# Patient Record
Sex: Male | Born: 1995 | Race: Black or African American | Hispanic: No | Marital: Single | Smoking: Current some day smoker
Health system: Southern US, Community
[De-identification: ages and names within clinical notes are randomized; demographics above are authoritative.]

## PROBLEM LIST (undated history)

## (undated) DIAGNOSIS — I639 Cerebral infarction, unspecified: Secondary | ICD-10-CM

---

## 2021-02-02 ENCOUNTER — Emergency Department (HOSPITAL_COMMUNITY)
Admission: EM | Admit: 2021-02-02 | Discharge: 2021-02-02 | Disposition: A | Discharge status: Home / Self Care | Payer: Self-pay | Attending: Emergency Medicine | Admitting: Emergency Medicine

## 2021-02-02 DIAGNOSIS — R0981 Nasal congestion: Secondary | ICD-10-CM | POA: Insufficient documentation

## 2021-02-02 DIAGNOSIS — Z20822 Contact with and (suspected) exposure to covid-19: Secondary | ICD-10-CM | POA: Insufficient documentation

## 2021-02-02 DIAGNOSIS — R059 Cough, unspecified: Secondary | ICD-10-CM | POA: Insufficient documentation

## 2021-02-02 LAB — SARS CORONAVIRUS 2 (TAT 6-24 HRS): SARS Coronavirus 2: NEGATIVE

## 2021-02-02 MED ORDER — BENZONATATE 100 MG PO CAPS: 100.0000 mg | ORAL_CAPSULE | Freq: Three times a day (TID) | ORAL | 0 refills | Status: DC

## 2021-02-02 MED ORDER — ONDANSETRON 4 MG PO TBDP: 4.0000 mg | ORAL_TABLET | Freq: Three times a day (TID) | ORAL | 0 refills | Status: DC | PRN

## 2021-02-02 NOTE — Discharge Instructions (Signed)
Please drink plenty of water.  Please follow-up on the COVID test result on the MyChart app.  You may use benzonatate for cough.  I recommend warm tea and honey.  Plenty of fluids.  I prescribed you Zofran as needed for nausea.  You may return to the ER for any new or concerning symptoms.

## 2021-02-02 NOTE — ED Provider Notes (Signed)
[facility]-EMERGENCY DEPT Provider Note   CSN: [id number] Arrival date & time: 02/02/21  0536     History Chief Complaint  Patient presents with  . Cough    Roy [name] is a 25 y.o. male.  HPI Patient is 25 year old male with no past medical history non-smoker presenting today with cough and congestion for 2 days   He denies any fevers or chills.  No hemoptysis.  He states he has some aches in his chest when he coughs.  No other associate chest pain.  No shortness of breath.  No aggravating mitigating factors.  It is not keeping up at night.  He denies any lightheadedness dizziness.  No vomiting.  Occasional nausea he states he has had decreased appetite.  This may be secondary to changes in taste.     No past medical history on file.  There are no problems to display for this patient.   The histories are not reviewed yet. Please review them in the "History" navigator section and refresh this SmartLink.     No family history on file.     Home Medications Prior to Admission medications   Medication Sig Start Date End Date Taking? Authorizing Provider  benzonatate (TESSALON) 100 MG capsule Take 1 capsule (100 mg total) by mouth every 8 (eight) hours. 02/02/21  Yes Beanca Kiester S, PA  ondansetron (ZOFRAN ODT) 4 MG disintegrating tablet Take 1 tablet (4 mg total) by mouth every 8 (eight) hours as needed for nausea or vomiting. 02/02/21  Yes Gailen Shelter, PA    Allergies    Patient has no known allergies.  Review of Systems   Review of Systems  Constitutional: Negative for chills and fever.  HENT: Negative for congestion.   Eyes: Negative for pain.  Respiratory: Positive for cough. Negative for shortness of breath.   Cardiovascular: Negative for chest pain and leg swelling.  Gastrointestinal: Positive for nausea. Negative for abdominal pain and vomiting.  Genitourinary: Negative for dysuria.  Musculoskeletal: Negative for myalgias.   Skin: Negative for rash.  Neurological: Negative for dizziness and headaches.    Physical Exam Updated Vital Signs BP (!) 143/96   Pulse 85   Temp 99.5 F (37.5 C) (Oral)   Resp 18   Ht 5\' 2"  (1.575 m)   Wt 95.3 kg   SpO2 98%   BMI 38.41 kg/m   Physical Exam Vitals and nursing note reviewed.  Constitutional:      General: He is not in acute distress.    Comments: Pleasant well-appearing 25 year old.  In no acute distress.  Sitting comfortably in bed.  Able answer questions appropriately follow commands. No increased work of breathing. Speaking in full sentences.  HENT:     Head: Normocephalic and atraumatic.     Nose: Nose normal.  Eyes:     General: No scleral icterus. Cardiovascular:     Rate and Rhythm: Normal rate and regular rhythm.     Pulses: Normal pulses.     Heart sounds: Normal heart sounds.  Pulmonary:     Effort: Pulmonary effort is normal. No respiratory distress.     Breath sounds: Normal breath sounds. No wheezing.  Abdominal:     Palpations: Abdomen is soft.     Tenderness: There is no abdominal tenderness.  Musculoskeletal:     Cervical back: Normal range of motion.     Right lower leg: No edema.     Left lower leg: No edema.  Skin:  General: Skin is warm and dry.     Capillary Refill: Capillary refill takes less than 2 seconds.  Neurological:     Mental Status: He is alert. Mental status is at baseline.  Psychiatric:        Mood and Affect: Mood normal.        Behavior: Behavior normal.     ED Results / Procedures / Treatments   Labs (all labs ordered are listed, but only abnormal results are displayed) Labs Reviewed  SARS CORONAVIRUS 2 (TAT 6-24 HRS)    EKG None  Radiology No results found.  Procedures Procedures   Medications Ordered in ED Medications - No data to display  ED Course  I have reviewed the triage vital signs and the nursing notes.  Pertinent labs & imaging results that were available during my care of the  patient were reviewed by me and considered in my medical decision making (see chart for details).    MDM Rules/Calculators/A&P                          Patient is a well-appearing 25 year old male presented today with cough.  Is been present for 2 days.  He is requesting COVID test.  We will provide him with COVID test.  Recommended Tylenol ibuprofen and prescribe benzonatate.  Return precautions given.  Patient is oxygenating well, ambulatory without difficulty.  Roy [name] was evaluated in Emergency Department on 02/02/2021 for the symptoms described in the history of present illness. He was evaluated in the context of the global COVID-19 pandemic, which necessitated consideration that the patient might be at risk for infection with the SARS-CoV-2 virus that causes COVID-19. Institutional protocols and algorithms that pertain to the evaluation of patients at risk for COVID-19 are in a state of rapid change based on information released by regulatory bodies including the CDC and federal and state organizations. These policies and algorithms were followed during the patient's care in the ED.  Final Clinical Impression(s) / ED Diagnoses Final diagnoses:  Cough    Rx / DC Orders ED Discharge Orders         Ordered    benzonatate (TESSALON) 100 MG capsule  Every 8 hours        02/02/21 0829    ondansetron (ZOFRAN ODT) 4 MG disintegrating tablet  Every 8 hours PRN        02/02/21 0829           Gailen Shelter, PA 02/02/21 1610    Sabino Donovan, MD 02/03/21 [phone]

## 2021-02-02 NOTE — ED Notes (Signed)
Educated patient on fever monitoring and medication options.

## 2021-02-02 NOTE — ED Triage Notes (Signed)
Pt c/o cough and congestion x 1 day. Denies fevers. States he wants to have a covid test.

## 2021-07-26 ENCOUNTER — Encounter (HOSPITAL_COMMUNITY): Payer: Self-pay | Admitting: Emergency Medicine

## 2021-07-26 ENCOUNTER — Emergency Department (HOSPITAL_COMMUNITY): Payer: Medicaid Other

## 2021-07-26 ENCOUNTER — Other Ambulatory Visit: Payer: Self-pay

## 2021-07-26 ENCOUNTER — Inpatient Hospital Stay (HOSPITAL_COMMUNITY)
Admission: EM | Admit: 2021-07-26 | Discharge: 2021-08-05 | DRG: 023 | Disposition: A | Discharge status: Rehabilitation Facility | Payer: Medicaid Other | Attending: Neurology | Admitting: Neurology

## 2021-07-26 DIAGNOSIS — D72829 Elevated white blood cell count, unspecified: Secondary | ICD-10-CM

## 2021-07-26 DIAGNOSIS — R29810 Facial weakness: Secondary | ICD-10-CM | POA: Diagnosis present

## 2021-07-26 DIAGNOSIS — I7774 Dissection of vertebral artery: Secondary | ICD-10-CM | POA: Diagnosis present

## 2021-07-26 DIAGNOSIS — G928 Other toxic encephalopathy: Secondary | ICD-10-CM | POA: Diagnosis present

## 2021-07-26 DIAGNOSIS — E785 Hyperlipidemia, unspecified: Secondary | ICD-10-CM | POA: Diagnosis present

## 2021-07-26 DIAGNOSIS — F17210 Nicotine dependence, cigarettes, uncomplicated: Secondary | ICD-10-CM | POA: Diagnosis present

## 2021-07-26 DIAGNOSIS — Z781 Physical restraint status: Secondary | ICD-10-CM | POA: Diagnosis not present

## 2021-07-26 DIAGNOSIS — R569 Unspecified convulsions: Secondary | ICD-10-CM | POA: Diagnosis not present

## 2021-07-26 DIAGNOSIS — D72828 Other elevated white blood cell count: Secondary | ICD-10-CM | POA: Diagnosis present

## 2021-07-26 DIAGNOSIS — K5901 Slow transit constipation: Secondary | ICD-10-CM | POA: Diagnosis not present

## 2021-07-26 DIAGNOSIS — Z0189 Encounter for other specified special examinations: Secondary | ICD-10-CM

## 2021-07-26 DIAGNOSIS — G479 Sleep disorder, unspecified: Secondary | ICD-10-CM | POA: Diagnosis not present

## 2021-07-26 DIAGNOSIS — R29728 NIHSS score 28: Secondary | ICD-10-CM | POA: Diagnosis present

## 2021-07-26 DIAGNOSIS — R7401 Elevation of levels of liver transaminase levels: Secondary | ICD-10-CM | POA: Diagnosis not present

## 2021-07-26 DIAGNOSIS — G8191 Hemiplegia, unspecified affecting right dominant side: Secondary | ICD-10-CM | POA: Diagnosis present

## 2021-07-26 DIAGNOSIS — E876 Hypokalemia: Secondary | ICD-10-CM | POA: Diagnosis present

## 2021-07-26 DIAGNOSIS — I6389 Other cerebral infarction: Secondary | ICD-10-CM | POA: Diagnosis not present

## 2021-07-26 DIAGNOSIS — J9601 Acute respiratory failure with hypoxia: Secondary | ICD-10-CM | POA: Diagnosis present

## 2021-07-26 DIAGNOSIS — F1210 Cannabis abuse, uncomplicated: Secondary | ICD-10-CM | POA: Diagnosis present

## 2021-07-26 DIAGNOSIS — H5500 Unspecified nystagmus: Secondary | ICD-10-CM | POA: Diagnosis present

## 2021-07-26 DIAGNOSIS — F32A Depression, unspecified: Secondary | ICD-10-CM | POA: Diagnosis present

## 2021-07-26 DIAGNOSIS — E7800 Pure hypercholesterolemia, unspecified: Secondary | ICD-10-CM | POA: Diagnosis not present

## 2021-07-26 DIAGNOSIS — R159 Full incontinence of feces: Secondary | ICD-10-CM | POA: Diagnosis not present

## 2021-07-26 DIAGNOSIS — I10 Essential (primary) hypertension: Secondary | ICD-10-CM | POA: Diagnosis present

## 2021-07-26 DIAGNOSIS — R2689 Other abnormalities of gait and mobility: Secondary | ICD-10-CM | POA: Diagnosis not present

## 2021-07-26 DIAGNOSIS — I651 Occlusion and stenosis of basilar artery: Secondary | ICD-10-CM | POA: Diagnosis not present

## 2021-07-26 DIAGNOSIS — I6502 Occlusion and stenosis of left vertebral artery: Secondary | ICD-10-CM | POA: Diagnosis present

## 2021-07-26 DIAGNOSIS — I6340 Cerebral infarction due to embolism of unspecified cerebral artery: Secondary | ICD-10-CM | POA: Insufficient documentation

## 2021-07-26 DIAGNOSIS — F1010 Alcohol abuse, uncomplicated: Secondary | ICD-10-CM | POA: Diagnosis present

## 2021-07-26 DIAGNOSIS — Z4659 Encounter for fitting and adjustment of other gastrointestinal appliance and device: Secondary | ICD-10-CM

## 2021-07-26 DIAGNOSIS — I63433 Cerebral infarction due to embolism of bilateral posterior cerebral arteries: Secondary | ICD-10-CM | POA: Diagnosis not present

## 2021-07-26 DIAGNOSIS — G441 Vascular headache, not elsewhere classified: Secondary | ICD-10-CM | POA: Diagnosis not present

## 2021-07-26 DIAGNOSIS — Z20822 Contact with and (suspected) exposure to covid-19: Secondary | ICD-10-CM | POA: Diagnosis present

## 2021-07-26 DIAGNOSIS — R7989 Other specified abnormal findings of blood chemistry: Secondary | ICD-10-CM | POA: Diagnosis not present

## 2021-07-26 DIAGNOSIS — R4189 Other symptoms and signs involving cognitive functions and awareness: Secondary | ICD-10-CM | POA: Diagnosis not present

## 2021-07-26 DIAGNOSIS — E871 Hypo-osmolality and hyponatremia: Secondary | ICD-10-CM | POA: Diagnosis not present

## 2021-07-26 DIAGNOSIS — I6302 Cerebral infarction due to thrombosis of basilar artery: Secondary | ICD-10-CM | POA: Diagnosis not present

## 2021-07-26 DIAGNOSIS — Z6841 Body Mass Index (BMI) 40.0 and over, adult: Secondary | ICD-10-CM

## 2021-07-26 DIAGNOSIS — G40901 Epilepsy, unspecified, not intractable, with status epilepticus: Secondary | ICD-10-CM | POA: Diagnosis not present

## 2021-07-26 DIAGNOSIS — I6350 Cerebral infarction due to unspecified occlusion or stenosis of unspecified cerebral artery: Secondary | ICD-10-CM | POA: Diagnosis not present

## 2021-07-26 DIAGNOSIS — I639 Cerebral infarction, unspecified: Secondary | ICD-10-CM

## 2021-07-26 DIAGNOSIS — G9341 Metabolic encephalopathy: Secondary | ICD-10-CM | POA: Diagnosis not present

## 2021-07-26 DIAGNOSIS — D72823 Leukemoid reaction: Secondary | ICD-10-CM | POA: Diagnosis not present

## 2021-07-26 DIAGNOSIS — R4182 Altered mental status, unspecified: Secondary | ICD-10-CM | POA: Diagnosis present

## 2021-07-26 DIAGNOSIS — I6330 Cerebral infarction due to thrombosis of unspecified cerebral artery: Secondary | ICD-10-CM

## 2021-07-26 DIAGNOSIS — I6312 Cerebral infarction due to embolism of basilar artery: Secondary | ICD-10-CM | POA: Diagnosis present

## 2021-07-26 LAB — I-STAT ARTERIAL BLOOD GAS, ED
Acid-base deficit: 4 mmol/L — ABNORMAL HIGH (ref 0.0–2.0)
Bicarbonate: 22.6 mmol/L (ref 20.0–28.0)
Calcium, Ion: 1.19 mmol/L (ref 1.15–1.40)
HCT: 39 % (ref 39.0–52.0)
Hemoglobin: 13.3 g/dL (ref 13.0–17.0)
O2 Saturation: 100 %
Patient temperature: 97.6
Potassium: 2.7 mmol/L — CL (ref 3.5–5.1)
Sodium: 141 mmol/L (ref 135–145)
TCO2: 24 mmol/L (ref 22–32)
pCO2 arterial: 43 mmHg (ref 32.0–48.0)
pH, Arterial: 7.326 — ABNORMAL LOW (ref 7.350–7.450)
pO2, Arterial: 395 mmHg — ABNORMAL HIGH (ref 83.0–108.0)

## 2021-07-26 LAB — CBG MONITORING, ED: Glucose-Capillary: 128 mg/dL — ABNORMAL HIGH (ref 70–99)

## 2021-07-26 IMAGING — CT CT HEAD W/O CM
4 series · 16 of 47 positions shown, 18 images · non-contrast
Comparison: None.

CLINICAL DATA: Seizure.

EXAM:
CT HEAD WITHOUT CONTRAST
TECHNIQUE: Contiguous axial images were obtained from the base of the skull
through the vertex without intravenous contrast.

[Series 3: head wo · axial · 0.40mm/px · z∈[-205,-95]mm · 7 of 30 slices shown, 9 images]
[im 4/30  brain]
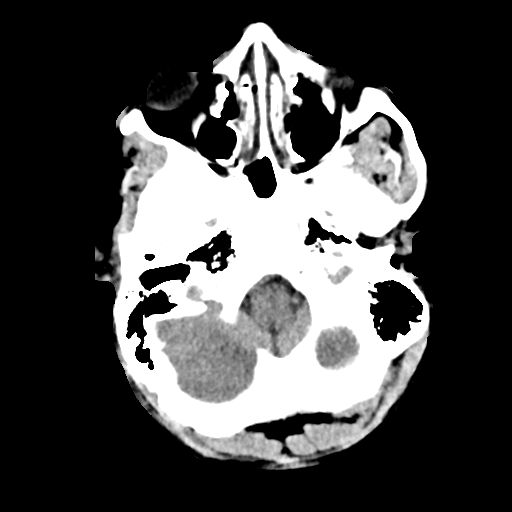
[im 4/30  bone]
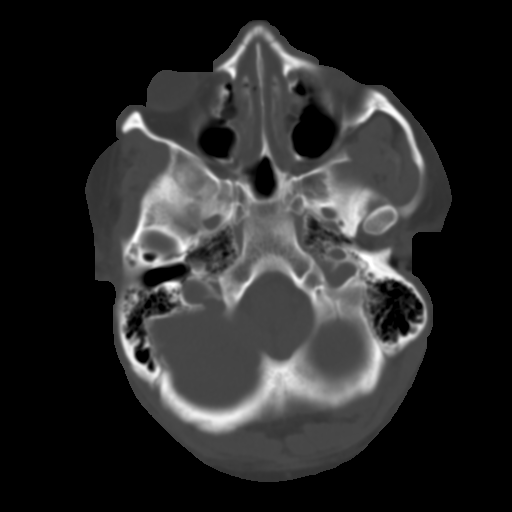
[im 8/30  brain]
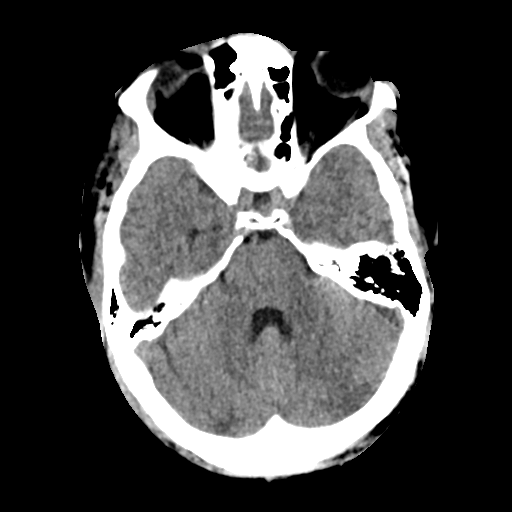
[im 11/30  brain]
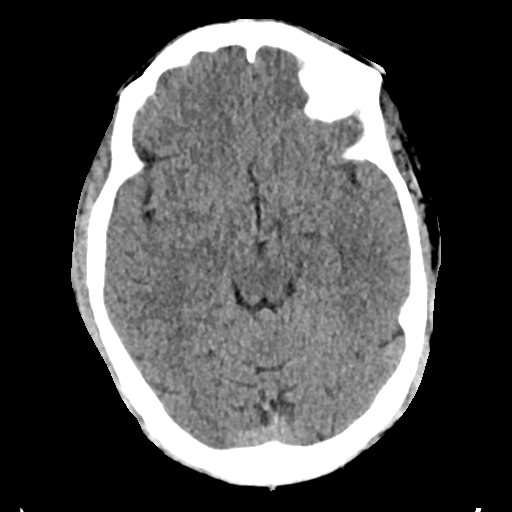
[im 15/30  brain]
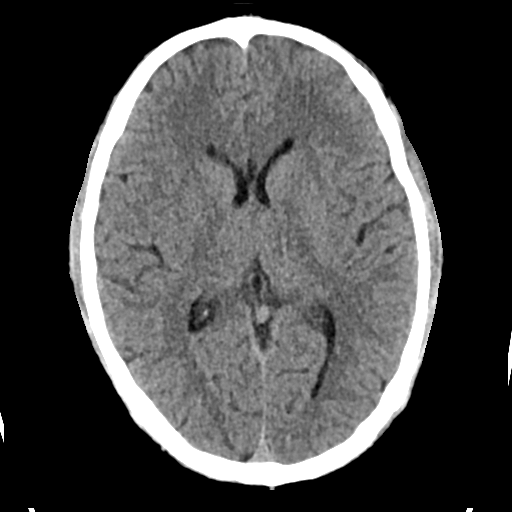
[im 19/30  brain]
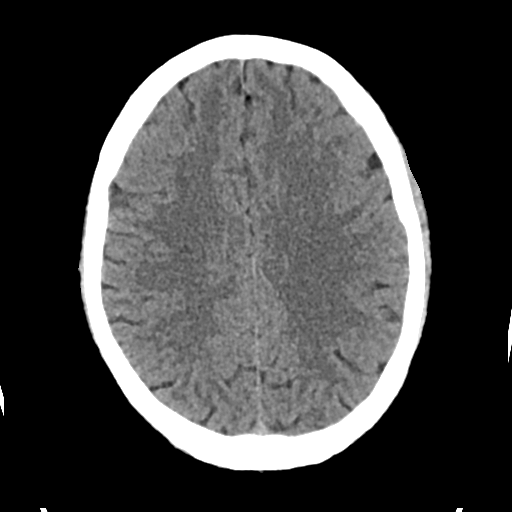
[im 19/30  bone]
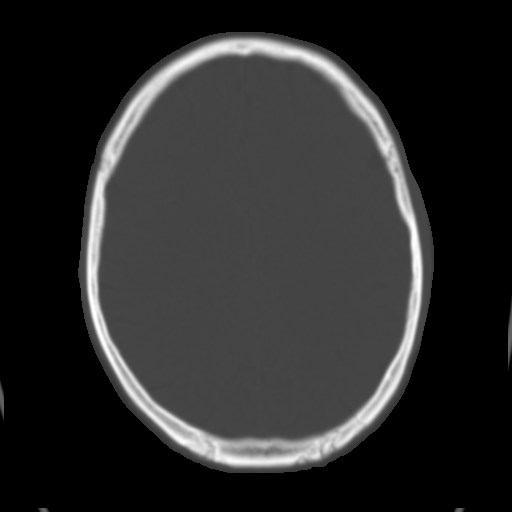
[im 22/30  brain]
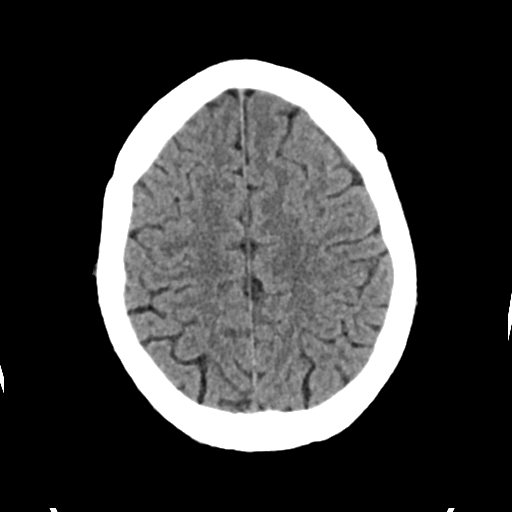
[im 26/30  brain]
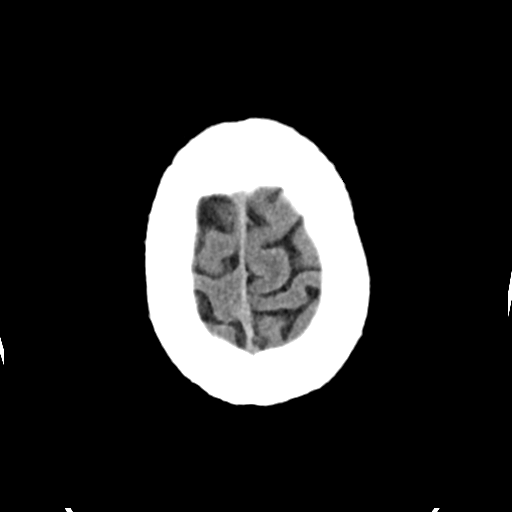

[Series 4: head bone · axial · 0.40mm/px · z∈[-206,-178]mm · 3 of 74 slices shown]
[im 8/74  bone]
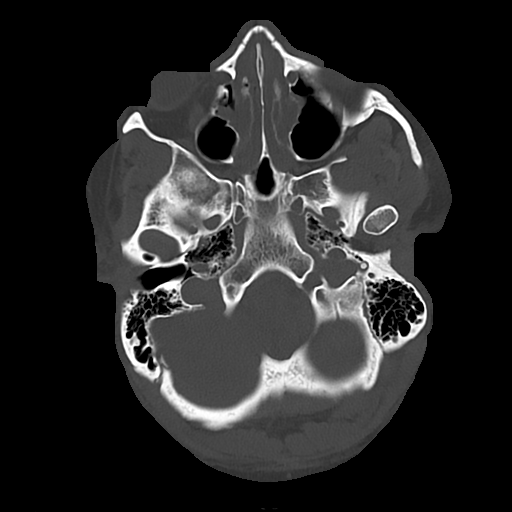
[im 15/74  bone]
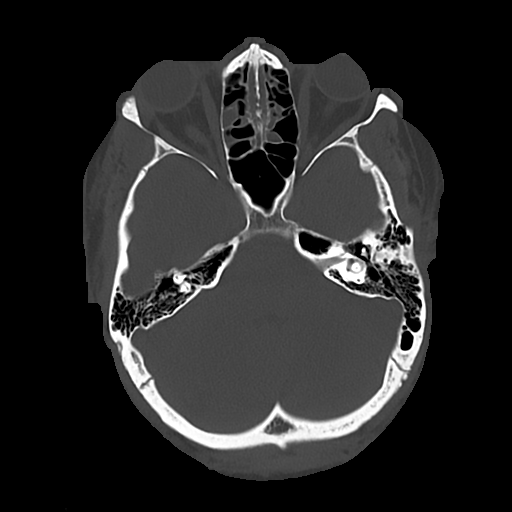
[im 22/74  bone]
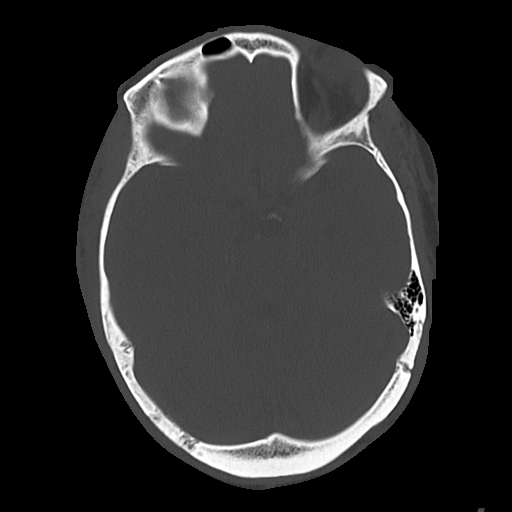

[Series 5: cor soft · coronal · 0.29mm/px · 3 of 68 slices shown]
[im 23/68  brain]
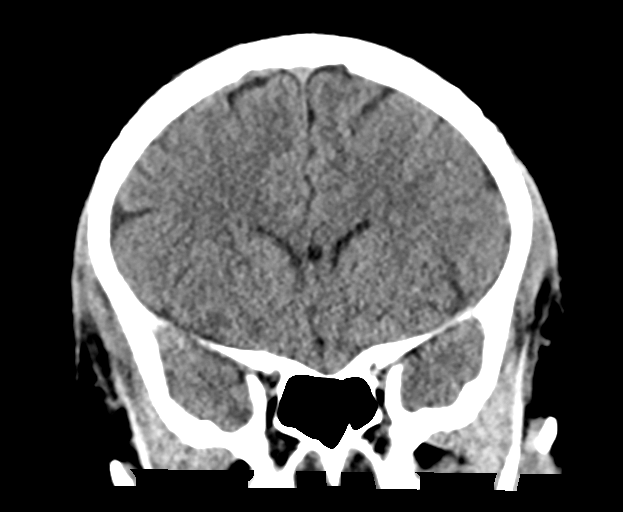
[im 30/68  brain]
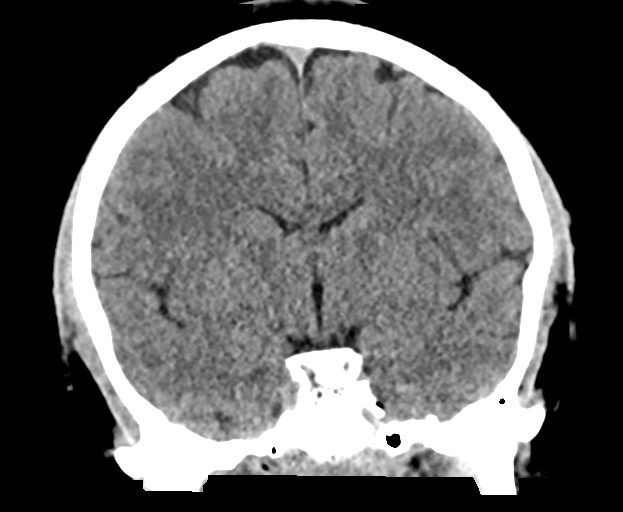
[im 38/68  brain]
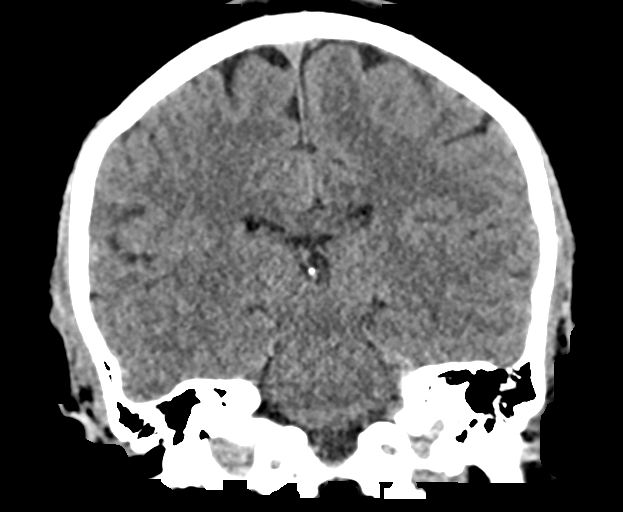

[Series 6: sag soft · sagittal · 0.29mm/px · 3 of 60 slices shown]
[im 20/60  brain]
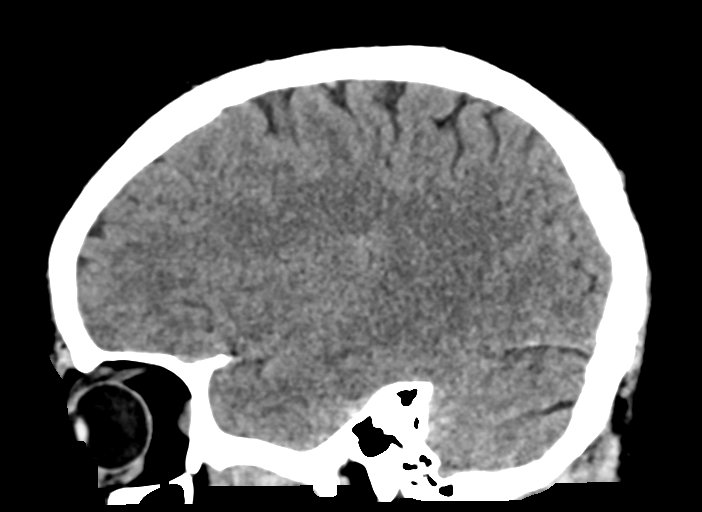
[im 30/60  brain]
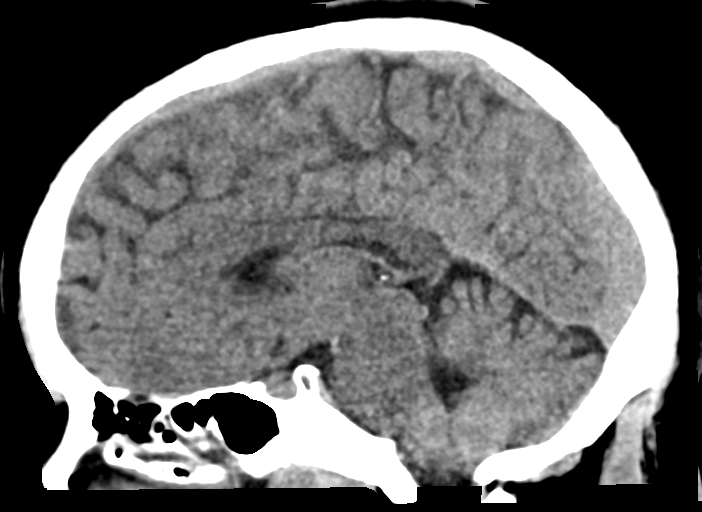
[im 40/60  brain]
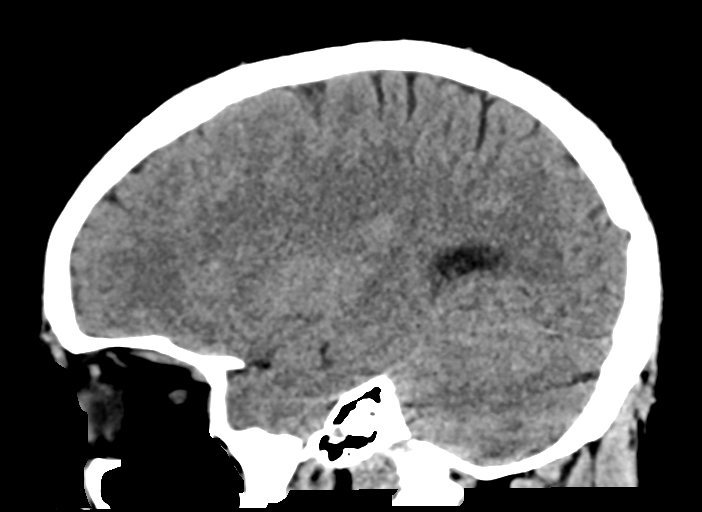

[16 of 47 positions shown; findings below may reference images not displayed]

FINDINGS: Brain: No evidence of acute infarction, hemorrhage, hydrocephalus,
extra-axial collection or mass lesion/mass effect.

Vascular: No hyperdense vessel or unexpected calcification.

Skull: Normal. Negative for fracture or focal lesion.

Sinuses/Orbits: There is mucoperiosteal thickening of paranasal
sinuses and complete opacification of the nasal passages. No
air-fluid. The mastoid air cells are clear.

Other: None
IMPRESSION: 1. Normal noncontrast CT of the brain.
2. Paranasal sinus disease.

## 2021-07-26 IMAGING — DX DG CHEST 1V PORT
1 series · 1 of 1 positions shown · non-contrast
Comparison: None.

CLINICAL DATA: Status post intubation.

EXAM:
PORTABLE CHEST 1 VIEW

[chest]
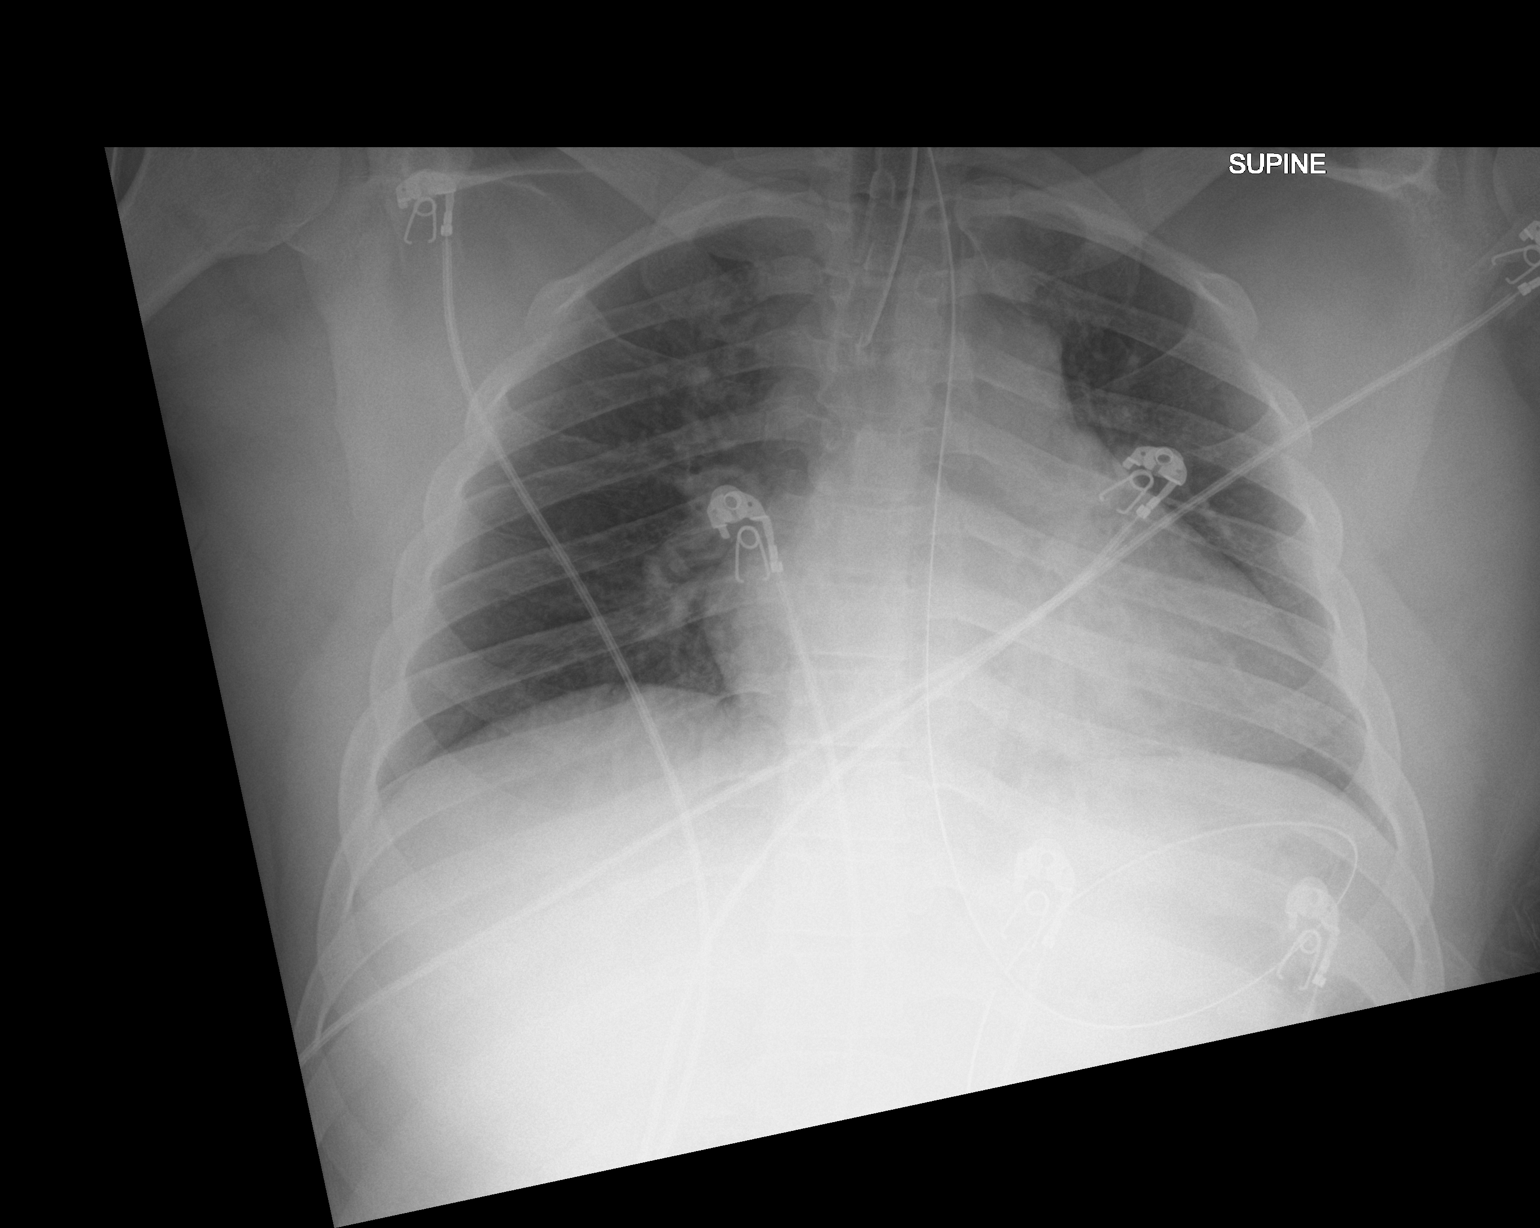

[1 of 1 positions shown; findings below may reference images not displayed]

FINDINGS: Endotracheal tube with tip approximately 2 cm above the carina.
Enteric tube extends below the diaphragm with tip beyond the
inferior margin of the image.

Mild cardiomegaly with mild central vascular congestion. No focal
consolidation, pleural effusion or pneumothorax. No acute osseous
pathology.
IMPRESSION: 1. Endotracheal tube above the carina.
2. Mild cardiomegaly with mild central vascular congestion. No focal
consolidation.

## 2021-07-26 MED ORDER — HEPARIN SODIUM (PORCINE) 5000 UNIT/ML IJ SOLN
5000.0000 [IU] | Freq: Three times a day (TID) | INTRAMUSCULAR | Status: DC
  Administered 2021-07-27 – 2021-07-31 (×13): 5000 [IU] via SUBCUTANEOUS
  Filled 2021-07-26 (×12): qty 1

## 2021-07-26 MED ORDER — LEVETIRACETAM IN NACL 1000 MG/100ML IV SOLN
1000.0000 mg | INTRAVENOUS | Status: AC
  Administered 2021-07-27: 1000 mg via INTRAVENOUS
  Filled 2021-07-26: qty 100

## 2021-07-26 MED ORDER — PANTOPRAZOLE SODIUM 40 MG IV SOLR
40.0000 mg | Freq: Every day | INTRAVENOUS | Status: DC
  Administered 2021-07-27 – 2021-07-28 (×2): 40 mg via INTRAVENOUS
  Filled 2021-07-26 (×2): qty 40

## 2021-07-26 MED ORDER — POLYETHYLENE GLYCOL 3350 17 G PO PACK: 17.0000 g | PACK | Freq: Every day | ORAL | Status: DC | PRN

## 2021-07-26 MED ORDER — LEVETIRACETAM IN NACL 1000 MG/100ML IV SOLN
1000.0000 mg | INTRAVENOUS | Status: AC
  Administered 2021-07-26: 2000 mg via INTRAVENOUS
  Filled 2021-07-26 (×2): qty 100

## 2021-07-26 MED ORDER — DOCUSATE SODIUM 50 MG/5ML PO LIQD: 100.0000 mg | Freq: Two times a day (BID) | ORAL | Status: DC | PRN

## 2021-07-26 MED ORDER — DOCUSATE SODIUM 50 MG/5ML PO LIQD
100.0000 mg | Freq: Two times a day (BID) | ORAL | Status: DC
  Administered 2021-07-27 – 2021-07-29 (×5): 100 mg
  Filled 2021-07-26 (×5): qty 10

## 2021-07-26 MED ORDER — ROCURONIUM BROMIDE 50 MG/5ML IV SOLN
INTRAVENOUS | Status: AC | PRN
  Administered 2021-07-26: 80 mg via INTRAVENOUS

## 2021-07-26 MED ORDER — POLYETHYLENE GLYCOL 3350 17 G PO PACK
17.0000 g | PACK | Freq: Every day | ORAL | Status: DC
  Administered 2021-07-27 – 2021-07-29 (×2): 17 g
  Filled 2021-07-26 (×2): qty 1

## 2021-07-26 MED ORDER — LORAZEPAM 2 MG/ML IJ SOLN
INTRAMUSCULAR | Status: AC
  Administered 2021-07-26: 2 mg
  Filled 2021-07-26: qty 2

## 2021-07-26 MED ORDER — ETOMIDATE 2 MG/ML IV SOLN
INTRAVENOUS | Status: AC | PRN
  Administered 2021-07-26: 20 mg via INTRAVENOUS

## 2021-07-26 MED ORDER — LORAZEPAM 2 MG/ML IJ SOLN
INTRAMUSCULAR | Status: AC
  Administered 2021-07-26: 2 mg
  Filled 2021-07-26: qty 1

## 2021-07-26 MED ORDER — FENTANYL 2500MCG IN NS 250ML (10MCG/ML) PREMIX INFUSION
50.0000 ug/h | INTRAVENOUS | Status: DC
  Administered 2021-07-26: 100 ug/h via INTRAVENOUS
  Administered 2021-07-28: 50 ug/h via INTRAVENOUS
  Filled 2021-07-26 (×2): qty 250

## 2021-07-26 MED ORDER — PROPOFOL 1000 MG/100ML IV EMUL
0.0000 ug/kg/min | INTRAVENOUS | Status: DC
  Administered 2021-07-26: 10 ug/kg/min via INTRAVENOUS
  Administered 2021-07-27: 50 ug/kg/min via INTRAVENOUS
  Administered 2021-07-27: 30 ug/kg/min via INTRAVENOUS
  Administered 2021-07-27: 50 ug/kg/min via INTRAVENOUS
  Administered 2021-07-28: 20 ug/kg/min via INTRAVENOUS
  Filled 2021-07-26 (×7): qty 100

## 2021-07-26 MED ORDER — LORAZEPAM 2 MG/ML IJ SOLN: 2.0000 mg | Freq: Once | INTRAMUSCULAR | Status: AC

## 2021-07-26 MED ORDER — FENTANYL BOLUS VIA INFUSION
50.0000 ug | INTRAVENOUS | Status: DC | PRN
  Filled 2021-07-26: qty 100

## 2021-07-26 MED ORDER — SODIUM CHLORIDE 0.9 % IV SOLN
INTRAVENOUS | Status: AC | PRN
  Administered 2021-07-26: 1000 mL via INTRAVENOUS

## 2021-07-26 MED ORDER — SODIUM CHLORIDE 0.9 % IV BOLUS
1000.0000 mL | Freq: Once | INTRAVENOUS | Status: AC
  Administered 2021-07-26: 1000 mL via INTRAVENOUS

## 2021-07-26 MED ORDER — SODIUM CHLORIDE 0.9 % IV SOLN: 2000.0000 mg | Freq: Once | INTRAVENOUS | Status: DC

## 2021-07-26 MED ORDER — SODIUM CHLORIDE 0.9 % IV SOLN: INTRAVENOUS | Status: DC

## 2021-07-26 MED ORDER — LORAZEPAM 2 MG/ML IJ SOLN
2.0000 mg | Freq: Once | INTRAMUSCULAR | Status: AC
  Administered 2021-07-26: 2 mg via INTRAVENOUS

## 2021-07-26 NOTE — ED Provider Notes (Signed)
[facility] EMERGENCY DEPARTMENT Provider Note   CSN: [id number] Arrival date & time: 07/26/21  2215     History Chief Complaint  Patient presents with   Altered Mental Status    Roy [name] is a 25 y.o. male.  Patient brought in by EMS.  They got a call for someone with seizure-like activity.  Was at home found in the shed in the back with smoking marijuana and drinking alcohol with his girlfriend.  She was fine.  Patient was altered EMS had seizure-like movements.  No seizure history vomited x3 according to EMS sweating profusely unable to follow commands.  They did establish an IV.  He apparently stopped on the way in so they never administered any Versed.  But then restarted upon arrival here.  Patient with very strong tonic-clonic jerking motions.  Was foaming at the mouth.  Patient was apparently fine earlier in the day according to his mother.      History reviewed. No pertinent past medical history.  There are no problems to display for this patient.   History reviewed. No pertinent surgical history.     No family history on file.     Home Medications Prior to Admission medications   Medication Sig Start Date End Date Taking? Authorizing Provider  benzonatate (TESSALON) 100 MG capsule Take 1 capsule (100 mg total) by mouth every 8 (eight) hours. 02/02/21   Gailen Shelter, PA  ondansetron (ZOFRAN ODT) 4 MG disintegrating tablet Take 1 tablet (4 mg total) by mouth every 8 (eight) hours as needed for nausea or vomiting. 02/02/21   Gailen Shelter, PA    Allergies    Patient has no known allergies.  Review of Systems   Review of Systems  Unable to perform ROS: Patient unresponsive   Physical Exam Updated Vital Signs BP 91/64 (BP Location: Right Arm)   Pulse (!) 114   Temp (!) 97 F (36.1 C) (Temporal)   Resp 20   Wt 95.3 kg   SpO2 99%   BMI 38.41 kg/m   Physical Exam Vitals and nursing note reviewed.  Constitutional:       General: He is in acute distress.     Appearance: He is well-developed. He is diaphoretic.  HENT:     Head: Normocephalic and atraumatic.  Eyes:     Conjunctiva/sclera: Conjunctivae normal.     Comments: Pupil pinpoint nonreactive  Cardiovascular:     Rate and Rhythm: Regular rhythm. Tachycardia present.     Heart sounds: No murmur heard. Pulmonary:     Effort: Pulmonary effort is normal. No respiratory distress.     Breath sounds: Normal breath sounds. No wheezing, rhonchi or rales.  Abdominal:     Palpations: Abdomen is soft.     Comments: Abdomen is soft.  Musculoskeletal:        General: No swelling.     Cervical back: Normal range of motion and neck supple.  Skin:    General: Skin is warm.  Neurological:     Comments: Patient with active tonic-clonic movement and foaming at the mouth.    ED Results / Procedures / Treatments   Labs (all labs ordered are listed, but only abnormal results are displayed) Labs Reviewed  TRIGLYCERIDES  COMPREHENSIVE METABOLIC PANEL  CBC WITH DIFFERENTIAL/PLATELET  LACTIC ACID, PLASMA  ETHANOL  RAPID URINE DRUG SCREEN, HOSP PERFORMED  URINALYSIS, ROUTINE W REFLEX MICROSCOPIC  MAGNESIUM    EKG None  Radiology No results found.  Procedures  Procedure Name: Intubation Date/Time: 07/26/2021 11:00 PM Performed by: Vanetta Mulders, MD Pre-anesthesia Checklist: Patient identified, Patient being monitored, Emergency Drugs available, Suction available and Timeout performed Oxygen Delivery Method: Ambu bag Preoxygenation: Pre-oxygenation with 100% oxygen Induction Type: Rapid sequence Ventilation: Mask ventilation without difficulty Laryngoscope Size: Glidescope and 4 Tube size: 7.5 mm Airway Equipment and Method: Rigid stylet Placement Confirmation: ETT inserted through vocal cords under direct vision, CO2 detector and Breath sounds checked- equal and bilateral Secured at: 23 cm Tube secured with: ETT holder Comments: Intubation  without any difficulty      Medications Ordered in ED Medications  levETIRAcetam (KEPPRA) IVPB 1000 mg/100 mL premix (1,000 mg Intravenous Not Given 07/26/21 2248)  docusate (COLACE) 50 MG/5ML liquid 100 mg (has no administration in time range)  polyethylene glycol (MIRALAX / GLYCOLAX) packet 17 g (has no administration in time range)  fentaNYL in NS (4mcg/ml) infusion-PREMIX (has no administration in time range)  fentaNYL (SUBLIMAZE) bolus via infusion 50-100 mcg (has no administration in time range)  propofol (DIPRIVAN) 1000 MG/100ML infusion (has no administration in time range)  0.9 %  sodium chloride infusion (has no administration in time range)  sodium chloride 0.9 % bolus 1,000 mL (has no administration in time range)  LORazepam (ATIVAN) 2 MG/ML injection (2 mg  Given 07/26/21 2225)  LORazepam (ATIVAN) injection 2 mg (2 mg Intravenous Given 07/26/21 2226)  0.9 %  sodium chloride infusion (1,000 mLs Intravenous New Bag/Given 07/26/21 2245)  etomidate (AMIDATE) injection (20 mg Intravenous Given 07/26/21 2245)  rocuronium (ZEMURON) injection (80 mg Intravenous Given 07/26/21 2246)    ED Course  I have reviewed the triage vital signs and the nursing notes.  Pertinent labs & imaging results that were available during my care of the patient were reviewed by me and considered in my medical decision making (see chart for details).    MDM Rules/Calculators/A&P                         CRITICAL CARE Performed by: Vanetta Mulders Total critical care time: 60 minutes Critical care time was exclusive of separately billable procedures and treating other patients. Critical care was necessary to treat or prevent imminent or life-threatening deterioration. Critical care was time spent personally by me on the following activities: development of treatment plan with patient and/or surrogate as well as nursing, discussions with consultants, evaluation of patient's response to  treatment, examination of patient, obtaining history from patient or surrogate, ordering and performing treatments and interventions, ordering and review of laboratory studies, ordering and review of radiographic studies, pulse oximetry and re-evaluation of patient's condition.   Initially patient's movements were quite violent.  He got 4 mg of Ativan did not make much difference.  Blood sugars were fine according to EMS.  Patient then went on and received 2 g of Keppra.  Which slowed the movements down but did not stop him.  So he was intubated with rocuronium and etomidate.  And then started on fentanyl propofol drip.  Patient will go over and get head CT.  Discussed with critical care will see him for admission.  Also discussed with the on-call neuro hospitalist.  And they will see the patient as well.  Chronic and an EEG.  Blood gas here are reassuring.  Blood sugar is in the rest of labs are still pending.   Final Clinical Impression(s) / ED Diagnoses Final diagnoses:  Status epilepticus (HCC)    Rx /  DC Orders ED Discharge Orders     None        Vanetta Mulders, MD 07/26/21 2333

## 2021-07-26 NOTE — ED Notes (Signed)
Pt attempting to get out of bed, not following commands, non-violent restraints applied.

## 2021-07-26 NOTE — H&P (Addendum)
NAME:  Roy [name], MRN:  [id number], DOB:  07/20/1996, LOS: 0 ADMISSION DATE:  07/26/2021 CONSULTATION DATE:  07/26/2021 REFERRING MD:  Deretha Emory - EDP CHIEF COMPLAINT:  AMS, ?Seizure   History of Present Illness:  25 year old man who presented to [facility] ED via [facility] 11/7 for AMS. No significant PMHx. Per chart review, patient was found altered in his shed at home and movements concerning for seizure activity were noted. Patient vomited x 3 en route with EMS. Active EtOH and THC use per family. No known history of seizure activity.  On ED arrival, patient was reportedly sweating profusely and unable to follow commands. Vitals were notable for tachycardia (117) and hypotension (91/64); SpO2 99% on RA. CBG 128. Given persistent AMS and inability to protect airway, patient was intubated in ED. CT Head was completed with NAICA. Neuro was consulted for STAT EEG and evaluation. Keppra load completed and Ativan 4mg  total administered.  PCCM consulted for admission.  Pertinent Medical History:  EtOH, marijuana use  Significant Hospital Events: Including procedures, antibiotic start and stop dates in addition to other pertinent events   11/7 - Presented to [facility] ED via [facility] for AMS. Found altered in shed at home. C/f seizure activity, vomited x 3 en route. Persistently altered/combative in ED requiring restraints and intubation for airway protection.  Interim History / Subjective:  PCCM consulted for admission  Objective:  Blood pressure 91/64, pulse (!) 114, temperature (!) 97 F (36.1 C), temperature source Temporal, resp. rate 20, weight 95.3 kg, SpO2 99 %.    Vent Mode: PRVC FiO2 (%):  [100 %] 100 % Set Rate:  [18 bmp] 18 bmp Vt Set:  [500 mL] 500 mL PEEP:  [5 cmH20] 5 cmH20 Plateau Pressure:  [20 cmH20] 20 cmH20  No intake or output data in the 24 hours ending 07/26/21 2312 Filed Weights   07/26/21 2245  Weight: 95.3 kg   Physical Examination: General: Acutely ill-appearing young  adult man in NAD. HEENT: [place]/AT, anicteric sclera, mildly injected. Pupils equal and pinpoint 58mm, sluggishly reactive (sedated, s/p intubation meds), moist mucous membranes. ETT in place. Neuro: Sedated. Does not respond to verbal, tactile or noxious stimuli. Not following commands. Spontaneous movement of extremities noted prior to sedation, none noted on my exam. CV: Mildly tachycardic to 110s, regular rhythm, no m/g/r. PULM: Breathing even and unlabored on vent (PEEP 5, FiO2 100%). Lung fields CTAB anteriorly. GI: Obese, soft, nontender, nondistended. Normoactive bowel sounds. Extremities: No LE edema noted. Skin: Warm/dry, no rashes.  Resolved Hospital Problem List:    Assessment & Plan:  Acute toxic encephalopathy, presumed 2/2 substance use Found altered at home without clear etiology; per family EtOH and THC use. - Admit to ICU for close monitoring - Correct underlying metabolic derangements as able - Thiamine, MV - F/u pending labs including standard CBC, CMP, Mg - F/u UDS, Ethanol level - F/u LA  Concern for seizure activity Concern for seizure activity noted en route with EMS; per ED RN no rhythmic motion noted but described as "spastic" movements and "foaming at the mouth". No history of seizures. CT Head 11/8 NAICA. Neuro exam difficult to obtain 11/7PM in the setting of sedating/paralytic medications for intubation. - Neuro consulted, appreciate assistance - AEDs per Neuro, s/p Keppra load - Ativan PRN - STAT EEG - Additional EEG/LTM/imaging per Neuro, if indicated  Acute hypoxemic respiratory failure secondary to encephalopathy - Continue full vent support (4-8cc/kg IBW) - Wean FiO2 for O2 sat > 90% - Daily WUA/SBT -  VAP bundle - Pulmonary hygiene - PAD protocol for sedation: Propofol and Fentanyl for goal RASS 0 to -1  Hypokalemia K 2.7 on iSTAT. 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 - Encourage cessation when appropriate from clinical standpoint  Best Practice: (right click and "Reselect all SmartList Selections" daily)   Diet/type: NPO w/ meds via tube DVT prophylaxis: prophylactic heparin  GI prophylaxis: PPI Lines: N/A Foley:  N/A Code Status:  full code Last date of multidisciplinary goals of care discussion [Pending]  Labs:  CBC: No results for input(s): WBC, NEUTROABS, HGB, HCT, MCV, PLT in the last 168 hours.  Basic Metabolic Panel: No results for input(s): NA, K, CL, CO2, GLUCOSE, BUN, CREATININE, CALCIUM, MG, PHOS in the last 168 hours. GFR: CrCl cannot be calculated (No successful lab value found.). No results for input(s): PROCALCITON, WBC, LATICACIDVEN in the last 168 hours.  Liver Function Tests: No results for input(s): AST, ALT, ALKPHOS, BILITOT, PROT, ALBUMIN in the last 168 hours. No results for input(s): LIPASE, AMYLASE in the last 168 hours. No results for input(s): AMMONIA in the last 168 hours.  ABG: No results found for: PHART, PCO2ART, PO2ART, HCO3, TCO2, ACIDBASEDEF, O2SAT   Coagulation Profile: No results for input(s): INR, PROTIME in the last 168 hours.  Cardiac Enzymes: No results for input(s): CKTOTAL, CKMB, CKMBINDEX, TROPONINI in the last 168 hours.  HbA1C: No results found for: HGBA1C  CBG: Recent Labs  Lab 07/26/21 2257  GLUCAP 128*   Review of Systems:   Patient is encephalopathic and/or intubated. Therefore history has been obtained from chart review.   Past Medical History:  He,  has no past medical history on file.   Surgical History:  History reviewed. No pertinent surgical history.   Social History:    +EtOH, THC use  Family History:  His family history is not on file.   Allergies: No Known Allergies   Home Medications: Prior to Admission medications   Medication Sig Start Date End Date Taking? Authorizing Provider  benzonatate (TESSALON) 100 MG capsule Take 1  capsule (100 mg total) by mouth every 8 (eight) hours. 02/02/21   Gailen Shelter, PA  ondansetron (ZOFRAN ODT) 4 MG disintegrating tablet Take 1 tablet (4 mg total) by mouth every 8 (eight) hours as needed for nausea or vomiting. 02/02/21   Gailen Shelter, PA     Critical care time: 42 minutes   Tim Lair, [place] [facility] 07/26/21 11:12 PM  Please see [email or web address] for pager details.  From 7A-7P if no response, please call [phone] After hours, please call ELink [phone]

## 2021-07-26 NOTE — ED Triage Notes (Signed)
Pt BIB [facility] from home, pt found altered in his shed, pt altered, per EMS had seizure like movements. No known seizure hx, vomited x 3 with EMS, pt sweating profusely, unable to follow commands, family reports pt had ETOH and weed tonight.

## 2021-07-27 ENCOUNTER — Inpatient Hospital Stay (HOSPITAL_COMMUNITY): Payer: Medicaid Other

## 2021-07-27 ENCOUNTER — Inpatient Hospital Stay (HOSPITAL_COMMUNITY): Payer: Medicaid Other | Admitting: Certified Registered"

## 2021-07-27 ENCOUNTER — Encounter (HOSPITAL_COMMUNITY)
Admission: EM | Disposition: A | Discharge status: Rehabilitation Facility | Payer: Self-pay | Source: Home / Self Care | Attending: Neurology

## 2021-07-27 DIAGNOSIS — I6330 Cerebral infarction due to thrombosis of unspecified cerebral artery: Secondary | ICD-10-CM | POA: Insufficient documentation

## 2021-07-27 DIAGNOSIS — G40901 Epilepsy, unspecified, not intractable, with status epilepticus: Secondary | ICD-10-CM

## 2021-07-27 DIAGNOSIS — I651 Occlusion and stenosis of basilar artery: Secondary | ICD-10-CM

## 2021-07-27 DIAGNOSIS — J9601 Acute respiratory failure with hypoxia: Secondary | ICD-10-CM

## 2021-07-27 DIAGNOSIS — I63433 Cerebral infarction due to embolism of bilateral posterior cerebral arteries: Secondary | ICD-10-CM

## 2021-07-27 DIAGNOSIS — I7774 Dissection of vertebral artery: Secondary | ICD-10-CM

## 2021-07-27 DIAGNOSIS — I6389 Other cerebral infarction: Secondary | ICD-10-CM

## 2021-07-27 DIAGNOSIS — R569 Unspecified convulsions: Secondary | ICD-10-CM

## 2021-07-27 DIAGNOSIS — I6340 Cerebral infarction due to embolism of unspecified cerebral artery: Secondary | ICD-10-CM | POA: Insufficient documentation

## 2021-07-27 HISTORY — PX: IR INTRA CRAN STENT: IMG2345

## 2021-07-27 HISTORY — PX: IR PERCUTANEOUS ART THROMBECTOMY/INFUSION INTRACRANIAL INC DIAG ANGIO: IMG6087

## 2021-07-27 HISTORY — PX: IR CT HEAD LTD: IMG2386

## 2021-07-27 HISTORY — PX: RADIOLOGY WITH ANESTHESIA: SHX6223

## 2021-07-27 HISTORY — PX: IR ANGIO EXTRACRAN SEL COM CAROTID INNOMINATE UNI R MOD SED: IMG5356

## 2021-07-27 LAB — COMPREHENSIVE METABOLIC PANEL
ALT: 38 U/L (ref 0–44)
AST: 38 U/L (ref 15–41)
Albumin: 3.6 g/dL (ref 3.5–5.0)
Alkaline Phosphatase: 59 U/L (ref 38–126)
Anion gap: 14 (ref 5–15)
BUN: 11 mg/dL (ref 6–20)
CO2: 18 mmol/L — ABNORMAL LOW (ref 22–32)
Calcium: 8.5 mg/dL — ABNORMAL LOW (ref 8.9–10.3)
Chloride: 107 mmol/L (ref 98–111)
Creatinine, Ser: 1.13 mg/dL (ref 0.61–1.24)
GFR, Estimated: >60 mL/min (ref >60)
Glucose, Bld: 112 mg/dL — ABNORMAL HIGH (ref 70–99)
Potassium: 3 mmol/L — ABNORMAL LOW (ref 3.5–5.1)
Sodium: 139 mmol/L (ref 135–145)
Total Bilirubin: 0.6 mg/dL (ref 0.3–1.2)
Total Protein: 6.7 g/dL (ref 6.5–8.1)

## 2021-07-27 LAB — CBC
HCT: 34.6 % — ABNORMAL LOW (ref 39.0–52.0)
Hemoglobin: 11.9 g/dL — ABNORMAL LOW (ref 13.0–17.0)
MCH: 30.7 pg (ref 26.0–34.0)
MCHC: 34.4 g/dL (ref 30.0–36.0)
MCV: 89.4 fL (ref 80.0–100.0)
Platelets: 319 10*3/uL (ref 150–400)
RBC: 3.87 MIL/uL — ABNORMAL LOW (ref 4.22–5.81)
RDW: 13.2 % (ref 11.5–15.5)
WBC: 13.9 10*3/uL — ABNORMAL HIGH (ref 4.0–10.5)
nRBC: 0 % (ref 0.0–0.2)

## 2021-07-27 LAB — URINALYSIS, ROUTINE W REFLEX MICROSCOPIC
Bilirubin Urine: NEGATIVE
Glucose, UA: NEGATIVE mg/dL
Hgb urine dipstick: NEGATIVE
Ketones, ur: NEGATIVE mg/dL
Leukocytes,Ua: NEGATIVE
Nitrite: NEGATIVE
Protein, ur: NEGATIVE mg/dL
Specific Gravity, Urine: 1.045 — ABNORMAL HIGH (ref 1.005–1.030)
pH: 5 (ref 5.0–8.0)

## 2021-07-27 LAB — I-STAT CHEM 8, ED
BUN: 11 mg/dL (ref 6–20)
Calcium, Ion: 0.88 mmol/L — CL (ref 1.15–1.40)
Chloride: 110 mmol/L (ref 98–111)
Creatinine, Ser: 1 mg/dL (ref 0.61–1.24)
Glucose, Bld: 115 mg/dL — ABNORMAL HIGH (ref 70–99)
HCT: 40 % (ref 39.0–52.0)
Hemoglobin: 13.6 g/dL (ref 13.0–17.0)
Potassium: 2.9 mmol/L — ABNORMAL LOW (ref 3.5–5.1)
Sodium: 140 mmol/L (ref 135–145)
TCO2: 18 mmol/L — ABNORMAL LOW (ref 22–32)

## 2021-07-27 LAB — POCT I-STAT 7, (LYTES, BLD GAS, ICA,H+H)
Acid-base deficit: 2 mmol/L (ref 0.0–2.0)
Bicarbonate: 22.9 mmol/L (ref 20.0–28.0)
Calcium, Ion: 1.14 mmol/L — ABNORMAL LOW (ref 1.15–1.40)
HCT: 35 % — ABNORMAL LOW (ref 39.0–52.0)
Hemoglobin: 11.9 g/dL — ABNORMAL LOW (ref 13.0–17.0)
O2 Saturation: 97 %
Patient temperature: 99.1
Potassium: 3.7 mmol/L (ref 3.5–5.1)
Sodium: 140 mmol/L (ref 135–145)
TCO2: 24 mmol/L (ref 22–32)
pCO2 arterial: 39.7 mmHg (ref 32.0–48.0)
pH, Arterial: 7.37 (ref 7.350–7.450)
pO2, Arterial: 96 mmHg (ref 83.0–108.0)

## 2021-07-27 LAB — BASIC METABOLIC PANEL
Anion gap: 9 (ref 5–15)
BUN: 9 mg/dL (ref 6–20)
CO2: 20 mmol/L — ABNORMAL LOW (ref 22–32)
Calcium: 8.1 mg/dL — ABNORMAL LOW (ref 8.9–10.3)
Chloride: 109 mmol/L (ref 98–111)
Creatinine, Ser: 1 mg/dL (ref 0.61–1.24)
GFR, Estimated: >60 mL/min (ref >60)
Glucose, Bld: 103 mg/dL — ABNORMAL HIGH (ref 70–99)
Potassium: 3.8 mmol/L (ref 3.5–5.1)
Sodium: 138 mmol/L (ref 135–145)

## 2021-07-27 LAB — CBC WITH DIFFERENTIAL/PLATELET
Abs Immature Granulocytes: 0.09 10*3/uL — ABNORMAL HIGH (ref 0.00–0.07)
Basophils Absolute: 0.1 10*3/uL (ref 0.0–0.1)
Basophils Relative: 0 %
Eosinophils Absolute: 0 10*3/uL (ref 0.0–0.5)
Eosinophils Relative: 0 %
HCT: 39.9 % (ref 39.0–52.0)
Hemoglobin: 13.2 g/dL (ref 13.0–17.0)
Immature Granulocytes: 1 %
Lymphocytes Relative: 12 %
Lymphs Abs: 2.3 10*3/uL (ref 0.7–4.0)
MCH: 30.4 pg (ref 26.0–34.0)
MCHC: 33.1 g/dL (ref 30.0–36.0)
MCV: 91.9 fL (ref 80.0–100.0)
Monocytes Absolute: 1.4 10*3/uL — ABNORMAL HIGH (ref 0.1–1.0)
Monocytes Relative: 8 %
Neutro Abs: 15 10*3/uL — ABNORMAL HIGH (ref 1.7–7.7)
Neutrophils Relative %: 79 %
Platelets: 179 10*3/uL (ref 150–400)
RBC: 4.34 MIL/uL (ref 4.22–5.81)
RDW: 13 % (ref 11.5–15.5)
WBC: 19 10*3/uL — ABNORMAL HIGH (ref 4.0–10.5)
nRBC: 0 % (ref 0.0–0.2)

## 2021-07-27 LAB — RAPID URINE DRUG SCREEN, HOSP PERFORMED
Amphetamines: NOT DETECTED
Barbiturates: NOT DETECTED
Benzodiazepines: NOT DETECTED
Cocaine: NOT DETECTED
Opiates: NOT DETECTED
Tetrahydrocannabinol: POSITIVE — AB

## 2021-07-27 LAB — PHOSPHORUS: Phosphorus: 4.1 mg/dL (ref 2.5–4.6)

## 2021-07-27 LAB — ECHOCARDIOGRAM COMPLETE
AR max vel: 2.04 cm2
AV Area VTI: 2.09 cm2
AV Area mean vel: 2.18 cm2
AV Mean grad: 4 mmHg
AV Peak grad: 8.1 mmHg
Ao pk vel: 1.42 m/s
Height: 62 in
S' Lateral: 2.9 cm
Weight: 3360 oz

## 2021-07-27 LAB — ETHANOL: Alcohol, Ethyl (B): <10 mg/dL (ref <10)

## 2021-07-27 LAB — HEMOGLOBIN A1C
Hgb A1c MFr Bld: 5.7 % — ABNORMAL HIGH (ref 4.8–5.6)
Mean Plasma Glucose: 116.89 mg/dL

## 2021-07-27 LAB — AMMONIA: Ammonia: 58 umol/L — ABNORMAL HIGH (ref 9–35)

## 2021-07-27 LAB — LACTIC ACID, PLASMA
Lactic Acid, Venous: 4.4 mmol/L (ref 0.5–1.9)
Lactic Acid, Venous: 2.8 mmol/L (ref 0.5–1.9)

## 2021-07-27 LAB — GLUCOSE, CAPILLARY
Glucose-Capillary: 87 mg/dL (ref 70–99)
Glucose-Capillary: 97 mg/dL (ref 70–99)
Glucose-Capillary: 88 mg/dL (ref 70–99)
Glucose-Capillary: 80 mg/dL (ref 70–99)
Glucose-Capillary: 101 mg/dL — ABNORMAL HIGH (ref 70–99)

## 2021-07-27 LAB — MRSA NEXT GEN BY PCR, NASAL: MRSA by PCR Next Gen: NOT DETECTED

## 2021-07-27 LAB — FOLATE: Folate: 9.8 ng/mL (ref >5.9)

## 2021-07-27 LAB — TSH: TSH: 0.756 u[IU]/mL (ref 0.350–4.500)

## 2021-07-27 LAB — RESP PANEL BY RT-PCR (FLU A&B, COVID) ARPGX2
Influenza A by PCR: NEGATIVE
Influenza B by PCR: NEGATIVE
SARS Coronavirus 2 by RT PCR: NEGATIVE

## 2021-07-27 LAB — TRIGLYCERIDES: Triglycerides: 151 mg/dL — ABNORMAL HIGH (ref <150)

## 2021-07-27 LAB — RPR: RPR Ser Ql: NONREACTIVE

## 2021-07-27 LAB — MAGNESIUM
Magnesium: 1.7 mg/dL (ref 1.7–2.4)
Magnesium: 1.9 mg/dL (ref 1.7–2.4)

## 2021-07-27 LAB — HIV ANTIBODY (ROUTINE TESTING W REFLEX): HIV Screen 4th Generation wRfx: NONREACTIVE

## 2021-07-27 LAB — VITAMIN B12: Vitamin B-12: 288 pg/mL (ref 180–914)

## 2021-07-27 IMAGING — DX DG CHEST 1V PORT
1 series · 1 of 1 positions shown · non-contrast
Comparison: Chest radiograph [DATE]

CLINICAL DATA: Encounter for intubation

EXAM:
PORTABLE CHEST 1 VIEW

[chest]
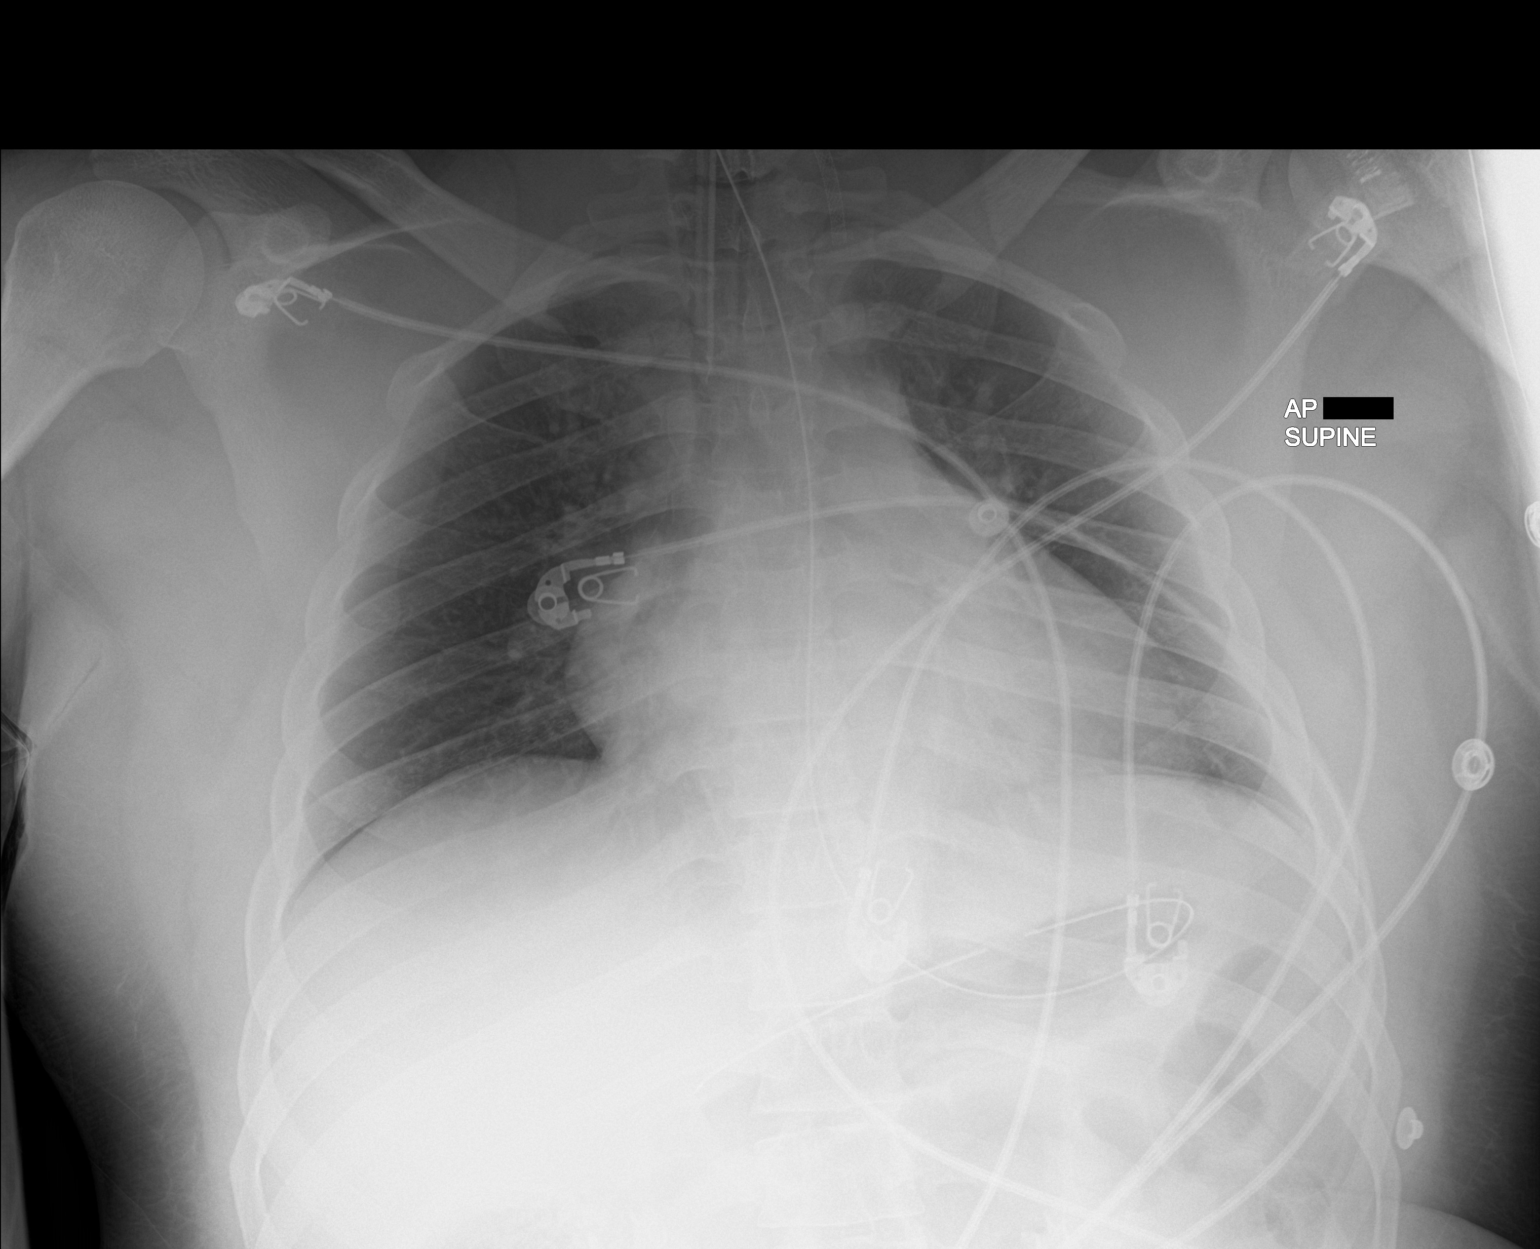

[1 of 1 positions shown; findings below may reference images not displayed]

FINDINGS: Endotracheal tube overlies the midthoracic trachea. Enteric tube tip
and side port overlie the stomach. There is a new left neck stent.
Unchanged cardiomediastinal silhouette. No focal airspace
consolidation. No large pleural effusion or visible pneumothorax.
There is no acute osseous abnormality.
IMPRESSION: Endotracheal tube overlies the midthoracic trachea.

No new airspace disease.

## 2021-07-27 IMAGING — CT CT ANGIO HEAD
2 of 7 series · 8 of 33 positions shown · IV contrast (omnipaque)
Comparison: Prior noncontrast head CT from [DATE]./

CLINICAL DATA: Initial evaluation for acute altered mental status,
evaluate for basilar thrombus.

EXAM:
CT ANGIOGRAPHY HEAD AND NECK WITH CONTRAST
CT VENOGRAM HEAD WITH CONTRAST
TECHNIQUE: Multidetector CT imaging of the head and neck was performed using
the standard protocol during bolus administration of intravenous
contrast. Multiplanar CT image reconstructions and MIPs were
obtained to evaluate the vascular anatomy. Carotid stenosis
measurements (when applicable) are obtained utilizing NASCET
criteria, using the distal internal carotid diameter as the
denominator.
CONTRAST:  60mL OMNIPAQUE IOHEXOL 350 MG/ML SOLN

[Series 3: cta neck · axial · 0.48mm/px · z∈[+288,+402]mm · 2 of 172 slices shown]
[im 58/172  soft-tissue]
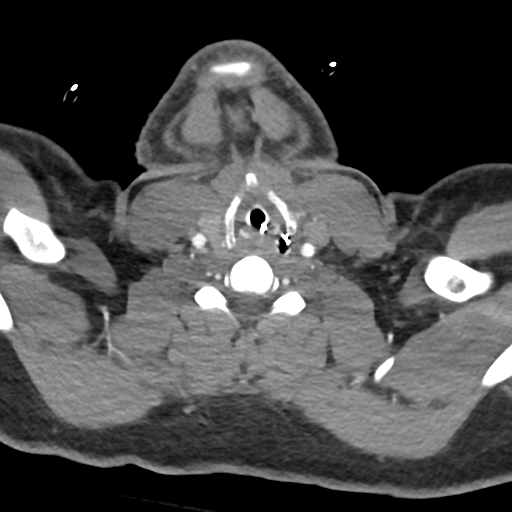
[im 115/172  soft-tissue]
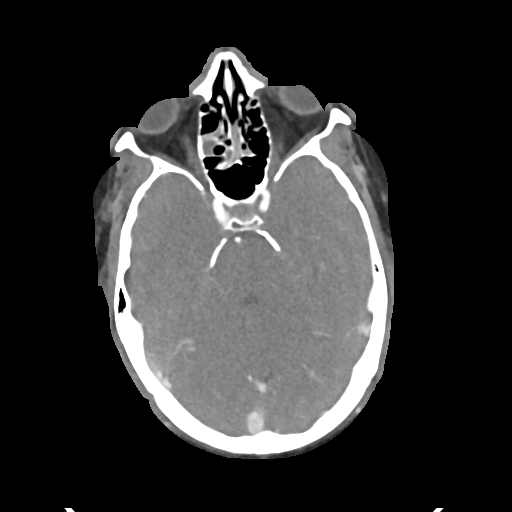

[Series 5: cta neck axial · axial · 0.39mm/px · z∈[+209,+453]mm · 6 of 343 slices shown]
[im 49/343  soft-tissue]
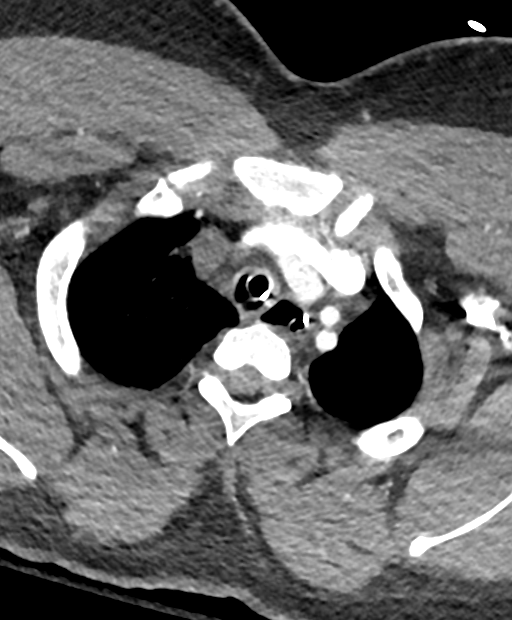
[im 98/343  bone]
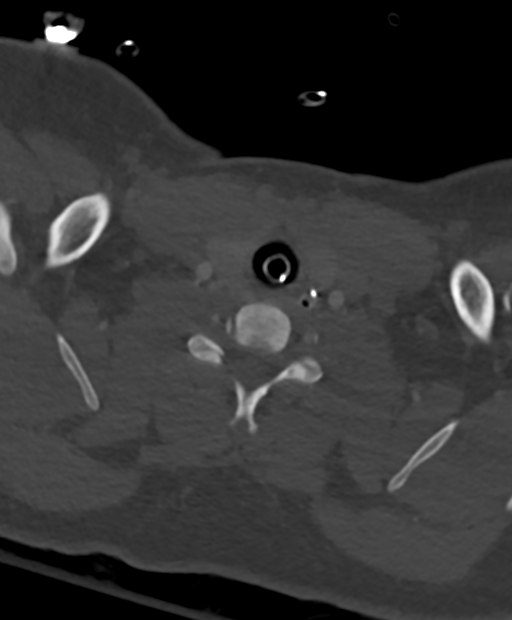
[im 147/343  soft-tissue]
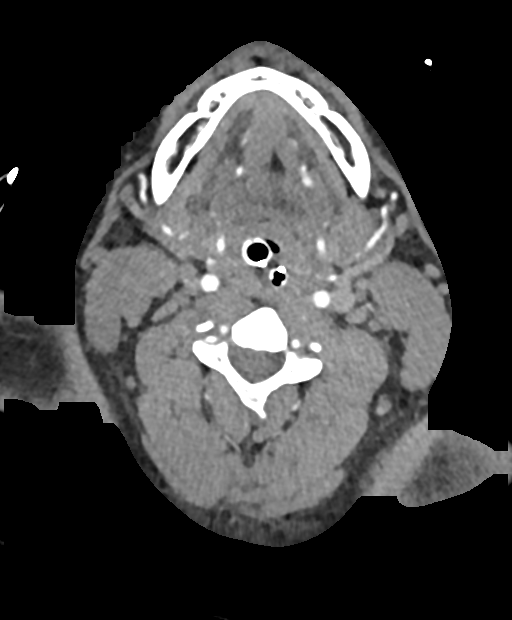
[im 196/343  bone]
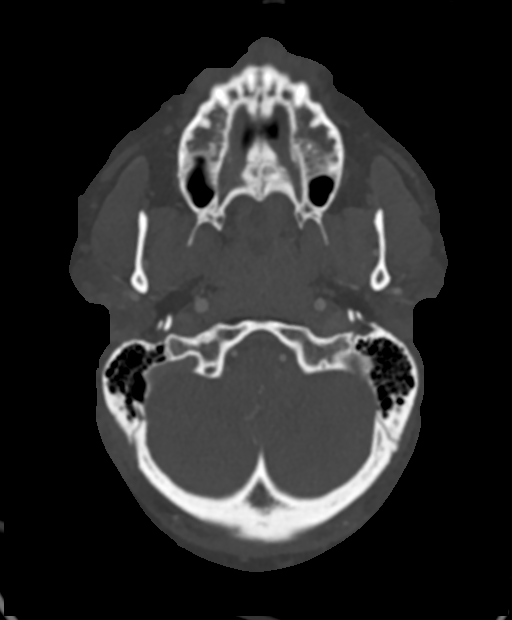
[im 245/343  soft-tissue]
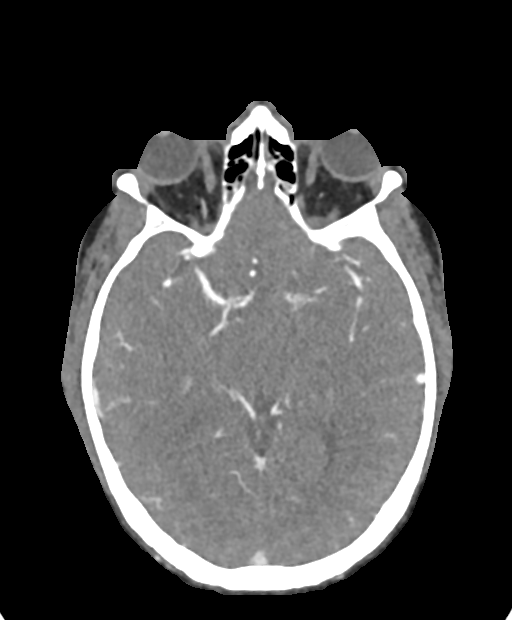
[im 294/343  bone]
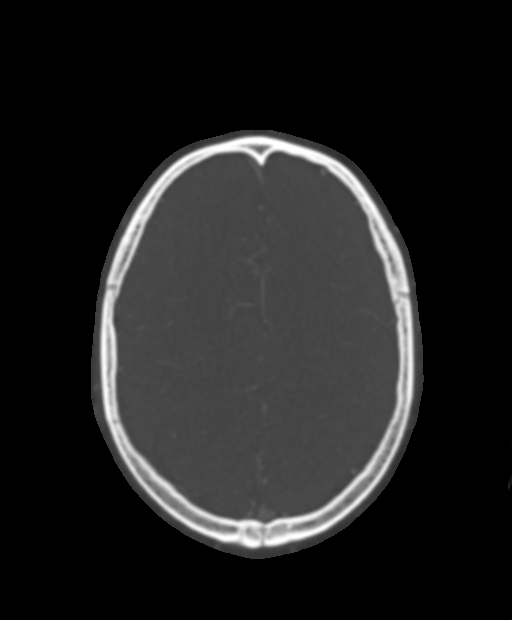

[8 of 33 positions shown; findings below may reference images not displayed]

FINDINGS: CTA NECK FINDINGS

Aortic arch: Visualized aortic arch normal in caliber with normal
branch pattern. No stenosis or other abnormality about the origin of
the great vessels.

Right carotid system: Right common and internal carotid arteries
widely patent without stenosis, dissection or occlusion.

Left carotid system: Left common and internal carotid arteries
widely patent without stenosis, dissection or occlusion.

Vertebral arteries: Both vertebral arteries arise from the
subclavian arteries. No proximal subclavian artery stenosis. Right
vertebral artery widely patent within the neck without stenosis or
other abnormality. On the left, there is intimal irregularity with
intraluminal filling defect involving the proximal left V1 segment,
concerning for acute dissection (series 7, image 130). Area of
involvement begins at or just beyond the origin, and extends
approximately 2.3 cm in length. Associated moderate to severe
stenosis proximally (series 5, image 271). Distally, the left
vertebral artery is otherwise widely patent to the skull base.

Skeleton: No visible acute osseous finding. No discrete or worrisome
osseous lesions.

Other neck: No other acute soft tissue abnormality within the neck.
Endotracheal and enteric tubes in place.

Upper chest: Mild atelectatic changes noted dependently within the
visualized lungs. Visualized upper chest demonstrates no other acute
finding.

Review of the MIP images confirms the above findings

CTA HEAD FINDINGS

Anterior circulation: Both internal carotid arteries widely patent
to the termini without stenosis. A1 segments widely patent. Normal
anterior communicating artery complex. Both anterior cerebral
arteries widely patent to their distal aspects without stenosis. No
M1 stenosis or occlusion. Normal MCA bifurcations. Distal MCA
branches well perfused and symmetric.

Posterior circulation: Both V4 segments patent to the
vertebrobasilar junction without stenosis or other abnormality. Both
PICA origins patent and normal. Basilar widely patent proximally.
There is some a subtle irregular filling defect involving the distal
basilar artery/basilar tip, concerning for intraluminal thrombus
(series 7, image 128). Secondary mild to moderate narrowing at the
basilar tip. Neither superior cerebellar artery well visualized at
the origin., and could be occluded. Irregular partially occlusive
filling defect seen involving the left P1 segment with resultant
moderate to severe stenosis (series 7, image 129). Left PCA
otherwise widely patent distally. On the right, there is acute
occlusion at the distal right P2 segment (series 7, image 141).
Perfusion within right PCA branches distally could be related to
subocclusive thrombus and/or collateralization.

Venous sinuses: Dedicated venogram images were performed. Normal
enhancement seen throughout the superior sagittal sinus to the level
of the torcula. Transverse and sigmoid sinuses are patent
bilaterally. Proximal internal jugular veins patent. Straight sinus,
vein of MSRILYN, internal cerebral veins, and basal veins of MSRILYN
are patent. No visible abnormality about the cavernous sinus.
Superior orbital veins grossly symmetric and within normal limits.
No evidence for dural sinus thrombosis. No appreciable cortical
venous thrombosis.

Anatomic variants: None significant.  No aneurysm.

Review of the MIP images confirms the above findings
IMPRESSION: 1. Intimal irregularity with filling defect involving the proximal
left vertebral artery, V1 segment, concerning for acute arterial
dissection.
2. Subtle irregular nonocclusive filling defect involving the distal
basilar artery/basilar tip, consistent with associated distal
embolic disease. Adjacent superior cerebellar arteries are not
visualized at their origins, and could be occluded.
3. Associated partially occlusive thrombus with severe stenosis
involving the left P1 segment.
4. Additional distal right P2 embolic occlusion.
5. Wide patency of both carotid artery systems and anterior
circulation.
6. Negative CT venogram.  No evidence for dural sinus thrombosis.

Critical Value/emergent results were called by telephone at the time
of interpretation on [DATE] at [DATE] to provider MSRILYN
MSRILYN , who verbally acknowledged these results.

## 2021-07-27 IMAGING — DX DG ABD PORTABLE 1V
1 series · 1 of 1 positions shown · non-contrast
Comparison: None.

CLINICAL DATA: NG tube placement

EXAM:
PORTABLE ABDOMEN - 1 VIEW

[abdomen kub]
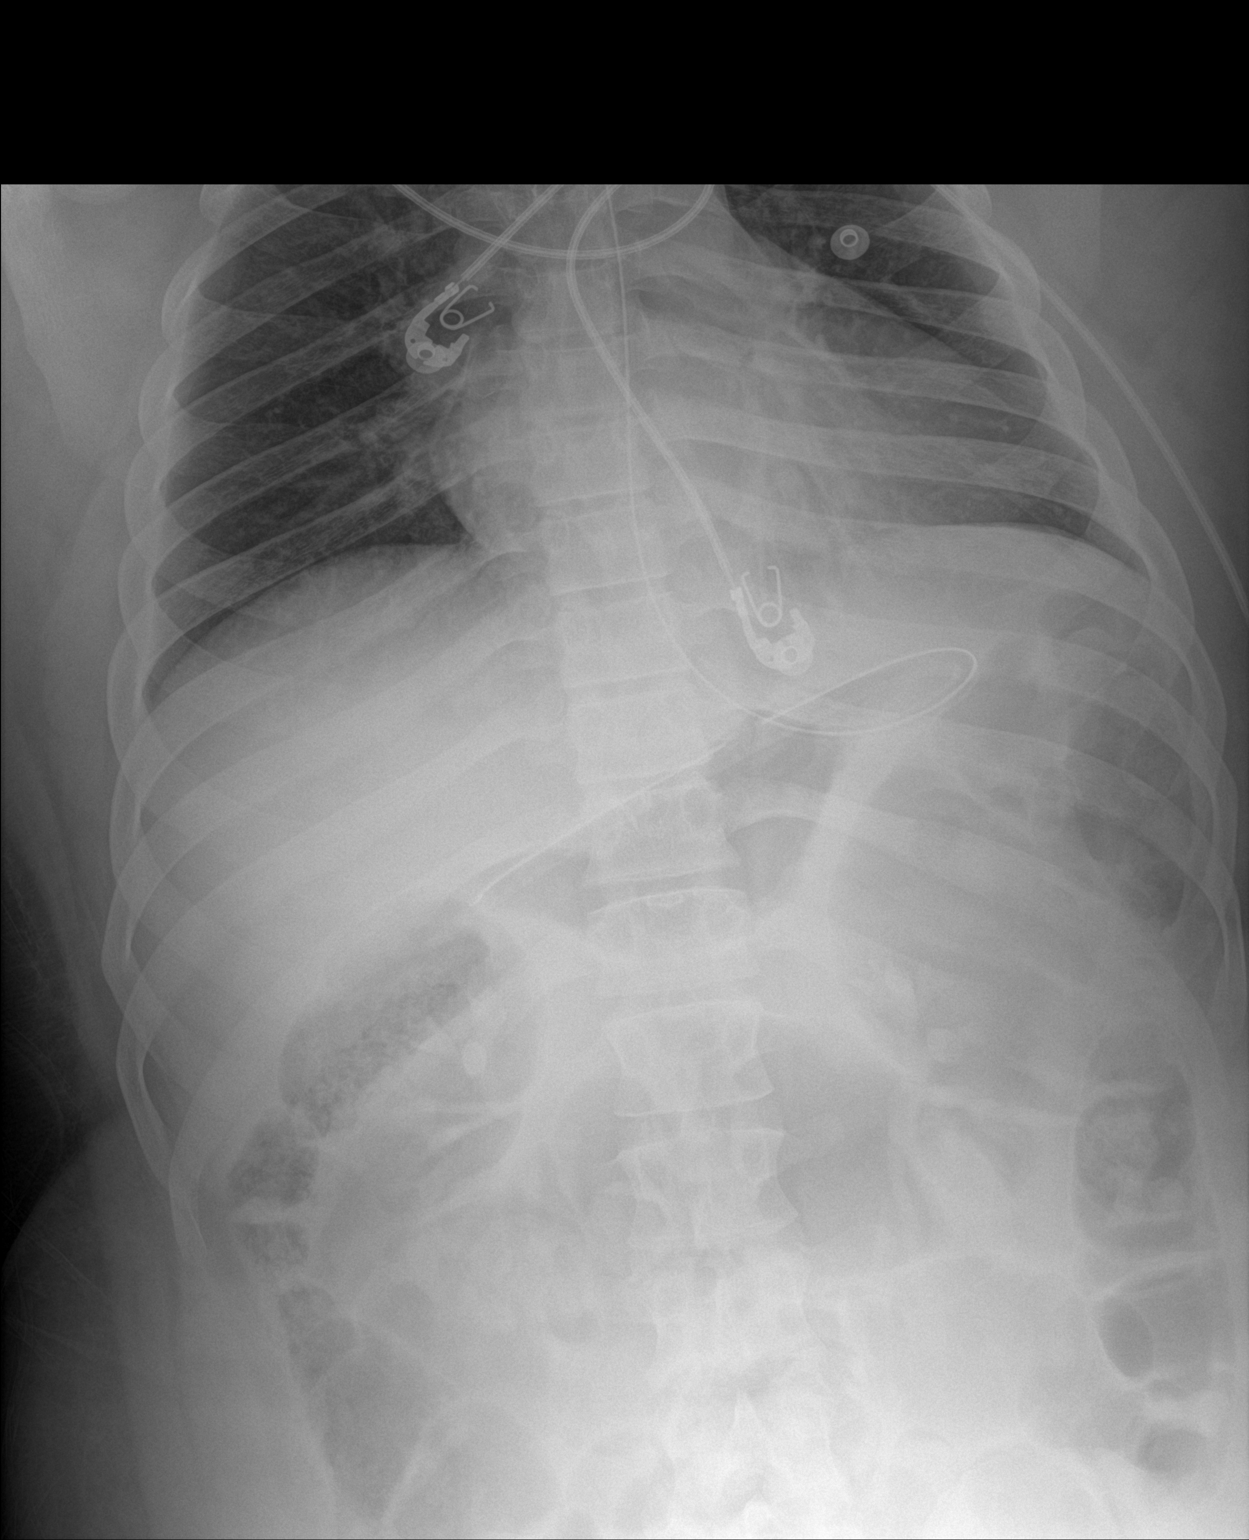

[1 of 1 positions shown; findings below may reference images not displayed]

FINDINGS: Endotracheal tube overlies the midthoracic trachea. Nasogastric tube
side port overlies the body of the stomach, tip overlying the distal
stomach. There is gas distention of bowel in the abdomen.
IMPRESSION: Nasogastric tube tip and side port overlie the stomach.

Gaseous distension of bowel in the abdomen.

## 2021-07-27 IMAGING — DX DG PELVIS 1-2V
1 series · 1 of 1 positions shown · non-contrast
Comparison: CT angiography head [DATE]

CLINICAL DATA: Stroke

EXAM:
PELVIS - 1-2 VIEW

[pelvis]
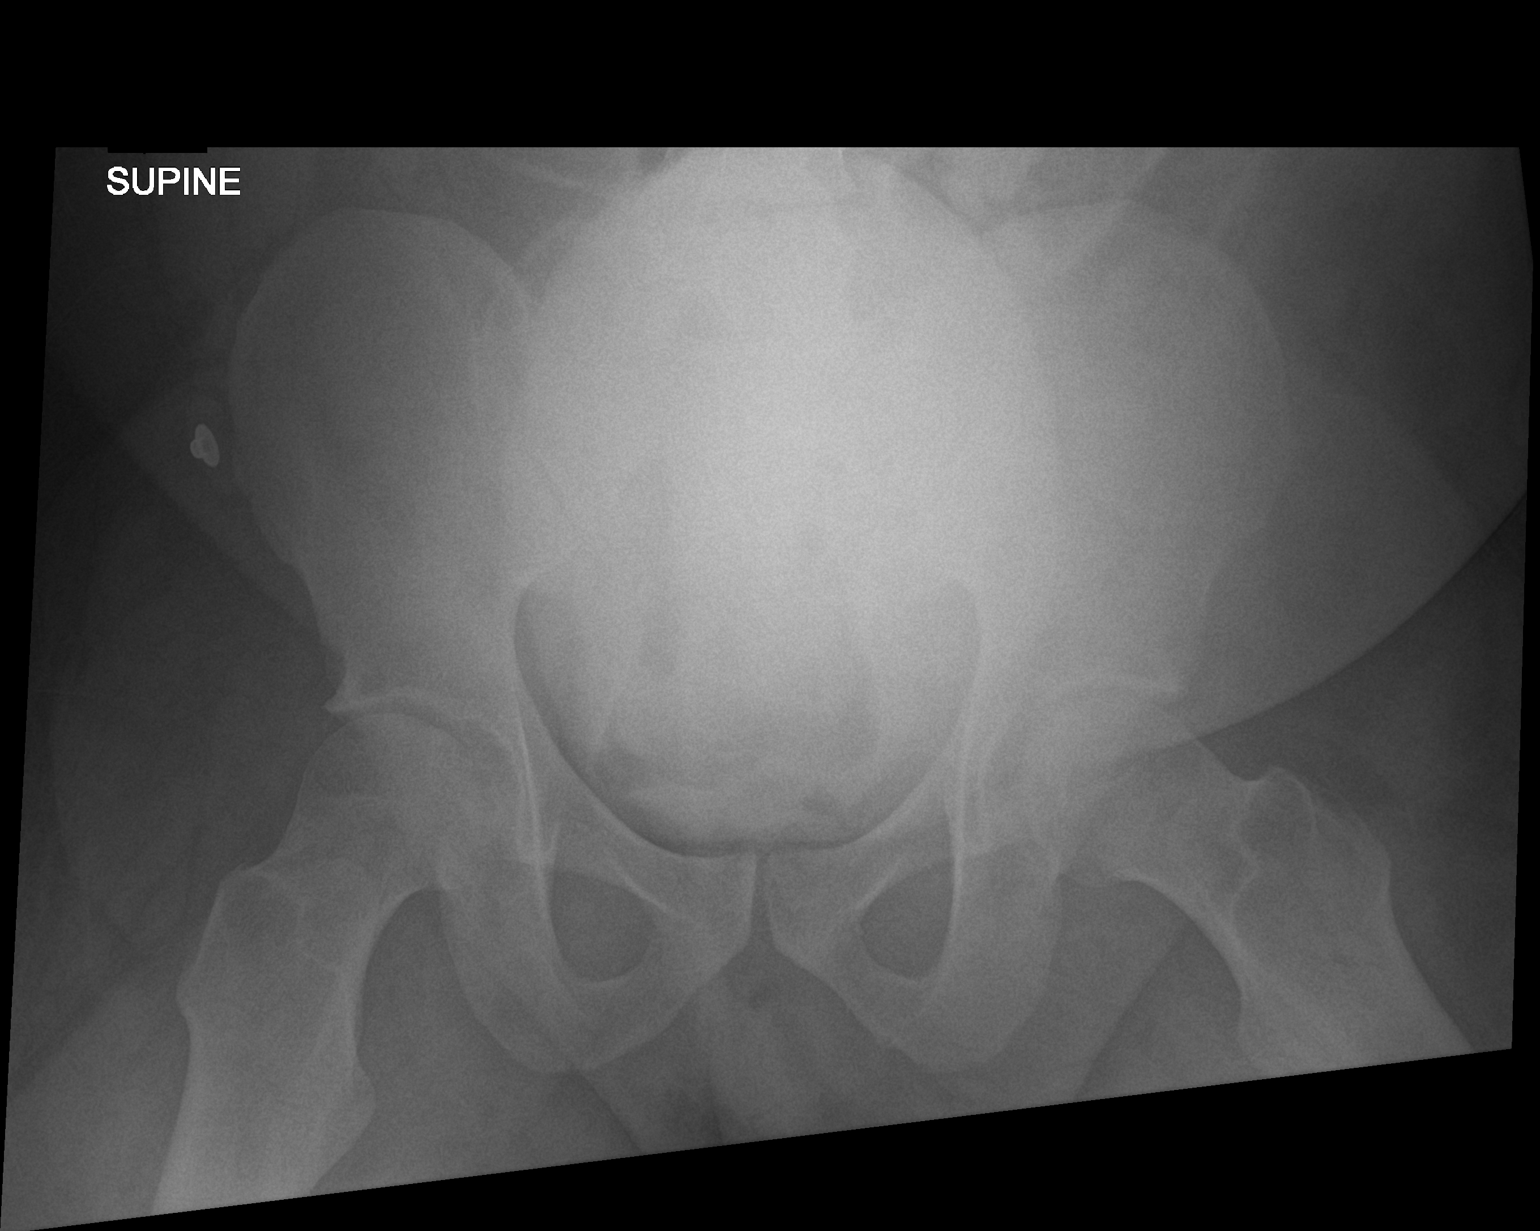

[1 of 1 positions shown; findings below may reference images not displayed]

FINDINGS: Approximally 17 cm oval opacity overlying the pelvis likely
represents a contrast filled urinary bladder due to recent
intravenous contrast administration.

Markedly limited evaluation due to overlapping osseous structures
and overlying soft tissues. There is no evidence of pelvic fracture
or diastasis. No pelvic bone lesions are seen.
IMPRESSION: Urinary bladder lumen filled with excreted intravenous contrast.

## 2021-07-27 IMAGING — CT CT VENOGRAM HEAD
1 of 6 series · 12 of 33 positions shown · IV contrast (OMNI 350)
Comparison: Prior noncontrast head CT from [DATE]./

CLINICAL DATA: Initial evaluation for acute altered mental status,
evaluate for basilar thrombus.

EXAM:
CT ANGIOGRAPHY HEAD AND NECK WITH CONTRAST
CT VENOGRAM HEAD WITH CONTRAST
TECHNIQUE: Multidetector CT imaging of the head and neck was performed using
the standard protocol during bolus administration of intravenous
contrast. Multiplanar CT image reconstructions and MIPs were
obtained to evaluate the vascular anatomy. Carotid stenosis
measurements (when applicable) are obtained utilizing NASCET
criteria, using the distal internal carotid diameter as the
denominator.
CONTRAST:  60mL OMNIPAQUE IOHEXOL 350 MG/ML SOLN

[Series 4: head delay · axial · delayed · 0.44mm/px · z∈[+395,+525]mm · 12 of 32 slices shown]
[im 3/32  soft-tissue]
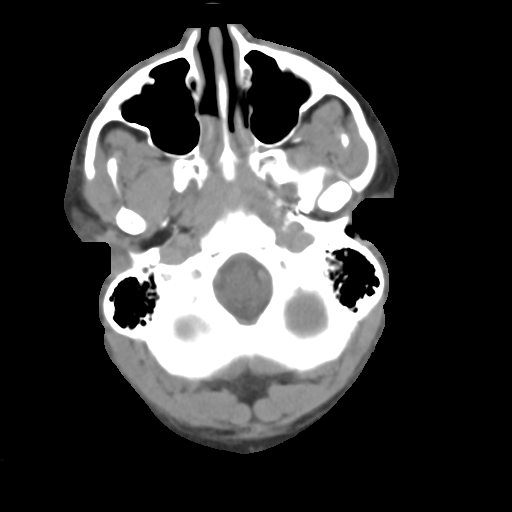
[im 5/32  bone]
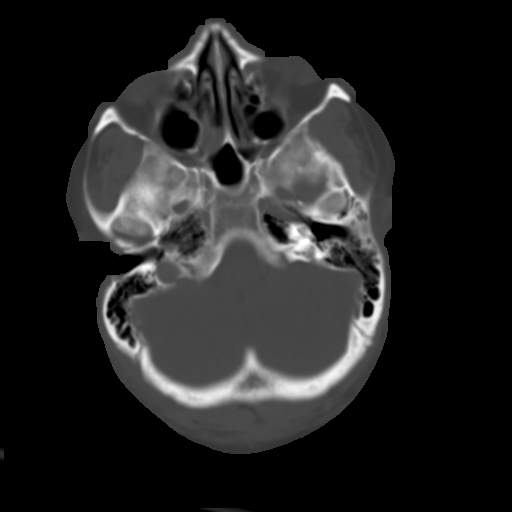
[im 8/32  soft-tissue]
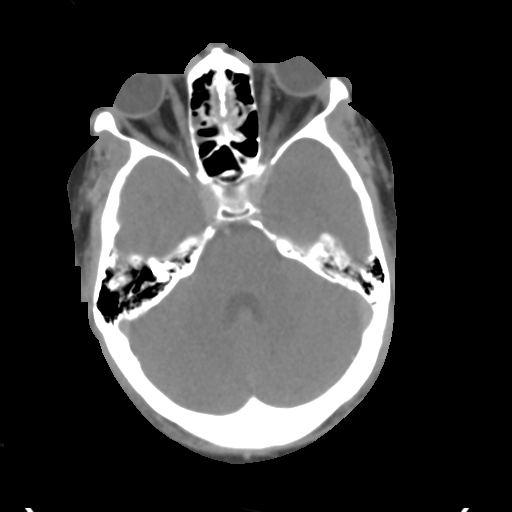
[im 10/32  bone]
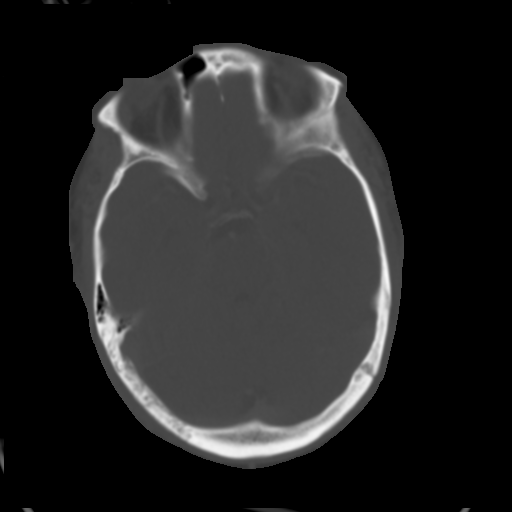
[im 12/32  soft-tissue]
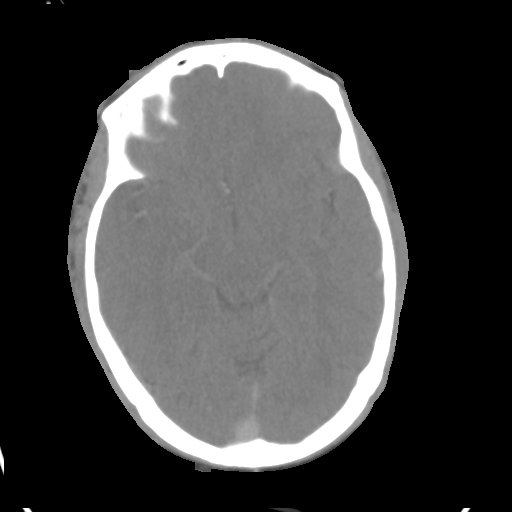
[im 15/32  bone]
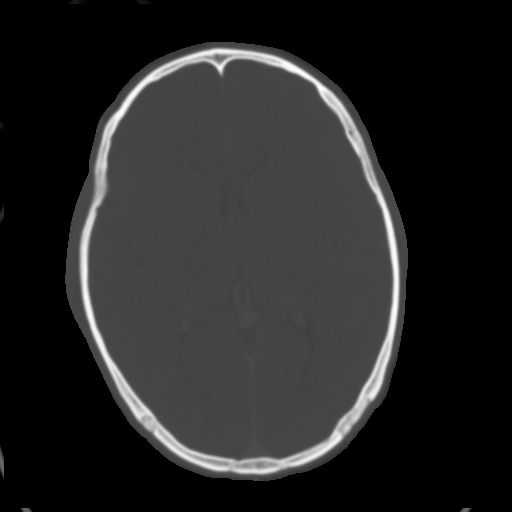
[im 17/32  soft-tissue]
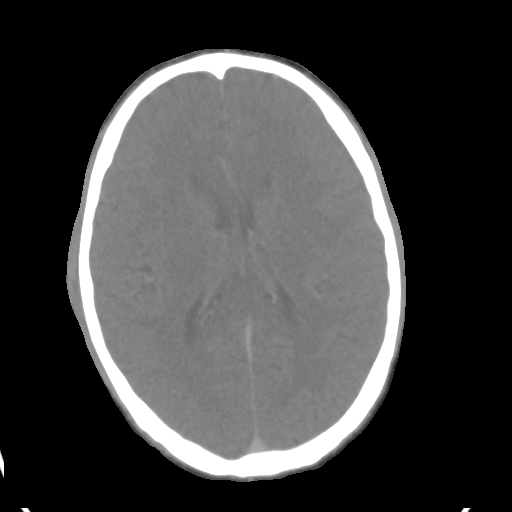
[im 20/32  bone]
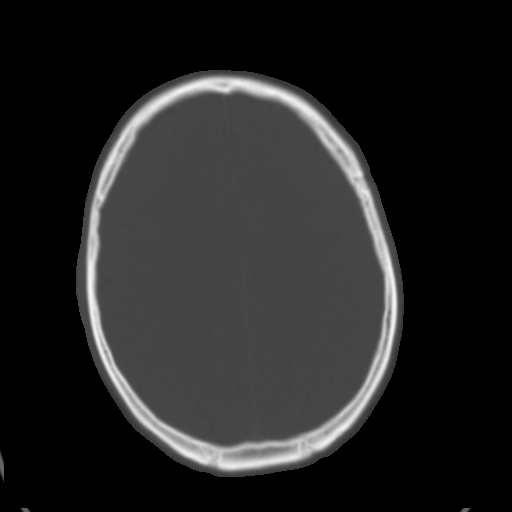
[im 22/32  soft-tissue]
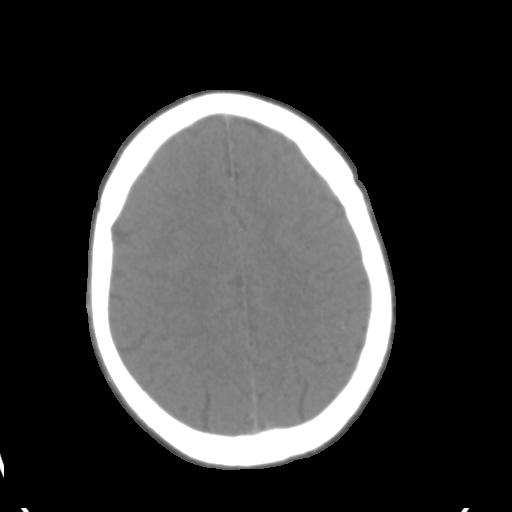
[im 24/32  bone]
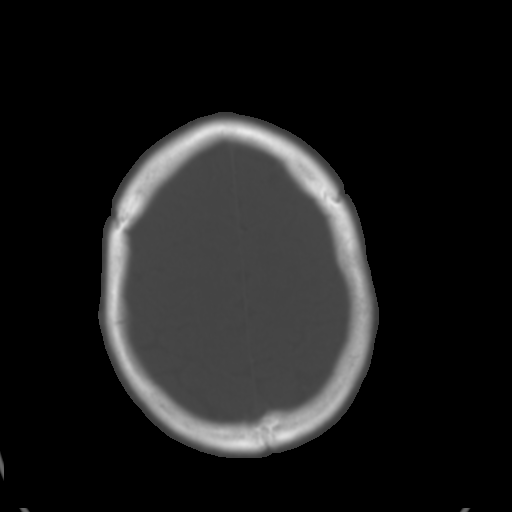
[im 27/32  soft-tissue]
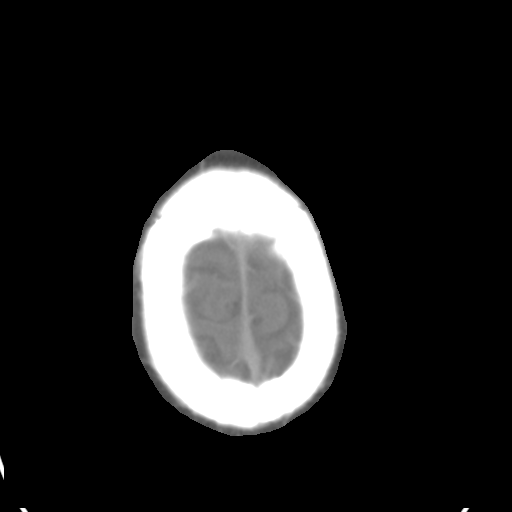
[im 29/32  bone]
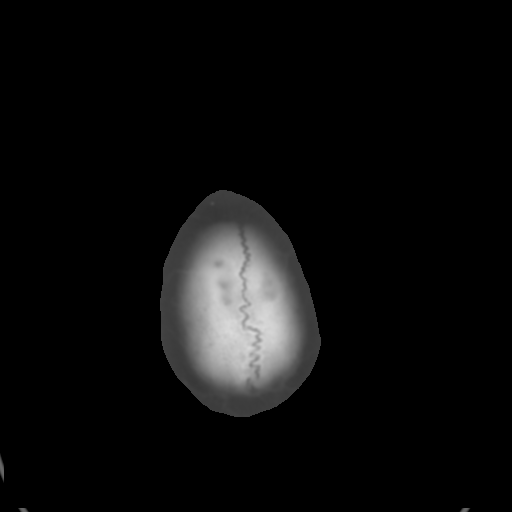

[12 of 33 positions shown; findings below may reference images not displayed]

FINDINGS: CTA NECK FINDINGS

Aortic arch: Visualized aortic arch normal in caliber with normal
branch pattern. No stenosis or other abnormality about the origin of
the great vessels.

Right carotid system: Right common and internal carotid arteries
widely patent without stenosis, dissection or occlusion.

Left carotid system: Left common and internal carotid arteries
widely patent without stenosis, dissection or occlusion.

Vertebral arteries: Both vertebral arteries arise from the
subclavian arteries. No proximal subclavian artery stenosis. Right
vertebral artery widely patent within the neck without stenosis or
other abnormality. On the left, there is intimal irregularity with
intraluminal filling defect involving the proximal left V1 segment,
concerning for acute dissection (series 7, image 130). Area of
involvement begins at or just beyond the origin, and extends
approximately 2.3 cm in length. Associated moderate to severe
stenosis proximally (series 5, image 271). Distally, the left
vertebral artery is otherwise widely patent to the skull base.

Skeleton: No visible acute osseous finding. No discrete or worrisome
osseous lesions.

Other neck: No other acute soft tissue abnormality within the neck.
Endotracheal and enteric tubes in place.

Upper chest: Mild atelectatic changes noted dependently within the
visualized lungs. Visualized upper chest demonstrates no other acute
finding.

Review of the MIP images confirms the above findings

CTA HEAD FINDINGS

Anterior circulation: Both internal carotid arteries widely patent
to the termini without stenosis. A1 segments widely patent. Normal
anterior communicating artery complex. Both anterior cerebral
arteries widely patent to their distal aspects without stenosis. No
M1 stenosis or occlusion. Normal MCA bifurcations. Distal MCA
branches well perfused and symmetric.

Posterior circulation: Both V4 segments patent to the
vertebrobasilar junction without stenosis or other abnormality. Both
PICA origins patent and normal. Basilar widely patent proximally.
There is some a subtle irregular filling defect involving the distal
basilar artery/basilar tip, concerning for intraluminal thrombus
(series 7, image 128). Secondary mild to moderate narrowing at the
basilar tip. Neither superior cerebellar artery well visualized at
the origin., and could be occluded. Irregular partially occlusive
filling defect seen involving the left P1 segment with resultant
moderate to severe stenosis (series 7, image 129). Left PCA
otherwise widely patent distally. On the right, there is acute
occlusion at the distal right P2 segment (series 7, image 141).
Perfusion within right PCA branches distally could be related to
subocclusive thrombus and/or collateralization.

Venous sinuses: Dedicated venogram images were performed. Normal
enhancement seen throughout the superior sagittal sinus to the level
of the torcula. Transverse and sigmoid sinuses are patent
bilaterally. Proximal internal jugular veins patent. Straight sinus,
vein of MSRILYN, internal cerebral veins, and basal veins of MSRILYN
are patent. No visible abnormality about the cavernous sinus.
Superior orbital veins grossly symmetric and within normal limits.
No evidence for dural sinus thrombosis. No appreciable cortical
venous thrombosis.

Anatomic variants: None significant.  No aneurysm.

Review of the MIP images confirms the above findings
IMPRESSION: 1. Intimal irregularity with filling defect involving the proximal
left vertebral artery, V1 segment, concerning for acute arterial
dissection.
2. Subtle irregular nonocclusive filling defect involving the distal
basilar artery/basilar tip, consistent with associated distal
embolic disease. Adjacent superior cerebellar arteries are not
visualized at their origins, and could be occluded.
3. Associated partially occlusive thrombus with severe stenosis
involving the left P1 segment.
4. Additional distal right P2 embolic occlusion.
5. Wide patency of both carotid artery systems and anterior
circulation.
6. Negative CT venogram.  No evidence for dural sinus thrombosis.

Critical Value/emergent results were called by telephone at the time
of interpretation on [DATE] at [DATE] to provider MSRILYN
MSRILYN , who verbally acknowledged these results.

## 2021-07-27 IMAGING — MR MR HEAD W/O CM
8 of 14 series · 19 of 48 positions shown · non-contrast
Comparison: Head CT [DATE].

CLINICAL DATA: Neuro deficit, acute, stroke suspected. Status post
mechanical thrombectomy.

EXAM:
MRI HEAD WITHOUT CONTRAST
TECHNIQUE: Multiplanar, multiecho pulse sequences of the brain and surrounding
structures were obtained without intravenous contrast.

[Series 2: DWI · axial · 3.0mm · 0.94mm/px · z∈[-130,+19]mm · 3 of 98 slices shown (1 of 2)]
[im 1/98]
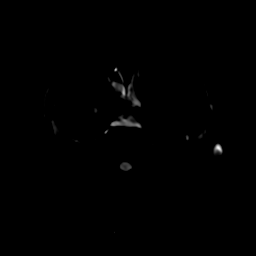
[im 49/98]
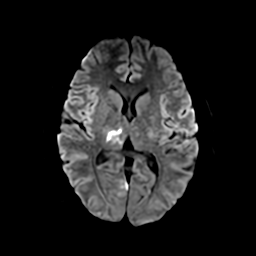
[im 98/98]
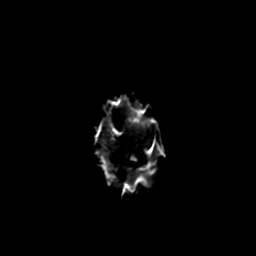

[Series 3: DWI · coronal · 4.0mm · 0.94mm/px · 3 of 79 slices shown (2 of 2)]
[im 1/79]
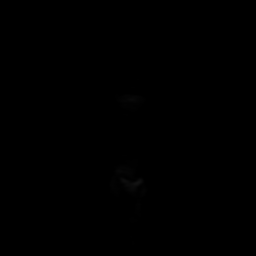
[im 40/79]
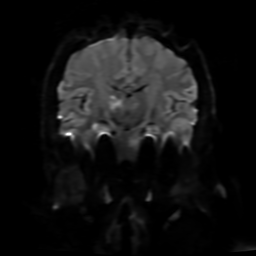
[im 79/79]
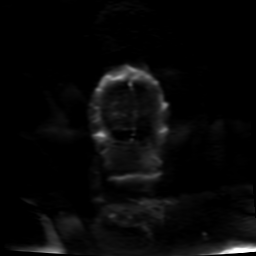

[Series 4: FLAIR · sagittal · 5.0mm · 0.23mm/px · 1 of 23 slices shown (1 of 3)]
[im 1/23]
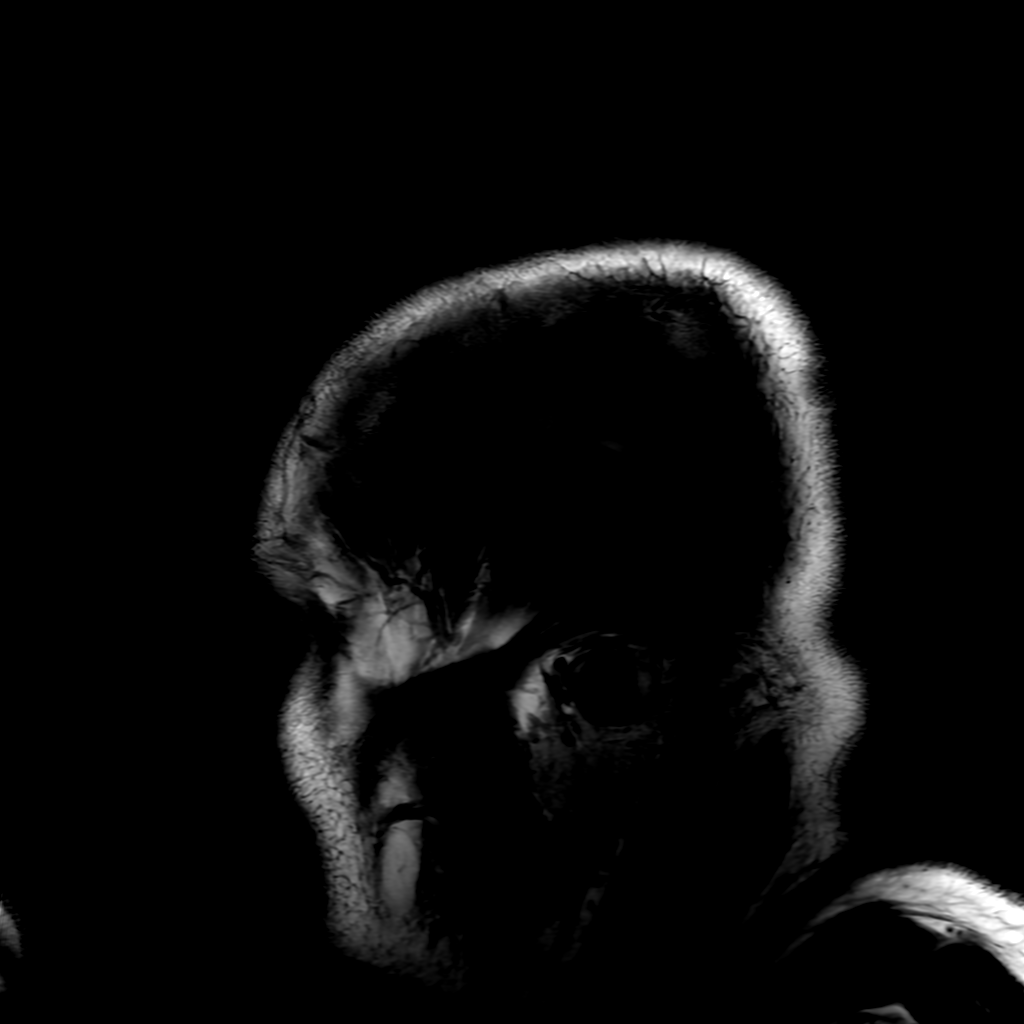

[Series 5: T2 · axial · 5.0mm · 0.23mm/px · 1 of 26 slices shown]
[im 1/26]
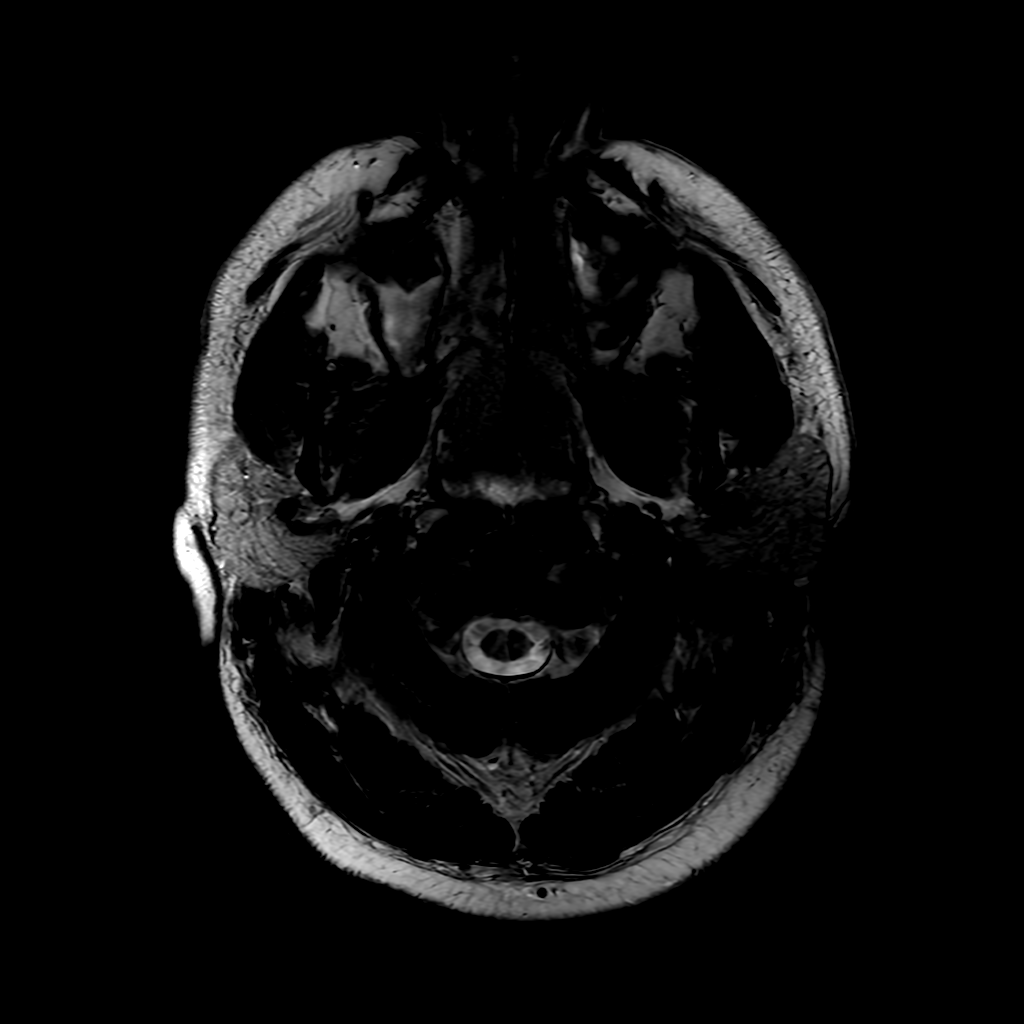

[Series 6: FLAIR · axial · 4.0mm · 0.47mm/px · 1 of 35 slices shown (2 of 3)]
[im 1/35]
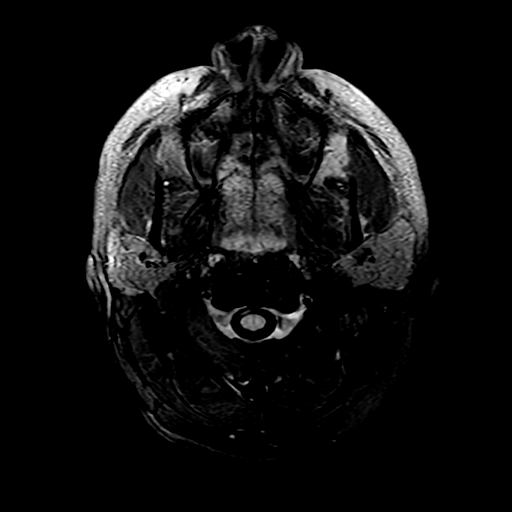

[Series 10: FLAIR · sagittal · 1.6mm · 0.49mm/px · 7 of 192 slices shown (3 of 3)]
[im 1/192]
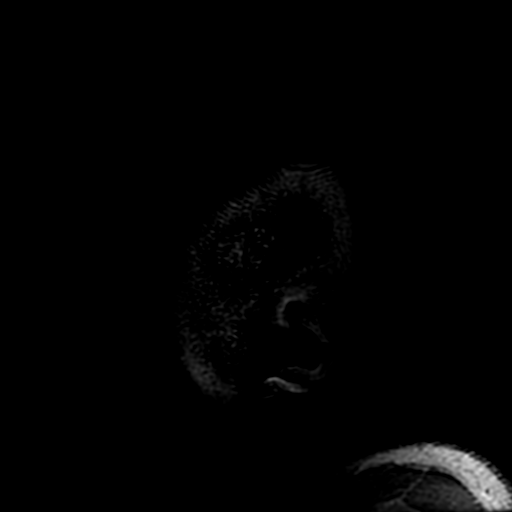
[im 32/192]
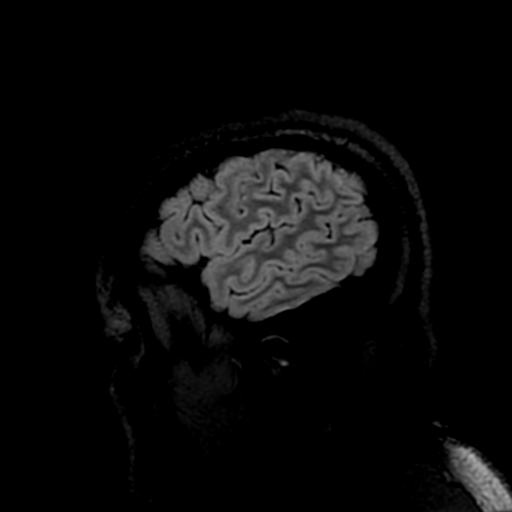
[im 64/192]
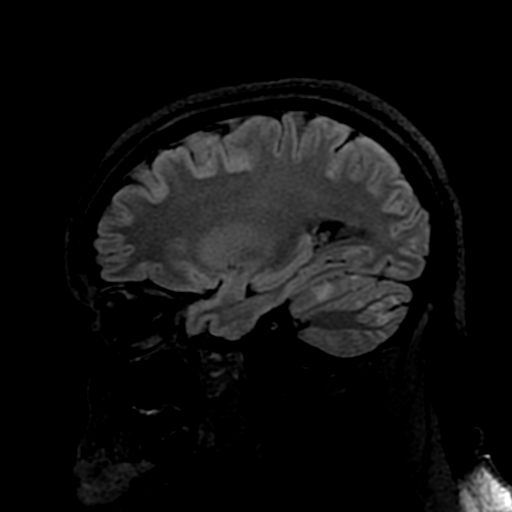
[im 96/192]
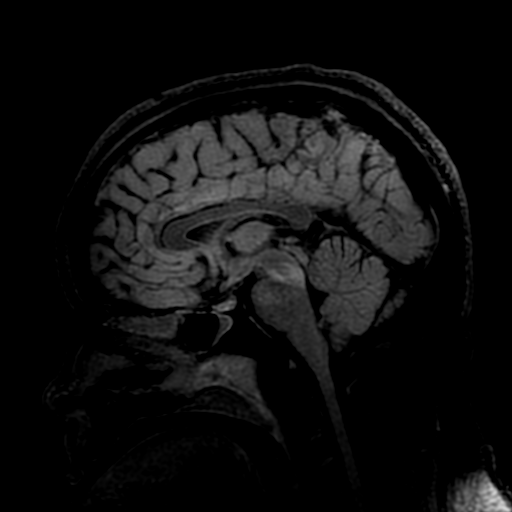
[im 128/192]
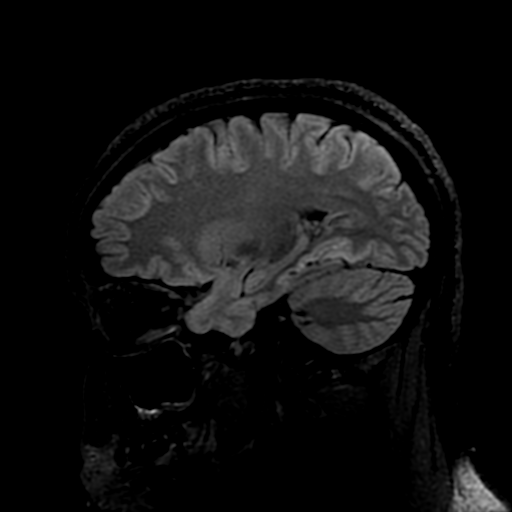
[im 160/192]
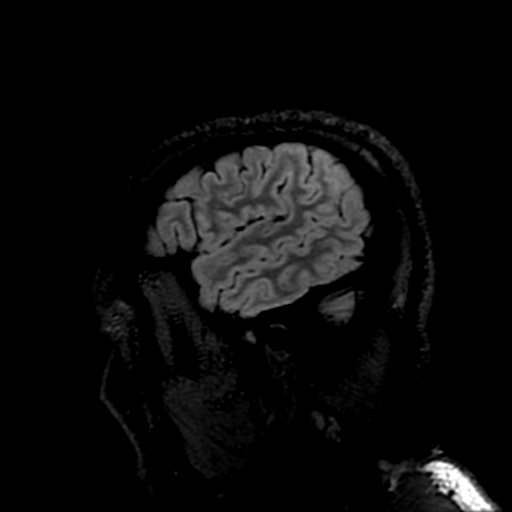
[im 192/192]
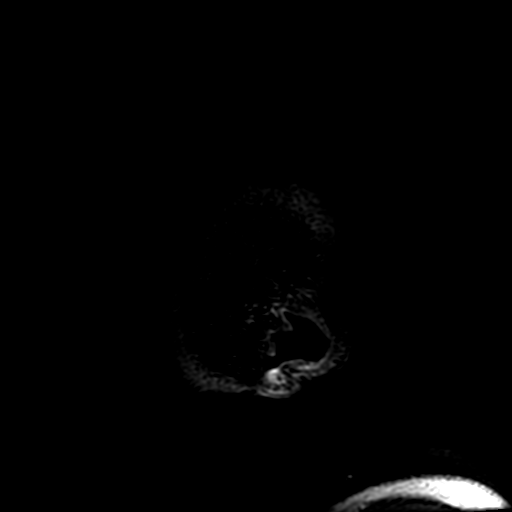

[Series 250: ADC · axial · 3.0mm · 0.94mm/px · z∈[-130,+19]mm · 2 of 51 slices shown (1 of 2)]
[im 1/51]
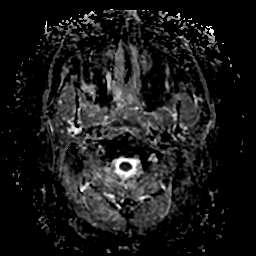
[im 51/51]
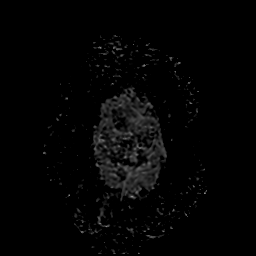

[Series 350: ADC · coronal · 4.0mm · 0.94mm/px · 1 of 40 slices shown (2 of 2)]
[im 1/40]
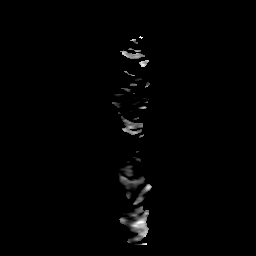

[19 of 48 positions shown; findings below may reference images not displayed]

FINDINGS: Brain: Scattered areas of restricted diffusion are seen involving
the right thalamus, medial aspect of the right occipital and
temporal lobes, including right hippocampus, centrally in the
midbrain, on the left side of the pons and in the bilateral
cerebellar hemispheres consistent with acute infarcts. Petechial
hemorrhage seen in the left inferior cerebellar hemisphere and right
occipital lobe.

No hydrocephalus, extra-axial collection or mass lesion.

Vascular: Normal flow voids.

Skull and upper cervical spine: Normal marrow signal.

Sinuses/Orbits: Mucosal thickening throughout the paranasal sinuses
with fluid level within the sphenoid sinuses. The orbits are
maintained.

Other: Presence of endotracheal tube.
IMPRESSION: Acute infarcts involving the right thalamus, medial aspect of the
right occipital and temporal lobes, including right hippocampus,
centrally in the midbrain, on the left side of the pons and in the
bilateral cerebellar hemispheres. Petechial hemorrhage seen in the
left inferior cerebellar hemisphere right occipital lobe. No
significant mass effect.

## 2021-07-27 IMAGING — XA IR PERCUTANEOUS ART THORMBECTOMY/INFUSION INTRACRANIAL INCLUDE D
1 series · 8 of 24 positions shown · IV contrast (IODINE)
Comparison: CT angiogram of the head and neck [DATE].

INDICATION: Onset of seizures, decreased level of consciousness. CT angiogram of
the head and neck demonstrates a proximal left vertebral artery
dissection with associated significant stenosis, and bilateral PCA
occlusions proximal left P1, and distal right P1, and distal basilar
artery.

EMERGENT LARGE VESSEL OCCLUSION THROMBOLYSIS (POSTERIOR CIRCULATION)
TECHNIQUE: Following a full explanation of the procedure along with the
potential associated complications, an informed witnessed consent
was obtained. The risks of intracranial hemorrhage of 10%, worsening
neurological deficit, ventilator dependency, death and inability to
revascularize were all reviewed in detail with the patient's sister.

[Series 300: ir percutaneous art thrombectomy/infusio · 8 of 213 slices shown]
[im 10/213]
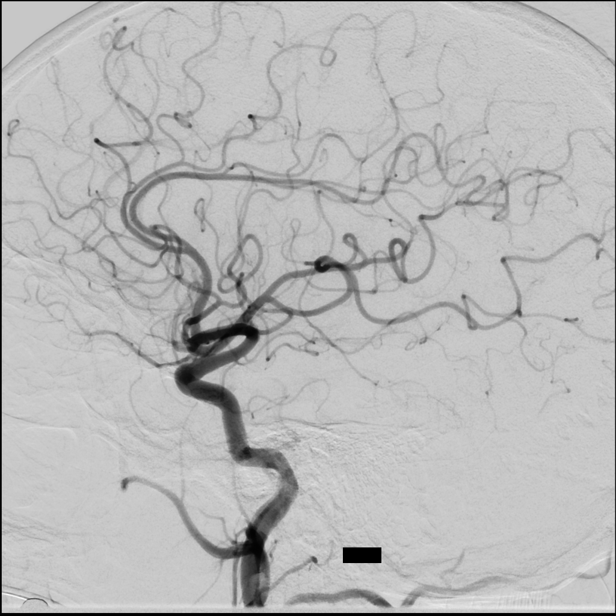
[im 37/213]
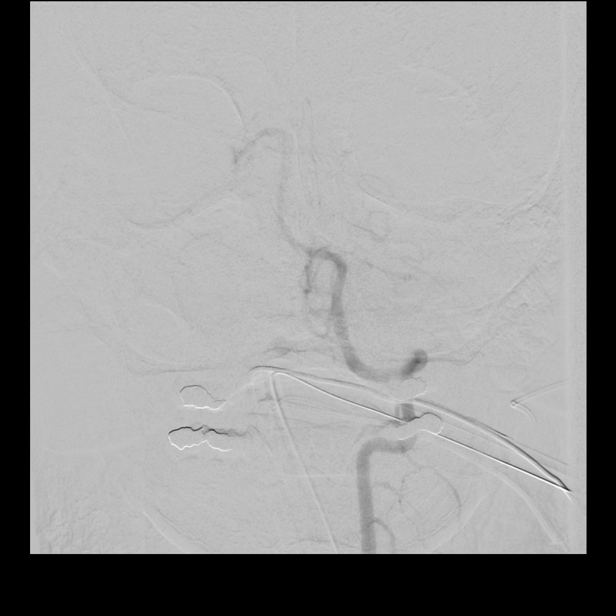
[im 65/213]
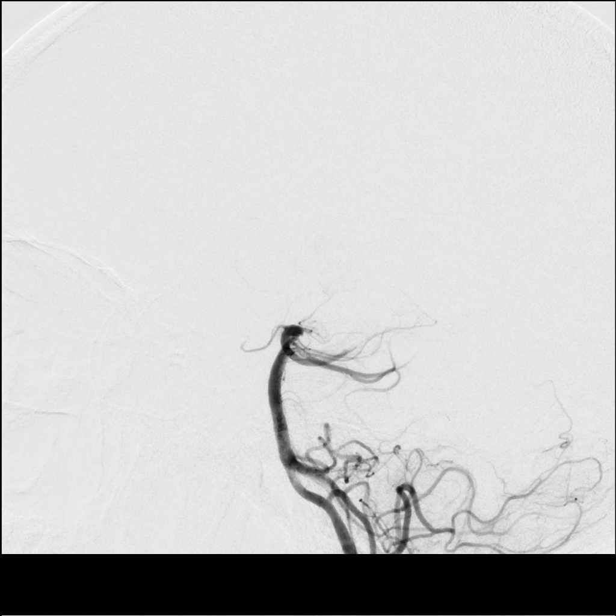
[im 93/213]
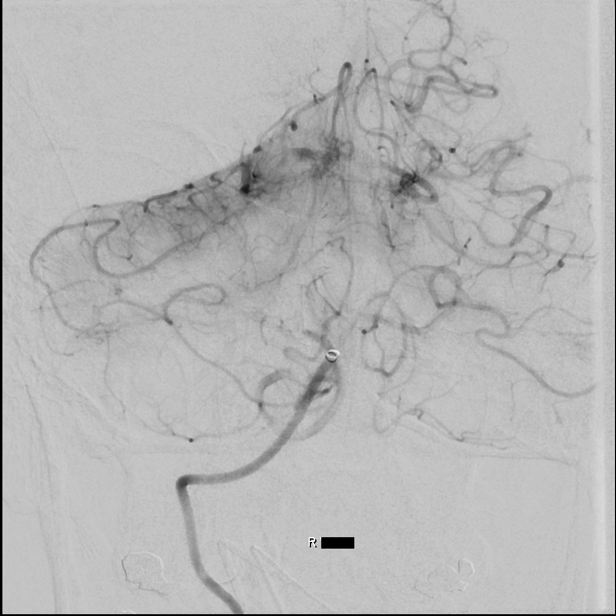
[im 120/213]
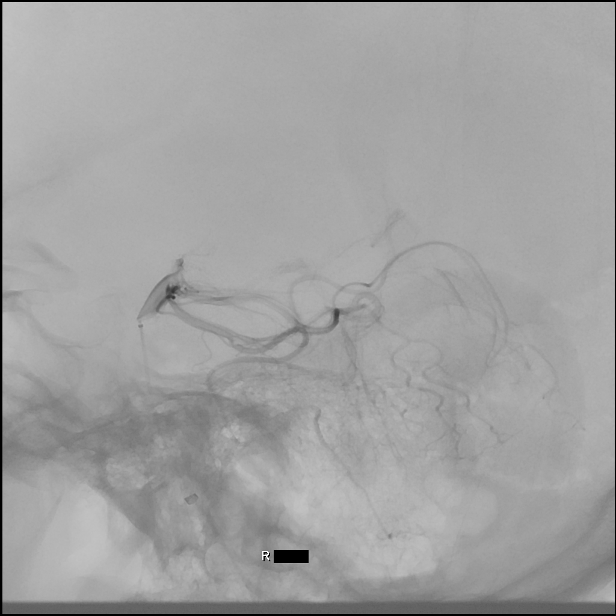
[im 148/213]
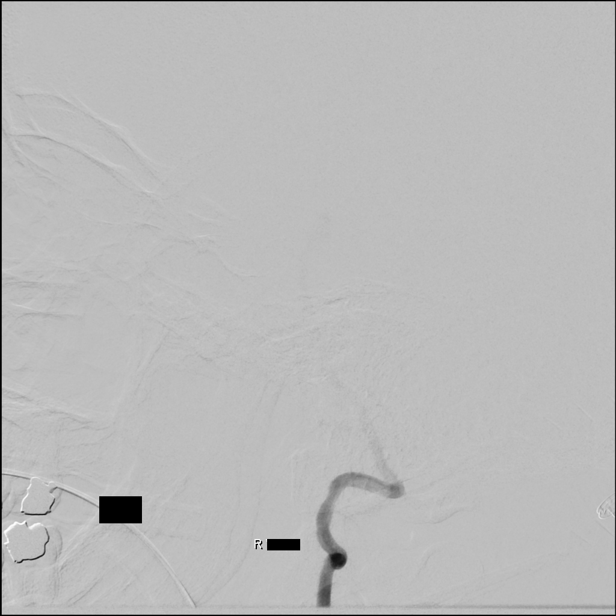
[im 176/213]
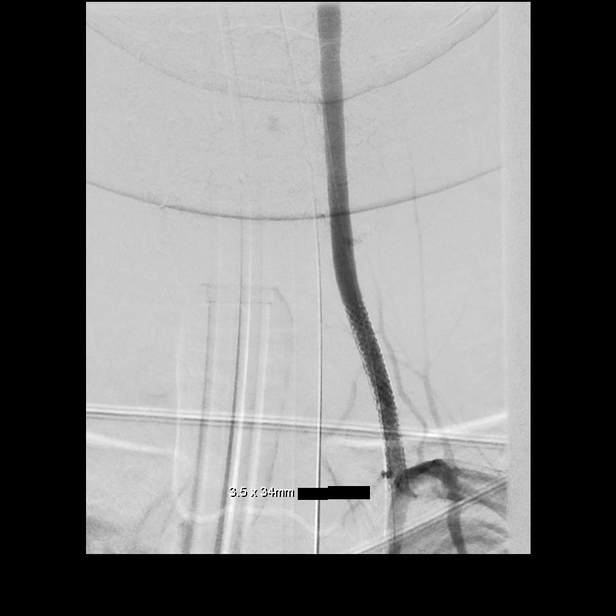
[im 203/213]
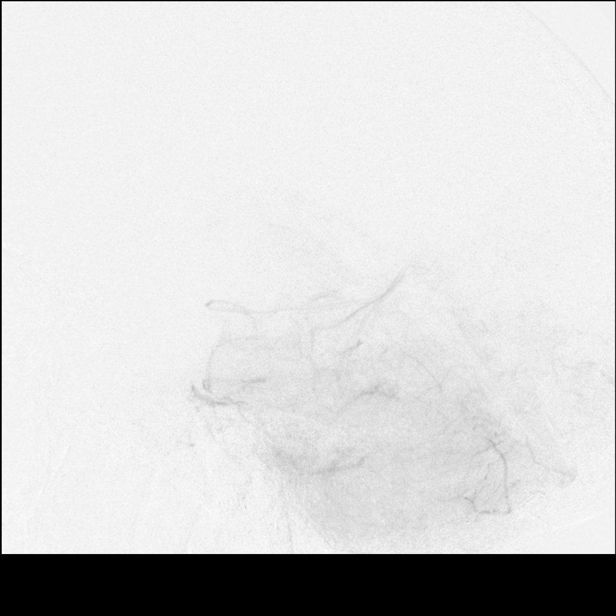

[8 of 24 positions shown; findings below may reference images not displayed]

MEDICATIONS:
Ancef 2 g IV antibiotic was administered within 1 hour of the
procedure.

ANESTHESIA/SEDATION:
General anesthesia.

CONTRAST:  Omnipaque 300 proximally 130 mL.

FLUOROSCOPY TIME:  Fluoroscopy Time: 72 minutes 42 seconds ([6F]
mGy).

COMPLICATIONS:
None immediate.
The patient was then put under general anesthesia by the [REDACTED] at [HOSPITAL].

The right groin was prepped and draped in the usual sterile fashion.
Thereafter using modified Seldinger technique, transfemoral access
into the right common femoral artery was obtained without
difficulty. Over a 0.035 inch guidewire an 8 French Pinnacle 25 cm
sheath was inserted. Through this, and also over a 0.035 inch
guidewire a 5 French JB 1 catheter was advanced to the aortic arch
region and selectively positioned in the right common carotid
artery, the right subclavian artery, the left vertebral artery.
FINDINGS: The right common carotid arteriogram demonstrates the right external
carotid artery and its major branches to be widely patent.

The right internal carotid artery at the bulb to the cranial skull
base is widely patent.

There is a mild to moderate stenosis at the cervical petrous
junction.

More distally, the petrous, the cavernous and the supraclinoid
segments are widely patent.

The right middle cerebral artery and the right anterior cerebral
artery opacify into the capillary and venous phases. Transient
cross-filling via the anterior communicating artery of the left
anterior cerebral A2 segment and distally is seen. The innominate
arteriogram demonstrates the right common carotid artery proximally,
and the right subclavian artery proximally to be widely patent.

The right vertebral artery is seen to be patent with opacification
made to the cranial skull base.

The left vertebral artery arteriogram demonstrates abnormal
appearance in the proximal [DATE] starting at the origin. There is a
tapered irregularity associated with severe stenosis and a prominent
smooth filling defect. More distally, the vessel opacifies normally
to the cranial skull base. Patency is seen of the left
vertebrobasilar junction and the left posterior-inferior cerebellar
artery. Prominent filling defects are seen in the distal basilar
artery, and the left posterior cerebral artery at its origin, and
the right posterior cerebral artery in the distal P1 segment.

PROCEDURE:
The diagnostic JB 1 catheter in the distal right subclavian artery
was then exchanged over a 0.035 inch 300 cm Rosen exchange guidewire
for a 90 cm 8 French Neuron Max sheath, which was advanced to the
distal right subclavian artery. The guidewire was removed. Good
aspiration obtained from the hub of the Neuron Max sheath.

A control arteriogram performed through the Neuron Max sheath after
it had been withdrawn just proximal to the origin of the right
vertebral artery demonstrates no evidence of spasms, dissections or
of intraluminal filling defects. Over a 0.035 Roadrunner guidewire,
a diagnostic JB 1 catheter was advanced within the Neuron Max sheath
to its distal end. The vertebral artery was then selected with the
guidewire followed by the 5 French diagnostic catheter which, the 8
French Neuron Max sheath was advanced to the distal cervical right
ICA. The guidewire, and the support catheter were removed. Good
aspiration obtained from the hub of the Neuron Max sheath. A gentle
control arteriogram performed through this demonstrated no evidence
of spasms, dissections or of intraluminal filling defects. The
opacified right vertebral artery distally, the right vertebrobasilar
junction and the right posterior-inferior cerebellar artery
demonstrate wide patency.

Cross-filling of the left vertebrobasilar junction was noted. The
basilar artery and its proximal [DATE], the anterior-inferior
cerebellar arteries demonstrate uniform enhancement.

However, prominent pre occlusive filling defects noted in the distal
[DATE] of the basilar artery extending into the right posterior
cerebral artery distal P1 segment, and the origin of the left
posterior cerebral artery.

ENDOVASCULAR REVASCULARIZATION OF DISTAL BASILAR ARTERY THROMBOSIS,
AND OF THE POSTERIOR CEREBRAL ARTERIES AND OF THE SYMPTOMATIC
DISSECTED PROXIMAL LEFT VERTEBRAL ARTERY EXTENDING TO THE ORIGIN.

Over a 0.014 inch standard Synchro micro guidewire with a moderate J
configuration, the combination of a 162 cm 021 microcatheter inside
of an 071 132 cm Zoom aspiration catheter combination was advanced
to the right vertebrobasilar junction.

The micro guidewire was then gently advanced more distally into the
distal basilar artery initially and then into the left posterior
cerebral artery distal P2 segment.

The guidewire was removed. Good aspiration obtained from the hub of
the microcatheter. Gentle control arteriogram performed through the
microcatheter demonstrated safe positioning of the tip of the
microcatheter. This was then connected to continuous heparinized
saline infusion. At this time, a 4 mm x 40 mm Solitaire X retrieval
device was advanced to the distal end of the microcatheter. The O
ring on the delivery microcatheter was loosened. With slight forward
gentle traction with the right hand on the delivery micro guidewire,
with the left hand the delivery microcatheter was unsheathed
deploying the retrieval device. The proximal portion of the device
was seen in the distal basilar artery.

Thereafter, with constant aspiration with a Penumbra aspiration
device at the hub of the 071 Zoom aspiration catheter and a 60 mL
syringe at the hub of the Neuron Max sheath for approximately 3-1/2
minutes, the combination of the retrieval device, the 071 Zoom
aspiration catheter, and the microcatheter were retrieved and
removed. A control arteriogram performed through the Neuron Max
sheath in the distal right vertebral artery demonstrated near
complete revascularization of the distal basilar artery and the
posterior cerebral arteries. A small filling defect was noted in the
distal left superior cerebellar artery. This was deemed too distal
and small for endovascular retrieval.

A TICI 2C reperfusion was achieved.

The sheath was then retrieved and positioned at the origin of the
left vertebral artery.

A control arteriogram performed through the Neuron Max sheath just
proximal to the origin of the left vertebral artery continued to
demonstrate sequela of a long segment dissection associated with an
intimal flap, and a mural hematoma. Associated significant narrowing
was noted.

However, more distally, the contrast was seen to the cranial skull
base into the left vertebrobasilar junction and the left
posterior-inferior cerebellar artery. Opacification of the basilar
artery which was also noted.

A 2.4 mm x 5 mm Emboshield device was then prepped and purged of air
in its housing. The device was then retrieved into the delivery
microcatheter.

The combination of the 014 inch and the delivery microcatheter was
retrieved from the housing. A J configuration was given to the tip
of the micro guidewire.

Under constant roadmap technique and constant fluoroscopic guidance,
the combination of the Emboshield delivery catheter, and the micro
guidewire was advanced to just proximal to the origin of the left
vertebral artery.

Using a torque device, access with the micro guidewire was first
obtained without contact with the underlying dissected segment. The
combination was then advanced without difficulty to the cranial
skull base to the level of C1-C2.

In the usual manner, the filter device was then deployed. The
delivery microcatheter was retrieved and removed while maintaining
constant positioning of tip of the micro guidewire.

Measurements were then performed of the proximal left vertebral
artery in its most normal segment. The left dissection was also
measured.

It was decided to proceed with placement of a 3.5 mm x 34 mm Onyx
Frontier balloon mountable stent.

This was prepped with 75% contrast and 25% heparinized saline
infusion antegradely, and with just heparinized saline infusion
retrogradely.

Thereafter using the rapid exchange technique, the combination was
advanced without difficulty to the proximal left vertebral artery.

The proximal and the distal landing zones were then identified. The
stent was then deployed by inflating the balloon mountable stent
using micro inflation syringe device via micro tubing to just over 3
mm where it was maintained for approximately 15 seconds. Thereafter,
the balloon was deflated and removed.

A control arteriogram performed through Neuron Max sheath just
proximal to the origin of the left vertebral artery now demonstrated
excellent apposition of the stent with wide patency of the left
vertebral artery proximally.

More distally, this contrast flow was noted into the left
vertebrobasilar junction, the basilar artery and the just
revascularized posterior cerebral arteries. No evidence of
intraluminal filling defects was seen extra cranially or
intracranially.

The filter capture device was then advanced again using the rapid
exchange technique to just the proximal marker on the device. The
filter device was then captured into the capture device by
retrieving on the micro guidewire.

The combination was then retrieved slowly under constant
fluoroscopic guidance ensuring no entanglement with the planted
stent in the proximal left vertebral artery.

Control arteriogram was then performed at 10 and 20 minutes post
deployment of the stent. These continued to demonstrate excellent
flow in the proximal left vertebral artery. Intracranially the TICI
2C revascularization was maintained.

Following this, the Neuron Max sheath was then retrieved and
removed. The 8 French sheath was removed with hemostasis achieved at
the right groin puncture site with a 6 French Angio-Seal closure
device. Distal pulses remained palpable in both feet unchanged. A CT
of the brain obtained demonstrated no evidence of intracranial
hemorrhage or hydrocephalus.

Prior to planting the stent at the second site in the proximal left
vertebral artery, patient was given 81 mg of aspirin, and 180 mg of
Brilinta via an orogastric tube.

Also right after stent placement, patient was given a bolus dose of
cangrelor followed by a low-dose four hour infusion.

Patient was left intubated on account of a pre procedural clinical
condition. He was then transferred to the neuro ICU for post
revascularization measures.
IMPRESSION: Status post endovascular revascularization of occluded distal
basilar artery and both posterior cerebral arteries as described
above with a 4 mm x 4 mm Solitaire X retrieval device and contact
aspiration achieving a TICI score of 2C.

Status post endovascular reconstruction of symptomatic dissected
proximal left vertebral artery with stent assisted angioplasty and
distal protection as described above.

PLAN:
Follow-up in the clinic 2 weeks post discharge.

## 2021-07-27 SURGERY — IR WITH ANESTHESIA
Anesthesia: General

## 2021-07-27 MED ORDER — ORAL CARE MOUTH RINSE
15.0000 mL | OROMUCOSAL | Status: DC
  Administered 2021-07-27 – 2021-07-29 (×17): 15 mL via OROMUCOSAL

## 2021-07-27 MED ORDER — POTASSIUM CHLORIDE 10 MEQ/100ML IV SOLN: 10.0000 meq | INTRAVENOUS | Status: AC

## 2021-07-27 MED ORDER — PHENYLEPHRINE HCL (PRESSORS) 10 MG/ML IV SOLN
INTRAVENOUS | Status: DC | PRN
  Administered 2021-07-27: 120 ug via INTRAVENOUS

## 2021-07-27 MED ORDER — THIAMINE HCL 100 MG/ML IJ SOLN
500.0000 mg | Freq: Three times a day (TID) | INTRAVENOUS | Status: AC
  Administered 2021-07-27 – 2021-07-28 (×5): 500 mg via INTRAVENOUS
  Filled 2021-07-27 (×6): qty 5

## 2021-07-27 MED ORDER — IOHEXOL 350 MG/ML SOLN
100.0000 mL | Freq: Once | INTRAVENOUS | Status: AC | PRN
  Administered 2021-07-27: 80 mL via INTRA_ARTERIAL

## 2021-07-27 MED ORDER — PROPOFOL BOLUS VIA INFUSION
50.0000 mg | Freq: Once | INTRAVENOUS | Status: AC
  Administered 2021-07-27: 50 mg via INTRAVENOUS
  Filled 2021-07-27: qty 50

## 2021-07-27 MED ORDER — IOHEXOL 350 MG/ML SOLN
60.0000 mL | Freq: Once | INTRAVENOUS | Status: AC | PRN
  Administered 2021-07-27: 60 mL via INTRAVENOUS

## 2021-07-27 MED ORDER — NITROGLYCERIN 1 MG/10 ML FOR IR/CATH LAB
INTRA_ARTERIAL | Status: AC
  Filled 2021-07-27: qty 10

## 2021-07-27 MED ORDER — HYDRALAZINE HCL 20 MG/ML IJ SOLN: 20.0000 mg | Freq: Four times a day (QID) | INTRAMUSCULAR | Status: DC | PRN

## 2021-07-27 MED ORDER — CANGRELOR BOLUS VIA INFUSION
15.0000 ug/kg | Freq: Once | INTRAVENOUS | Status: AC
  Administered 2021-07-27: 1430 ug via INTRAVENOUS
  Filled 2021-07-27: qty 1430

## 2021-07-27 MED ORDER — PHENYLEPHRINE HCL-NACL 20-0.9 MG/250ML-% IV SOLN
INTRAVENOUS | Status: DC | PRN
  Administered 2021-07-27: 25 ug/min via INTRAVENOUS

## 2021-07-27 MED ORDER — TIROFIBAN HCL IN NACL 5-0.9 MG/100ML-% IV SOLN
INTRAVENOUS | Status: AC
  Filled 2021-07-27: qty 100

## 2021-07-27 MED ORDER — VERAPAMIL HCL 2.5 MG/ML IV SOLN
INTRAVENOUS | Status: AC
  Filled 2021-07-27: qty 2

## 2021-07-27 MED ORDER — ASPIRIN 81 MG PO CHEW: 81.0000 mg | CHEWABLE_TABLET | Freq: Every day | ORAL | Status: DC

## 2021-07-27 MED ORDER — ACETAMINOPHEN 160 MG/5ML PO SOLN: 650.0000 mg | ORAL | Status: DC | PRN

## 2021-07-27 MED ORDER — STROKE: EARLY STAGES OF RECOVERY BOOK: Freq: Once | Status: AC

## 2021-07-27 MED ORDER — ACETAMINOPHEN 650 MG RE SUPP: 650.0000 mg | RECTAL | Status: DC | PRN

## 2021-07-27 MED ORDER — POTASSIUM CHLORIDE 20 MEQ PO PACK
40.0000 meq | PACK | Freq: Once | ORAL | Status: DC
  Filled 2021-07-27: qty 2

## 2021-07-27 MED ORDER — ATORVASTATIN CALCIUM 40 MG PO TABS
40.0000 mg | ORAL_TABLET | Freq: Every day | ORAL | Status: DC
  Administered 2021-07-27 – 2021-07-29 (×3): 40 mg
  Filled 2021-07-27 (×3): qty 1

## 2021-07-27 MED ORDER — CLOPIDOGREL BISULFATE 300 MG PO TABS
ORAL_TABLET | ORAL | Status: AC
  Filled 2021-07-27: qty 1

## 2021-07-27 MED ORDER — FENTANYL CITRATE (PF) 250 MCG/5ML IJ SOLN
INTRAMUSCULAR | Status: AC
  Filled 2021-07-27: qty 5

## 2021-07-27 MED ORDER — ROCURONIUM BROMIDE 100 MG/10ML IV SOLN
INTRAVENOUS | Status: DC | PRN
  Administered 2021-07-27: 30 mg via INTRAVENOUS
  Administered 2021-07-27: 70 mg via INTRAVENOUS
  Administered 2021-07-27: 40 mg via INTRAVENOUS

## 2021-07-27 MED ORDER — TICAGRELOR 90 MG PO TABS
90.0000 mg | ORAL_TABLET | Freq: Two times a day (BID) | ORAL | Status: DC
  Administered 2021-07-30 – 2021-08-05 (×12): 90 mg via ORAL
  Filled 2021-07-27 (×13): qty 1

## 2021-07-27 MED ORDER — TICAGRELOR 90 MG PO TABS
ORAL_TABLET | ORAL | Status: AC
  Administered 2021-07-27: 180 mg via OROGASTRIC
  Filled 2021-07-27: qty 2

## 2021-07-27 MED ORDER — CHLORHEXIDINE GLUCONATE CLOTH 2 % EX PADS
6.0000 | MEDICATED_PAD | Freq: Every day | CUTANEOUS | Status: DC
  Administered 2021-07-27 – 2021-08-01 (×5): 6 via TOPICAL

## 2021-07-27 MED ORDER — CHLORHEXIDINE GLUCONATE 0.12% ORAL RINSE (MEDLINE KIT)
15.0000 mL | Freq: Two times a day (BID) | OROMUCOSAL | Status: DC
  Administered 2021-07-27 – 2021-07-29 (×4): 15 mL via OROMUCOSAL

## 2021-07-27 MED ORDER — IOHEXOL 300 MG/ML  SOLN
100.0000 mL | Freq: Once | INTRAMUSCULAR | Status: AC | PRN
  Administered 2021-07-27: 30 mL via INTRA_ARTERIAL

## 2021-07-27 MED ORDER — THIAMINE HCL 100 MG/ML IJ SOLN: 100.0000 mg | Freq: Every day | INTRAMUSCULAR | Status: DC

## 2021-07-27 MED ORDER — CLEVIDIPINE BUTYRATE 0.5 MG/ML IV EMUL
0.0000 mg/h | INTRAVENOUS | Status: AC
  Administered 2021-07-27: 10 mg/h via INTRAVENOUS
  Administered 2021-07-27: 18 mg/h via INTRAVENOUS
  Administered 2021-07-27: 2 mg/h via INTRAVENOUS
  Administered 2021-07-27: 4 mg/h via INTRAVENOUS
  Filled 2021-07-27: qty 100
  Filled 2021-07-27 (×2): qty 50
  Filled 2021-07-27 (×2): qty 100

## 2021-07-27 MED ORDER — ACETAMINOPHEN 325 MG PO TABS
650.0000 mg | ORAL_TABLET | ORAL | Status: DC | PRN
  Administered 2021-08-01 – 2021-08-04 (×3): 650 mg via ORAL
  Filled 2021-07-27 (×3): qty 2

## 2021-07-27 MED ORDER — TICAGRELOR 90 MG PO TABS
90.0000 mg | ORAL_TABLET | Freq: Two times a day (BID) | ORAL | Status: DC
  Administered 2021-07-27 – 2021-08-01 (×6): 90 mg
  Filled 2021-07-27 (×10): qty 1

## 2021-07-27 MED ORDER — IOHEXOL 300 MG/ML  SOLN
100.0000 mL | Freq: Once | INTRAMUSCULAR | Status: AC | PRN
  Administered 2021-07-27: 100 mL via INTRA_ARTERIAL

## 2021-07-27 MED ORDER — INSULIN ASPART 100 UNIT/ML IJ SOLN: 0.0000 [IU] | INTRAMUSCULAR | Status: DC

## 2021-07-27 MED ORDER — SODIUM CHLORIDE 0.9 % IV SOLN: INTRAVENOUS | Status: DC | PRN

## 2021-07-27 MED ORDER — CANGRELOR TETRASODIUM 50 MG IV SOLR
INTRAVENOUS | Status: AC
  Filled 2021-07-27: qty 50

## 2021-07-27 MED ORDER — CEFAZOLIN SODIUM-DEXTROSE 2-4 GM/100ML-% IV SOLN
INTRAVENOUS | Status: AC
  Filled 2021-07-27: qty 100

## 2021-07-27 MED ORDER — CEFAZOLIN SODIUM-DEXTROSE 2-3 GM-%(50ML) IV SOLR
INTRAVENOUS | Status: DC | PRN
  Administered 2021-07-27: 2 g via INTRAVENOUS

## 2021-07-27 MED ORDER — POTASSIUM CHLORIDE 10 MEQ/100ML IV SOLN
10.0000 meq | INTRAVENOUS | Status: AC
  Administered 2021-07-27 (×4): 10 meq via INTRAVENOUS
  Filled 2021-07-27 (×4): qty 100

## 2021-07-27 MED ORDER — CANGRELOR TETRASODIUM 50 MG IV SOLR
2.0000 ug/kg/min | INTRAVENOUS | Status: AC
Start: 2021-07-27 — End: 2021-07-27
  Administered 2021-07-27 (×2): 2 ug/kg/min via INTRAVENOUS
  Filled 2021-07-27 (×2): qty 50

## 2021-07-27 MED ORDER — THIAMINE HCL 100 MG/ML IJ SOLN
250.0000 mg | Freq: Every day | INTRAVENOUS | Status: DC
  Administered 2021-07-29 – 2021-07-31 (×3): 250 mg via INTRAVENOUS
  Filled 2021-07-27 (×4): qty 2.5

## 2021-07-27 MED ORDER — LABETALOL HCL 5 MG/ML IV SOLN
20.0000 mg | INTRAVENOUS | Status: DC | PRN
  Administered 2021-07-27: 20 mg via INTRAVENOUS
  Filled 2021-07-27: qty 4

## 2021-07-27 MED ORDER — EPTIFIBATIDE 20 MG/10ML IV SOLN
INTRAVENOUS | Status: AC
  Filled 2021-07-27: qty 10

## 2021-07-27 MED ORDER — ASPIRIN 81 MG PO CHEW
CHEWABLE_TABLET | ORAL | Status: AC
  Filled 2021-07-27: qty 1

## 2021-07-27 MED ORDER — SODIUM CHLORIDE 0.9 % IV SOLN: INTRAVENOUS | Status: DC

## 2021-07-27 MED ORDER — ASPIRIN 81 MG PO CHEW
81.0000 mg | CHEWABLE_TABLET | Freq: Every day | ORAL | Status: DC
  Administered 2021-07-29: 81 mg
  Filled 2021-07-27: qty 1

## 2021-07-27 MED ORDER — FENTANYL CITRATE (PF) 250 MCG/5ML IJ SOLN
INTRAMUSCULAR | Status: DC | PRN
Start: 2021-07-27 — End: 2021-07-27
  Administered 2021-07-27: 100 ug via INTRAVENOUS

## 2021-07-27 NOTE — Progress Notes (Signed)
Patient not available for Stat EEG , will be moving to 4N per nurse. Will attempt set up on 4N

## 2021-07-27 NOTE — Progress Notes (Signed)
OT Cancellation Note  Patient Details Name: Roy [name] MRN: [id number] DOB: 1996-01-18   Cancelled Treatment:    Reason Eval/Treat Not Completed: Patient not medically ready (intubated and RN requesting hold at this time)  Wynona Neat, OTR/L  Acute Rehabilitation Services Pager: [phone] Office: [phone] .  07/27/2021, 10:46 AM

## 2021-07-27 NOTE — Progress Notes (Signed)
Patient transported to CT and back to 4N23 without any apparent complications.

## 2021-07-27 NOTE — Procedures (Signed)
S/P bilateral vert  artery and RT common carotid arteriograms . RT CFA approach. S/P revascularization of occuded distal basilar artery and both PCAs with x1 pass with 40 mm solitaireX retriever and contact  Aspiration achieving a TICI 2C revascularization.  S/P stent assisted angioplasty for symptomatic prox LT VA  Dissection with distal protection.  Post CT brain no ICH or hydrocephalus. 45F angioseal for RT CFA hemostasis. Distal pulses all intact bilaterally.  Patient left intubated.  Meds . Loaded with aspirin 81mg  and brilinta 180 mg via  OG prior to  Lt VA dissection treatment. Given loading dose of cangrelor at 4.40 am followed by a 4 hr low dose infusion. Stop IV infusion at 830 am. And obtain a stat CT brain without. Please inform the  neurohospitalist . S.Kenzlei Runions MD

## 2021-07-27 NOTE — Consult Note (Addendum)
NEUROLOGY CONSULTATION NOTE   Date of service: July 27, 2021 Patient Name: Roy [name] MRN:  [id number] DOB:  1996/03/15 Reason for consult: "Concern for seizures" Requesting Provider: Deland Pretty, * _ _ _   _ __   _ __ _ _  __ __   _ __   __ _  History of Present Illness  Roy [name] is a 25 y.o. male with no significant PMH who presents with shaking and unresponsiveness. Patient is intubated and unable to provide any history. Most of the history obtained from sister and fiance over phone. Per fiance, he has been reporting that his head hurts for the last 3-4 days along with feeling cold. Today, they smoked marijuana and he took 4 shots of Vodka. They were having intercourse when he fell out and the left side of his body went numb. He was sweating profusely and kept saying "my brain" and then started having seizures. This happened about 30 mins to an hours after taking marijuana. No prior hx of seizures. He has been reporting headache for the last 3-4 days. Today started throwing up. He threw up a lot today. Spends a lot of time in a shed but comes to sister's home every night to sleep. The shed does not have proper ventilation, it is full of stuff but nothing toxic that sister or fiance is aware of. No obvious exposure to Carbon Monoxide. No fever, no chills but has been feeling cold.  The fiance immediately called EMS for concern for seizure like activity. Per notes, the seizure like activity stopped enroute by the time EMS got an IV so he was not given any Versed. It resumed in the ED and he was given ativan 4mg , Keppra 2G in the ED with persistent shaking and was intubated with rocuronium and etomidate and started on propofol.  On my evaluation, he has tongue movements and twitching along with jerks mostly in the LUE and RLE. Jerking appears arhythmic on my evaluation and ?myoclonic.  No recent water fasts, he is not a regular drinker, has used marijuana in the past without  any issues. Fiance and another friend also smoked the same marijuana without any issues. He was fine all day otherwise. No recent head injury with loss of consciousness, no family history of seizures.   ROS   Unable to obtain ROS given intubation and sedation.  Past History  History reviewed. No pertinent past medical history. History reviewed. No pertinent surgical history. No family history on file. Social History   Socioeconomic History   Marital status: Single    Spouse name: Not on file   Number of children: Not on file   Years of education: Not on file   Highest education level: Not on file  Occupational History   Not on file  Tobacco Use   Smoking status: Not on file   Smokeless tobacco: Not on file  Substance and Sexual Activity   Alcohol use: Not on file   Drug use: Not on file   Sexual activity: Not on file  Other Topics Concern   Not on file  Social History Narrative   Not on file   Social Determinants of Health   Financial Resource Strain: Not on file  Food Insecurity: Not on file  Transportation Needs: Not on file  Physical Activity: Not on file  Stress: Not on file  Social Connections: Not on file   No Known Allergies  Medications  (Not in a hospital admission)  Vitals   Vitals:   07/26/21 2237 07/26/21 2245 07/26/21 2255 07/26/21 2347  BP: 91/64   (!) 125/95  Pulse:   (!) 114 96  Resp:   20 (!) 25  Temp:      TempSrc:      SpO2:   99% 100%  Weight:  95.3 kg       Body mass index is 38.41 kg/m.  Physical Exam about 30 mins after he was given rocuronium and etomidate and is on 79mcg/kg/mins of propofol   General: appears obese, warm to touch. HENT: Normal oropharynx and mucosa. Normal external appearance of ears and nose. Neck: Supple, no pain or tenderness CV: No JVD. No peripheral edema. Pulmonary: Symmetric Chest rise. Breathing over vent. Abdomen: Soft to touch, non-tender.  Ext: No cyanosis, edema, or deformity  Skin: No  rash. Normal palpation of skin.   Musculoskeletal: Normal digits and nails by inspection. No clubbing.   Neurologic Examination  Mental status/Cognition: intubated, no response to voice or loud clap. No response to noxious stimuli. Speech/language: intubated Cranial nerves/brainstem: Pupils 66mm, equal round and reactive to light Corneals: weakly positive BL Cough: absent Gag: absent.   Motor:  Muscle bulk: normal, intermittent arhythmic twitching of tongue, LUE and RLE about once every couple secs and the movements in LUE and RLE appear out of synch.  Does not withdraw to noxious stimuli in any extremities.  Reflexes:  Right Left Comments  Pectoralis      Biceps (C5/6) 2 2   Brachioradialis (C5/6) 2 2    Triceps (C6/7) 2 2    Patellar (L3/4) 3 3    Achilles (S1) 3 3 2-3 beats of clonus BL   Hoffman      Plantar     Jaw jerk    Sensation: No response to pinch in any extremities  Coordination/Complex Motor:  Unable to assess.  Labs   CBC:  Recent Labs  Lab 07/26/21 2323  HGB 13.3  HCT 123XX123    Basic Metabolic Panel:  Lab Results  Component Value Date   NA 141 07/26/2021   K 2.7 (LL) 07/26/2021   Lipid Panel: No results found for: LDLCALC HgbA1c: No results found for: HGBA1C Urine Drug Screen: No results found for: LABOPIA, COCAINSCRNUR, LABBENZ, AMPHETMU, THCU, LABBARB  Alcohol Level No results found for: [place]  CT Head without contrast: Personally reviewed and CTH was negative for a large hypodensity concerning for a large territory infarct or hyperdensity concerning for an ICH  CT angio Head and Neck with contrast: pending  CT Venogram: - pending.  MRI Brain: pending  cEEG:  Pending.  Impression   Roy [name] is a 25 y.o. male with no significant PMH who presents with 3 days hx of holocephalic headache, copious vomiting x 1 day at home and episode of headache + slurring of his speech with Left sided numbness during intercourse followed by a  fall and then persistent arrhythmic shaking and unresponsiveness. Continues to have arhythmic shaking that almost appears myoclonic despite ativan 4mg , Keppra 4grams, Etomidate for intubation and Propofol 50mg  bolus and 69mcg/Kg/min.  Unclear cause for his acute unresponsivness and myoclonic appearing movements at this time. Could be myoclonic seizures but unclear triggers, he does not have a personal or family history of seizures. Perhaps could have been triggered by marijuana specially if it was laced with a stimulant or from electrolyte imbalance in the setting of copious vomiting today. Will also rule out basilar thrombus given left sided numbness and  slurred speech prior to unresponsiveness today, sinus venous thrombosis specially given reported headaches x 3 days and the episode today happened after intercourse(?valsava). The presentation is very odd and certainly status epilepticus is the primary differential but still hard to understand why it would be so refractory to treatment despite multiple AEDs.  Cbc is pending but he does not have a fever, except for headache no other symptoms that he endorsed to his fiance or sister. If he has leukocytosis or develops a fever, will give him a one time dose of Vanc and Ceftriaxone to cover for meningitis. If the labs are non revealing, will need an LP, MRI Brain for further workup.  Recommendations  - Has been given Keppra 4G IV, Ativan 6mg  IV propofol 50mg  bolus and propofol 70mcg/kg/min infusion. - STAT LTM ordered and tech notified to make this a priority case. Also notified to use MRI safe leads. Further AEDs based on cEEG. Unclear if these movements actually are seizures given that these are arhythmic on my evaluation. - STAT CT Angio head and neck with CT Venogram head. - Serum Ammonia, EtOh levels, UDS, serum drug screen, HIV, folate, TSH, B12, MMA, vit B1, co-oximetry panel, CBC, chemistry, RPR.  - Thiamine Wernicke's dosing ordered. - seizure  precautions. - MRI Brain with and without contrast after hooked up on LTM. - May need an LP if the workup above is nonrevealing. ______________________________________________________________________  Addendum: Personally reviewed CT Angio head and neck and discussed with Neuroradiology. Notable for a left Vertebral artery V1 dissection with an overlying thrombus. In addition, imaging notable for a distal basilar thrombus, R PCA P2 occlusion and Left PCA P1 occlusion. A code stroke was initiated and case discussed with Dr. Estanislado Pandy for evaluation for potential thrombectomy. Dr. Estanislado Pandy discussed risks and benefits and the need for thrombectomy with patient's sister Janean Sark over the phone with the fiance listening in over the speaker. The sister consented for the surgery and he was taken to IR.  LKW per discussion with patient's fiance was 2100 on 07/26/21. This puts him just outside of the 4.5 hours window for tNK. Baseline mRS is 0.  NIHSS components Score: Comment  1a Level of Conscious 0[]  1[]  2[]  3[x]      1b LOC Questions 0[]  1[]  2[x]       1c LOC Commands 0[]  1[]  2[x]       2 Best Gaze 0[x]  1[]  2[]       3 Visual 0[x]  1[]  2[]  3[]      4 Facial Palsy 0[x]  1[]  2[]  3[]      5a Motor Arm - left 0[]  1[]  2[]  3[]  4[x]  UN[]    5b Motor Arm - Right 0[]  1[]  2[]  3[]  4[x]  UN[]    6a Motor Leg - Left 0[]  1[]  2[]  3[]  4[x]  UN[]    6b Motor Leg - Right 0[]  1[]  2[]  3[]  4[x]  UN[]    7 Limb Ataxia 0[x]  1[]  2[]  3[]  UN[]     8 Sensory 0[]  1[]  2[x]  UN[]      9 Best Language 0[]  1[]  2[]  3[x]      10 Dysarthria 0[]  1[]  2[]  UN[x]      11 Extinct. and Inattention 0[x]  1[]  2[]       TOTAL: 28    Will hold off on LTM until after thrombectomy.  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  Plan: - Frequent Neuro checks per stroke unit protocol - Brain imaging with MRI Brain without contrast - TTE - Lipid  panel with LDL - Will start start statin if LDL > 70 - HbA1c - Antithrombotic - per Neuro IR after thrombectomy. - DVT ppx - SBP goal - per Neuro IR for the first 24 hours after thrombectomy. - Telemetry monitoring for arrythmia - Bedside swallow screen prior to PO intake. - Stroke education booklet - PT/OT/SLP consult  This patient is critically ill and at significant risk of neurological worsening, death and care requires constant monitoring of vital signs, hemodynamics,respiratory and cardiac monitoring, neurological assessment, discussion with family, other specialists and medical decision making of high complexity. I spent 100 minutes of neurocritical care time  in the care of  this patient. This was time spent independent of any time provided by nurse practitioner or PA.  Erick Blinks [facility] Pager Number [phone] 07/27/2021  1:11 AM  Signed,  Erick Blinks [facility] Pager Number [phone] _ _ _   _ __   _ __ _ _  __ __   _ __   __ _

## 2021-07-27 NOTE — Anesthesia Preprocedure Evaluation (Signed)
Anesthesia Evaluation   Patient unresponsive    Reviewed: Allergy & Precautions, Patient's Chart, lab work & pertinent test results, Unable to perform ROS - Chart review onlyPreop documentation limited or incomplete due to emergent nature of procedure.  History of Anesthesia Complications Negative for: history of anesthetic complications  Airway Mallampati: Intubated       Dental   Pulmonary   Intubated           Cardiovascular      Neuro/Psych Seizures -,  CVA, Residual Symptoms    GI/Hepatic (+)     substance abuse  alcohol use and marijuana use,   Endo/Other   Hypokalemia, K 2.8   Renal/GU      Musculoskeletal   Abdominal   Peds  Hematology   Anesthesia Other Findings   Reproductive/Obstetrics                             Anesthesia Physical Anesthesia Plan  ASA: 4 and emergent  Anesthesia Plan: General   Post-op Pain Management:    Induction: Inhalational  PONV Risk Score and Plan: 2 and Treatment may vary due to age or medical condition  Airway Management Planned: Oral ETT  Additional Equipment: Arterial line  Intra-op Plan:   Post-operative Plan: Post-operative intubation/ventilation  Informed Consent:     History available from chart only  Plan Discussed with: CRNA and Anesthesiologist  Anesthesia Plan Comments:         Anesthesia Quick Evaluation

## 2021-07-27 NOTE — Progress Notes (Addendum)
During morning rounds with the stroke team we turned off patient's fentanyl and propofol gtt for a WUA. After 15 minutes off of all sedation patient began having spontaneous movement in all extremities and slightly opened eyes to voice but did not follow commands. Patient SBP then climbed to 170-180 and then max on cleviprex gtt. Patient restarted on fentanyl and propofol for agitation, coughing over ventilator, and BP control. Current BP parameters for patient being post IR is 120-140. Spoke with Dr. Pearlean Brownie in patient needing MRI sooner than originally ordered for tomorrow morning. Patient currently on too many gtts and too agitated to take to MRI, but will retry ASAP. Dr Pearlean Brownie and stroke team notified.  Sherral Hammers, RN

## 2021-07-27 NOTE — Progress Notes (Signed)
EEG completed, results pending. 

## 2021-07-27 NOTE — Progress Notes (Signed)
Patient transported to MRI and back to 4N23 without any apparent complications.

## 2021-07-27 NOTE — Progress Notes (Signed)
Patient transported on ventilator from 4N23 to IR with no complications.

## 2021-07-27 NOTE — Progress Notes (Signed)
NAME:  Roy [name], MRN:  [id number], DOB:  January 09, 1996, LOS: 1 ADMISSION DATE:  07/26/2021 CONSULTATION DATE:  07/26/2021 REFERRING MD:  Deretha Emory - EDP CHIEF COMPLAINT:  AMS, ?Seizure   History of Present Illness:  25 year old man who presented to [facility] ED via [facility] 11/7 for AMS. No significant PMHx. Per chart review, patient was found altered in his shed at home and movements concerning for seizure activity were noted. Patient vomited x 3 en route with EMS. Active EtOH and THC use per family. No known history of seizure activity.  On ED arrival, patient was reportedly sweating profusely and unable to follow commands. Vitals were notable for tachycardia (117) and hypotension (91/64); SpO2 99% on RA. CBG 128. Given persistent AMS and inability to protect airway, patient was intubated in ED. CT Head was completed with NAICA. Neuro was consulted for STAT EEG and evaluation. Keppra load completed and Ativan 4mg  total administered.  PCCM consulted for admission.  Pertinent Medical History:  EtOH, marijuana use  Significant Hospital Events: Including procedures, antibiotic start and stop dates in addition to other pertinent events   11/7 - Presented to [facility] ED via [facility] for AMS. Found altered in shed at home. C/f seizure activity, vomited x 3 en route. Persistently altered/combative in ED requiring restraints and intubation for airway protection.  Interim History / Subjective:  Patient is still sedated, and intubated.  Angio-Seal in place  Objective:  Blood pressure 113/70, pulse 97, temperature 99.1 F (37.3 C), temperature source Axillary, resp. rate 20, height 5\' 2"  (1.575 m), weight 95.3 kg, SpO2 100 %.    Vent Mode: PRVC FiO2 (%):  [40 %-100 %] 40 % Set Rate:  [18 bmp] 18 bmp Vt Set:  [430 mL-500 mL] 430 mL PEEP:  [5 cmH20] 5 cmH20 Plateau Pressure:  [19 cmH20-20 cmH20] 20 cmH20   Intake/Output Summary (Last 24 hours) at 07/27/2021 1514 Last data filed at 07/27/2021 1400 Gross per 24  hour  Intake 2672.98 ml  Output 1600 ml  Net 1072.98 ml   Filed Weights   07/26/21 2245  Weight: 95.3 kg   Physical Examination: General: Acutely ill-appearing young adult man, orally intubated HEENT: [place]/AT, anicteric sclera, mildly injected. Pupils equal and pinpoint 8mm, sluggishly reactive (sedated, s/p intubation meds), moist mucous membranes. ETT in place. Neuro: Sedated. Does not respond to verbal, tactile or noxious stimuli. Not following commands. Spontaneous movement of extremities noted prior to sedation, none noted on my exam. CV: Regular rate and rhythm, no m/g/r. PULM: Breathing even and unlabored on vent (PEEP 5, FiO2 100%). Lung fields CTAB anteriorly. GI: Obese, soft, nontender, nondistended. Normoactive bowel sounds. Extremities: No LE edema noted. Skin: Warm/dry, no rashes.  Resolved Hospital Problem List:    Assessment & Plan:  Acute toxic encephalopathy, presumed 2/2 substance use Found altered at home, per family EtOH and THC use. Watch for signs of withdrawal Avoid deep sedation Continue Thiamine, MV  Left vertebral dissection s/p stent assisted angioplasty Acute basilar artery stroke, extending into both PCA s/p thrombectomy Patient underwent bilateral common carotid and vertebral angiogram, confirmed left vertebral artery dissection, stent was placed, he had thrombectomy of basilar artery and PCAs with TICI 2C result S/p cangrelor load and infusion Continue aspirin and Brilinta Continue neuro watch MRI brain is pending Stroke team is following   Concern for seizure activity Concern for seizure activity noted en route with EMS; per ED RN no rhythmic motion noted but described as "spastic" movements and "foaming at the mouth". No  history of seizures.  Neurology is following, s/p Keppra load EEG is pending  Acute hypoxemic respiratory failure secondary to encephalopathy Continue full vent support (4-8cc/kg IBW)  Wean FiO2 for O2 sat > 90% Will try  spontaneous breathing trial post MRI VAP bundle Pulmonary hygiene PAD protocol for sedation: Propofol and Fentanyl for goal RASS 0 to -1  Hypokalemia, resolved  Closely monitor electrolytes  Morbid obesity Dietitian is following  Best Practice: (right click and "Reselect all SmartList Selections" daily)   Diet/type: NPO w/ meds via tube DVT prophylaxis: prophylactic heparin  GI prophylaxis: PPI Lines: N/A Foley:  N/A Code Status:  full code Last date of multidisciplinary goals of care discussion [11/8: Patient's sister and fianc was updated at bedside]  Labs:  CBC: Recent Labs  Lab 07/26/21 2323 07/27/21 0031 07/27/21 0046 07/27/21 0500 07/27/21 1223  WBC  --  19.0*  --  13.9*  --   NEUTROABS  --  15.0*  --   --   --   HGB 13.3 13.2 13.6 11.9* 11.9*  HCT 39.0 39.9 40.0 34.6* 35.0*  MCV  --  91.9  --  89.4  --   PLT  --  179  --  319  --     Basic Metabolic Panel: Recent Labs  Lab 07/26/21 2323 07/27/21 0031 07/27/21 0046 07/27/21 0500 07/27/21 1223  NA 141 139 140 138 140  K 2.7* 3.0* 2.9* 3.8 3.7  CL  --  107 110 109  --   CO2  --  18*  --  20*  --   GLUCOSE  --  112* 115* 103*  --   BUN  --  11 11 9   --   CREATININE  --  1.13 1.00 1.00  --   CALCIUM  --  8.5*  --  8.1*  --   MG  --  1.7  --  1.9  --   PHOS  --   --   --  4.1  --    GFR: Estimated Creatinine Clearance: 112.3 mL/min (by C-G formula based on SCr of 1 mg/dL). Recent Labs  Lab 07/27/21 0031 07/27/21 0500 07/27/21 0606  WBC 19.0* 13.9*  --   LATICACIDVEN 4.4*  --  2.8*    Liver Function Tests: Recent Labs  Lab 07/27/21 0031  AST 38  ALT 38  ALKPHOS 59  BILITOT 0.6  PROT 6.7  ALBUMIN 3.6   No results for input(s): LIPASE, AMYLASE in the last 168 hours. Recent Labs  Lab 07/27/21 0031  AMMONIA 58*    ABG:    Component Value Date/Time   PHART 7.370 07/27/2021 1223   PCO2ART 39.7 07/27/2021 1223   PO2ART 96 07/27/2021 1223   HCO3 22.9 07/27/2021 1223   TCO2 24  07/27/2021 1223   ACIDBASEDEF 2.0 07/27/2021 1223   O2SAT 97.0 07/27/2021 1223     Coagulation Profile: No results for input(s): INR, PROTIME in the last 168 hours.  Cardiac Enzymes: No results for input(s): CKTOTAL, CKMB, CKMBINDEX, TROPONINI in the last 168 hours.  HbA1C: Hgb A1c MFr Bld  Date/Time Value Ref Range Status  07/27/2021 05:00 AM 5.7 (H) 4.8 - 5.6 % Final    Comment:    (NOTE) Pre diabetes:          5.7%-6.4%  Diabetes:              >6.4%  Glycemic control for   <7.0% adults with diabetes     CBG: Recent Labs  Lab 07/26/21 2257 07/27/21 0744 07/27/21 1136  GLUCAP 128* 87 97    Critical care time:    Total critical care time: 49 minutes  Performed by: Cheri Fowler   Critical care time was exclusive of separately billable procedures and treating other patients.   Critical care was necessary to treat or prevent imminent or life-threatening deterioration.   Critical care was time spent personally by me on the following activities: development of treatment plan with patient and/or surrogate as well as nursing, discussions with consultants, evaluation of patient's response to treatment, examination of patient, obtaining history from patient or surrogate, ordering and performing treatments and interventions, ordering and review of laboratory studies, ordering and review of radiographic studies, pulse oximetry and re-evaluation of patient's condition.   Cheri Fowler MD [facility] See Amion for pager If no response to pager, please call [phone] until 7pm After 7pm, Please call E-link [phone]

## 2021-07-27 NOTE — Progress Notes (Addendum)
In theSTROKE TEAM PROGRESS NOTE   INTERVAL HISTORY No one is at the bedside at time of this exam. Spoke with patient's fiancee regarding plan of  care via telephone.  History as per fianc patient was having headaches for last 3 days and not getting good relief with Tylenol.  Yesterday after having a marijuana as well as alcohol they had sex and shortly thereafter he complained of leaning backwards and losing his balance.  Subsequently developed slurred speech and nausea and became less responsive.  EMS noted him to be trembling he was intubated for airway protection.  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  He underwent emergent left vertebral artery angioplasty and stenting followed with successful mechanical thrombectomy and revascularization.  He has remained agitated and tremulous requiring ongoing sedation.  Postprocedure CT scan shows no acute abnormality, infarct or hemorrhage. Vitals:   07/27/21 1149 07/27/21 1200 07/27/21 1300 07/27/21 1400  BP: 136/64 119/71 115/66 113/70  Pulse:  (!) 102 97 97  Resp:  Temp:  99.1 F (37.3 C)    TempSrc:  Axillary    SpO2: 100% 99% 99% 100%  Weight:      Height:  (1.575 m)      CBC:  Recent Labs  Lab 07/27/21 0031 07/27/21 0046 07/27/21 0500 07/27/21 1223  WBC 19.0*  --  13.9*  --   NEUTROABS 15.0*  --   --   --   HGB 13.2   < > 11.9* 11.9*  HCT 39.9   < > 34.6* 35.0*  MCV 91.9  --  89.4  --   PLT 179  --  319  --    < > = values in this interval not displayed.   Basic Metabolic Panel:  Recent Labs  Lab 07/27/21 0031 07/27/21 0046 07/27/21 0500 07/27/21 1223  NA 139 140 138 140  K 3.0* 2.9* 3.8 3.7  CL 107 110 109  --   CO2 18*  --  20*  --   GLUCOSE 112* 115* 103*  --   BUN --   CREATININE 1.13 1.00 1.00  --   CALCIUM 8.5*  --  8.1*  --   MG 1.7  --  1.9  --   PHOS  --   --  4.1  --     Lipid Panel:  Recent  Labs  Lab 07/27/21 0500  TRIG 151*     HgbA1c:  Recent Labs  Lab 07/27/21 0500  HGBA1C 5.7*   Urine Drug Screen:  Recent Labs  Lab 07/27/21 0606  LABOPIA NONE DETECTED  COCAINSCRNUR NONE DETECTED  LABBENZ NONE DETECTED  AMPHETMU NONE DETECTED  THCU POSITIVE*  LABBARB NONE DETECTED    Alcohol Level  Recent Labs  Lab 07/27/21 0031  ETH <10    IMAGING past 24 hours CT ANGIO HEAD W OR WO CONTRAST  Result Date: 07/27/2021 CLINICAL DATA:  Initial evaluation for acute altered mental status, evaluate for basilar thrombus. EXAM: CT ANGIOGRAPHY HEAD AND NECK WITH CONTRAST CT VENOGRAM HEAD WITH CONTRAST TECHNIQUE: Multidetector CT imaging of the head and neck was performed using the standard protocol during bolus administration of intravenous contrast. Multiplanar CT image reconstructions and MIPs were obtained to evaluate the vascular anatomy. Carotid stenosis measurements (when applicable) are obtained utilizing NASCET criteria, using the distal internal carotid diameter as the denominator. CONTRAST:  60mL OMNIPAQUE IOHEXOL 350 MG/ML  SOLN COMPARISON:  Prior noncontrast head CT from 07/26/2021./ FINDINGS: CTA NECK FINDINGS Aortic arch: Visualized aortic arch normal in caliber with normal branch pattern. No stenosis or other abnormality about the origin of the great vessels. Right carotid system: Right common and internal carotid arteries widely patent without stenosis, dissection or occlusion. Left carotid system: Left common and internal carotid arteries widely patent without stenosis, dissection or occlusion. Vertebral arteries: Both vertebral arteries arise from the subclavian arteries. No proximal subclavian artery stenosis. Right vertebral artery widely patent within the neck without stenosis or other abnormality. On the left, there is intimal irregularity with intraluminal filling defect involving the proximal left V1 segment, concerning for acute dissection (series 7, image 130). Area of  involvement begins at or just beyond the origin, and extends approximately 2.3 cm in length. Associated moderate to severe stenosis proximally (series 5, image 271). Distally, the left vertebral artery is otherwise widely patent to the skull base. Skeleton: No visible acute osseous finding. No discrete or worrisome osseous lesions. Other neck: No other acute soft tissue abnormality within the neck. Endotracheal and enteric tubes in place. Upper chest: Mild atelectatic changes noted dependently within the visualized lungs. Visualized upper chest demonstrates no other acute finding. Review of the MIP images confirms the above findings CTA HEAD FINDINGS Anterior circulation: Both internal carotid arteries widely patent to the termini without stenosis. A1 segments widely patent. Normal anterior communicating artery complex. Both anterior cerebral arteries widely patent to their distal aspects without stenosis. No M1 stenosis or occlusion. Normal MCA bifurcations. Distal MCA branches well perfused and symmetric. Posterior circulation: Both V4 segments patent to the vertebrobasilar junction without stenosis or other abnormality. Both PICA origins patent and normal. Basilar widely patent proximally. There is some a subtle irregular filling defect involving the distal basilar artery/basilar tip, concerning for intraluminal thrombus (series 7, image 128). Secondary mild to moderate narrowing at the basilar tip. Neither superior cerebellar artery well visualized at the origin., and could be occluded. Irregular partially occlusive filling defect seen involving the left P1 segment with resultant moderate to severe stenosis (series 7, image 129). Left PCA otherwise widely patent distally. On the right, there is acute occlusion at the distal right P2 segment (series 7, image 141). Perfusion within right PCA branches distally could be related to subocclusive thrombus and/or collateralization. Venous sinuses: Dedicated venogram  images were performed. Normal enhancement seen throughout the superior sagittal sinus to the level of the torcula. Transverse and sigmoid sinuses are patent bilaterally. Proximal internal jugular veins patent. Straight sinus, vein of Galen, internal cerebral veins, and basal veins of Rosenthal are patent. No visible abnormality about the cavernous sinus. Superior orbital veins grossly symmetric and within normal limits. No evidence for dural sinus thrombosis. No appreciable cortical venous thrombosis. Anatomic variants: None significant.  No aneurysm. Review of the MIP images confirms the above findings IMPRESSION: 1. 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 2. Subtle irregular nonocclusive filling defect involving the distal basilar artery/basilar tip, consistent with associated distal embolic disease. Adjacent superior cerebellar arteries are not visualized at their origins, and could be occluded. 3. Associated partially occlusive thrombus with severe stenosis involving the left P1 segment. 4. Additional distal right P2 embolic occlusion. 5. Wide patency of both carotid artery systems and anterior circulation. 6. Negative CT venogram.  No evidence for dural sinus thrombosis. Critical Value/emergent results were called by telephone at the time of interpretation on 07/27/2021 at  1:26 am to provider [facility] , who verbally acknowledged these results. Electronically Signed   By: Rise Mu M.D.   On: 07/27/2021 02:08   DG Pelvis 1-2 Views  Result Date: 07/27/2021 CLINICAL DATA:  Stroke EXAM: PELVIS - 1-2 VIEW COMPARISON:  CT angiography head 07/27/2021 FINDINGS: Approximally 17 cm oval opacity overlying the pelvis likely represents a contrast filled urinary bladder due to recent intravenous contrast administration. Markedly limited evaluation due to overlapping osseous structures and overlying soft  tissues. There is no evidence of pelvic fracture or diastasis. No pelvic bone lesions are seen. IMPRESSION: Urinary bladder lumen filled with excreted intravenous contrast. Electronically Signed   By: Tish Frederickson M.D.   On: 07/27/2021 15:14   CT HEAD WO CONTRAST ( )  Result Date: 07/27/2021 CLINICAL DATA:  Follow-up suspected intracranial hemorrhage EXAM: CT HEAD WITHOUT CONTRAST TECHNIQUE: Contiguous axial images were obtained from the base of the skull through the vertex without intravenous contrast. COMPARISON:  None. FINDINGS: Brain: No evidence of acute infarction, hemorrhage, hydrocephalus, extra-axial collection or mass lesion/mass effect. Vascular: No hyperdense vessel or unexpected calcification. Skull: Normal. Negative for fracture or focal lesion. Sinuses/Orbits: No acute finding. Other: None. IMPRESSION: No acute intracranial pathology. No evidence of intracranial hemorrhage. Electronically Signed   By: Jearld Lesch M.D.   On: 07/27/2021 09:03   CT Head Wo Contrast  Result Date: 07/26/2021 CLINICAL DATA:  Seizure. EXAM: CT HEAD WITHOUT CONTRAST TECHNIQUE: Contiguous axial images were obtained from the base of the skull through the vertex without intravenous contrast. COMPARISON:  None. FINDINGS: Brain: No evidence of acute infarction, hemorrhage, hydrocephalus, extra-axial collection or mass lesion/mass effect. Vascular: No hyperdense vessel or unexpected calcification. Skull: Normal. Negative for fracture or focal lesion. Sinuses/Orbits: There is mucoperiosteal thickening of paranasal sinuses and complete opacification of the nasal passages. No air-fluid. The mastoid air cells are clear. Other: None IMPRESSION: 1. Normal noncontrast CT of the brain. 2. Paranasal sinus disease. Electronically Signed   By: Elgie Collard M.D.   On: 07/26/2021 23:51   CT ANGIO NECK W OR WO CONTRAST  Result Date: 07/27/2021 CLINICAL DATA:  Initial evaluation for acute altered mental status, evaluate for  basilar thrombus. EXAM: CT ANGIOGRAPHY HEAD AND NECK WITH CONTRAST CT VENOGRAM HEAD WITH CONTRAST TECHNIQUE: Multidetector CT imaging of the head and neck was performed using the standard protocol during bolus administration of intravenous contrast. Multiplanar CT image reconstructions and MIPs were obtained to evaluate the vascular anatomy. Carotid stenosis measurements (when applicable) are obtained utilizing NASCET criteria, using the distal internal carotid diameter as the denominator. CONTRAST:  71mL OMNIPAQUE IOHEXOL 350 MG/ML SOLN COMPARISON:  Prior noncontrast head CT from 07/26/2021./ FINDINGS: CTA NECK FINDINGS Aortic arch: Visualized aortic arch normal in caliber with normal branch pattern. No stenosis or other abnormality about the origin of the great vessels. Right carotid system: Right common and internal carotid arteries widely patent without stenosis, dissection or occlusion. Left carotid system: Left common and internal carotid arteries widely patent without stenosis, dissection or occlusion. Vertebral arteries: Both vertebral arteries arise from the subclavian arteries. No proximal subclavian artery stenosis. Right vertebral artery widely patent within the neck without stenosis or other abnormality. On the left, there is intimal irregularity with intraluminal filling defect involving the proximal left V1 segment, concerning for acute dissection (series 7, image 130). Area of involvement begins at or just beyond the origin, and extends approximately 2.3 cm in length. Associated moderate to severe stenosis proximally (series 5, image 271). Distally,  the left vertebral artery is otherwise widely patent to the skull base. Skeleton: No visible acute osseous finding. No discrete or worrisome osseous lesions. Other neck: No other acute soft tissue abnormality within the neck. Endotracheal and enteric tubes in place. Upper chest: Mild atelectatic changes noted dependently within the visualized lungs.  Visualized upper chest demonstrates no other acute finding. Review of the MIP images confirms the above findings CTA HEAD FINDINGS Anterior circulation: Both internal carotid arteries widely patent to the termini without stenosis. A1 segments widely patent. Normal anterior communicating artery complex. Both anterior cerebral arteries widely patent to their distal aspects without stenosis. No M1 stenosis or occlusion. Normal MCA bifurcations. Distal MCA branches well perfused and symmetric. Posterior circulation: Both V4 segments patent to the vertebrobasilar junction without stenosis or other abnormality. Both PICA origins patent and normal. Basilar widely patent proximally. There is some a subtle irregular filling defect involving the distal basilar artery/basilar tip, concerning for intraluminal thrombus (series 7, image 128). Secondary mild to moderate narrowing at the basilar tip. Neither superior cerebellar artery well visualized at the origin., and could be occluded. Irregular partially occlusive filling defect seen involving the left P1 segment with resultant moderate to severe stenosis (series 7, image 129). Left PCA otherwise widely patent distally. On the right, there is acute occlusion at the distal right P2 segment (series 7, image 141). Perfusion within right PCA branches distally could be related to subocclusive thrombus and/or collateralization. Venous sinuses: Dedicated venogram images were performed. Normal enhancement seen throughout the superior sagittal sinus to the level of the torcula. Transverse and sigmoid sinuses are patent bilaterally. Proximal internal jugular veins patent. Straight sinus, vein of Galen, internal cerebral veins, and basal veins of Rosenthal are patent. No visible abnormality about the cavernous sinus. Superior orbital veins grossly symmetric and within normal limits. No evidence for dural sinus thrombosis. No appreciable cortical venous thrombosis. Anatomic variants: None  significant.  No aneurysm. Review of the MIP images confirms the above findings IMPRESSION: 1. 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 2. Subtle irregular nonocclusive filling defect involving the distal basilar artery/basilar tip, consistent with associated distal embolic disease. Adjacent superior cerebellar arteries are not visualized at their origins, and could be occluded. 3. Associated partially occlusive thrombus with severe stenosis involving the left P1 segment. 4. Additional distal right P2 embolic occlusion. 5. Wide patency of both carotid artery systems and anterior circulation. 6. Negative CT venogram.  No evidence for dural sinus thrombosis. Critical Value/emergent results were called by telephone at the time of interpretation on 07/27/2021 at 1:26 am to provider [facility] , who verbally acknowledged these results. Electronically Signed   By: Rise Mu M.D.   On: 07/27/2021 02:08   DG Chest Port 1 View  Result Date: 07/27/2021 CLINICAL DATA:  Encounter for intubation EXAM: PORTABLE CHEST 1 VIEW COMPARISON:  Chest radiograph 07/26/2021 FINDINGS: Endotracheal tube overlies the midthoracic trachea. Enteric tube tip and side port overlie the stomach. There is a new left neck stent. Unchanged cardiomediastinal silhouette. No focal airspace consolidation. No large pleural effusion or visible pneumothorax. There is no acute osseous abnormality. IMPRESSION: Endotracheal tube overlies the midthoracic trachea. No new airspace disease. Electronically Signed   By: Caprice Renshaw M.D.   On: 07/27/2021 08:57   DG Chest Portable 1 View  Result Date: 07/26/2021 CLINICAL DATA:  Status post intubation. EXAM: PORTABLE CHEST 1 VIEW COMPARISON:  None. FINDINGS: Endotracheal tube with  tip approximately 2 cm above the carina. Enteric tube extends below the diaphragm with tip beyond the inferior margin  of the image. Mild cardiomegaly with mild central vascular congestion. No focal consolidation, pleural effusion or pneumothorax. No acute osseous pathology. IMPRESSION: 1. Endotracheal tube above the carina. 2. Mild cardiomegaly with mild central vascular congestion. No focal consolidation. Electronically Signed   By: Elgie Collard M.D.   On: 07/26/2021 23:06   DG Abd Portable 1V  Result Date: 07/27/2021 CLINICAL DATA:  NG tube placement EXAM: PORTABLE ABDOMEN - 1 VIEW COMPARISON:  None. FINDINGS: Endotracheal tube overlies the midthoracic trachea. Nasogastric tube side port overlies the body of the stomach, tip overlying the distal stomach. There is gas distention of bowel in the abdomen. IMPRESSION: Nasogastric tube tip and side port overlie the stomach. Gaseous distension of bowel in the abdomen. Electronically Signed   By: Caprice Renshaw M.D.   On: 07/27/2021 09:50   EEG adult  Result Date: 07/27/2021 Charlsie Quest, MD     07/27/2021  1:54 PM Patient Name: Roy [name] MRN: [id number] Epilepsy Attending: Charlsie Quest Referring Physician/Provider: Dr Delia Heady Date: 07/27/2021 Duration: 22.57 mins Patient history: 25 year old male with seizure-like episodes.  EEG to evaluate for seizure. Level of alertness: Lethargic AEDs during EEG study: Propofol Technical aspects: This EEG study was done with scalp electrodes positioned according to the 10-20 International system of electrode placement. Electrical activity was acquired at a sampling rate of 500Hz  and reviewed with a high frequency filter of 70Hz  and a low frequency filter of 1Hz . EEG data were recorded continuously and digitally stored. Description: EEG showed an excessive amount of 15 to 18 Hz beta activity  distributed symmetrically and diffusely.   Hyperventilation and photic stimulation were not performed.   ABNORMALITY - Excessive beta, generalized IMPRESSION: This study is within normal limits. No seizures or epileptiform discharges  were seen throughout the recording. The excessive beta activity seen in the background is most likely due to the effect of medications like benzodiazepine and is a benign EEG pattern.   ECHOCARDIOGRAM COMPLETE  Result Date: 07/27/2021    ECHOCARDIOGRAM REPORT   Patient Name:   KEDDRICK WYNE Date of Exam: 07/27/2021 Medical Rec #:  13/04/2021       Height:       62.0 in Accession #:    Billey Chang      Weight:       210.0 lb Date of Birth:  [birth year]       BSA:          1.952 m Patient Age:    24 years        BP:           97/71 mmHg Patient Gender: M               HR:           103 bpm. Exam Location:  Inpatient Procedure: 2D Echo, Cardiac Doppler and Color Doppler Indications:    Stroke  History:        Patient has no prior history of Echocardiogram examinations.                 Seizure and Stroke.  Sonographer:    [id number] Referring Phys: [id number] [facility] KHALIQDINA IMPRESSIONS  1. Left ventricular ejection fraction, by estimation, is 60 to 65%. The left ventricle has normal function. The left ventricle has no regional wall motion abnormalities. Left ventricular diastolic parameters were  normal.  2. Right ventricular systolic function is normal. The right ventricular size is normal.  3. The mitral valve is normal in structure. No evidence of mitral valve regurgitation. No evidence of mitral stenosis.  4. The aortic valve is normal in structure. Aortic valve regurgitation is not visualized. No aortic stenosis is present.  5. The inferior vena cava is normal in size with greater than 50% respiratory variability, suggesting right atrial pressure of 3 mmHg. Conclusion(s)/Recommendation(s): No intracardiac source of embolism detected on this transthoracic study. A transesophageal echocardiogram is recommended to exclude cardiac source of embolism if clinically indicated. FINDINGS  Left Ventricle: Left ventricular ejection fraction, by estimation, is 60 to 65%. The left ventricle has normal  function. The left ventricle has no regional wall motion abnormalities. The left ventricular internal cavity size was normal in size. There is  no left ventricular hypertrophy. Left ventricular diastolic parameters were normal. Right Ventricle: The right ventricular size is normal. No increase in right ventricular wall thickness. Right ventricular systolic function is normal. Left Atrium: Left atrial size was normal in size. Right Atrium: Right atrial size was normal in size. Pericardium: There is no evidence of pericardial effusion. Mitral Valve: The mitral valve is normal in structure. No evidence of mitral valve regurgitation. No evidence of mitral valve stenosis. Tricuspid Valve: The tricuspid valve is normal in structure. Tricuspid valve regurgitation is not demonstrated. No evidence of tricuspid stenosis. Aortic Valve: The aortic valve is normal in structure. Aortic valve regurgitation is not visualized. No aortic stenosis is present. Aortic valve mean gradient measures 4.0 mmHg. Aortic valve peak gradient measures 8.1 mmHg. Aortic valve area, by VTI measures 2.09 cm. Pulmonic Valve: The pulmonic valve was normal in structure. Pulmonic valve regurgitation is not visualized. No evidence of pulmonic stenosis. Aorta: The aortic root is normal in size and structure. Venous: The inferior vena cava is normal in size with greater than 50% respiratory variability, suggesting right atrial pressure of 3 mmHg. IAS/Shunts: No atrial level shunt detected by color flow Doppler.  LEFT VENTRICLE PLAX 2D LVIDd:         4.70 cm   Diastology LVIDs:         2.90 cm   LV e' medial: 10.60 cm/s LV PW:         1.00 cm LV IVS:        1.00 cm LVOT diam:     1.90 cm LV SV:         46 LV SV Index:   24 LVOT Area:     2.84 cm  RIGHT VENTRICLE RV S prime:     20.30 cm/s LEFT ATRIUM             Index LA diam:        2.90 cm 1.49 cm/m LA Vol (A2C):   40.5 ml 20.75 ml/m LA Vol (A4C):   37.2 ml 19.06 ml/m LA Biplane Vol: 40.9 ml 20.96  ml/m  AORTIC VALVE                    PULMONIC VALVE AV Area (Vmax):    2.04 cm     PV Vmax:       1.37 m/s AV Area (Vmean):   2.18 cm     PV Peak grad:  7.5 mmHg AV Area (VTI):     2.09 cm AV Vmax:           142.00 cm/s AV Vmean:  92.800 cm/s AV VTI:            0.220 m AV Peak Grad:      8.1 mmHg AV Mean Grad:      4.0 mmHg LVOT Vmax:         102.00 cm/s LVOT Vmean:        71.300 cm/s LVOT VTI:          0.162 m LVOT/AV VTI ratio: 0.74  AORTA Ao Root diam: 2.90 cm Ao Asc diam:  2.60 cm  SHUNTS Systemic VTI:  0.16 m Systemic Diam: 1.90 cm Rachelle Hora Croitoru MD Electronically signed by Thurmon Fair MD Signature Date/Time: 07/27/2021/10:30:31 AM    Final    CT VENOGRAM HEAD  Result Date: 07/27/2021 CLINICAL DATA:  Initial evaluation for acute altered mental status, evaluate for basilar thrombus. EXAM: CT ANGIOGRAPHY HEAD AND NECK WITH CONTRAST CT VENOGRAM HEAD WITH CONTRAST TECHNIQUE: Multidetector CT imaging of the head and neck was performed using the standard protocol during bolus administration of intravenous contrast. Multiplanar CT image reconstructions and MIPs were obtained to evaluate the vascular anatomy. Carotid stenosis measurements (when applicable) are obtained utilizing NASCET criteria, using the distal internal carotid diameter as the denominator. CONTRAST:  60mL OMNIPAQUE IOHEXOL 350 MG/ML SOLN COMPARISON:  Prior noncontrast head CT from 07/26/2021./ FINDINGS: CTA NECK FINDINGS Aortic arch: Visualized aortic arch normal in caliber with normal branch pattern. No stenosis or other abnormality about the origin of the great vessels. Right carotid system: Right common and internal carotid arteries widely patent without stenosis, dissection or occlusion. Left carotid system: Left common and internal carotid arteries widely patent without stenosis, dissection or occlusion. Vertebral arteries: Both vertebral arteries arise from the subclavian arteries. No proximal subclavian artery stenosis. Right  vertebral artery widely patent within the neck without stenosis or other abnormality. On the left, there is intimal irregularity with intraluminal filling defect involving the proximal left V1 segment, concerning for acute dissection (series 7, image 130). Area of involvement begins at or just beyond the origin, and extends approximately 2.3 cm in length. Associated moderate to severe stenosis proximally (series 5, image 271). Distally, the left vertebral artery is otherwise widely patent to the skull base. Skeleton: No visible acute osseous finding. No discrete or worrisome osseous lesions. Other neck: No other acute soft tissue abnormality within the neck. Endotracheal and enteric tubes in place. Upper chest: Mild atelectatic changes noted dependently within the visualized lungs. Visualized upper chest demonstrates no other acute finding. Review of the MIP images confirms the above findings CTA HEAD FINDINGS Anterior circulation: Both internal carotid arteries widely patent to the termini without stenosis. A1 segments widely patent. Normal anterior communicating artery complex. Both anterior cerebral arteries widely patent to their distal aspects without stenosis. No M1 stenosis or occlusion. Normal MCA bifurcations. Distal MCA branches well perfused and symmetric. Posterior circulation: Both V4 segments patent to the vertebrobasilar junction without stenosis or other abnormality. Both PICA origins patent and normal. Basilar widely patent proximally. There is some a subtle irregular filling defect involving the distal basilar artery/basilar tip, concerning for intraluminal thrombus (series 7, image 128). Secondary mild to moderate narrowing at the basilar tip. Neither superior cerebellar artery well visualized at the origin., and could be occluded. Irregular partially occlusive filling defect seen involving the left P1 segment with resultant moderate to severe stenosis (series 7, image 129). Left PCA otherwise  widely patent distally. On the right, there is acute occlusion at the distal right P2 segment (series 7, image  141). Perfusion within right PCA branches distally could be related to subocclusive thrombus and/or collateralization. Venous sinuses: Dedicated venogram images were performed. Normal enhancement seen throughout the superior sagittal sinus to the level of the torcula. Transverse and sigmoid sinuses are patent bilaterally. Proximal internal jugular veins patent. Straight sinus, vein of Galen, internal cerebral veins, and basal veins of Rosenthal are patent. No visible abnormality about the cavernous sinus. Superior orbital veins grossly symmetric and within normal limits. No evidence for dural sinus thrombosis. No appreciable cortical venous thrombosis. Anatomic variants: None significant.  No aneurysm. Review of the MIP images confirms the above findings IMPRESSION: 1. 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 2. Subtle irregular nonocclusive filling defect involving the distal basilar artery/basilar tip, consistent with associated distal embolic disease. Adjacent superior cerebellar arteries are not visualized at their origins, and could be occluded. 3. Associated partially occlusive thrombus with severe stenosis involving the left P1 segment. 4. Additional distal right P2 embolic occlusion. 5. Wide patency of both carotid artery systems and anterior circulation. 6. Negative CT venogram.  No evidence for dural sinus thrombosis. Critical Value/emergent results were called by telephone at the time of interpretation on 07/27/2021 at 1:26 am to provider [facility] , who verbally acknowledged these results. Electronically Signed   By: Rise Mu M.D.   On: 07/27/2021 02:08    PHYSICAL EXAM [name] who is sedated and intubated.  His intermittent tremors versus  myoclonic jerks which appear to be stimulus sensitive. . Afebrile. Head is nontraumatic. Neck is supple without bruit.    Cardiac exam no murmur or gallop. Lungs are clear to auscultation. Distal pulses are well felt. . Neurological Exam : Patient is sedated and intubated  Mental status/Cognition: intubated, no response to voice or loud clap. No response to noxious stimuli. Speech/language: intubated Cranial nerves/brainstem: Pupils 3mm, equal round and reactive to light Corneals: weakly positive BL Cough: Present Gag: Weak.   Motor:  Muscle bulk: normal, intermittent arhythmic twitching of tongue, LUE and RLE about once every couple secs and the movements in LUE and RLE appear out of synch.   Does not withdraw to noxious stimuli in any extremities.  Sensation: No response to pinch in any extremities   Coordination/Complex Motor:  Unable to assess.  ASSESSMENT/PLAN [name] is a 25 y.o. male with  no significant PMH who presents with shaking and unresponsiveness. Patient is intubated and unable to provide any history. Most of the history obtained from sister and fiance over phone. Per fiance, he has been reporting that his head hurts for the last 3-4 days along with feeling cold. Today, they smoked marijuana and he took 4 shots of Vodka. They were having intercourse when he fell out and the left side of his body went numb. He was sweating profusely and kept saying "my brain" and then started having seizures. This happened about 30 mins to an hours after taking marijuana. No prior hx of seizures. He has been reporting headache for the last 3-4 days. Today started throwing up. He threw up a lot today. Spends a lot of time in a shed but comes to sister's home every night to sleep. The shed does not have proper ventilation, it is full of stuff but nothing toxic that sister or fiance is aware of. No obvious exposure to Carbon Monoxide. No fever, no chills but has been feeling cold.    The fiance immediately  called EMS for concern for seizure like activity. Per notes, the seizure like activity stopped enroute by the time EMS got an IV so he was not given any Versed. It resumed in the ED and he was given ativan 4mg , Keppra 2G in the ED with persistent shaking and was intubated with rocuronium and etomidate and started on propofol.   On my evaluation, he has tongue movements and twitching along with jerks mostly in the LUE and RLE. Jerking appears arhythmic on my evaluation and ?myoclonic.   No recent water fasts, he is not a regular drinker, has used marijuana in the past without any issues. Fiance and another friend also smoked the same marijuana without any issues. He was fine all day otherwise. No recent head injury with loss of consciousness, no family history of seizures.  CT head was negative for a large hypodensity concerning for a large territory infarct or hyperdensity concerning for an ICH.  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 In addition, imaging notable for a distal basilar thrombus, R PCA P2 occlusion and Left PCA P1 occlusion. A code stroke was initiated and case discussed with Dr. Corliss Skains for evaluation for potential thrombectomy. Dr. Corliss Skains discussed risks and benefits and the need for thrombectomy with patient's sister Fabio Asa over the phone with the fiance listening in over the speaker. The sister consented for the surgery and he was taken to IR for a stent assisted angioplasty of proximal Left VA.   MRI brain is pending.   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   Intermittent involuntary movements which appear to be stimulus sensitive myoclonus versus tremors of unclear etiology.  Doubt seizures.  Code  Stroke  CT head No acute abnormality.  Small vessel disease. Atrophy.  ASPECTS 10.     CTA head & neck   1. 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 2. Subtle irregular nonocclusive filling defect involving the distal basilar artery/basilar tip, consistent with associated distal embolic disease. Adjacent superior cerebellar arteries are not visualized at their origins, and could be occluded. 3. Associated partially occlusive thrombus with severe stenosis involving the left P1 segment. 4. Additional distal right P2 embolic occlusion. 5. Wide patency of both carotid artery systems and anterior circulation. 6. Negative CT venogram.  No evidence for dural sinus thrombosis.  Cerebral angio  S/P bilateral vert  artery and RT common carotid arteriograms . RT CFA approach. S/P revascularization of occuded distal basilar artery and both PCAs with x1 pass with 40 mm solitaireX retriever and contact  Aspiration achieving a TICI 2C revascularization.  Mechanical thrombectomy stent assisted angioplasty for symptomatic prox LT VA  Dissection with distal protection.   Post IR CT  No acute intracranial pathology. No evidence of intracranial hemorrhage. EEG.  Excessive beta activity.  No seizures MRI  pending 2D Echo EF 60-65%, normal LA, no IA shunt   LDL No results found for requested labs within last 16109 hours. HgbA1c 5.7 VTE prophylaxis - scd EEG: within normal limits. No seizures or epileptiform activity noted.     Diet   Diet NPO time specified   No antithrombotic prior to admission, now on aspirin 81 mg daily and Brilinta (ticagrelor) 90 mg bid.   Therapy recommendations:  pending Disposition:  pending   Hypertension Home meds:   none Unstable Permissive hypertension (OK if < 220/120) but gradually normalize in 5-7 days Long-term BP goal normotensive  Hyperlipidemia Home  meds:  none LDL No results found for requested labs within last 18841 hours., goal < 70 Add lipitor    High intensity statin   Continue statin at discharge  HgbA1c 5.7, goal < 7.0 CBGs Recent Labs    07/26/21 2257 07/27/21 0744 07/27/21 1136  GLUCAP 128* 87 97    SSI  Other Stroke Risk Factors   Cigarette smoker     Substance abuse - UDS:  THC POSITIVE, Cocaine NONE DETECTED. Patient advised to stop using due to stroke risk.  Obesity, Body mass index is 38.41 kg/m., BMI >/= 30 associated with increased stroke risk, recommend weight loss, diet and exercise as appropriate   Hx stroke/TIA    Other Active Problems Respiratory failure   Hospital day # 1 I have personally obtained history,examined this patient, reviewed notes, independently viewed imaging studies, participated in medical decision making and plan of care.ROS completed by me personally and pertinent positives fully documented  I have made any additions or clarifications directly to the above note. Agree with note above.  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  He underwent emergent mechanical thrombectomy with residual proximal left vertebral artery stenting.  Recommend continue ventilatory support for respiratory failure as per critical care medicine.  Check MRI scan of the brain later today.  Try weaning off ventilatory support as tolerated.  Maintain strict blood pressure control and close neurological monitoring as per post intervention protocol.  Check long-term EEG monitoring for any occult seizures.  Aspirin and Brilinta for stent.  Discussed with his girlfriend over the phone and answered questions.  Discussed with Dr. Corliss Skains and Dr. Merrily Pew critical care medicine. This patient is critically ill and at significant risk of neurological  worsening, death and care requires constant monitoring of vital signs, hemodynamics,respiratory and cardiac monitoring, extensive review of multiple databases, frequent neurological assessment, discussion with family, other specialists and medical decision making of high complexity.I have made any additions or clarifications directly to the above note.This critical care time does not reflect procedure time, or teaching time or supervisory time of PA/NP/Med Resident etc but could involve care discussion time.  I spent 50 minutes of neurocritical care time  in the care of  this patient.     Delia Heady, MD Medical Director [facility] Pager: [phone] 07/27/2021 5:24 PM     To contact Stroke Continuity provider, please refer to [email or web address]. After hours, contact General Neurology

## 2021-07-27 NOTE — Anesthesia Postprocedure Evaluation (Signed)
Anesthesia Post Note  Patient: Roy [name]  Procedure(s) Performed: IR WITH ANESTHESIA     Anesthesia Post Evaluation No notable events documented.  Last Vitals:  Vitals:   07/27/21 0148 07/27/21 0540  BP: (!) 112/43   Pulse: (!) 110   Resp: (!) 22   Temp:    SpO2: 99% 100%    Last Pain:  Vitals:   07/27/21 0115  TempSrc: Axillary                 [name]

## 2021-07-27 NOTE — Progress Notes (Signed)
LTM EEG hooked up and running - no initial skin breakdown - push button tested - neuro notified. [facility] monitoring.  

## 2021-07-27 NOTE — Progress Notes (Signed)
eLink Physician-Brief Progress Note Patient Name: Roy [name] DOB: 1995/09/26 MRN: [id number]   Date of Service  07/27/2021  HPI/Events of Note  Patient admitted with altered mental status, possible seizures, acute respiratory failure on the ventilator, in the context of Marijuana and ETOH use, as well as sexual activity. Work up is in progress, including cEEG. Neurology is following patient.  eICU Interventions  New Patient Evaluation.        Roy [name] 07/27/2021, 1:33 AM

## 2021-07-27 NOTE — Progress Notes (Signed)
MRI compatible EEG leads used for overnight EEG hookup.

## 2021-07-27 NOTE — Progress Notes (Signed)
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 Appears comfortable on the ventilator. Right groin site is soft, clean and dry.   Allergies: Patient has no known allergies.  Medications: Prior to Admission medications   Not on File     Vital Signs: BP 113/70   Pulse 97   Temp 99.1 F (37.3 C) (Axillary)   Resp 20   Ht  (1.575 m)   Wt 210 lb (95.3 kg)   SpO2 100%   BMI 38.41 kg/m   Physical Exam Constitutional:      Comments: Intubated/sedated  Cardiovascular:     Rate and Rhythm: Normal rate and regular rhythm.     Comments: Right femoral vascular access site is soft, clean and dry.  Skin:    General: Skin is warm and dry.  Neurological:     Comments: Pupils equal, round, reactive    Imaging: CT ANGIO HEAD W OR WO CONTRAST  Result Date: 07/27/2021 CLINICAL DATA:  Initial evaluation for acute altered mental status, evaluate for basilar thrombus. EXAM: CT ANGIOGRAPHY HEAD AND NECK WITH CONTRAST CT VENOGRAM HEAD WITH CONTRAST TECHNIQUE: Multidetector CT imaging of the head and neck was performed using the standard protocol during bolus administration of intravenous contrast. Multiplanar CT image reconstructions and MIPs were obtained to evaluate the vascular anatomy. Carotid stenosis measurements (when applicable) are obtained utilizing NASCET criteria, using the distal internal carotid diameter as the denominator. CONTRAST:  60mL OMNIPAQUE IOHEXOL 350 MG/ML SOLN COMPARISON:  Prior noncontrast head CT from 07/26/2021./ FINDINGS: CTA NECK FINDINGS Aortic arch: Visualized aortic arch normal  in caliber with normal branch pattern. No stenosis or other abnormality about the origin of the great vessels. Right carotid system: Right common and internal carotid arteries widely patent without stenosis, dissection or occlusion. Left carotid system: Left common and internal carotid arteries widely patent without stenosis, dissection or occlusion. Vertebral arteries: Both vertebral arteries arise from the subclavian arteries. No proximal subclavian artery stenosis. Right vertebral artery widely patent within the neck without stenosis or other abnormality. On the left, there is intimal irregularity with intraluminal filling defect involving the proximal left V1 segment, concerning for acute dissection (series 7, image 130). Area of involvement begins at or just beyond the origin, and extends approximately 2.3 cm in length. Associated moderate to severe stenosis proximally (series 5, image 271). Distally, the left vertebral artery is otherwise widely patent to the skull base. Skeleton: No visible acute osseous finding. No discrete or worrisome osseous lesions. Other neck: No other acute soft tissue abnormality within the neck. Endotracheal and enteric tubes in place. Upper chest: Mild atelectatic changes noted dependently within the visualized lungs. Visualized upper chest demonstrates no other acute finding. Review of the MIP images confirms the above findings CTA HEAD FINDINGS Anterior circulation: Both internal carotid arteries widely patent to the termini without stenosis. A1 segments widely patent. Normal anterior communicating artery complex. Both anterior cerebral arteries widely patent to their distal aspects without stenosis. No M1 stenosis or occlusion. Normal MCA bifurcations. Distal MCA branches well perfused and symmetric. Posterior circulation: Both V4 segments patent to the vertebrobasilar junction without stenosis or other abnormality. Both PICA origins patent and normal. Basilar  widely patent  proximally. There is some a subtle irregular filling defect involving the distal basilar artery/basilar tip, concerning for intraluminal thrombus (series 7, image 128). Secondary mild to moderate narrowing at the basilar tip. Neither superior cerebellar artery well visualized at the origin., and could be occluded. Irregular partially occlusive filling defect seen involving the left P1 segment with resultant moderate to severe stenosis (series 7, image 129). Left PCA otherwise widely patent distally. On the right, there is acute occlusion at the distal right P2 segment (series 7, image 141). Perfusion within right PCA branches distally could be related to subocclusive thrombus and/or collateralization. Venous sinuses: Dedicated venogram images were performed. Normal enhancement seen throughout the superior sagittal sinus to the level of the torcula. Transverse and sigmoid sinuses are patent bilaterally. Proximal internal jugular veins patent. Straight sinus, vein of Galen, internal cerebral veins, and basal veins of Rosenthal are patent. No visible abnormality about the cavernous sinus. Superior orbital veins grossly symmetric and within normal limits. No evidence for dural sinus thrombosis. No appreciable cortical venous thrombosis. Anatomic variants: None significant.  No aneurysm. Review of the MIP images confirms the above findings IMPRESSION: 1. 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 2. Subtle irregular nonocclusive filling defect involving the distal basilar artery/basilar tip, consistent with associated distal embolic disease. Adjacent superior cerebellar arteries are not visualized at their origins, and could be occluded. 3. Associated partially occlusive thrombus with severe stenosis involving the left P1 segment. 4. Additional distal right P2 embolic occlusion. 5. Wide patency of both carotid artery systems  and anterior circulation. 6. Negative CT venogram.  No evidence for dural sinus thrombosis. Critical Value/emergent results were called by telephone at the time of interpretation on 07/27/2021 at 1:26 am to provider [facility] , who verbally acknowledged these results. Electronically Signed   By: Rise Mu M.D.   On: 07/27/2021 02:08   CT HEAD WO CONTRAST ( )  Result Date: 07/27/2021 CLINICAL DATA:  Follow-up suspected intracranial hemorrhage EXAM: CT HEAD WITHOUT CONTRAST TECHNIQUE: Contiguous axial images were obtained from the base of the skull through the vertex without intravenous contrast. COMPARISON:  None. FINDINGS: Brain: No evidence of acute infarction, hemorrhage, hydrocephalus, extra-axial collection or mass lesion/mass effect. Vascular: No hyperdense vessel or unexpected calcification. Skull: Normal. Negative for fracture or focal lesion. Sinuses/Orbits: No acute finding. Other: None. IMPRESSION: No acute intracranial pathology. No evidence of intracranial hemorrhage. Electronically Signed   By: Jearld Lesch M.D.   On: 07/27/2021 09:03   CT Head Wo Contrast  Result Date: 07/26/2021 CLINICAL DATA:  Seizure. EXAM: CT HEAD WITHOUT CONTRAST TECHNIQUE: Contiguous axial images were obtained from the base of the skull through the vertex without intravenous contrast. COMPARISON:  None. FINDINGS: Brain: No evidence of acute infarction, hemorrhage, hydrocephalus, extra-axial collection or mass lesion/mass effect. Vascular: No hyperdense vessel or unexpected calcification. Skull: Normal. Negative for fracture or focal lesion. Sinuses/Orbits: There is mucoperiosteal thickening of paranasal sinuses and complete opacification of the nasal passages. No air-fluid. The mastoid air cells are clear. Other: None IMPRESSION: 1. Normal noncontrast CT of the brain. 2. Paranasal sinus disease. Electronically Signed   By: Elgie Collard M.D.   On: 07/26/2021 23:51   CT ANGIO NECK W OR WO  CONTRAST  Result Date: 07/27/2021 CLINICAL DATA:  Initial evaluation for acute altered mental status, evaluate for basilar thrombus. EXAM: CT ANGIOGRAPHY HEAD AND NECK WITH CONTRAST CT VENOGRAM HEAD WITH CONTRAST TECHNIQUE:  Multidetector CT imaging of the head and neck was performed using the standard protocol during bolus administration of intravenous contrast. Multiplanar CT image reconstructions and MIPs were obtained to evaluate the vascular anatomy. Carotid stenosis measurements (when applicable) are obtained utilizing NASCET criteria, using the distal internal carotid diameter as the denominator. CONTRAST:  60mL OMNIPAQUE IOHEXOL 350 MG/ML SOLN COMPARISON:  Prior noncontrast head CT from 07/26/2021./ FINDINGS: CTA NECK FINDINGS Aortic arch: Visualized aortic arch normal in caliber with normal branch pattern. No stenosis or other abnormality about the origin of the great vessels. Right carotid system: Right common and internal carotid arteries widely patent without stenosis, dissection or occlusion. Left carotid system: Left common and internal carotid arteries widely patent without stenosis, dissection or occlusion. Vertebral arteries: Both vertebral arteries arise from the subclavian arteries. No proximal subclavian artery stenosis. Right vertebral artery widely patent within the neck without stenosis or other abnormality. On the left, there is intimal irregularity with intraluminal filling defect involving the proximal left V1 segment, concerning for acute dissection (series 7, image 130). Area of involvement begins at or just beyond the origin, and extends approximately 2.3 cm in length. Associated moderate to severe stenosis proximally (series 5, image 271). Distally, the left vertebral artery is otherwise widely patent to the skull base. Skeleton: No visible acute osseous finding. No discrete or worrisome osseous lesions. Other neck: No other acute soft tissue abnormality within the neck. Endotracheal  and enteric tubes in place. Upper chest: Mild atelectatic changes noted dependently within the visualized lungs. Visualized upper chest demonstrates no other acute finding. Review of the MIP images confirms the above findings CTA HEAD FINDINGS Anterior circulation: Both internal carotid arteries widely patent to the termini without stenosis. A1 segments widely patent. Normal anterior communicating artery complex. Both anterior cerebral arteries widely patent to their distal aspects without stenosis. No M1 stenosis or occlusion. Normal MCA bifurcations. Distal MCA branches well perfused and symmetric. Posterior circulation: Both V4 segments patent to the vertebrobasilar junction without stenosis or other abnormality. Both PICA origins patent and normal. Basilar widely patent proximally. There is some a subtle irregular filling defect involving the distal basilar artery/basilar tip, concerning for intraluminal thrombus (series 7, image 128). Secondary mild to moderate narrowing at the basilar tip. Neither superior cerebellar artery well visualized at the origin., and could be occluded. Irregular partially occlusive filling defect seen involving the left P1 segment with resultant moderate to severe stenosis (series 7, image 129). Left PCA otherwise widely patent distally. On the right, there is acute occlusion at the distal right P2 segment (series 7, image 141). Perfusion within right PCA branches distally could be related to subocclusive thrombus and/or collateralization. Venous sinuses: Dedicated venogram images were performed. Normal enhancement seen throughout the superior sagittal sinus to the level of the torcula. Transverse and sigmoid sinuses are patent bilaterally. Proximal internal jugular veins patent. Straight sinus, vein of Galen, internal cerebral veins, and basal veins of Rosenthal are patent. No visible abnormality about the cavernous sinus. Superior orbital veins grossly symmetric and within normal  limits. No evidence for dural sinus thrombosis. No appreciable cortical venous thrombosis. Anatomic variants: None significant.  No aneurysm. Review of the MIP images confirms the above findings IMPRESSION: 1. 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 2. Subtle irregular nonocclusive filling defect involving the distal basilar artery/basilar tip, consistent with associated distal embolic disease. Adjacent superior cerebellar arteries are not visualized at their origins, and could  be occluded. 3. Associated partially occlusive thrombus with severe stenosis involving the left P1 segment. 4. Additional distal right P2 embolic occlusion. 5. Wide patency of both carotid artery systems and anterior circulation. 6. Negative CT venogram.  No evidence for dural sinus thrombosis. Critical Value/emergent results were called by telephone at the time of interpretation on 07/27/2021 at 1:26 am to provider [facility] , who verbally acknowledged these results. Electronically Signed   By: Rise Mu M.D.   On: 07/27/2021 02:08   DG Chest Port 1 View  Result Date: 07/27/2021 CLINICAL DATA:  Encounter for intubation EXAM: PORTABLE CHEST 1 VIEW COMPARISON:  Chest radiograph 07/26/2021 FINDINGS: Endotracheal tube overlies the midthoracic trachea. Enteric tube tip and side port overlie the stomach. There is a new left neck stent. Unchanged cardiomediastinal silhouette. No focal airspace consolidation. No large pleural effusion or visible pneumothorax. There is no acute osseous abnormality. IMPRESSION: Endotracheal tube overlies the midthoracic trachea. No new airspace disease. Electronically Signed   By: Caprice Renshaw M.D.   On: 07/27/2021 08:57   DG Chest Portable 1 View  Result Date: 07/26/2021 CLINICAL DATA:  Status post intubation. EXAM: PORTABLE CHEST 1 VIEW COMPARISON:  None. FINDINGS: Endotracheal tube with tip  approximately 2 cm above the carina. Enteric tube extends below the diaphragm with tip beyond the inferior margin of the image. Mild cardiomegaly with mild central vascular congestion. No focal consolidation, pleural effusion or pneumothorax. No acute osseous pathology. IMPRESSION: 1. Endotracheal tube above the carina. 2. Mild cardiomegaly with mild central vascular congestion. No focal consolidation. Electronically Signed   By: Elgie Collard M.D.   On: 07/26/2021 23:06   DG Abd Portable 1V  Result Date: 07/27/2021 CLINICAL DATA:  NG tube placement EXAM: PORTABLE ABDOMEN - 1 VIEW COMPARISON:  None. FINDINGS: Endotracheal tube overlies the midthoracic trachea. Nasogastric tube side port overlies the body of the stomach, tip overlying the distal stomach. There is gas distention of bowel in the abdomen. IMPRESSION: Nasogastric tube tip and side port overlie the stomach. Gaseous distension of bowel in the abdomen. Electronically Signed   By: Caprice Renshaw M.D.   On: 07/27/2021 09:50   EEG adult  Result Date: 07/27/2021 Charlsie Quest, MD     07/27/2021  1:54 PM Patient Name: Roy [name] MRN: [id number] Epilepsy Attending: Charlsie Quest Referring Physician/Provider: Dr Delia Heady Date: 07/27/2021 Duration: 22.57 mins Patient history: 25 year old male with seizure-like episodes.  EEG to evaluate for seizure. Level of alertness: Lethargic AEDs during EEG study: Propofol Technical aspects: This EEG study was done with scalp electrodes positioned according to the 10-20 International system of electrode placement. Electrical activity was acquired at a sampling rate of 500Hz  and reviewed with a high frequency filter of 70Hz  and a low frequency filter of 1Hz . EEG data were recorded continuously and digitally stored. Description: EEG showed an excessive amount of 15 to 18 Hz beta activity  distributed symmetrically and diffusely.   Hyperventilation and photic stimulation were not performed.   ABNORMALITY -  Excessive beta, generalized IMPRESSION: This study is within normal limits. No seizures or epileptiform discharges were seen throughout the recording. The excessive beta activity seen in the background is most likely due to the effect of medications like benzodiazepine and is a benign EEG pattern.   ECHOCARDIOGRAM COMPLETE  Result Date: 07/27/2021    ECHOCARDIOGRAM REPORT   Patient Name:   Roy [name] Date of Exam: 07/27/2021 Medical Rec #:  [id number]  Height:       62.0 in Accession #:    [id number]      Weight:       210.0 lb Date of Birth:  August 31, 1996       BSA:          1.952 m Patient Age:    24 years        BP:           97/71 mmHg Patient Gender: M               HR:           103 bpm. Exam Location:  Inpatient Procedure: 2D Echo, Cardiac Doppler and Color Doppler Indications:    Stroke  History:        Patient has no prior history of Echocardiogram examinations.                 Seizure and Stroke.  Sonographer:    Vanetta Shawl Referring Phys: [id number] [facility] KHALIQDINA IMPRESSIONS  1. Left ventricular ejection fraction, by estimation, is 60 to 65%. The left ventricle has normal function. The left ventricle has no regional wall motion abnormalities. Left ventricular diastolic parameters were normal.  2. Right ventricular systolic function is normal. The right ventricular size is normal.  3. The mitral valve is normal in structure. No evidence of mitral valve regurgitation. No evidence of mitral stenosis.  4. The aortic valve is normal in structure. Aortic valve regurgitation is not visualized. No aortic stenosis is present.  5. The inferior vena cava is normal in size with greater than 50% respiratory variability, suggesting right atrial pressure of 3 mmHg. Conclusion(s)/Recommendation(s): No intracardiac source of embolism detected on this transthoracic study. A transesophageal echocardiogram is recommended to exclude cardiac source of embolism if clinically indicated. FINDINGS   Left Ventricle: Left ventricular ejection fraction, by estimation, is 60 to 65%. The left ventricle has normal function. The left ventricle has no regional wall motion abnormalities. The left ventricular internal cavity size was normal in size. There is  no left ventricular hypertrophy. Left ventricular diastolic parameters were normal. Right Ventricle: The right ventricular size is normal. No increase in right ventricular wall thickness. Right ventricular systolic function is normal. Left Atrium: Left atrial size was normal in size. Right Atrium: Right atrial size was normal in size. Pericardium: There is no evidence of pericardial effusion. Mitral Valve: The mitral valve is normal in structure. No evidence of mitral valve regurgitation. No evidence of mitral valve stenosis. Tricuspid Valve: The tricuspid valve is normal in structure. Tricuspid valve regurgitation is not demonstrated. No evidence of tricuspid stenosis. Aortic Valve: The aortic valve is normal in structure. Aortic valve regurgitation is not visualized. No aortic stenosis is present. Aortic valve mean gradient measures 4.0 mmHg. Aortic valve peak gradient measures 8.1 mmHg. Aortic valve area, by VTI measures 2.09 cm. Pulmonic Valve: The pulmonic valve was normal in structure. Pulmonic valve regurgitation is not visualized. No evidence of pulmonic stenosis. Aorta: The aortic root is normal in size and structure. Venous: The inferior vena cava is normal in size with greater than 50% respiratory variability, suggesting right atrial pressure of 3 mmHg. IAS/Shunts: No atrial level shunt detected by color flow Doppler.  LEFT VENTRICLE PLAX 2D LVIDd:         4.70 cm   Diastology LVIDs:         2.90 cm   LV e' medial: 10.60 cm/s LV PW:  1.00 cm LV IVS:        1.00 cm LVOT diam:     1.90 cm LV SV:         46 LV SV Index:   24 LVOT Area:     2.84 cm  RIGHT VENTRICLE RV S prime:     20.30 cm/s LEFT ATRIUM             Index LA diam:        2.90 cm 1.49  cm/m LA Vol (A2C):   40.5 ml 20.75 ml/m LA Vol (A4C):   37.2 ml 19.06 ml/m LA Biplane Vol: 40.9 ml 20.96 ml/m  AORTIC VALVE                    PULMONIC VALVE AV Area (Vmax):    2.04 cm     PV Vmax:       1.37 m/s AV Area (Vmean):   2.18 cm     PV Peak grad:  7.5 mmHg AV Area (VTI):     2.09 cm AV Vmax:           142.00 cm/s AV Vmean:          92.800 cm/s AV VTI:            0.220 m AV Peak Grad:      8.1 mmHg AV Mean Grad:      4.0 mmHg LVOT Vmax:         102.00 cm/s LVOT Vmean:        71.300 cm/s LVOT VTI:          0.162 m LVOT/AV VTI ratio: 0.74  AORTA Ao Root diam: 2.90 cm Ao Asc diam:  2.60 cm  SHUNTS Systemic VTI:  0.16 m Systemic Diam: 1.90 cm Rachelle Hora Croitoru MD Electronically signed by Thurmon Fair MD Signature Date/Time: 07/27/2021/10:30:31 AM    Final    CT VENOGRAM HEAD  Result Date: 07/27/2021 CLINICAL DATA:  Initial evaluation for acute altered mental status, evaluate for basilar thrombus. EXAM: CT ANGIOGRAPHY HEAD AND NECK WITH CONTRAST CT VENOGRAM HEAD WITH CONTRAST TECHNIQUE: Multidetector CT imaging of the head and neck was performed using the standard protocol during bolus administration of intravenous contrast. Multiplanar CT image reconstructions and MIPs were obtained to evaluate the vascular anatomy. Carotid stenosis measurements (when applicable) are obtained utilizing NASCET criteria, using the distal internal carotid diameter as the denominator. CONTRAST:  33mL OMNIPAQUE IOHEXOL 350 MG/ML SOLN COMPARISON:  Prior noncontrast head CT from 07/26/2021./ FINDINGS: CTA NECK FINDINGS Aortic arch: Visualized aortic arch normal in caliber with normal branch pattern. No stenosis or other abnormality about the origin of the great vessels. Right carotid system: Right common and internal carotid arteries widely patent without stenosis, dissection or occlusion. Left carotid system: Left common and internal carotid arteries widely patent without stenosis, dissection or occlusion. Vertebral  arteries: Both vertebral arteries arise from the subclavian arteries. No proximal subclavian artery stenosis. Right vertebral artery widely patent within the neck without stenosis or other abnormality. On the left, there is intimal irregularity with intraluminal filling defect involving the proximal left V1 segment, concerning for acute dissection (series 7, image 130). Area of involvement begins at or just beyond the origin, and extends approximately 2.3 cm in length. Associated moderate to severe stenosis proximally (series 5, image 271). Distally, the left vertebral artery is otherwise widely patent to the skull base. Skeleton: No visible acute osseous finding. No discrete or worrisome osseous lesions. Other  neck: No other acute soft tissue abnormality within the neck. Endotracheal and enteric tubes in place. Upper chest: Mild atelectatic changes noted dependently within the visualized lungs. Visualized upper chest demonstrates no other acute finding. Review of the MIP images confirms the above findings CTA HEAD FINDINGS Anterior circulation: Both internal carotid arteries widely patent to the termini without stenosis. A1 segments widely patent. Normal anterior communicating artery complex. Both anterior cerebral arteries widely patent to their distal aspects without stenosis. No M1 stenosis or occlusion. Normal MCA bifurcations. Distal MCA branches well perfused and symmetric. Posterior circulation: Both V4 segments patent to the vertebrobasilar junction without stenosis or other abnormality. Both PICA origins patent and normal. Basilar widely patent proximally. There is some a subtle irregular filling defect involving the distal basilar artery/basilar tip, concerning for intraluminal thrombus (series 7, image 128). Secondary mild to moderate narrowing at the basilar tip. Neither superior cerebellar artery well visualized at the origin., and could be occluded. Irregular partially occlusive filling defect seen  involving the left P1 segment with resultant moderate to severe stenosis (series 7, image 129). Left PCA otherwise widely patent distally. On the right, there is acute occlusion at the distal right P2 segment (series 7, image 141). Perfusion within right PCA branches distally could be related to subocclusive thrombus and/or collateralization. Venous sinuses: Dedicated venogram images were performed. Normal enhancement seen throughout the superior sagittal sinus to the level of the torcula. Transverse and sigmoid sinuses are patent bilaterally. Proximal internal jugular veins patent. Straight sinus, vein of Galen, internal cerebral veins, and basal veins of Rosenthal are patent. No visible abnormality about the cavernous sinus. Superior orbital veins grossly symmetric and within normal limits. No evidence for dural sinus thrombosis. No appreciable cortical venous thrombosis. Anatomic variants: None significant.  No aneurysm. Review of the MIP images confirms the above findings IMPRESSION: 1. 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 2. Subtle irregular nonocclusive filling defect involving the distal basilar artery/basilar tip, consistent with associated distal embolic disease. Adjacent superior cerebellar arteries are not visualized at their origins, and could be occluded. 3. Associated partially occlusive thrombus with severe stenosis involving the left P1 segment. 4. Additional distal right P2 embolic occlusion. 5. Wide patency of both carotid artery systems and anterior circulation. 6. Negative CT venogram.  No evidence for dural sinus thrombosis. Critical Value/emergent results were called by telephone at the time of interpretation on 07/27/2021 at 1:26 am to provider [facility] , who verbally acknowledged these results. Electronically Signed   By: Rise Mu M.D.   On: 07/27/2021 02:08     Labs:  CBC: Recent Labs    07/27/21 0031 07/27/21 0046 07/27/21 0500 07/27/21 1223  WBC 19.0*  --  13.9*  --   HGB 13.2 13.6 11.9* 11.9*  HCT 39.9 40.0 34.6* 35.0*  PLT 179  --  319  --     COAGS: No results for input(s): INR, APTT in the last 8760 hours.  BMP: Recent Labs    07/27/21 0031 07/27/21 0046 07/27/21 0500 07/27/21 1223  NA 139 140 138 140  K 3.0* 2.9* 3.8 3.7  CL 107 110 109  --   CO2 18*  --  20*  --   GLUCOSE 112* 115* 103*  --   BUN 11 11 9   --   CALCIUM 8.5*  --  8.1*  --   CREATININE 1.13 1.00 1.00  --   GFRNONAA >60  --  >  60  --     LIVER FUNCTION TESTS: Recent Labs    07/27/21 0031  BILITOT 0.6  AST 38  ALT 38  ALKPHOS 59  PROT 6.7  ALBUMIN 3.6    Assessment and Plan:  Code Stroke s/p bilateral vertebral artery and right common carotid arteriograms via right CFA approach with revascularization of occluded distal basilar artery and both PCAs; 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 Brilinta administered this morning. Per nursing notes an attempt was made to wean sedation for a wake up assessment but the patient became agitated with hypertension and max dose cleviprex was required. Sedation restarted. Patient will go for MRI when stable.   Continue Brilinta and aspirin; other plans per neurology/primary teams. NIR will continue to follow.   Electronically Signed: Alwyn Ren, AGACNP-BC [phone] 07/27/2021, 2:20 PM   I spent a total of 15 Minutes at the the patient's bedside AND on the patient's hospital floor or unit, greater than 50% of which was counseling/coordinating care for cerebral angiogram with interventions.

## 2021-07-27 NOTE — Transfer of Care (Addendum)
Immediate Anesthesia Transfer of Care Note  Patient: Roy [name]  Procedure(s) Performed: IR WITH ANESTHESIA  Patient Location: ICU - 4N Anesthesia Type:General  Level of Consciousness: Patient remains intubated per anesthesia plan  Airway & Oxygen Therapy: Patient placed on Ventilator (see vital sign flow sheet for setting)  Post-op Assessment: Report given to RN and Post -op Vital signs reviewed and stable  Post vital signs: Reviewed and stable  Last Vitals:  Vitals Value Taken Time  BP    Temp    Pulse 98 07/27/21 0543  Resp 19 07/27/21 0543  SpO2 100 % 07/27/21 0543  Vitals shown include unvalidated device data.  Last Pain:  Vitals:   07/27/21 0115  TempSrc: Axillary         Complications: No notable events documented.

## 2021-07-27 NOTE — Progress Notes (Signed)
Tech started setup however have orders for pt to go to IR Stat. Will try back later

## 2021-07-27 NOTE — Progress Notes (Signed)
PT Cancellation Note  Patient Details Name: Roy [name] MRN: [id number] DOB: 1996-02-12   Cancelled Treatment:    Reason Eval/Treat Not Completed: Patient not medically ready (intubated and sedated). Per RN, pt not appropriate for therapies today. Will check back tomorrow.    Lyanne Co, PT  Acute Rehab Services  Pager [phone] Office [phone]    Lawana Chambers Elexius Minar 07/27/2021, 11:28 AM

## 2021-07-27 NOTE — Procedures (Signed)
Patient Name: Roy [name]  MRN: [id number]  Epilepsy Attending: Charlsie Quest  Referring Physician/Provider: Dr Delia Heady Date: 07/27/2021 Duration: 22.57 mins  Patient history: 25 year old male with seizure-like episodes.  EEG to evaluate for seizure.  Level of alertness: Lethargic  AEDs during EEG study: Propofol  Technical aspects: This EEG study was done with scalp electrodes positioned according to the 10-20 International system of electrode placement. Electrical activity was acquired at a sampling rate of 500Hz  and reviewed with a high frequency filter of 70Hz  and a low frequency filter of 1Hz . EEG data were recorded continuously and digitally stored.   Description: EEG showed an excessive amount of 15 to 18 Hz beta activity  distributed symmetrically and diffusely.   Hyperventilation and photic stimulation were not performed.     ABNORMALITY - Excessive beta, generalized  IMPRESSION: This study is within normal limits. No seizures or epileptiform discharges were seen throughout the recording.  The excessive beta activity seen in the background is most likely due to the effect of medications like benzodiazepine and is a benign EEG pattern.   Aleem Elza 

## 2021-07-27 NOTE — Progress Notes (Signed)
SLP Cancellation Note  Patient Details Name: Roy [name] MRN: [id number] DOB: 1996/02/27   Cancelled treatment:        Pt sedated on ventilator and not ready for assessment. Will continue to follow.    Royce Macadamia 07/27/2021, 1:01 PM  Breck Coons Lonell Face.Ed Nurse, children's [phone] Office [phone]

## 2021-07-27 NOTE — Progress Notes (Signed)
Patient transported from IR to 4N23 with no complications.

## 2021-07-27 NOTE — Anesthesia Procedure Notes (Signed)
Arterial Line Insertion Start/End11/04/2021 2:10 AM, 07/27/2021 2:20 AM Performed by: Adair Laundry, CRNA  Patient location: OR. Preanesthetic checklist: patient identified, IV checked and monitors and equipment checked Left, radial was placed Catheter size: 20 G Hand hygiene performed   Attempts: 1 Procedure performed without using ultrasound guided technique. Following insertion, dressing applied and Biopatch. Post procedure assessment: normal

## 2021-07-28 ENCOUNTER — Encounter (HOSPITAL_COMMUNITY): Payer: Self-pay | Admitting: Interventional Radiology

## 2021-07-28 ENCOUNTER — Inpatient Hospital Stay (HOSPITAL_COMMUNITY): Payer: Medicaid Other

## 2021-07-28 DIAGNOSIS — E876 Hypokalemia: Secondary | ICD-10-CM

## 2021-07-28 DIAGNOSIS — I7774 Dissection of vertebral artery: Secondary | ICD-10-CM

## 2021-07-28 LAB — CBC WITH DIFFERENTIAL/PLATELET
Abs Immature Granulocytes: 0.04 10*3/uL (ref 0.00–0.07)
Basophils Absolute: 0 10*3/uL (ref 0.0–0.1)
Basophils Relative: 0 %
Eosinophils Absolute: 0.1 10*3/uL (ref 0.0–0.5)
Eosinophils Relative: 1 %
HCT: 34.1 % — ABNORMAL LOW (ref 39.0–52.0)
Hemoglobin: 11.5 g/dL — ABNORMAL LOW (ref 13.0–17.0)
Immature Granulocytes: 0 %
Lymphocytes Relative: 18 %
Lymphs Abs: 2.1 10*3/uL (ref 0.7–4.0)
MCH: 30.8 pg (ref 26.0–34.0)
MCHC: 33.7 g/dL (ref 30.0–36.0)
MCV: 91.4 fL (ref 80.0–100.0)
Monocytes Absolute: 1.1 10*3/uL — ABNORMAL HIGH (ref 0.1–1.0)
Monocytes Relative: 9 %
Neutro Abs: 8.4 10*3/uL — ABNORMAL HIGH (ref 1.7–7.7)
Neutrophils Relative %: 72 %
Platelets: 313 10*3/uL (ref 150–400)
RBC: 3.73 MIL/uL — ABNORMAL LOW (ref 4.22–5.81)
RDW: 13.8 % (ref 11.5–15.5)
WBC: 11.6 10*3/uL — ABNORMAL HIGH (ref 4.0–10.5)
nRBC: 0 % (ref 0.0–0.2)

## 2021-07-28 LAB — COOXEMETRY PANEL
Carboxyhemoglobin: 0.9 % (ref 0.5–1.5)
Methemoglobin: 1.1 % (ref 0.0–1.5)
O2 Saturation: 90.3 %
Total hemoglobin: 11.9 g/dL — ABNORMAL LOW (ref 12.0–16.0)

## 2021-07-28 LAB — LIPID PANEL
Cholesterol: 199 mg/dL (ref 0–200)
HDL: 37 mg/dL — ABNORMAL LOW (ref >40)
LDL Cholesterol: 116 mg/dL — ABNORMAL HIGH (ref 0–99)
Total CHOL/HDL Ratio: 5.4 RATIO
Triglycerides: 230 mg/dL — ABNORMAL HIGH (ref <150)
VLDL: 46 mg/dL — ABNORMAL HIGH (ref 0–40)

## 2021-07-28 LAB — MAGNESIUM: Magnesium: 2 mg/dL (ref 1.7–2.4)

## 2021-07-28 LAB — BASIC METABOLIC PANEL
Anion gap: 8 (ref 5–15)
BUN: 7 mg/dL (ref 6–20)
CO2: 23 mmol/L (ref 22–32)
Calcium: 8.3 mg/dL — ABNORMAL LOW (ref 8.9–10.3)
Chloride: 106 mmol/L (ref 98–111)
Creatinine, Ser: 0.87 mg/dL (ref 0.61–1.24)
GFR, Estimated: >60 mL/min (ref >60)
Glucose, Bld: 96 mg/dL (ref 70–99)
Potassium: 3.2 mmol/L — ABNORMAL LOW (ref 3.5–5.1)
Sodium: 137 mmol/L (ref 135–145)

## 2021-07-28 LAB — GLUCOSE, CAPILLARY
Glucose-Capillary: 104 mg/dL — ABNORMAL HIGH (ref 70–99)
Glucose-Capillary: 93 mg/dL (ref 70–99)
Glucose-Capillary: 77 mg/dL (ref 70–99)
Glucose-Capillary: 86 mg/dL (ref 70–99)
Glucose-Capillary: 94 mg/dL (ref 70–99)
Glucose-Capillary: 103 mg/dL — ABNORMAL HIGH (ref 70–99)

## 2021-07-28 LAB — PHOSPHORUS: Phosphorus: 2.3 mg/dL — ABNORMAL LOW (ref 2.5–4.6)

## 2021-07-28 IMAGING — DX DG ABD PORTABLE 1V
1 series · 1 of 1 positions shown · non-contrast
Comparison: [DATE]

CLINICAL DATA: Evaluate feeding tube placement.

EXAM:
PORTABLE ABDOMEN - 1 VIEW

[abdomen kub]
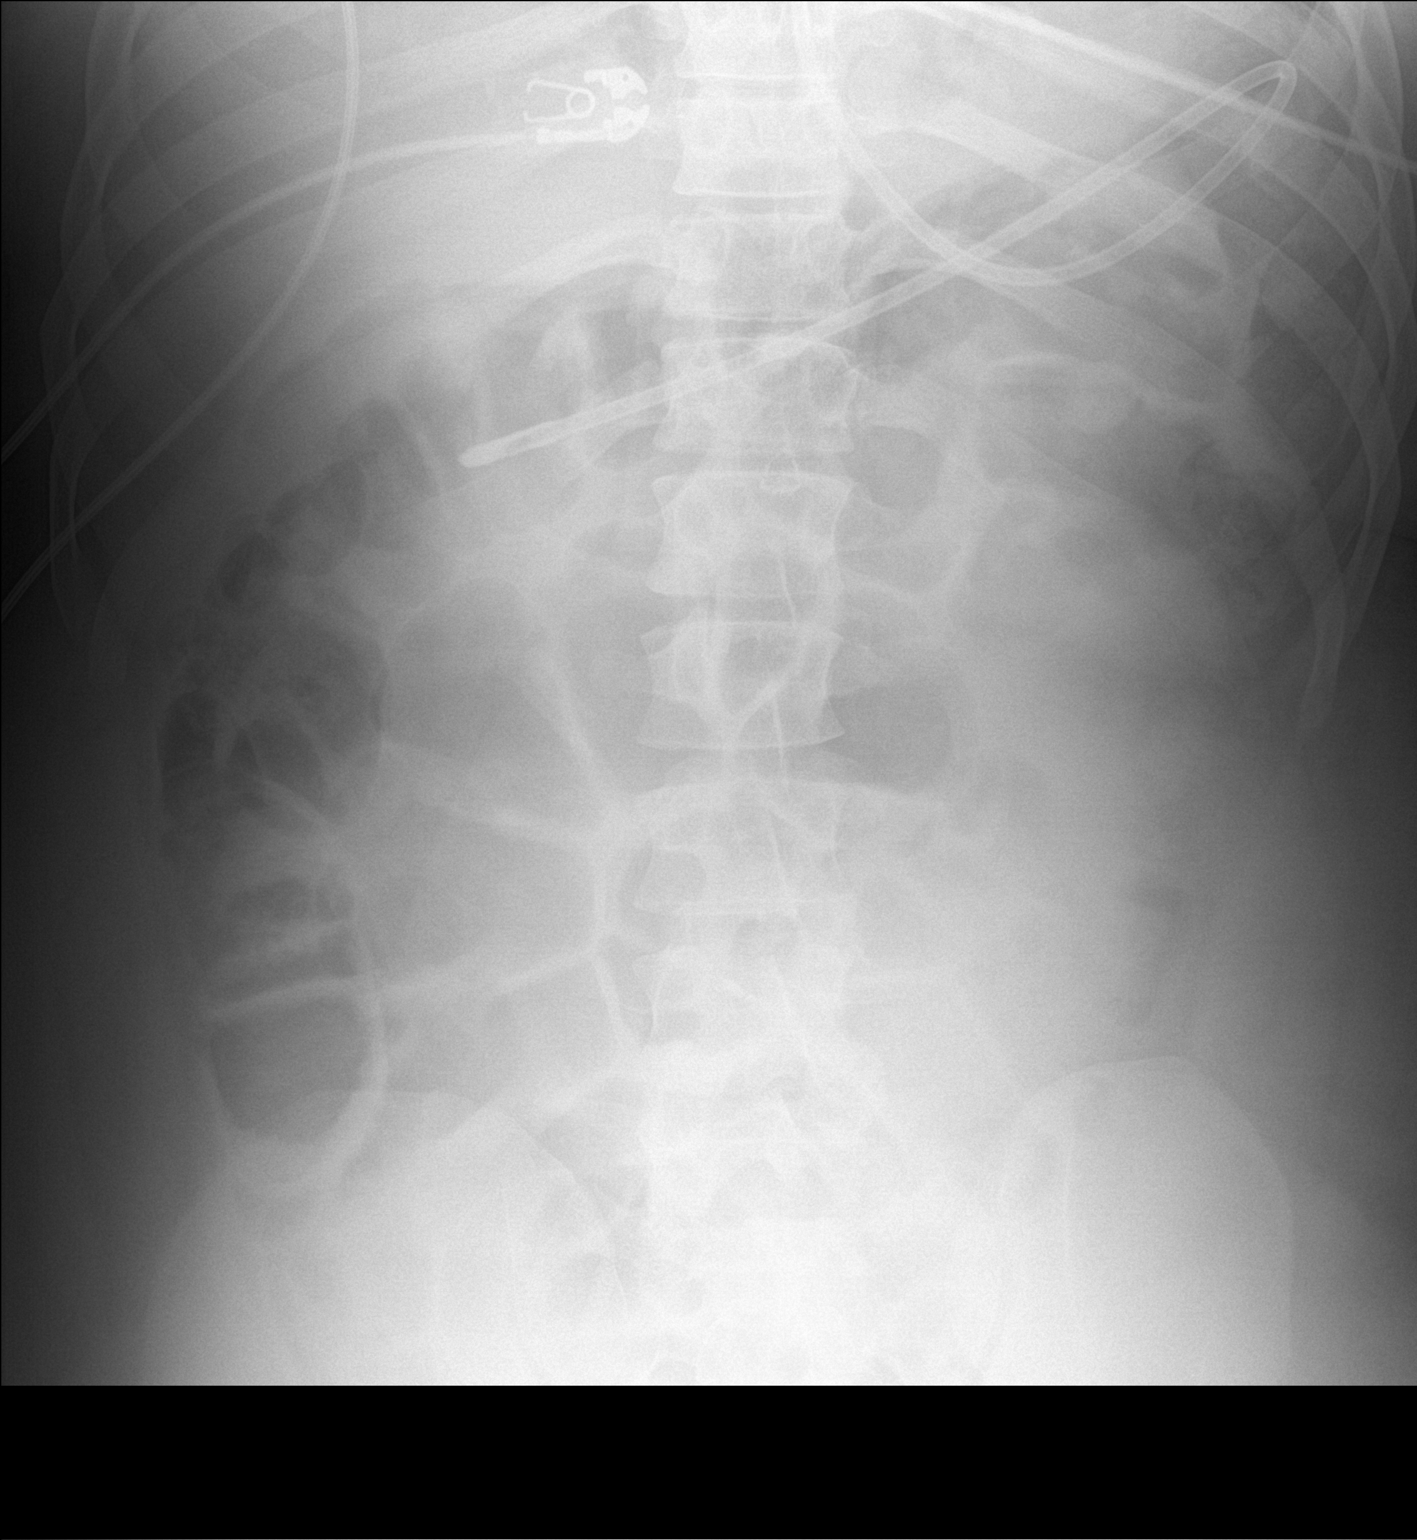

[1 of 1 positions shown; findings below may reference images not displayed]

FINDINGS: Feeding tube has been placed with tip well below the GE junction.
The tip projects over the expected location of the gastric antrum or
pylorus. Gaseous distension of the colon is again noted.
IMPRESSION: Feeding tube tip projects over the gastric antrum or pylorus.

## 2021-07-28 MED ORDER — PROSOURCE TF PO LIQD
45.0000 mL | Freq: Two times a day (BID) | ORAL | Status: DC
  Administered 2021-07-28 – 2021-07-29 (×4): 45 mL
  Filled 2021-07-28 (×4): qty 45

## 2021-07-28 MED ORDER — POTASSIUM CHLORIDE 10 MEQ/100ML IV SOLN
10.0000 meq | INTRAVENOUS | Status: AC
  Administered 2021-07-28 (×4): 10 meq via INTRAVENOUS
  Filled 2021-07-28 (×4): qty 100

## 2021-07-28 MED ORDER — POTASSIUM CHLORIDE 20 MEQ PO PACK: 20.0000 meq | PACK | ORAL | Status: DC

## 2021-07-28 MED ORDER — OSMOLITE 1.5 CAL PO LIQD
1000.0000 mL | ORAL | Status: DC
  Administered 2021-07-28 – 2021-07-29 (×2): 1000 mL

## 2021-07-28 MED ORDER — POTASSIUM CHLORIDE 20 MEQ PO PACK
20.0000 meq | PACK | ORAL | Status: AC
  Administered 2021-07-28 (×2): 20 meq
  Filled 2021-07-28 (×2): qty 1

## 2021-07-28 MED ORDER — POTASSIUM CHLORIDE 20 MEQ PO PACK
20.0000 meq | PACK | ORAL | Status: DC
  Administered 2021-07-28: 20 meq
  Filled 2021-07-28: qty 1

## 2021-07-28 MED ORDER — ADULT MULTIVITAMIN W/MINERALS CH
1.0000 | ORAL_TABLET | Freq: Every day | ORAL | Status: DC
  Administered 2021-07-28 – 2021-07-29 (×2): 1
  Filled 2021-07-28 (×2): qty 1

## 2021-07-28 NOTE — Progress Notes (Signed)
Referring Physician(s): * No referring provider recorded for this case *  Supervising Physician: Julieanne Cotton  Patient Status:  [facility] - In-pt  Chief Complaint:  S/p left vertebral artery angioplasty and stenting followed with successful mechanical thrombectomy and revascularization.  Subjective: Pt c/o double vision  O: Pt resting quietly in bed. His speech is slurred. Voice is hoarse from recent intubation, extubated prior to this exam.  Dr. Corliss Skains performs bedside neuro assessment.  He is able to answer simple yes/no or one word response questions.  He is in no distress. VSS  Allergies: Patient has no known allergies.  Medications: Prior to Admission medications   Not on File     Vital Signs: BP 138/77   Pulse 85   Temp 98.2 F (36.8 C) (Oral)   Resp (!) 30   Ht 5\' 2"  (1.575 m)   Wt 231 lb 11.3 oz (105.1 kg)   SpO2 100%   BMI 42.38 kg/m   Physical Exam Vitals reviewed.  Constitutional:      Appearance: He is obese. He is ill-appearing.  HENT:     Head: Normocephalic and atraumatic.  Cardiovascular:     Rate and Rhythm: Normal rate and regular rhythm.  Pulmonary:     Effort: Pulmonary effort is normal. No respiratory distress.  Musculoskeletal:     Right lower leg: No edema.     Left lower leg: No edema.  Skin:    General: Skin is warm and dry.  Neurological:     Comments: Alert, aware and oriented X 2 Speech slurred.  Comprehension is intact.  Right horizontal gaze palsy noted No facial droop noted Tongue midline Can spontaneously move all 4 extremities.  Hand grip weak bilaterally. Right pronator drift noted Gait not assessed Romberg not assessed Heel to toe not assessed Distal pulses not assessed     Imaging: CT ANGIO HEAD W OR WO CONTRAST  Result Date: 07/27/2021 CLINICAL DATA:  Initial evaluation for acute altered mental status, evaluate for basilar thrombus. EXAM: CT ANGIOGRAPHY HEAD AND NECK WITH CONTRAST CT VENOGRAM  HEAD WITH CONTRAST TECHNIQUE: Multidetector CT imaging of the head and neck was performed using the standard protocol during bolus administration of intravenous contrast. Multiplanar CT image reconstructions and MIPs were obtained to evaluate the vascular anatomy. Carotid stenosis measurements (when applicable) are obtained utilizing NASCET criteria, using the distal internal carotid diameter as the denominator. CONTRAST:  57mL OMNIPAQUE IOHEXOL 350 MG/ML SOLN COMPARISON:  Prior noncontrast head CT from 07/26/2021./ FINDINGS: CTA NECK FINDINGS Aortic arch: Visualized aortic arch normal in caliber with normal branch pattern. No stenosis or other abnormality about the origin of the great vessels. Right carotid system: Right common and internal carotid arteries widely patent without stenosis, dissection or occlusion. Left carotid system: Left common and internal carotid arteries widely patent without stenosis, dissection or occlusion. Vertebral arteries: Both vertebral arteries arise from the subclavian arteries. No proximal subclavian artery stenosis. Right vertebral artery widely patent within the neck without stenosis or other abnormality. On the left, there is intimal irregularity with intraluminal filling defect involving the proximal left V1 segment, concerning for acute dissection (series 7, image 130). Area of involvement begins at or just beyond the origin, and extends approximately 2.3 cm in length. Associated moderate to severe stenosis proximally (series 5, image 271). Distally, the left vertebral artery is otherwise widely patent to the skull base. Skeleton: No visible acute osseous finding. No discrete or worrisome osseous lesions. Other neck: No other acute soft  tissue abnormality within the neck. Endotracheal and enteric tubes in place. Upper chest: Mild atelectatic changes noted dependently within the visualized lungs. Visualized upper chest demonstrates no other acute finding. Review of the MIP images  confirms the above findings CTA HEAD FINDINGS Anterior circulation: Both internal carotid arteries widely patent to the termini without stenosis. A1 segments widely patent. Normal anterior communicating artery complex. Both anterior cerebral arteries widely patent to their distal aspects without stenosis. No M1 stenosis or occlusion. Normal MCA bifurcations. Distal MCA branches well perfused and symmetric. Posterior circulation: Both V4 segments patent to the vertebrobasilar junction without stenosis or other abnormality. Both PICA origins patent and normal. Basilar widely patent proximally. There is some a subtle irregular filling defect involving the distal basilar artery/basilar tip, concerning for intraluminal thrombus (series 7, image 128). Secondary mild to moderate narrowing at the basilar tip. Neither superior cerebellar artery well visualized at the origin., and could be occluded. Irregular partially occlusive filling defect seen involving the left P1 segment with resultant moderate to severe stenosis (series 7, image 129). Left PCA otherwise widely patent distally. On the right, there is acute occlusion at the distal right P2 segment (series 7, image 141). Perfusion within right PCA branches distally could be related to subocclusive thrombus and/or collateralization. Venous sinuses: Dedicated venogram images were performed. Normal enhancement seen throughout the superior sagittal sinus to the level of the torcula. Transverse and sigmoid sinuses are patent bilaterally. Proximal internal jugular veins patent. Straight sinus, vein of Galen, internal cerebral veins, and basal veins of Rosenthal are patent. No visible abnormality about the cavernous sinus. Superior orbital veins grossly symmetric and within normal limits. No evidence for dural sinus thrombosis. No appreciable cortical venous thrombosis. Anatomic variants: None significant.  No aneurysm. Review of the MIP images confirms the above findings  IMPRESSION: 1. 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 2. Subtle irregular nonocclusive filling defect involving the distal basilar artery/basilar tip, consistent with associated distal embolic disease. Adjacent superior cerebellar arteries are not visualized at their origins, and could be occluded. 3. Associated partially occlusive thrombus with severe stenosis involving the left P1 segment. 4. Additional distal right P2 embolic occlusion. 5. Wide patency of both carotid artery systems and anterior circulation. 6. Negative CT venogram.  No evidence for dural sinus thrombosis. Critical Value/emergent results were called by telephone at the time of interpretation on 07/27/2021 at 1:26 am to provider [facility] , who verbally acknowledged these results. Electronically Signed   By: Rise Mu M.D.   On: 07/27/2021 02:08   DG Pelvis 1-2 Views  Result Date: 07/27/2021 CLINICAL DATA:  Stroke EXAM: PELVIS - 1-2 VIEW COMPARISON:  CT angiography head 07/27/2021 FINDINGS: Approximally 17 cm oval opacity overlying the pelvis likely represents a contrast filled urinary bladder due to recent intravenous contrast administration. Markedly limited evaluation due to overlapping osseous structures and overlying soft tissues. There is no evidence of pelvic fracture or diastasis. No pelvic bone lesions are seen. IMPRESSION: Urinary bladder lumen filled with excreted intravenous contrast. Electronically Signed   By: Tish Frederickson M.D.   On: 07/27/2021 15:14   CT HEAD WO CONTRAST ( )  Result Date: 07/27/2021 CLINICAL DATA:  Follow-up suspected intracranial hemorrhage EXAM: CT HEAD WITHOUT CONTRAST TECHNIQUE: Contiguous axial images were obtained from the base of the skull through the vertex without intravenous contrast. COMPARISON:  None. FINDINGS: Brain: No evidence of acute infarction, hemorrhage,  hydrocephalus, extra-axial collection or  mass lesion/mass effect. Vascular: No hyperdense vessel or unexpected calcification. Skull: Normal. Negative for fracture or focal lesion. Sinuses/Orbits: No acute finding. Other: None. IMPRESSION: No acute intracranial pathology. No evidence of intracranial hemorrhage. Electronically Signed   By: Jearld Lesch M.D.   On: 07/27/2021 09:03   CT Head Wo Contrast  Result Date: 07/26/2021 CLINICAL DATA:  Seizure. EXAM: CT HEAD WITHOUT CONTRAST TECHNIQUE: Contiguous axial images were obtained from the base of the skull through the vertex without intravenous contrast. COMPARISON:  None. FINDINGS: Brain: No evidence of acute infarction, hemorrhage, hydrocephalus, extra-axial collection or mass lesion/mass effect. Vascular: No hyperdense vessel or unexpected calcification. Skull: Normal. Negative for fracture or focal lesion. Sinuses/Orbits: There is mucoperiosteal thickening of paranasal sinuses and complete opacification of the nasal passages. No air-fluid. The mastoid air cells are clear. Other: None IMPRESSION: 1. Normal noncontrast CT of the brain. 2. Paranasal sinus disease. Electronically Signed   By: Elgie Collard M.D.   On: 07/26/2021 23:51   CT ANGIO NECK W OR WO CONTRAST  Result Date: 07/27/2021 CLINICAL DATA:  Initial evaluation for acute altered mental status, evaluate for basilar thrombus. EXAM: CT ANGIOGRAPHY HEAD AND NECK WITH CONTRAST CT VENOGRAM HEAD WITH CONTRAST TECHNIQUE: Multidetector CT imaging of the head and neck was performed using the standard protocol during bolus administration of intravenous contrast. Multiplanar CT image reconstructions and MIPs were obtained to evaluate the vascular anatomy. Carotid stenosis measurements (when applicable) are obtained utilizing NASCET criteria, using the distal internal carotid diameter as the denominator. CONTRAST:  60mL OMNIPAQUE IOHEXOL 350 MG/ML SOLN COMPARISON:  Prior noncontrast head CT from  07/26/2021./ FINDINGS: CTA NECK FINDINGS Aortic arch: Visualized aortic arch normal in caliber with normal branch pattern. No stenosis or other abnormality about the origin of the great vessels. Right carotid system: Right common and internal carotid arteries widely patent without stenosis, dissection or occlusion. Left carotid system: Left common and internal carotid arteries widely patent without stenosis, dissection or occlusion. Vertebral arteries: Both vertebral arteries arise from the subclavian arteries. No proximal subclavian artery stenosis. Right vertebral artery widely patent within the neck without stenosis or other abnormality. On the left, there is intimal irregularity with intraluminal filling defect involving the proximal left V1 segment, concerning for acute dissection (series 7, image 130). Area of involvement begins at or just beyond the origin, and extends approximately 2.3 cm in length. Associated moderate to severe stenosis proximally (series 5, image 271). Distally, the left vertebral artery is otherwise widely patent to the skull base. Skeleton: No visible acute osseous finding. No discrete or worrisome osseous lesions. Other neck: No other acute soft tissue abnormality within the neck. Endotracheal and enteric tubes in place. Upper chest: Mild atelectatic changes noted dependently within the visualized lungs. Visualized upper chest demonstrates no other acute finding. Review of the MIP images confirms the above findings CTA HEAD FINDINGS Anterior circulation: Both internal carotid arteries widely patent to the termini without stenosis. A1 segments widely patent. Normal anterior communicating artery complex. Both anterior cerebral arteries widely patent to their distal aspects without stenosis. No M1 stenosis or occlusion. Normal MCA bifurcations. Distal MCA branches well perfused and symmetric. Posterior circulation: Both V4 segments patent to the vertebrobasilar junction without stenosis or  other abnormality. Both PICA origins patent and normal. Basilar widely patent proximally. There is some a subtle irregular filling defect involving the distal basilar artery/basilar tip, concerning for intraluminal thrombus (series 7, image 128). Secondary mild to moderate narrowing at the basilar tip. Neither superior  cerebellar artery well visualized at the origin., and could be occluded. Irregular partially occlusive filling defect seen involving the left P1 segment with resultant moderate to severe stenosis (series 7, image 129). Left PCA otherwise widely patent distally. On the right, there is acute occlusion at the distal right P2 segment (series 7, image 141). Perfusion within right PCA branches distally could be related to subocclusive thrombus and/or collateralization. Venous sinuses: Dedicated venogram images were performed. Normal enhancement seen throughout the superior sagittal sinus to the level of the torcula. Transverse and sigmoid sinuses are patent bilaterally. Proximal internal jugular veins patent. Straight sinus, vein of Galen, internal cerebral veins, and basal veins of Rosenthal are patent. No visible abnormality about the cavernous sinus. Superior orbital veins grossly symmetric and within normal limits. No evidence for dural sinus thrombosis. No appreciable cortical venous thrombosis. Anatomic variants: None significant.  No aneurysm. Review of the MIP images confirms the above findings IMPRESSION: 1. 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 2. Subtle irregular nonocclusive filling defect involving the distal basilar artery/basilar tip, consistent with associated distal embolic disease. Adjacent superior cerebellar arteries are not visualized at their origins, and could be occluded. 3. Associated partially occlusive thrombus with severe stenosis involving the left P1 segment. 4. Additional  distal right P2 embolic occlusion. 5. Wide patency of both carotid artery systems and anterior circulation. 6. Negative CT venogram.  No evidence for dural sinus thrombosis. Critical Value/emergent results were called by telephone at the time of interpretation on 07/27/2021 at 1:26 am to provider [facility] , who verbally acknowledged these results. Electronically Signed   By: Rise Mu M.D.   On: 07/27/2021 02:08   MR BRAIN WO CONTRAST  Result Date: 07/27/2021 CLINICAL DATA:  Neuro deficit, acute, stroke suspected. Status post mechanical thrombectomy. EXAM: MRI HEAD WITHOUT CONTRAST TECHNIQUE: Multiplanar, multiecho pulse sequences of the brain and surrounding structures were obtained without intravenous contrast. COMPARISON:  Head CT July 27, 2021. FINDINGS: Brain: Scattered areas of restricted diffusion are seen involving the right thalamus, medial aspect of the right occipital and temporal lobes, including right hippocampus, centrally in the midbrain, on the left side of the pons and in the bilateral cerebellar hemispheres consistent with acute infarcts. Petechial hemorrhage seen in the left inferior cerebellar hemisphere and right occipital lobe. No hydrocephalus, extra-axial collection or mass lesion. Vascular: Normal flow voids. Skull and upper cervical spine: Normal marrow signal. Sinuses/Orbits: Mucosal thickening throughout the paranasal sinuses with fluid level within the sphenoid sinuses. The orbits are maintained. Other: Presence of endotracheal tube. IMPRESSION: Acute infarcts involving the right thalamus, medial aspect of the right occipital and temporal lobes, including right hippocampus, centrally in the midbrain, on the left side of the pons and in the bilateral cerebellar hemispheres. Petechial hemorrhage seen in the left inferior cerebellar hemisphere right occipital lobe. No significant mass effect. Electronically Signed   By: Baldemar Lenis M.D.   On:  07/27/2021 16:40   IR Arnette Schaumann Verline Lema Or Car A Stent  Result Date: 07/28/2021 INDICATION: Onset of seizures, decreased level of consciousness. 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 EMERGENT LARGE VESSEL OCCLUSION THROMBOLYSIS (POSTERIOR CIRCULATION) COMPARISON:  CT angiogram of the head and neck July 27, 2021. MEDICATIONS: Ancef 2 g IV antibiotic was administered within 1  hour of the procedure. ANESTHESIA/SEDATION: General anesthesia. CONTRAST:  Omnipaque 300 proximally 130 mL. FLUOROSCOPY TIME:  Fluoroscopy Time: 72 minutes 42 seconds (4188 mGy). COMPLICATIONS: None immediate. TECHNIQUE: Following a full explanation of the procedure along with the potential associated complications, an informed witnessed consent was obtained. The risks of intracranial hemorrhage of 10%, worsening neurological deficit, ventilator dependency, death and inability to revascularize were all reviewed in detail with the patient's sister. The patient was then put under general anesthesia by the Department of Anesthesiology at [facility]. The right groin was prepped and draped in the usual sterile fashion. Thereafter using modified Seldinger technique, transfemoral access into the right common femoral artery was obtained without difficulty. Over a 0.035 inch guidewire an 8 [place] Pinnacle 25 cm sheath was inserted. Through this, and also over a 0.035 inch guidewire a 5 [place] JB 1 catheter was advanced to the aortic arch region and selectively positioned in the right common carotid artery, the right subclavian artery, the left vertebral artery. FINDINGS: The right common carotid arteriogram demonstrates the right external carotid artery and its major branches to be widely patent. The right internal carotid artery at the bulb to the  cranial skull base is widely patent. There is a mild to moderate stenosis at the cervical petrous junction. More distally, the petrous, the cavernous and the supraclinoid segments are widely patent. The right middle cerebral artery and the right anterior cerebral artery opacify into the capillary and venous phases. Transient cross-filling via the anterior communicating artery of the left anterior cerebral A2 segment and distally is seen. The innominate arteriogram demonstrates the right common carotid artery proximally, and the right subclavian artery proximally to be widely patent. The right vertebral artery is seen to be patent with opacification made to the cranial skull base. The left vertebral artery arteriogram demonstrates abnormal appearance in the proximal 1/3 starting at the origin. There is a tapered irregularity associated with severe stenosis and a prominent smooth filling defect. More distally, the vessel opacifies normally to the cranial skull base. Patency is seen of the left vertebrobasilar junction and the left posterior-inferior cerebellar artery. Prominent filling defects are seen in the distal basilar artery, and the left posterior cerebral artery at its origin, and the right posterior cerebral artery in the distal P1 segment. PROCEDURE: The diagnostic JB 1 catheter in the distal right subclavian artery was then exchanged over a 0.035 inch 300 cm Rosen exchange guidewire for a 90 cm 8 [place] Neuron Max sheath, which was advanced to the distal right subclavian artery. The guidewire was removed. Good aspiration obtained from the hub of the Neuron Max sheath. A control arteriogram performed through the Neuron Max sheath after it had been withdrawn just proximal to the origin of the right vertebral artery demonstrates no evidence of spasms, dissections or of intraluminal filling defects. Over a 0.035 Roadrunner guidewire, a diagnostic JB 1 catheter was advanced within the Neuron Max sheath to its  distal end. The vertebral artery was then selected with the guidewire followed by the 5 French diagnostic catheter which, the 8 [place] Neuron Max sheath was advanced to the distal cervical right ICA. The guidewire, and the support catheter were removed. Good aspiration obtained from the hub of the Neuron Max sheath. A gentle control arteriogram performed through this demonstrated no evidence of spasms, dissections or of intraluminal filling defects. The opacified right vertebral artery distally, the right vertebrobasilar junction and the right posterior-inferior cerebellar artery demonstrate wide patency. Cross-filling of the left vertebrobasilar  junction was noted. The basilar artery and its proximal 2/3, the anterior-inferior cerebellar arteries demonstrate uniform enhancement. However, prominent pre occlusive filling defects noted in the distal 1/3 of the basilar artery extending into the right posterior cerebral artery distal P1 segment, and the origin of the left posterior cerebral artery. ENDOVASCULAR REVASCULARIZATION OF DISTAL BASILAR ARTERY THROMBOSIS, AND OF THE POSTERIOR CEREBRAL ARTERIES AND OF THE SYMPTOMATIC DISSECTED PROXIMAL LEFT VERTEBRAL ARTERY EXTENDING TO THE ORIGIN. Over a 0.014 inch standard Synchro micro guidewire with a moderate J configuration, the combination of a 162 cm 021 microcatheter inside of an 071 132 cm Zoom aspiration catheter combination was advanced to the right vertebrobasilar junction. The micro guidewire was then gently advanced more distally into the distal basilar artery initially and then into the left posterior cerebral artery distal P2 segment. The guidewire was removed. Good aspiration obtained from the hub of the microcatheter. Gentle control arteriogram performed through the microcatheter demonstrated safe positioning of the tip of the microcatheter. This was then connected to continuous heparinized saline infusion. At this time, a 4 mm x 40 mm Solitaire X retrieval  device was advanced to the distal end of the microcatheter. The O ring on the delivery microcatheter was loosened. With slight forward gentle traction with the right hand on the delivery micro guidewire, with the left hand the delivery microcatheter was unsheathed deploying the retrieval device. The proximal portion of the device was seen in the distal basilar artery. Thereafter, with constant aspiration with a Penumbra aspiration device at the hub of the 071 Zoom aspiration catheter and a 60 mL syringe at the hub of the Neuron Max sheath for approximately 3-1/2 minutes, the combination of the retrieval device, the 071 Zoom aspiration catheter, and the microcatheter were retrieved and removed. A control arteriogram performed through the Neuron Max sheath in the distal right vertebral artery demonstrated near complete revascularization of the distal basilar artery and the posterior cerebral arteries. A small filling defect was noted in the distal left superior cerebellar artery. This was deemed too distal and small for endovascular retrieval. A TICI 2C reperfusion was achieved. The sheath was then retrieved and positioned at the origin of the left vertebral artery. A control arteriogram performed through the Neuron Max sheath just proximal to the origin of the left vertebral artery continued to demonstrate sequela of a long segment dissection associated with an intimal flap, and a mural hematoma. Associated significant narrowing was noted. However, more distally, the contrast was seen to the cranial skull base into the left vertebrobasilar junction and the left posterior-inferior cerebellar artery. 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 A 2.4 mm x 5 mm Emboshield device was then prepped and purged of air in its housing. The device was then retrieved into the delivery microcatheter. The combination of the 014 inch and the delivery microcatheter was retrieved from the housing. A J configuration was  given to the tip of the micro guidewire. Under constant roadmap technique and constant fluoroscopic guidance, the combination of the Emboshield delivery catheter, and the micro guidewire was advanced to just proximal to the origin of the left vertebral artery. Using a torque device, access with the micro guidewire was first obtained without contact with the underlying dissected segment. The combination was then advanced without difficulty to the cranial skull base to the level of C1-C2. In the usual manner, the filter device was then deployed. The delivery microcatheter was retrieved and removed while maintaining constant positioning of tip of the micro  guidewire. Measurements were then performed of the proximal left vertebral artery in its most normal segment. The left dissection was also measured. It was decided to proceed with placement of a 3.5 mm x 34 mm Onyx Frontier balloon mountable stent. This was prepped with 75% contrast and 25% heparinized saline infusion antegradely, and with just heparinized saline infusion retrogradely. Thereafter using the rapid exchange technique, the combination was advanced without difficulty to the proximal left vertebral artery. The proximal and the distal landing zones were then identified. The stent was then deployed by inflating the balloon mountable stent using micro inflation syringe device via micro tubing to just over 3 mm where it was maintained for approximately 15 seconds. Thereafter, the balloon was deflated and removed. A control arteriogram performed through Neuron Max sheath just proximal to the origin of the left vertebral artery now demonstrated excellent apposition of the stent with wide patency of the left vertebral artery proximally. More distally, this contrast flow was noted into the left vertebrobasilar junction, the basilar artery and the just revascularized posterior cerebral arteries. No evidence of intraluminal filling defects was seen extra cranially  or intracranially. The filter capture device was then advanced again using the rapid exchange technique to just the proximal marker on the device. The filter device was then captured into the capture device by retrieving on the micro guidewire. The combination was then retrieved slowly under constant fluoroscopic guidance ensuring no entanglement with the planted stent in the proximal left vertebral artery. Control arteriogram was then performed at 10 and 20 minutes post deployment of the stent. These continued to demonstrate excellent flow in the proximal left vertebral artery. Intracranially the TICI 2C revascularization was maintained. Following this, the Neuron Max sheath was then retrieved and removed. The 8 French sheath was removed with hemostasis achieved at the right groin puncture site with a 6 French Angio-Seal closure device. Distal pulses remained palpable in both feet unchanged. A CT of the brain obtained demonstrated no evidence of intracranial hemorrhage or hydrocephalus. Prior to planting the stent at the second site in the proximal left vertebral artery, patient was given 81 mg of aspirin, and 180 mg of Brilinta via an orogastric tube. Also right after stent placement, patient was given a bolus dose of cangrelor followed by a low-dose four hour infusion. Patient was left intubated on account of a pre procedural clinical condition. He was then transferred to the neuro ICU for post revascularization measures. IMPRESSION: Status post endovascular revascularization of occluded distal basilar artery and both posterior cerebral arteries as described above with a 4 mm x 4 mm Solitaire X retrieval device and contact aspiration achieving a TICI score of 2C. 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 PLAN: Follow-up in the clinic 2 weeks post discharge. Electronically Signed   By:  Julieanne Cotton M.D.   On: 07/28/2021 08:14   IR CT Head Ltd  Result Date: 07/28/2021 INDICATION: Onset of seizures, decreased level of consciousness. 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 EMERGENT LARGE VESSEL OCCLUSION THROMBOLYSIS (POSTERIOR CIRCULATION) COMPARISON:  CT angiogram of the head and neck July 27, 2021. MEDICATIONS: Ancef 2 g IV antibiotic was administered within 1 hour of the procedure. ANESTHESIA/SEDATION: General anesthesia. CONTRAST:  Omnipaque 300 proximally 130 mL. FLUOROSCOPY TIME:  Fluoroscopy Time: 72 minutes 42 seconds (4188 mGy). COMPLICATIONS: None immediate. TECHNIQUE: Following a full explanation of the procedure along with the potential associated complications, an informed witnessed consent was obtained. The risks of intracranial hemorrhage of 10%, worsening neurological deficit, ventilator dependency, death and inability to revascularize were all reviewed in detail with the patient's sister. The patient was then put under general anesthesia by the Department of Anesthesiology at [facility]. The right groin was prepped and draped in the usual sterile fashion. Thereafter using modified Seldinger technique, transfemoral access into the right common femoral artery was obtained without difficulty. Over a 0.035 inch guidewire an 8 [place] Pinnacle 25 cm sheath was inserted. Through this, and also over a 0.035 inch guidewire a 5 [place] JB 1 catheter was advanced to the aortic arch region and selectively positioned in the right common carotid artery, the right subclavian artery, the left vertebral artery. FINDINGS: The right common carotid arteriogram demonstrates the right external carotid artery and its major branches to be widely patent. The right internal carotid artery at the bulb to the  cranial skull base is widely patent. There is a mild to moderate stenosis at the cervical petrous junction. More distally, the petrous, the cavernous and the supraclinoid segments are widely patent. The right middle cerebral artery and the right anterior cerebral artery opacify into the capillary and venous phases. Transient cross-filling via the anterior communicating artery of the left anterior cerebral A2 segment and distally is seen. The innominate arteriogram demonstrates the right common carotid artery proximally, and the right subclavian artery proximally to be widely patent. The right vertebral artery is seen to be patent with opacification made to the cranial skull base. The left vertebral artery arteriogram demonstrates abnormal appearance in the proximal 1/3 starting at the origin. There is a tapered irregularity associated with severe stenosis and a prominent smooth filling defect. More distally, the vessel opacifies normally to the cranial skull base. Patency is seen of the left vertebrobasilar junction and the left posterior-inferior cerebellar artery. Prominent filling defects are seen in the distal basilar artery, and the left posterior cerebral artery at its origin, and the right posterior cerebral artery in the distal P1 segment. PROCEDURE: The diagnostic JB 1 catheter in the distal right subclavian artery was then exchanged over a 0.035 inch 300 cm Rosen exchange guidewire for a 90 cm 8 [place] Neuron Max sheath, which was advanced to the distal right subclavian artery. The guidewire was removed. Good aspiration obtained from the hub of the Neuron Max sheath. A control arteriogram performed through the Neuron Max sheath after it had been withdrawn just proximal to the origin of the right vertebral artery demonstrates no evidence of spasms, dissections or of intraluminal filling defects. Over a 0.035 Roadrunner guidewire, a diagnostic JB 1 catheter was advanced within the Neuron Max sheath to its  distal end. The vertebral artery was then selected with the guidewire followed by the 5 French diagnostic catheter which, the 8 [place] Neuron Max sheath was advanced to the distal cervical right ICA. The guidewire, and the support catheter were removed. Good aspiration obtained from the hub of the Neuron Max sheath. A gentle control arteriogram performed through this demonstrated no evidence of spasms, dissections or of intraluminal filling defects. The opacified right vertebral artery distally, the right vertebrobasilar junction and the right posterior-inferior cerebellar artery demonstrate wide patency. Cross-filling of the left vertebrobasilar junction was noted. The basilar artery and its proximal 2/3, the anterior-inferior cerebellar arteries demonstrate uniform enhancement.  However, prominent pre occlusive filling defects noted in the distal 1/3 of the basilar artery extending into the right posterior cerebral artery distal P1 segment, and the origin of the left posterior cerebral artery. ENDOVASCULAR REVASCULARIZATION OF DISTAL BASILAR ARTERY THROMBOSIS, AND OF THE POSTERIOR CEREBRAL ARTERIES AND OF THE SYMPTOMATIC DISSECTED PROXIMAL LEFT VERTEBRAL ARTERY EXTENDING TO THE ORIGIN. Over a 0.014 inch standard Synchro micro guidewire with a moderate J configuration, the combination of a 162 cm 021 microcatheter inside of an 071 132 cm Zoom aspiration catheter combination was advanced to the right vertebrobasilar junction. The micro guidewire was then gently advanced more distally into the distal basilar artery initially and then into the left posterior cerebral artery distal P2 segment. The guidewire was removed. Good aspiration obtained from the hub of the microcatheter. Gentle control arteriogram performed through the microcatheter demonstrated safe positioning of the tip of the microcatheter. This was then connected to continuous heparinized saline infusion. At this time, a 4 mm x 40 mm Solitaire X retrieval  device was advanced to the distal end of the microcatheter. The O ring on the delivery microcatheter was loosened. With slight forward gentle traction with the right hand on the delivery micro guidewire, with the left hand the delivery microcatheter was unsheathed deploying the retrieval device. The proximal portion of the device was seen in the distal basilar artery. Thereafter, with constant aspiration with a Penumbra aspiration device at the hub of the 071 Zoom aspiration catheter and a 60 mL syringe at the hub of the Neuron Max sheath for approximately 3-1/2 minutes, the combination of the retrieval device, the 071 Zoom aspiration catheter, and the microcatheter were retrieved and removed. A control arteriogram performed through the Neuron Max sheath in the distal right vertebral artery demonstrated near complete revascularization of the distal basilar artery and the posterior cerebral arteries. A small filling defect was noted in the distal left superior cerebellar artery. This was deemed too distal and small for endovascular retrieval. A TICI 2C reperfusion was achieved. The sheath was then retrieved and positioned at the origin of the left vertebral artery. A control arteriogram performed through the Neuron Max sheath just proximal to the origin of the left vertebral artery continued to demonstrate sequela of a long segment dissection associated with an intimal flap, and a mural hematoma. Associated significant narrowing was noted. However, more distally, the contrast was seen to the cranial skull base into the left vertebrobasilar junction and the left posterior-inferior cerebellar artery. 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 A 2.4 mm x 5 mm Emboshield device was then prepped and purged of air in its housing. The device was then retrieved into the delivery microcatheter. The combination of the 014 inch and the delivery microcatheter was retrieved from the housing. A J configuration was  given to the tip of the micro guidewire. Under constant roadmap technique and constant fluoroscopic guidance, the combination of the Emboshield delivery catheter, and the micro guidewire was advanced to just proximal to the origin of the left vertebral artery. Using a torque device, access with the micro guidewire was first obtained without contact with the underlying dissected segment. The combination was then advanced without difficulty to the cranial skull base to the level of C1-C2. In the usual manner, the filter device was then deployed. The delivery microcatheter was retrieved and removed while maintaining constant positioning of tip of the micro guidewire. Measurements were then performed of the proximal left vertebral artery in its most normal segment. The  left dissection was also measured. It was decided to proceed with placement of a 3.5 mm x 34 mm Onyx Frontier balloon mountable stent. This was prepped with 75% contrast and 25% heparinized saline infusion antegradely, and with just heparinized saline infusion retrogradely. Thereafter using the rapid exchange technique, the combination was advanced without difficulty to the proximal left vertebral artery. The proximal and the distal landing zones were then identified. The stent was then deployed by inflating the balloon mountable stent using micro inflation syringe device via micro tubing to just over 3 mm where it was maintained for approximately 15 seconds. Thereafter, the balloon was deflated and removed. A control arteriogram performed through Neuron Max sheath just proximal to the origin of the left vertebral artery now demonstrated excellent apposition of the stent with wide patency of the left vertebral artery proximally. More distally, this contrast flow was noted into the left vertebrobasilar junction, the basilar artery and the just revascularized posterior cerebral arteries. No evidence of intraluminal filling defects was seen extra cranially  or intracranially. The filter capture device was then advanced again using the rapid exchange technique to just the proximal marker on the device. The filter device was then captured into the capture device by retrieving on the micro guidewire. The combination was then retrieved slowly under constant fluoroscopic guidance ensuring no entanglement with the planted stent in the proximal left vertebral artery. Control arteriogram was then performed at 10 and 20 minutes post deployment of the stent. These continued to demonstrate excellent flow in the proximal left vertebral artery. Intracranially the TICI 2C revascularization was maintained. Following this, the Neuron Max sheath was then retrieved and removed. The 8 French sheath was removed with hemostasis achieved at the right groin puncture site with a 6 French Angio-Seal closure device. Distal pulses remained palpable in both feet unchanged. A CT of the brain obtained demonstrated no evidence of intracranial hemorrhage or hydrocephalus. Prior to planting the stent at the second site in the proximal left vertebral artery, patient was given 81 mg of aspirin, and 180 mg of Brilinta via an orogastric tube. Also right after stent placement, patient was given a bolus dose of cangrelor followed by a low-dose four hour infusion. Patient was left intubated on account of a pre procedural clinical condition. He was then transferred to the neuro ICU for post revascularization measures. IMPRESSION: Status post endovascular revascularization of occluded distal basilar artery and both posterior cerebral arteries as described above with a 4 mm x 4 mm Solitaire X retrieval device and contact aspiration achieving a TICI score of 2C. 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 PLAN: Follow-up in the clinic 2 weeks post discharge. Electronically Signed   By:  Julieanne Cotton M.D.   On: 07/28/2021 08:14   DG Chest Port 1 View  Result Date: 07/27/2021 CLINICAL DATA:  Encounter for intubation EXAM: PORTABLE CHEST 1 VIEW COMPARISON:  Chest radiograph 07/26/2021 FINDINGS: Endotracheal tube overlies the midthoracic trachea. Enteric tube tip and side port overlie the stomach. There is a new left neck stent. Unchanged cardiomediastinal silhouette. No focal airspace consolidation. No large pleural effusion or visible pneumothorax. There is no acute osseous abnormality. IMPRESSION: Endotracheal tube overlies the midthoracic trachea. No new airspace disease. Electronically Signed   By: Caprice Renshaw M.D.   On: 07/27/2021 08:57   DG Chest Portable 1 View  Result Date: 07/26/2021 CLINICAL DATA:  Status post intubation. EXAM: PORTABLE  CHEST 1 VIEW COMPARISON:  None. FINDINGS: Endotracheal tube with tip approximately 2 cm above the carina. Enteric tube extends below the diaphragm with tip beyond the inferior margin of the image. Mild cardiomegaly with mild central vascular congestion. No focal consolidation, pleural effusion or pneumothorax. No acute osseous pathology. IMPRESSION: 1. Endotracheal tube above the carina. 2. Mild cardiomegaly with mild central vascular congestion. No focal consolidation. Electronically Signed   By: Elgie Collard M.D.   On: 07/26/2021 23:06   DG Abd Portable 1V  Result Date: 07/27/2021 CLINICAL DATA:  NG tube placement EXAM: PORTABLE ABDOMEN - 1 VIEW COMPARISON:  None. FINDINGS: Endotracheal tube overlies the midthoracic trachea. Nasogastric tube side port overlies the body of the stomach, tip overlying the distal stomach. There is gas distention of bowel in the abdomen. IMPRESSION: Nasogastric tube tip and side port overlie the stomach. Gaseous distension of bowel in the abdomen. Electronically Signed   By: Caprice Renshaw M.D.   On: 07/27/2021 09:50   EEG adult  Result Date: 07/27/2021 Charlsie Quest, MD     07/27/2021  1:54 PM Patient  Name: Mcdonald Reiling MRN: [id number] Epilepsy Attending: Charlsie Quest Referring Physician/Provider: Dr Delia Heady Date: 07/27/2021 Duration: 22.57 mins Patient history: 25 year old male with seizure-like episodes.  EEG to evaluate for seizure. Level of alertness: Lethargic AEDs during EEG study: Propofol Technical aspects: This EEG study was done with scalp electrodes positioned according to the 10-20 International system of electrode placement. Electrical activity was acquired at a sampling rate of 500Hz  and reviewed with a high frequency filter of 70Hz  and a low frequency filter of 1Hz . EEG data were recorded continuously and digitally stored. Description: EEG showed an excessive amount of 15 to 18 Hz beta activity  distributed symmetrically and diffusely.   Hyperventilation and photic stimulation were not performed.   ABNORMALITY - Excessive beta, generalized IMPRESSION: This study is within normal limits. No seizures or epileptiform discharges were seen throughout the recording. The excessive beta activity seen in the background is most likely due to the effect of medications like benzodiazepine and is a benign EEG pattern. Priyanka   Overnight EEG with video  Result Date: 07/28/2021 , MD     07/28/2021  9:16 AM Patient Name: Massey Ruhland MRN: Charlsie Quest Epilepsy Attending: 13/05/2021 Referring Physician/Provider: Dr Billey Chang Duration: 07/27/2021 1058 to 07/28/2021 0915  Patient history: 25 year old male with seizure-like episodes.  EEG to evaluate for seizure.  Level of alertness: Lethargic  AEDs during EEG study: Propofol  Technical aspects: This EEG study was done with scalp electrodes positioned according to the 10-20 International system of electrode placement. Electrical activity was acquired at a sampling rate of 500Hz  and reviewed with a high frequency filter of 70Hz  and a low frequency filter of 1Hz . EEG data were recorded continuously and digitally  stored.  Description: EEG initially showed an excessive amount of 15 to 18 Hz beta activity  distributed symmetrically and diffusely. Gradually, EEG showed posterior dominant rhythm of 9 Hz activity of moderate voltage (25-35 uV) seen predominantly in posterior head regions, symmetric and reactive to eye opening and eye closing.   Hyperventilation and photic stimulation were not performed.    ABNORMALITY - Excessive beta, generalized  IMPRESSION: This study is within normal limits. No seizures or epileptiform discharges were seen throughout the recording.  The excessive beta activity seen in the background is most likely due to the effect of medications like benzodiazepine and is a benign  EEG pattern.  Charlsie Quest   ECHOCARDIOGRAM COMPLETE  Result Date: 07/27/2021    ECHOCARDIOGRAM REPORT   Patient Name:   SMILEY BIRR Date of Exam: 07/27/2021 Medical Rec #:  [id number]       Height:       62.0 in Accession #:    [id number]      Weight:       210.0 lb Date of Birth:  1996/04/05       BSA:          1.952 m Patient Age:    24 years        BP:           97/71 mmHg Patient Gender: M               HR:           103 bpm. Exam Location:  Inpatient Procedure: 2D Echo, Cardiac Doppler and Color Doppler Indications:    Stroke  History:        Patient has no prior history of Echocardiogram examinations.                 Seizure and Stroke.  Sonographer:    Vanetta Shawl Referring Phys: [id number] [facility] KHALIQDINA IMPRESSIONS  1. Left ventricular ejection fraction, by estimation, is 60 to 65%. The left ventricle has normal function. The left ventricle has no regional wall motion abnormalities. Left ventricular diastolic parameters were normal.  2. Right ventricular systolic function is normal. The right ventricular size is normal.  3. The mitral valve is normal in structure. No evidence of mitral valve regurgitation. No evidence of mitral stenosis.  4. The aortic valve is normal in structure. Aortic valve regurgitation  is not visualized. No aortic stenosis is present.  5. The inferior vena cava is normal in size with greater than 50% respiratory variability, suggesting right atrial pressure of 3 mmHg. Conclusion(s)/Recommendation(s): No intracardiac source of embolism detected on this transthoracic study. A transesophageal echocardiogram is recommended to exclude cardiac source of embolism if clinically indicated. FINDINGS  Left Ventricle: Left ventricular ejection fraction, by estimation, is 60 to 65%. The left ventricle has normal function. The left ventricle has no regional wall motion abnormalities. The left ventricular internal cavity size was normal in size. There is  no left ventricular hypertrophy. Left ventricular diastolic parameters were normal. Right Ventricle: The right ventricular size is normal. No increase in right ventricular wall thickness. Right ventricular systolic function is normal. Left Atrium: Left atrial size was normal in size. Right Atrium: Right atrial size was normal in size. Pericardium: There is no evidence of pericardial effusion. Mitral Valve: The mitral valve is normal in structure. No evidence of mitral valve regurgitation. No evidence of mitral valve stenosis. Tricuspid Valve: The tricuspid valve is normal in structure. Tricuspid valve regurgitation is not demonstrated. No evidence of tricuspid stenosis. Aortic Valve: The aortic valve is normal in structure. Aortic valve regurgitation is not visualized. No aortic stenosis is present. Aortic valve mean gradient measures 4.0 mmHg. Aortic valve peak gradient measures 8.1 mmHg. Aortic valve area, by VTI measures 2.09 cm. Pulmonic Valve: The pulmonic valve was normal in structure. Pulmonic valve regurgitation is not visualized. No evidence of pulmonic stenosis. Aorta: The aortic root is normal in size and structure. Venous: The inferior vena cava is normal in size with greater than 50% respiratory variability, suggesting right atrial pressure of 3  mmHg. IAS/Shunts: No atrial level shunt detected by color flow Doppler.  LEFT  VENTRICLE PLAX 2D LVIDd:         4.70 cm   Diastology LVIDs:         2.90 cm   LV e' medial: 10.60 cm/s LV PW:         1.00 cm LV IVS:        1.00 cm LVOT diam:     1.90 cm LV SV:         46 LV SV Index:   24 LVOT Area:     2.84 cm  RIGHT VENTRICLE RV S prime:     20.30 cm/s LEFT ATRIUM             Index LA diam:        2.90 cm 1.49 cm/m LA Vol (A2C):   40.5 ml 20.75 ml/m LA Vol (A4C):   37.2 ml 19.06 ml/m LA Biplane Vol: 40.9 ml 20.96 ml/m  AORTIC VALVE                    PULMONIC VALVE AV Area (Vmax):    2.04 cm     PV Vmax:       1.37 m/s AV Area (Vmean):   2.18 cm     PV Peak grad:  7.5 mmHg AV Area (VTI):     2.09 cm AV Vmax:           142.00 cm/s AV Vmean:          92.800 cm/s AV VTI:            0.220 m AV Peak Grad:      8.1 mmHg AV Mean Grad:      4.0 mmHg LVOT Vmax:         102.00 cm/s LVOT Vmean:        71.300 cm/s LVOT VTI:          0.162 m LVOT/AV VTI ratio: 0.74  AORTA Ao Root diam: 2.90 cm Ao Asc diam:  2.60 cm  SHUNTS Systemic VTI:  0.16 m Systemic Diam: 1.90 cm Rachelle Hora Croitoru MD Electronically signed by Thurmon Fair MD Signature Date/Time: 07/27/2021/10:30:31 AM    Final    CT VENOGRAM HEAD  Result Date: 07/27/2021 CLINICAL DATA:  Initial evaluation for acute altered mental status, evaluate for basilar thrombus. EXAM: CT ANGIOGRAPHY HEAD AND NECK WITH CONTRAST CT VENOGRAM HEAD WITH CONTRAST TECHNIQUE: Multidetector CT imaging of the head and neck was performed using the standard protocol during bolus administration of intravenous contrast. Multiplanar CT image reconstructions and MIPs were obtained to evaluate the vascular anatomy. Carotid stenosis measurements (when applicable) are obtained utilizing NASCET criteria, using the distal internal carotid diameter as the denominator. CONTRAST:  60mL OMNIPAQUE IOHEXOL 350 MG/ML SOLN COMPARISON:  Prior noncontrast head CT from 07/26/2021./ FINDINGS: CTA NECK FINDINGS  Aortic arch: Visualized aortic arch normal in caliber with normal branch pattern. No stenosis or other abnormality about the origin of the great vessels. Right carotid system: Right common and internal carotid arteries widely patent without stenosis, dissection or occlusion. Left carotid system: Left common and internal carotid arteries widely patent without stenosis, dissection or occlusion. Vertebral arteries: Both vertebral arteries arise from the subclavian arteries. No proximal subclavian artery stenosis. Right vertebral artery widely patent within the neck without stenosis or other abnormality. On the left, there is intimal irregularity with intraluminal filling defect involving the proximal left V1 segment, concerning for acute dissection (series 7, image 130). Area of involvement begins at or just beyond the  origin, and extends approximately 2.3 cm in length. Associated moderate to severe stenosis proximally (series 5, image 271). Distally, the left vertebral artery is otherwise widely patent to the skull base. Skeleton: No visible acute osseous finding. No discrete or worrisome osseous lesions. Other neck: No other acute soft tissue abnormality within the neck. Endotracheal and enteric tubes in place. Upper chest: Mild atelectatic changes noted dependently within the visualized lungs. Visualized upper chest demonstrates no other acute finding. Review of the MIP images confirms the above findings CTA HEAD FINDINGS Anterior circulation: Both internal carotid arteries widely patent to the termini without stenosis. A1 segments widely patent. Normal anterior communicating artery complex. Both anterior cerebral arteries widely patent to their distal aspects without stenosis. No M1 stenosis or occlusion. Normal MCA bifurcations. Distal MCA branches well perfused and symmetric. Posterior circulation: Both V4 segments patent to the vertebrobasilar junction without stenosis or other abnormality. Both PICA origins  patent and normal. Basilar widely patent proximally. There is some a subtle irregular filling defect involving the distal basilar artery/basilar tip, concerning for intraluminal thrombus (series 7, image 128). Secondary mild to moderate narrowing at the basilar tip. Neither superior cerebellar artery well visualized at the origin., and could be occluded. Irregular partially occlusive filling defect seen involving the left P1 segment with resultant moderate to severe stenosis (series 7, image 129). Left PCA otherwise widely patent distally. On the right, there is acute occlusion at the distal right P2 segment (series 7, image 141). Perfusion within right PCA branches distally could be related to subocclusive thrombus and/or collateralization. Venous sinuses: Dedicated venogram images were performed. Normal enhancement seen throughout the superior sagittal sinus to the level of the torcula. Transverse and sigmoid sinuses are patent bilaterally. Proximal internal jugular veins patent. Straight sinus, vein of Galen, internal cerebral veins, and basal veins of Rosenthal are patent. No visible abnormality about the cavernous sinus. Superior orbital veins grossly symmetric and within normal limits. No evidence for dural sinus thrombosis. No appreciable cortical venous thrombosis. Anatomic variants: None significant.  No aneurysm. Review of the MIP images confirms the above findings IMPRESSION: 1. 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 2. Subtle irregular nonocclusive filling defect involving the distal basilar artery/basilar tip, consistent with associated distal embolic disease. Adjacent superior cerebellar arteries are not visualized at their origins, and could be occluded. 3. Associated partially occlusive thrombus with severe stenosis involving the left P1 segment. 4. Additional distal right P2 embolic occlusion. 5.  Wide patency of both carotid artery systems and anterior circulation. 6. Negative CT venogram.  No evidence for dural sinus thrombosis. Critical Value/emergent results were called by telephone at the time of interpretation on 07/27/2021 at 1:26 am to provider [facility] , who verbally acknowledged these results. Electronically Signed   By: Rise Mu M.D.   On: 07/27/2021 02:08   IR PERCUTANEOUS ART THROMBECTOMY/INFUSION INTRACRANIAL INC DIAG ANGIO  Result Date: 07/28/2021 INDICATION: Onset of seizures, decreased level of consciousness. 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 EMERGENT LARGE VESSEL OCCLUSION THROMBOLYSIS (POSTERIOR CIRCULATION) COMPARISON:  CT angiogram of the head and neck July 27, 2021. MEDICATIONS: Ancef 2 g IV antibiotic was administered within 1 hour of the procedure. ANESTHESIA/SEDATION: General anesthesia. CONTRAST:  Omnipaque 300 proximally 130 mL. FLUOROSCOPY TIME:  Fluoroscopy Time: 72 minutes  42 seconds (4188 mGy). COMPLICATIONS: None immediate. TECHNIQUE: Following a full explanation of the procedure along with the potential associated complications, an informed witnessed consent was obtained. The risks of intracranial hemorrhage of 10%, worsening neurological deficit, ventilator dependency, death and inability to revascularize were all reviewed in detail with the patient's sister. The patient was then put under general anesthesia by the Department of Anesthesiology at [facility]. The right groin was prepped and draped in the usual sterile fashion. Thereafter using modified Seldinger technique, transfemoral access into the right common femoral artery was obtained without difficulty. Over a 0.035 inch guidewire an 8 [place] Pinnacle 25 cm sheath was inserted. Through this, and  also over a 0.035 inch guidewire a 5 [place] JB 1 catheter was advanced to the aortic arch region and selectively positioned in the right common carotid artery, the right subclavian artery, the left vertebral artery. FINDINGS: The right common carotid arteriogram demonstrates the right external carotid artery and its major branches to be widely patent. The right internal carotid artery at the bulb to the cranial skull base is widely patent. There is a mild to moderate stenosis at the cervical petrous junction. More distally, the petrous, the cavernous and the supraclinoid segments are widely patent. The right middle cerebral artery and the right anterior cerebral artery opacify into the capillary and venous phases. Transient cross-filling via the anterior communicating artery of the left anterior cerebral A2 segment and distally is seen. The innominate arteriogram demonstrates the right common carotid artery proximally, and the right subclavian artery proximally to be widely patent. The right vertebral artery is seen to be patent with opacification made to the cranial skull base. The left vertebral artery arteriogram demonstrates abnormal appearance in the proximal 1/3 starting at the origin. There is a tapered irregularity associated with severe stenosis and a prominent smooth filling defect. More distally, the vessel opacifies normally to the cranial skull base. Patency is seen of the left vertebrobasilar junction and the left posterior-inferior cerebellar artery. Prominent filling defects are seen in the distal basilar artery, and the left posterior cerebral artery at its origin, and the right posterior cerebral artery in the distal P1 segment. PROCEDURE: The diagnostic JB 1 catheter in the distal right subclavian artery was then exchanged over a 0.035 inch 300 cm Rosen exchange guidewire for a 90 cm 8 [place] Neuron Max sheath, which was advanced to the distal right subclavian artery. The guidewire was removed.  Good aspiration obtained from the hub of the Neuron Max sheath. A control arteriogram performed through the Neuron Max sheath after it had been withdrawn just proximal to the origin of the right vertebral artery demonstrates no evidence of spasms, dissections or of intraluminal filling defects. Over a 0.035 Roadrunner guidewire, a diagnostic JB 1 catheter was advanced within the Neuron Max sheath to its distal end. The vertebral artery was then selected with the guidewire followed by the 5 French diagnostic catheter which, the 8 [place] Neuron Max sheath was advanced to the distal cervical right ICA. The guidewire, and the support catheter were removed. Good aspiration obtained from the hub of the Neuron Max sheath. A gentle control arteriogram performed through this demonstrated no evidence of spasms, dissections or of intraluminal filling defects. The opacified right vertebral artery distally, the right vertebrobasilar junction and the right posterior-inferior cerebellar artery demonstrate wide patency. Cross-filling of the left vertebrobasilar junction was noted. The basilar artery and its proximal 2/3, the anterior-inferior cerebellar arteries demonstrate uniform enhancement. However, prominent pre occlusive  filling defects noted in the distal 1/3 of the basilar artery extending into the right posterior cerebral artery distal P1 segment, and the origin of the left posterior cerebral artery. ENDOVASCULAR REVASCULARIZATION OF DISTAL BASILAR ARTERY THROMBOSIS, AND OF THE POSTERIOR CEREBRAL ARTERIES AND OF THE SYMPTOMATIC DISSECTED PROXIMAL LEFT VERTEBRAL ARTERY EXTENDING TO THE ORIGIN. Over a 0.014 inch standard Synchro micro guidewire with a moderate J configuration, the combination of a 162 cm 021 microcatheter inside of an 071 132 cm Zoom aspiration catheter combination was advanced to the right vertebrobasilar junction. The micro guidewire was then gently advanced more distally into the distal basilar artery  initially and then into the left posterior cerebral artery distal P2 segment. The guidewire was removed. Good aspiration obtained from the hub of the microcatheter. Gentle control arteriogram performed through the microcatheter demonstrated safe positioning of the tip of the microcatheter. This was then connected to continuous heparinized saline infusion. At this time, a 4 mm x 40 mm Solitaire X retrieval device was advanced to the distal end of the microcatheter. The O ring on the delivery microcatheter was loosened. With slight forward gentle traction with the right hand on the delivery micro guidewire, with the left hand the delivery microcatheter was unsheathed deploying the retrieval device. The proximal portion of the device was seen in the distal basilar artery. Thereafter, with constant aspiration with a Penumbra aspiration device at the hub of the 071 Zoom aspiration catheter and a 60 mL syringe at the hub of the Neuron Max sheath for approximately 3-1/2 minutes, the combination of the retrieval device, the 071 Zoom aspiration catheter, and the microcatheter were retrieved and removed. A control arteriogram performed through the Neuron Max sheath in the distal right vertebral artery demonstrated near complete revascularization of the distal basilar artery and the posterior cerebral arteries. A small filling defect was noted in the distal left superior cerebellar artery. This was deemed too distal and small for endovascular retrieval. A TICI 2C reperfusion was achieved. The sheath was then retrieved and positioned at the origin of the left vertebral artery. A control arteriogram performed through the Neuron Max sheath just proximal to the origin of the left vertebral artery continued to demonstrate sequela of a long segment dissection associated with an intimal flap, and a mural hematoma. Associated significant narrowing was noted. However, more distally, the contrast was seen to the cranial skull base into  the left vertebrobasilar junction and the left posterior-inferior cerebellar artery. 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 A 2.4 mm x 5 mm Emboshield device was then prepped and purged of air in its housing. The device was then retrieved into the delivery microcatheter. The combination of the 014 inch and the delivery microcatheter was retrieved from the housing. A J configuration was given to the tip of the micro guidewire. Under constant roadmap technique and constant fluoroscopic guidance, the combination of the Emboshield delivery catheter, and the micro guidewire was advanced to just proximal to the origin of the left vertebral artery. Using a torque device, access with the micro guidewire was first obtained without contact with the underlying dissected segment. The combination was then advanced without difficulty to the cranial skull base to the level of C1-C2. In the usual manner, the filter device was then deployed. The delivery microcatheter was retrieved and removed while maintaining constant positioning of tip of the micro guidewire. Measurements were then performed of the proximal left vertebral artery in its most normal segment. The left dissection was also  measured. It was decided to proceed with placement of a 3.5 mm x 34 mm Onyx Frontier balloon mountable stent. This was prepped with 75% contrast and 25% heparinized saline infusion antegradely, and with just heparinized saline infusion retrogradely. Thereafter using the rapid exchange technique, the combination was advanced without difficulty to the proximal left vertebral artery. The proximal and the distal landing zones were then identified. The stent was then deployed by inflating the balloon mountable stent using micro inflation syringe device via micro tubing to just over 3 mm where it was maintained for approximately 15 seconds. Thereafter, the balloon was deflated and removed. A control arteriogram performed through Neuron  Max sheath just proximal to the origin of the left vertebral artery now demonstrated excellent apposition of the stent with wide patency of the left vertebral artery proximally. More distally, this contrast flow was noted into the left vertebrobasilar junction, the basilar artery and the just revascularized posterior cerebral arteries. No evidence of intraluminal filling defects was seen extra cranially or intracranially. The filter capture device was then advanced again using the rapid exchange technique to just the proximal marker on the device. The filter device was then captured into the capture device by retrieving on the micro guidewire. The combination was then retrieved slowly under constant fluoroscopic guidance ensuring no entanglement with the planted stent in the proximal left vertebral artery. Control arteriogram was then performed at 10 and 20 minutes post deployment of the stent. These continued to demonstrate excellent flow in the proximal left vertebral artery. Intracranially the TICI 2C revascularization was maintained. Following this, the Neuron Max sheath was then retrieved and removed. The 8 French sheath was removed with hemostasis achieved at the right groin puncture site with a 6 French Angio-Seal closure device. Distal pulses remained palpable in both feet unchanged. A CT of the brain obtained demonstrated no evidence of intracranial hemorrhage or hydrocephalus. Prior to planting the stent at the second site in the proximal left vertebral artery, patient was given 81 mg of aspirin, and 180 mg of Brilinta via an orogastric tube. Also right after stent placement, patient was given a bolus dose of cangrelor followed by a low-dose four hour infusion. Patient was left intubated on account of a pre procedural clinical condition. He was then transferred to the neuro ICU for post revascularization measures. IMPRESSION: Status post endovascular revascularization of occluded distal basilar artery and  both posterior cerebral arteries as described above with a 4 mm x 4 mm Solitaire X retrieval device and contact aspiration achieving a TICI score of 2C. 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 PLAN: Follow-up in the clinic 2 weeks post discharge. Electronically Signed   By: Julieanne Cotton M.D.   On: 07/28/2021 08:14   IR ANGIO EXTRACRAN SEL COM CAROTID INNOMINATE UNI R MOD SED  Result Date: 07/28/2021 INDICATION: Onset of seizures, decreased level of consciousness. 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 EMERGENT LARGE VESSEL OCCLUSION THROMBOLYSIS (POSTERIOR CIRCULATION) COMPARISON:  CT angiogram of the head and neck July 27, 2021. MEDICATIONS: Ancef 2 g IV antibiotic was administered within 1 hour of the procedure. ANESTHESIA/SEDATION: General anesthesia. CONTRAST:  Omnipaque 300 proximally 130 mL. FLUOROSCOPY TIME:  Fluoroscopy Time: 72 minutes 42 seconds (4188 mGy). COMPLICATIONS: None immediate. TECHNIQUE: Following a  full explanation of the procedure along with the potential associated complications, an informed witnessed consent was obtained. The risks of intracranial hemorrhage of 10%, worsening neurological deficit, ventilator dependency, death and inability to revascularize were all reviewed in detail with the patient's sister. The patient was then put under general anesthesia by the Department of Anesthesiology at [facility]. The right groin was prepped and draped in the usual sterile fashion. Thereafter using modified Seldinger technique, transfemoral access into the right common femoral artery was obtained without difficulty. Over a 0.035 inch guidewire an 8 [place] Pinnacle  25 cm sheath was inserted. Through this, and also over a 0.035 inch guidewire a 5 [place] JB 1 catheter was advanced to the aortic arch region and selectively positioned in the right common carotid artery, the right subclavian artery, the left vertebral artery. FINDINGS: The right common carotid arteriogram demonstrates the right external carotid artery and its major branches to be widely patent. The right internal carotid artery at the bulb to the cranial skull base is widely patent. There is a mild to moderate stenosis at the cervical petrous junction. More distally, the petrous, the cavernous and the supraclinoid segments are widely patent. The right middle cerebral artery and the right anterior cerebral artery opacify into the capillary and venous phases. Transient cross-filling via the anterior communicating artery of the left anterior cerebral A2 segment and distally is seen. The innominate arteriogram demonstrates the right common carotid artery proximally, and the right subclavian artery proximally to be widely patent. The right vertebral artery is seen to be patent with opacification made to the cranial skull base. The left vertebral artery arteriogram demonstrates abnormal appearance in the proximal 1/3 starting at the origin. There is a tapered irregularity associated with severe stenosis and a prominent smooth filling defect. More distally, the vessel opacifies normally to the cranial skull base. Patency is seen of the left vertebrobasilar junction and the left posterior-inferior cerebellar artery. Prominent filling defects are seen in the distal basilar artery, and the left posterior cerebral artery at its origin, and the right posterior cerebral artery in the distal P1 segment. PROCEDURE: The diagnostic JB 1 catheter in the distal right subclavian artery was then exchanged over a 0.035 inch 300 cm Rosen exchange guidewire for a 90 cm 8 [place] Neuron Max sheath, which was advanced to the distal right  subclavian artery. The guidewire was removed. Good aspiration obtained from the hub of the Neuron Max sheath. A control arteriogram performed through the Neuron Max sheath after it had been withdrawn just proximal to the origin of the right vertebral artery demonstrates no evidence of spasms, dissections or of intraluminal filling defects. Over a 0.035 Roadrunner guidewire, a diagnostic JB 1 catheter was advanced within the Neuron Max sheath to its distal end. The vertebral artery was then selected with the guidewire followed by the 5 French diagnostic catheter which, the 8 [place] Neuron Max sheath was advanced to the distal cervical right ICA. The guidewire, and the support catheter were removed. Good aspiration obtained from the hub of the Neuron Max sheath. A gentle control arteriogram performed through this demonstrated no evidence of spasms, dissections or of intraluminal filling defects. The opacified right vertebral artery distally, the right vertebrobasilar junction and the right posterior-inferior cerebellar artery demonstrate wide patency. Cross-filling of the left vertebrobasilar junction was noted. The basilar artery and its proximal 2/3, the anterior-inferior cerebellar arteries demonstrate uniform enhancement. However, prominent pre occlusive filling defects noted in the distal 1/3 of the basilar  artery extending into the right posterior cerebral artery distal P1 segment, and the origin of the left posterior cerebral artery. ENDOVASCULAR REVASCULARIZATION OF DISTAL BASILAR ARTERY THROMBOSIS, AND OF THE POSTERIOR CEREBRAL ARTERIES AND OF THE SYMPTOMATIC DISSECTED PROXIMAL LEFT VERTEBRAL ARTERY EXTENDING TO THE ORIGIN. Over a 0.014 inch standard Synchro micro guidewire with a moderate J configuration, the combination of a 162 cm 021 microcatheter inside of an 071 132 cm Zoom aspiration catheter combination was advanced to the right vertebrobasilar junction. The micro guidewire was then gently advanced  more distally into the distal basilar artery initially and then into the left posterior cerebral artery distal P2 segment. The guidewire was removed. Good aspiration obtained from the hub of the microcatheter. Gentle control arteriogram performed through the microcatheter demonstrated safe positioning of the tip of the microcatheter. This was then connected to continuous heparinized saline infusion. At this time, a 4 mm x 40 mm Solitaire X retrieval device was advanced to the distal end of the microcatheter. The O ring on the delivery microcatheter was loosened. With slight forward gentle traction with the right hand on the delivery micro guidewire, with the left hand the delivery microcatheter was unsheathed deploying the retrieval device. The proximal portion of the device was seen in the distal basilar artery. Thereafter, with constant aspiration with a Penumbra aspiration device at the hub of the 071 Zoom aspiration catheter and a 60 mL syringe at the hub of the Neuron Max sheath for approximately 3-1/2 minutes, the combination of the retrieval device, the 071 Zoom aspiration catheter, and the microcatheter were retrieved and removed. A control arteriogram performed through the Neuron Max sheath in the distal right vertebral artery demonstrated near complete revascularization of the distal basilar artery and the posterior cerebral arteries. A small filling defect was noted in the distal left superior cerebellar artery. This was deemed too distal and small for endovascular retrieval. A TICI 2C reperfusion was achieved. The sheath was then retrieved and positioned at the origin of the left vertebral artery. A control arteriogram performed through the Neuron Max sheath just proximal to the origin of the left vertebral artery continued to demonstrate sequela of a long segment dissection associated with an intimal flap, and a mural hematoma. Associated significant narrowing was noted. However, more distally, the  contrast was seen to the cranial skull base into the left vertebrobasilar junction and the left posterior-inferior cerebellar artery. 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 A 2.4 mm x 5 mm Emboshield device was then prepped and purged of air in its housing. The device was then retrieved into the delivery microcatheter. The combination of the 014 inch and the delivery microcatheter was retrieved from the housing. A J configuration was given to the tip of the micro guidewire. Under constant roadmap technique and constant fluoroscopic guidance, the combination of the Emboshield delivery catheter, and the micro guidewire was advanced to just proximal to the origin of the left vertebral artery. Using a torque device, access with the micro guidewire was first obtained without contact with the underlying dissected segment. The combination was then advanced without difficulty to the cranial skull base to the level of C1-C2. In the usual manner, the filter device was then deployed. The delivery microcatheter was retrieved and removed while maintaining constant positioning of tip of the micro guidewire. Measurements were then performed of the proximal left vertebral artery in its most normal segment. The left dissection was also measured. It was decided to proceed with placement of a  3.5 mm x 34 mm Onyx Frontier balloon mountable stent. This was prepped with 75% contrast and 25% heparinized saline infusion antegradely, and with just heparinized saline infusion retrogradely. Thereafter using the rapid exchange technique, the combination was advanced without difficulty to the proximal left vertebral artery. The proximal and the distal landing zones were then identified. The stent was then deployed by inflating the balloon mountable stent using micro inflation syringe device via micro tubing to just over 3 mm where it was maintained for approximately 15 seconds. Thereafter, the balloon was deflated and  removed. A control arteriogram performed through Neuron Max sheath just proximal to the origin of the left vertebral artery now demonstrated excellent apposition of the stent with wide patency of the left vertebral artery proximally. More distally, this contrast flow was noted into the left vertebrobasilar junction, the basilar artery and the just revascularized posterior cerebral arteries. No evidence of intraluminal filling defects was seen extra cranially or intracranially. The filter capture device was then advanced again using the rapid exchange technique to just the proximal marker on the device. The filter device was then captured into the capture device by retrieving on the micro guidewire. The combination was then retrieved slowly under constant fluoroscopic guidance ensuring no entanglement with the planted stent in the proximal left vertebral artery. Control arteriogram was then performed at 10 and 20 minutes post deployment of the stent. These continued to demonstrate excellent flow in the proximal left vertebral artery. Intracranially the TICI 2C revascularization was maintained. Following this, the Neuron Max sheath was then retrieved and removed. The 8 French sheath was removed with hemostasis achieved at the right groin puncture site with a 6 French Angio-Seal closure device. Distal pulses remained palpable in both feet unchanged. A CT of the brain obtained demonstrated no evidence of intracranial hemorrhage or hydrocephalus. Prior to planting the stent at the second site in the proximal left vertebral artery, patient was given 81 mg of aspirin, and 180 mg of Brilinta via an orogastric tube. Also right after stent placement, patient was given a bolus dose of cangrelor followed by a low-dose four hour infusion. Patient was left intubated on account of a pre procedural clinical condition. He was then transferred to the neuro ICU for post revascularization measures. IMPRESSION: Status post endovascular  revascularization of occluded distal basilar artery and both posterior cerebral arteries as described above with a 4 mm x 4 mm Solitaire X retrieval device and contact aspiration achieving a TICI score of 2C. 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 PLAN: Follow-up in the clinic 2 weeks post discharge. Electronically Signed   By: Julieanne Cotton M.D.   On: 07/28/2021 08:14    Labs:  CBC: Recent Labs    07/27/21 0031 07/27/21 0046 07/27/21 0500 07/27/21 1223 07/28/21 0519  WBC 19.0*  --  13.9*  --  11.6*  HGB 13.2 13.6 11.9* 11.9* 11.5*  HCT 39.9 40.0 34.6* 35.0* 34.1*  PLT 179  --  319  --  313    COAGS: No results for input(s): INR, APTT in the last 8760 hours.  BMP: Recent Labs    07/27/21 0031 07/27/21 0046 07/27/21 0500 07/27/21 1223 07/28/21 0519  NA 139 140 138 140 137  K 3.0* 2.9* 3.8 3.7 3.2*  CL 107 110 109  --  106  CO2 18*  --  20*  --  23  GLUCOSE 112* 115* 103*  --  96  BUN 11 11 9   --  7  CALCIUM 8.5*  --  8.1*  --  8.3*  CREATININE 1.13 1.00 1.00  --  0.87  GFRNONAA >60  --  >60  --  >60    LIVER FUNCTION TESTS: Recent Labs    07/27/21 0031  BILITOT 0.6  AST 38  ALT 38  ALKPHOS 59  PROT 6.7  ALBUMIN 3.6    Assessment and Plan:  Pt resting quietly in bed. His speech is slurred. Voice is hoarse from recent intubation, extubated prior to this exam.  Dr. 13/08/22 performs bedside neuro assessment.  He is able to answer simple yes/no or one word response questions.  He is in no distress. VSS  Continued plan of care per critical care/neuro/NIR  Contact NIR for questions or concerns.    Electronically Signed: Corliss Skains, NP 07/28/2021, 2:15 PM   I spent a total of 25 Minutes at the the patient's bedside AND on the patient's hospital floor or unit, greater than 50% of which was counseling/coordinating care  for S/p left vertebral artery angioplasty and stenting followed with successful mechanical thrombectomy and revascularization.

## 2021-07-28 NOTE — Progress Notes (Signed)
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   Clinical Impression   PT admitted with revascularization of occluded basal artery and PCA. Pt currently with functional limitiations due to the deficits listed below (see OT problem list). Pt currently with ataxic movement of all extremities and expressive language deficits. Question if pt has baseline cognitive deficits prior to admission (not noted in chart and no family present to obtain any past medical hx related to educational level) Pt completed eob sit<>stand only due to SBP 141 Pt will benefit from skilled OT to increase their independence and safety with adls and balance to allow discharge CIR.  >call me Roy [name]       Recommendations for follow up therapy are one component of a multi-disciplinary discharge planning process, led by the attending physician.  Recommendations may be updated based on patient status, additional functional criteria and insurance authorization.   Follow Up Recommendations  Acute inpatient rehab (3hours/day)    Assistance Recommended at Discharge Intermittent Supervision/Assistance  Functional Status Assessment  Patient has had a recent decline in their functional status and demonstrates the ability to make significant improvements in function in a reasonable and predictable amount of time.  Equipment Recommendations  BSC/3in1;Wheelchair (measurements OT);Wheelchair cushion (measurements OT)    Recommendations for Other Services Rehab consult     Precautions / Restrictions Precautions Precautions: Fall Precaution Comments: BP <140      Mobility Bed Mobility Overal bed mobility: Needs  Assistance Bed Mobility: Supine to Sit;Sit to Supine     Supine to sit: +2 for physical assistance;Mod assist;+2 for safety/equipment;HOB elevated Sit to supine: +2 for physical assistance;Total assist;+2 for safety/equipment   General bed mobility comments: pt needs (A) to progress bil LE and core toward EOB. pt once on R side able to push up into sitting min (A). pt sitting min - mod (A) initially. pt unable to touch floor with bil LE due to height. pt requires total +2 total (A) to return to supine    Transfers Overall transfer level: Needs assistance Equipment used: 2 person hand held assist Transfers: Sit to/from Stand Sit to Stand: +2 safety/equipment;Max assist;+2 physical assistance;From elevated surface           General transfer comment: pt unable to sustain static standing and requires blocking bil LE      Balance Overall balance assessment: Needs assistance Sitting-balance support: Bilateral upper extremity supported;Feet supported Sitting balance-Leahy Scale: Poor     Standing balance support: Bilateral upper extremity supported;During functional activity Standing balance-Leahy Scale: Zero                             ADL either performed or assessed with clinical judgement   ADL Overall ADL's : Needs assistance/impaired Eating/Feeding: NPO   Grooming: Moderate assistance;Sitting Grooming Details (indicate cue type and reason): unable to sustain grasp on cloth Upper Body Bathing: Moderate assistance   Lower Body Bathing: Maximal assistance   Upper Body Dressing : Moderate assistance   Lower Body Dressing: Maximal assistance                       Vision Patient Visual Report: Diplopia Vision Assessment?:  Yes Eye Alignment: Impaired (comment) Ocular Range of Motion: Within Functional Limits Additional Comments: pt reports seeing double and monocular with occlusion. pt inconsistent with responses and will need further workup next  session to determine occlusion needs     Perception     Praxis      Pertinent Vitals/Pain Pain Assessment: Faces Faces Pain Scale: Hurts a little bit Pain Location: generalized Pain Descriptors / Indicators: Discomfort Pain Intervention(s): Limited activity within patient's tolerance;Monitored during session     Hand Dominance Right   Extremity/Trunk Assessment Upper Extremity Assessment Upper Extremity Assessment: Generalized weakness;Difficult to assess due to impaired cognition   Lower Extremity Assessment Lower Extremity Assessment: Defer to PT evaluation   Cervical / Trunk Assessment Cervical / Trunk Assessment: Normal   Communication Communication Communication: Expressive difficulties   Cognition Arousal/Alertness: Awake/alert Behavior During Therapy: Flat affect Overall Cognitive Status: Impaired/Different from baseline Area of Impairment: Orientation;Attention;Safety/judgement;Following commands;Awareness;Problem solving                 Orientation Level: Disoriented to;Place (said we are in the shed) Current Attention Level: Sustained   Following Commands: Follows one step commands inconsistently Safety/Judgement: Decreased awareness of safety;Decreased awareness of deficits Awareness: Intellectual Problem Solving: Slow processing General Comments: pt reports being at sister house in the shed. pt liable and needing cues to calm down after inability to don socks. pt states "i cant do it" pt needs cues to prevent hyperventation     General Comments  SBP low 140s and therapist monitoring closely. pt with Roy [name] 3L HR 120s    Exercises     Shoulder Instructions      Home Living Family/patient expects to be discharged to:: Private residence                                 Additional Comments: lives at sister house and working at [employer] but due to expressive deficits unable to fully obtain PTA living arrangements. pt becoming liable       Prior Functioning/Environment Prior Level of Function : Working/employed                        OT Problem List: Decreased strength;Decreased activity tolerance;Impaired balance (sitting and/or standing)      OT Treatment/Interventions: Self-care/ADL training;Therapeutic exercise;Energy conservation;DME and/or AE instruction    OT Goals(Current goals can be found in the care plan section) Acute Rehab OT Goals Patient Stated Goal: none stated OT Goal Formulation: Patient unable to participate in goal setting Time For Goal Achievement: 08/11/21 Potential to Achieve Goals: Good  OT Frequency: Min 2X/week   Barriers to D/C:            Co-evaluation PT/OT/SLP Co-Evaluation/Treatment: Yes Reason for Co-Treatment: To address functional/ADL transfers;For patient/therapist safety;Necessary to address cognition/behavior during functional activity;Complexity of the patient's impairments (multi-system involvement)   OT goals addressed during session: ADL's and self-care;Proper use of Adaptive equipment and DME;Strengthening/ROM      AM-PAC OT "6 Clicks" Daily Activity     Outcome Measure Help from another person eating meals?: A Lot Help from another person taking care of personal grooming?: A Lot Help from another person toileting, which includes using toliet, bedpan, or urinal?: A Lot Help from another person bathing (including washing, rinsing, drying)?: A Lot Help from another person to put on and taking off regular upper body clothing?: A Lot Help from another person to put  on and taking off regular lower body clothing?: A Lot 6 Click Score: 12   End of Session Equipment Utilized During Treatment: Gait belt;Oxygen Nurse Communication: Mobility status;Precautions  Activity Tolerance: Patient tolerated treatment well Patient left: in bed;with call bell/phone within reach;with bed alarm set;with SCD's reapplied  OT Visit Diagnosis: Unsteadiness on feet (R26.81);Muscle  weakness (generalized) (M62.81);Cognitive communication deficit (R41.841) Symptoms and signs involving cognitive functions: Cerebral infarction                Time:  -    Charges:  OT General Charges $OT Visit: 1 Visit OT Evaluation $OT Eval Moderate Complexity: 1 Mod   Brynn, OTR/L  Acute Rehabilitation Services Pager: [phone] Office: [phone] .   Mateo Flow 07/28/2021, 3:02 PM

## 2021-07-28 NOTE — Progress Notes (Signed)
ART line had no waveform. Checked on patient and ART line had mostly fallen out. Line removed, no bleeding. Dressing placed.Stroke team and IR team notified.  Sherral Hammers RN

## 2021-07-28 NOTE — Progress Notes (Signed)
In theSTROKE TEAM PROGRESS NOTE   INTERVAL HISTORY Patient was extubated this morning.  He is awake alert interactive.  He is dysarthric.  He has right gaze palsy and right facial weakness and right hemiparesis.  Vital signs are stable.  MRI scan of the brain shows multifocal posterior circulation acute infarcts involving right thalamus, right medial occipital and temporal lobes, hippocampus, centrally in the midbrain and left pons and bilateral superior cerebellar hemispheres.  His grandmother and aunt arrived after rounds Vitals:   07/28/21 1000 07/28/21 1100 07/28/21 1200 07/28/21 1300  BP: 140/78 128/68 140/80 138/77  Pulse: (!) 114 (!) 104 (!) 107 85  Resp: (!) 24 (!) 26 (!) 24 (!) 30  Temp:   98.2 F (36.8 C)   TempSrc:   Oral   SpO2: 95% 100% 100% 100%  Weight:      Height:       CBC:  Recent Labs  Lab 07/27/21 0031 07/27/21 0046 07/27/21 0500 07/27/21 1223 07/28/21 0519  WBC 19.0*  --  13.9*  --  11.6*  NEUTROABS 15.0*  --   --   --  8.4*  HGB 13.2   < > 11.9* 11.9* 11.5*  HCT 39.9   < > 34.6* 35.0* 34.1*  MCV 91.9  --  89.4  --  91.4  PLT 179  --  319  --  313   < > = values in this interval not displayed.   Basic Metabolic Panel:  Recent Labs  Lab 07/27/21 0031 07/27/21 0046 07/27/21 0500 07/27/21 1223 07/28/21 0519  NA 139   < > 138 140 137  K 3.0*   < > 3.8 3.7 3.2*  CL 107   < > 109  --  106  CO2 18*  --  20*  --  23  GLUCOSE 112*   < > 103*  --  96  BUN 11   < > 9  --  7  CREATININE 1.13   < > 1.00  --  0.87  CALCIUM 8.5*  --  8.1*  --  8.3*  MG 1.7  --  1.9  --   --   PHOS  --   --  4.1  --   --    < > = values in this interval not displayed.    Lipid Panel:  Recent Labs  Lab 07/28/21 0519  CHOL 199  TRIG 230*  HDL 37*  CHOLHDL 5.4  VLDL 46*  LDLCALC 116*     PRXY5O:  Recent Labs  Lab 07/27/21 0500  HGBA1C 5.7*   Urine Drug Screen:  Recent Labs  Lab 07/27/21 0606  LABOPIA NONE DETECTED  COCAINSCRNUR NONE DETECTED  LABBENZ NONE  DETECTED  AMPHETMU NONE DETECTED  THCU POSITIVE*  LABBARB NONE DETECTED    Alcohol Level  Recent Labs  Lab 07/27/21 0031  ETH <10    IMAGING past 24 hours DG Pelvis 1-2 Views  Result Date: 07/27/2021 CLINICAL DATA:  Stroke EXAM: PELVIS - 1-2 VIEW COMPARISON:  CT angiography head 07/27/2021 FINDINGS: Approximally 17 cm oval opacity overlying the pelvis likely represents a contrast filled urinary bladder due to recent intravenous contrast administration. Markedly limited evaluation due to overlapping osseous structures and overlying soft tissues. There is no evidence of pelvic fracture or diastasis. No pelvic bone lesions are seen. IMPRESSION: Urinary bladder lumen filled with excreted intravenous contrast. Electronically Signed   By: Tish Frederickson M.D.   On: 07/27/2021 15:14   MR BRAIN WO  CONTRAST  Result Date: 07/27/2021 CLINICAL DATA:  Neuro deficit, acute, stroke suspected. Status post mechanical thrombectomy. EXAM: MRI HEAD WITHOUT CONTRAST TECHNIQUE: Multiplanar, multiecho pulse sequences of the brain and surrounding structures were obtained without intravenous contrast. COMPARISON:  Head CT July 27, 2021. FINDINGS: Brain: Scattered areas of restricted diffusion are seen involving the right thalamus, medial aspect of the right occipital and temporal lobes, including right hippocampus, centrally in the midbrain, on the left side of the pons and in the bilateral cerebellar hemispheres consistent with acute infarcts. Petechial hemorrhage seen in the left inferior cerebellar hemisphere and right occipital lobe. No hydrocephalus, extra-axial collection or mass lesion. Vascular: Normal flow voids. Skull and upper cervical spine: Normal marrow signal. Sinuses/Orbits: Mucosal thickening throughout the paranasal sinuses with fluid level within the sphenoid sinuses. The orbits are maintained. Other: Presence of endotracheal tube. IMPRESSION: Acute infarcts involving the right thalamus, medial  aspect of the right occipital and temporal lobes, including right hippocampus, centrally in the midbrain, on the left side of the pons and in the bilateral cerebellar hemispheres. Petechial hemorrhage seen in the left inferior cerebellar hemisphere right occipital lobe. No significant mass effect. Electronically Signed   By: Baldemar Lenis M.D.   On: 07/27/2021 16:40   Overnight EEG with video  Result Date: 07/28/2021 Charlsie Quest, MD     07/28/2021  9:16 AM Patient Name: Mykah Shin MRN: [id number] Epilepsy Attending: Charlsie Quest Referring Physician/Provider: Dr Erick Blinks Duration: 07/27/2021 1058 to 07/28/2021 0915  Patient history: 25 year old male with seizure-like episodes.  EEG to evaluate for seizure.  Level of alertness: Lethargic  AEDs during EEG study: Propofol  Technical aspects: This EEG study was done with scalp electrodes positioned according to the 10-20 International system of electrode placement. Electrical activity was acquired at a sampling rate of 500Hz  and reviewed with a high frequency filter of 70Hz  and a low frequency filter of 1Hz . EEG data were recorded continuously and digitally stored.  Description: EEG initially showed an excessive amount of 15 to 18 Hz beta activity  distributed symmetrically and diffusely. Gradually, EEG showed posterior dominant rhythm of 9 Hz activity of moderate voltage (25-35 uV) seen predominantly in posterior head regions, symmetric and reactive to eye opening and eye closing.   Hyperventilation and photic stimulation were not performed.    ABNORMALITY - Excessive beta, generalized  IMPRESSION: This study is within normal limits. No seizures or epileptiform discharges were seen throughout the recording.  The excessive beta activity seen in the background is most likely due to the effect of medications like benzodiazepine and is a benign EEG pattern.  Priyanka    PHYSICAL EXAM [name]  is not in distress.. . Afebrile. Head is nontraumatic. Neck is supple without bruit.    Cardiac exam no murmur or gallop. Lungs are clear to auscultation. Distal pulses are well felt. . Neurological Exam :    Mental status/Cognition: Awake alert.  Oriented to time place and person.  Follows commands well. Speech/language: Moderate dysarthria but can be understood.  Voice is slightly hypophonic.  Follows commands well. Cranial nerves/brainstem: Pupils 20mm, equal round and reactive to light.  Right gaze palsy with subjective diplopia.  Disconjugate horizontal gaze.  Preserved vertical gaze.  No nystagmus Moderate right lower facial weakness.  Tongue midline.   Motor:  Right hemiparesis with right upper extremity 3/5 strength with distal hand weakness.  Right lower extremity 2/5 strength.  Left upper extremity 4/5 strength with  good grip.  Left lower extremity 4/5 strength.  Tone is normal. Sensation:  Diminished touch pinprick sensation in the right face and upper and lower extremities.   Coordination/Complex Motor:  Impaired on the right proportionate to the weakness.  Normal on the left  ASSESSMENT/PLAN Mr. Treyton Slimp is a 25 y.o. male with  no significant PMH who presents with shaking and unresponsiveness. Patient is intubated and unable to provide any history. Most of the history obtained from sister and fiance over phone. Per fiance, he has been reporting that his head hurts for the last 3-4 days along with feeling cold. Today, they smoked marijuana and he took 4 shots of Vodka. They were having intercourse when he fell out and the left side of his body went numb. He was sweating profusely and kept saying "my brain" and then started having seizures. This happened about 30 mins to an hours after taking marijuana. No prior hx of seizures. He has been reporting headache for the last 3-4 days. Today started throwing up. He threw up a lot today. Spends a lot of time in a shed but comes to  sister's home every night to sleep. The shed does not have proper ventilation, it is full of stuff but nothing toxic that sister or fiance is aware of. No obvious exposure to Carbon Monoxide. No fever, no chills but has been feeling cold.   The fiance immediately called EMS for concern for seizure like activity. Per notes, the seizure like activity stopped enroute by the time EMS got an IV so he was not given any Versed. It resumed in the ED and he was given ativan 4mg , Keppra 2G in the ED with persistent shaking and was intubated with rocuronium and etomidate and started on propofol.   On my evaluation, he has tongue movements and twitching along with jerks mostly in the LUE and RLE. Jerking appears arhythmic on my evaluation and ?myoclonic.   No recent water fasts, he is not a regular drinker, has used marijuana in the past without any issues. Fiance and another friend also smoked the same marijuana without any issues. He was fine all day otherwise. No recent head injury with loss of consciousness, no family history of seizures.  CT head was negative for a large hypodensity concerning for a large territory infarct or hyperdensity concerning for an ICH.  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 In addition, imaging notable for a distal basilar thrombus, R PCA P2 occlusion and Left PCA P1 occlusion. A code stroke was initiated and case discussed with Dr. for evaluation for potential thrombectomy. Dr. Corliss Skains discussed risks and benefits and the need for thrombectomy with patient's sister Corliss Skains over the phone with the fiance listening in over the speaker. The sister consented for the surgery and he was taken to IR for a stent assisted angioplasty of proximal Left VA.   MRI brain is pending.   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   Intermittent involuntary movements which appear to be stimulus sensitive myoclonus versus tremors of unclear etiology which have resolved.  Doubt seizures.  Code Stroke  CT head No acute abnormality.  Small vessel disease. Atrophy.  ASPECTS 10.     CTA head & neck   1. Intimal irregularity with filling defect involving  the proximal left vertebral artery, V1 segment, concerning for acute arterial dissection. 2. Subtle irregular nonocclusive filling defect involving the distal basilar artery/basilar tip, consistent with associated distal embolic disease. Adjacent superior cerebellar arteries are not visualized at their origins, and could be occluded. 3. Associated partially occlusive thrombus with severe stenosis involving the left P1 segment. 4. Additional distal right P2 embolic occlusion. 5. Wide patency of both carotid artery systems and anterior circulation. 6. Negative CT venogram.  No evidence for dural sinus thrombosis.  Cerebral angio  S/P bilateral vert  artery and RT common carotid arteriograms . RT CFA approach. S/P revascularization of occuded distal basilar artery and both PCAs with x1 pass with 40 mm solitaireX retriever and contact  Aspiration achieving a TICI 2C revascularization.  Mechanical thrombectomy stent assisted angioplasty for symptomatic prox LT VA  Dissection with distal protection.   Post IR CT  No acute intracranial pathology. No evidence of intracranial hemorrhage. EEG.  Excessive beta activity.  No seizures MRI multifocal infarcts involving bilateral superior cerebellum, left pons, central midbrain, right thalamus, medial temporal and occipital lobes  2D Echo EF 60-65%, normal LA, no IA shunt   LDL No results found for requested labs within last 16109 hours. HgbA1c 5.7 VTE prophylaxis - scd EEG: within normal limits. No seizures or epileptiform  activity noted.     Diet   Diet NPO time specified   No antithrombotic prior to admission, now on aspirin 81 mg daily and Brilinta (ticagrelor) 90 mg bid.   Therapy recommendations:  pending Disposition:  pending   Hypertension Home meds:   none Unstable Permissive hypertension (OK if < 220/120) but gradually normalize in 5-7 days Long-term BP goal normotensive  Hyperlipidemia Home meds:  none LDL No results found for requested labs within last 60454 hours., goal < 70 Add lipitor    High intensity statin   Continue statin at discharge  HgbA1c 5.7, goal < 7.0 CBGs Recent Labs    07/28/21 0338 07/28/21 0745 07/28/21 1204  GLUCAP 104* 93 77    SSI  Other Stroke Risk Factors   Cigarette smoker     Substance abuse - UDS:  THC POSITIVE, Cocaine NONE DETECTED. Patient advised to stop using due to stroke risk.  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  He underwent emergent mechanical thrombectomy with residual proximal left vertebral artery stenting.  Recommend mobilize out of bed.  Physical Occupational Therapy consults and speech therapy for swallow eval.  Continue aspirin and Brilinta for left vertebral artery stent.  Discussed with his grandmother and aunt at the bedside and answered questions.  Discussed with Dr. Corliss Skains and Dr. Merrily Pew critical care medicine. This patient is critically ill and at significant risk of neurological worsening, death and care requires constant monitoring of vital signs, hemodynamics,respiratory and cardiac monitoring, extensive review of  multiple databases, frequent neurological assessment, discussion with family, other specialists and medical decision making of high complexity.I have made any additions or clarifications directly to the above note.This critical care time does not reflect procedure time, or teaching time or supervisory time of PA/NP/Med Resident etc but could  involve care discussion time.  I spent 40 minutes of neurocritical care time  in the care of  this patient.     Delia Heady, MD Medical Director [facility] Pager: [phone] 07/28/2021 2:16 PM     To contact Stroke Continuity provider, please refer to [email or web address]. After hours, contact General Neurology

## 2021-07-28 NOTE — Evaluation (Signed)
Physical Therapy Evaluation Patient Details Name: Roy [name] MRN: [id number] DOB: 12-Jan-1996 Today's Date: 07/28/2021  History of Present Illness  Pt is 25 yo male who presents seizures while smoking marijuana and drinking EtOH with girlfriend at home.  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 No significant PMH  Clinical Impression  Patient admitted with above diagnosis. Patient presents with impaired balance, weakness, decreased activity tolerance, impaired cognition (unsure if patient with baseline cognitive deficits - no family present), impaired coordination, impaired communication (expressive difficulties). Patient on 3L O2 [place] during session with spO2 >95%, increased WOB but responds with cues to prevent hyperventilation. Patient requires modA+2 for bed mobility and maxA+2 for sit to stand transfer with bilateral knees blocked. Limited to sit to stand only due to SBP 141. Patient will benefit from skilled PT services during acute stay to address listed deficits. Recommend CIR at discharge to maximize functional independence and safety.      Recommendations for follow up therapy are one component of a multi-disciplinary discharge planning process, led by the attending physician.  Recommendations may be updated based on patient status, additional functional criteria and insurance authorization.  Follow Up Recommendations Acute inpatient rehab (3hours/day)    Assistance Recommended at Discharge Frequent or constant Supervision/Assistance  Functional Status Assessment Patient has had a recent decline in their functional status and demonstrates the ability to make significant improvements in function in a reasonable and predictable amount of time.  Equipment Recommendations  Other (comment) (TBD)    Recommendations for Other Services Rehab consult     Precautions / Restrictions Precautions Precautions: Fall Precaution Comments: BP  <140 Restrictions Weight Bearing Restrictions: No      Mobility  Bed Mobility Overal bed mobility: Needs Assistance Bed Mobility: Supine to Sit;Sit to Supine     Supine to sit: +2 for physical assistance;Mod assist;+2 for safety/equipment;HOB elevated Sit to supine: +2 for physical assistance;Total assist;+2 for safety/equipment   General bed mobility comments: pt needs (A) to progress bil LE and core toward EOB. pt once on R side able to push up into sitting min (A). pt sitting min - mod (A) initially. pt unable to touch floor with bil LE due to height. pt requires +2 total (A) to return to supine    Transfers Overall transfer level: Needs assistance Equipment used: 2 person hand held assist Transfers: Sit to/from Stand Sit to Stand: +2 safety/equipment;Max assist;+2 physical assistance;From elevated surface           General transfer comment: pt unable to sustain static standing and requires blocking bil LE    Ambulation/Gait                  Stairs            Wheelchair Mobility    Modified Rankin (Stroke Patients Only)       Balance Overall balance assessment: Needs assistance Sitting-balance support: Bilateral upper extremity supported;Feet supported Sitting balance-Leahy Scale: Poor     Standing balance support: Bilateral upper extremity supported;During functional activity Standing balance-Leahy Scale: Zero                               Pertinent Vitals/Pain Pain Assessment: Faces Faces Pain Scale: Hurts a little bit Pain Location: generalized Pain Descriptors / Indicators: Discomfort Pain Intervention(s): Limited activity within patient's tolerance;Monitored during session    Home Living Family/patient expects to be discharged to::  Private residence                   Additional Comments: lives at sister house and working at [employer] but due to expressive deficits unable to fully obtain PTA living arrangements. pt  becoming liable    Prior Function Prior Level of Function : Working/employed                     Hand Dominance   Dominant Hand: Right    Extremity/Trunk Assessment   Upper Extremity Assessment Upper Extremity Assessment: Defer to OT evaluation    Lower Extremity Assessment Lower Extremity Assessment: Generalized weakness    Cervical / Trunk Assessment Cervical / Trunk Assessment: Normal  Communication   Communication: Expressive difficulties  Cognition Arousal/Alertness: Awake/alert Behavior During Therapy: Flat affect Overall Cognitive Status: Impaired/Different from baseline Area of Impairment: Orientation;Attention;Safety/judgement;Following commands;Awareness;Problem solving                 Orientation Level: Disoriented to;Place (said we are in the shed) Current Attention Level: Sustained   Following Commands: Follows one step commands inconsistently Safety/Judgement: Decreased awareness of safety;Decreased awareness of deficits Awareness: Intellectual Problem Solving: Slow processing General Comments: pt reports currently being at sister's house in the shed. pt liable and needing cues to calm down after inability to don socks. pt states "i cant do it" pt needs cues to prevent hyperventation Functional Status Assessment: Patient has had a recent decline in their functional status and demonstrates the ability to make significant improvements in function in a reasonable and predictable amount of time.      General Comments General comments (skin integrity, edema, etc.): SBP low 140s and therapist monitoring closely. pt with [place] 3L HR 120s    Exercises     Assessment/Plan    PT Assessment Patient needs continued PT services  PT Problem List Decreased strength;Decreased activity tolerance;Decreased balance;Decreased mobility;Decreased cognition;Decreased coordination;Decreased safety awareness;Decreased knowledge of use of DME;Decreased knowledge of  precautions;Cardiopulmonary status limiting activity       PT Treatment Interventions DME instruction;Gait training;Stair training;Therapeutic exercise;Functional mobility training;Therapeutic activities;Balance training;Patient/family education;Neuromuscular re-education    PT Goals (Current goals can be found in the Care Plan section)  Acute Rehab PT Goals PT Goal Formulation: With patient Time For Goal Achievement: 08/11/21 Potential to Achieve Goals: Good    Frequency Min 4X/week   Barriers to discharge        Co-evaluation PT/OT/SLP Co-Evaluation/Treatment: Yes Reason for Co-Treatment: For patient/therapist safety;To address functional/ADL transfers;Necessary to address cognition/behavior during functional activity;Complexity of the patient's impairments (multi-system involvement) PT goals addressed during session: Mobility/safety with mobility;Balance OT goals addressed during session: ADL's and self-care;Proper use of Adaptive equipment and DME;Strengthening/ROM       AM-PAC PT "6 Clicks" Mobility  Outcome Measure Help needed turning from your back to your side while in a flat bed without using bedrails?: A Lot Help needed moving from lying on your back to sitting on the side of a flat bed without using bedrails?: Total Help needed moving to and from a bed to a chair (including a wheelchair)?: Total Help needed standing up from a chair using your arms (e.g., wheelchair or bedside chair)?: Total Help needed to walk in hospital room?: Total Help needed climbing 3-5 steps with a railing? : Total 6 Click Score: 7    End of Session Equipment Utilized During Treatment: Gait belt;Oxygen Activity Tolerance: Patient tolerated treatment well Patient left: in bed;with call bell/phone within reach;with bed alarm set;with  nursing/sitter in room Nurse Communication: Mobility status PT Visit Diagnosis: Unsteadiness on feet (R26.81);Muscle weakness (generalized) (M62.81);Difficulty in  walking, not elsewhere classified (R26.2);Other abnormalities of gait and mobility (R26.89);Ataxic gait (R26.0);Other symptoms and signs involving the nervous system (R29.898)    Time: 0626-9485 PT Time Calculation (min) (ACUTE ONLY): 26 min   Charges:   PT Evaluation $PT Eval Moderate Complexity: 1 Mod PT Treatments $Therapeutic Activity: 8-22 mins        Jiraiya Mcewan A. Dan Humphreys PT, DPT Acute Rehabilitation Services Pager [phone] Office [phone]    Viviann Spare 07/28/2021, 3:25 PM

## 2021-07-28 NOTE — Evaluation (Signed)
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 No pertinent past medical history. Past Surgical History:  Past Surgical History:  Procedure Laterality Date   IR ANGIO EXTRACRAN SEL COM CAROTID INNOMINATE UNI R MOD SED  07/27/2021   IR CT HEAD LTD  07/27/2021   IR PERCUTANEOUS ART THROMBECTOMY/INFUSION INTRACRANIAL INC DIAG ANGIO  07/27/2021   IR TRANSCATH EXCRAN VERT OR CAR A STENT  07/27/2021   RADIOLOGY WITH ANESTHESIA N/A 07/27/2021   Procedure: IR WITH ANESTHESIA;  Surgeon: Julieanne Cotton, MD;  Location: [facility] OR;  Service: Radiology;  Laterality: N/A;   HPI:  25 y.o M presenting with acute onset seizures after having significant headaches for three days and intake of alcohol and marajuana. Imaging revealed Acute infarcts involving the right thalamus, medial aspect of the  right occipital and temporal lobes, including right hippocampus,  centrally in the midbrain, on the left side of the pons and in the  bilateral cerebellar hemispheres. Petechial hemorrhage seen in the  left inferior cerebellar hemisphere right occipital lobe. Intubated 11/7-11/8   Assessment / Plan / Recommendation Clinical Impression  Pt was seen for a speech-language evaluation. Overall, pt presents with perceivably mild cognitive impairments and significantly impacted motor speech with relatively intact expressive/receptive language. Oral mechanism exam revealed swollen tongue and face, right asymmetry and reduced facial ROM, and bilateral reduced lingual ROM and strength. Motor speech  deficits c/b imprecise articulation further impacted by swelling of tongue, strangled phonatory quality, and suspected hypernasality  marked by suspected bulbar palsy characteristics requiring further assessment ; speech intelligibility ~25% across word and phrase level utterances, however pt greatly benefited from verbal cues and models to incorporate slow rate and over-articulation into speech, increasing intelligibility of individual words to ~50%. Pt exhibits reduced orientation to place and time with intact orientation to person and situation and awareness is developing. Pt became weepy when realizing motor impairments. Speech intelligibility currently impacting ability to assess cognition and pt will require further diagnostic therapy. SLP will continue to follow pt acutely for therapy targeting compensatory strategies for speech intelligibility and cognition.    SLP Assessment  SLP Recommendation/Assessment: Patient needs continued Speech Lanaguage Pathology Services SLP Visit Diagnosis: Dysarthria and anarthria (R47.1);Cognitive communication deficit (R41.841)    Recommendations for follow up therapy are one component of a multi-disciplinary discharge planning process, led by the attending physician.  Recommendations may be updated based on patient status, additional functional criteria and insurance authorization.    Follow Up Recommendations  Acute inpatient rehab (3hours/day)    Assistance Recommended at Discharge  PRN  Functional Status Assessment Patient has had a recent decline in their functional status and demonstrates the ability to make significant improvements in function in a reasonable and predictable amount of time.  Frequency and Duration min 2x/week  2 weeks      SLP Evaluation Cognition  Overall Cognitive Status: Impaired/Different from baseline Arousal/Alertness: Awake/alert Orientation Level: Oriented X4 Month: October Attention: Sustained Sustained Attention:  Impaired Sustained Attention Impairment: Verbal basic Memory: Impaired Awareness: Impaired Awareness Impairment: Anticipatory impairment;Intellectual impairment Problem Solving: Appears intact Executive Function: Self Monitoring;Self Correcting Self Monitoring: Appears intact  Self Correcting: Appears intact       Comprehension  Auditory Comprehension Overall Auditory Comprehension: Appears within functional limits for tasks assessed Visual Recognition/Discrimination Discrimination: Not tested Reading Comprehension Reading Status: Not tested    Expression Expression Primary Mode of Expression: Verbal Verbal Expression Overall Verbal Expression: Appears within functional limits for tasks assessed Interfering Components: Speech intelligibility Written Expression Dominant Hand: Right Written Expression: Not tested   Oral / Motor  Oral Motor/Sensory Function Overall Oral Motor/Sensory Function: Moderate impairment Facial ROM: Reduced right;Reduced left Facial Symmetry: Abnormal symmetry right Facial Strength: Reduced right;Reduced left (more significant on right side) Lingual ROM: Reduced right;Reduced left Lingual Symmetry: Within Functional Limits Lingual Strength: Reduced Velum: Other (comment) (further assessment needed) Mandible: Impaired Motor Speech Overall Motor Speech: Impaired Respiration: Within functional limits Phonation: Low vocal intensity (strangled) Resonance: Hypernasality Articulation: Impaired Level of Impairment: Word Intelligibility: Intelligibility reduced Word: 0-24% accurate Phrase: 0-24% accurate Sentence: 0-24% accurate Conversation: 0-24% accurate Motor Planning: Witnin functional limits Motor Speech Errors: Aware Effective Techniques: Slow rate;Increased vocal intensity;Over-articulate   GO           Jeannie Done, SLP-Student         Jeannie Done 07/28/2021, 2:56 PM

## 2021-07-28 NOTE — Progress Notes (Signed)
Verbal order by Sheilah Mins (stroke team) to go by cuff pressures because ART line has a whip. Still BP <140 goal. Will keep ART line for today and reassess tomorrow.  Sherral Hammers RN

## 2021-07-28 NOTE — Progress Notes (Signed)
[facility] ADULT ICU REPLACEMENT PROTOCOL   The patient does apply for the [facility] Adult ICU Electrolyte Replacment Protocol based on the criteria listed below:   1.Exclusion criteria: TCTS patients, ECMO patients, and Dialysis patients 2. Is GFR >/= 30 ml/min? Yes.    Patient's GFR today is >60 3. Is SCr </= 2? Yes.   Patient's SCr is 0.87 mg/dL 4. Did SCr increase >/= 0.5 in 24 hours? No. 5.[name] weight >40kg  Yes.   6. Abnormal electrolyte(s): K+ 3.2  7. Electrolytes replaced per protocol 8.  Call MD STAT for K+ </= 2.5, Phos </= 1, or Mag </= 1 Physician:  n/a  Melvern Banker 07/28/2021 6:42 AM

## 2021-07-28 NOTE — TOC CAGE-AID Note (Signed)
Transition of Care [facility]) - CAGE-AID Screening   Patient Details  Name: Roy [name] MRN: [id number] Date of Birth: [birth year]  [facility]) CM/SW Contact:    Makahla Kiser C Tarpley-Carter, LCSWA Phone Number: 07/28/2021, 3:07 PM   Clinical Narrative: Pt participated in Cage-Aid.  Pt stated he does not use substance or ETOH. Pt does smoke marijuana.  Pt was offered resources, and pt accepted.  Yasaman Kolek Tarpley-Carter, MSW, LCSW-A Pronouns:  She/Her/Hers [facility]itions [facility] Clinical Social Worker Direct Number:  [phone] [email or web address]  CAGE-AID Screening:    Have You Ever Felt You Ought to Cut Down on Your Drinking or Drug Use?: No Have People Annoyed You By [employer] Your Drinking Or Drug Use?: No Have You Felt Bad Or Guilty About Your Drinking Or Drug Use?: No Have You Ever Had a Drink or Used Drugs First Thing In The Morning to Steady Your Nerves or to Get Rid of a Hangover?: No CAGE-AID Score: 0  Substance Abuse Education Offered: Yes  Substance abuse interventions: Transport planner

## 2021-07-28 NOTE — Progress Notes (Signed)
NAME:  Roy [name], MRN:  [id number], DOB:  07-11-1996, LOS: 2 ADMISSION DATE:  07/26/2021 CONSULTATION DATE:  07/26/2021 REFERRING MD:  Deretha Emory - EDP CHIEF COMPLAINT:  AMS, ?Seizure   History of Present Illness:  25 year old man who presented [facility] ED via [facility] 11/7 for AMS. No significant PMHx. Per chart review, patient was found altered in his shed at home and movements concerning for seizure activity were noted. Patient vomited x 3 en route with EMS. Active EtOH and THC use per family. No known history of seizure activity.  On ED arrival, patient was reportedly sweating profusely and unable to follow commands. Vitals were notable for tachycardia (117) and hypotension (91/64); SpO2 99% on RA. CBG 128. Given persistent AMS and inability to protect airway, patient was intubated in ED. CT Head was completed with NAICA. Neuro was consulted for STAT EEG and evaluation. Keppra load completed and Ativan 4mg  total administered.  PCCM consulted for admission.  Pertinent Medical History:  EtOH, marijuana use  Significant Hospital Events: Including procedures, antibiotic start and stop dates in addition to other pertinent events   11/7 - Presented to [facility] ED via [facility] for AMS. Found altered in shed at home. C/f seizure activity, vomited x 3 en route. Persistently altered/combative in ED requiring restraints and intubation for airway protection.  Interim History / Subjective:  MRI brain confirmed basilar artery territory multiple acute strokes He has been tolerating spontaneous breathing trial, will try to extubate him  Objective:  Blood pressure 114/64, pulse (!) 110, temperature 98.2 F (36.8 C), temperature source Oral, resp. rate (!) 26, height 5\' 2"  (1.575 m), weight 105.1 kg, SpO2 97 %.    Vent Mode: PSV;CPAP FiO2 (%):  [30 %-40 %] 30 % Set Rate:  [18 bmp] 18 bmp Vt Set:  [430 mL] 430 mL PEEP:  [5 cmH20] 5 cmH20 Pressure Support:  [8 cmH20] 8 cmH20 Plateau Pressure:  [16 cmH20-19  cmH20] 16 cmH20   Intake/Output Summary (Last 24 hours) at 07/28/2021 1510 Last data filed at 07/28/2021 1300 Gross per 24 hour  Intake 1194.36 ml  Output 1600 ml  Net -405.64 ml   Filed Weights   07/26/21 2245 07/28/21 0500  Weight: 95.3 kg 105.1 kg   Physical Examination: General: Acutely ill-appearing young adult man, orally intubated HEENT: [place]/AT, anicteric sclera, mildly injected. Pupils equal and pinpoint 63mm, sluggishly reactive (sedated, s/p intubation meds), moist mucous membranes. ETT in place. Neuro: Opens eyes with vocal stimuli, following simple commands, antigravity in all 4 extremities weaker on right side, disconjugate gaze CV: Regular rate and rhythm, no m/g/r. PULM: Breathing even and unlabored on vent (PEEP 5, FiO2 100%). Lung fields CTAB anteriorly. GI: Obese, soft, nontender, nondistended. Normoactive bowel sounds. Extremities: No LE edema noted. Skin: Warm/dry, no rashes.  Resolved Hospital Problem List:    Assessment & Plan:  Acute toxic encephalopathy, presumed 2/2 substance use Found altered at home, per family EtOH and THC use. 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 right thalamus, medial aspect of the right occipital and temporal lobes, including right hippocampus, centrally in  the midbrain, on the left side of the pons and in the bilateral cerebellar hemispheres Continue neuro watch Stroke team is following Continue secondary stroke prophylaxis     Concern for seizure activity Concern for seizure activity noted en route with EMS; per ED RN no rhythmic motion noted but described as "spastic" movements and "foaming at the mouth". No history of seizures.  Neurology is following, s/p Keppra load EEG is negative for seizures  Acute hypoxemic respiratory failure secondary to encephalopathy Patient is tolerating spontaneous breathing trial, watch for signs of respiratory distress Will try to give him extubation trial VAP bundle Pulmonary hygiene  Hypokalemia/hypocalcemia Closely monitor electrolytes, continue aggressive supplementation  Morbid obesity Dietitian is following, cortak was palced  Best Practice: (right click and "Reselect all SmartList Selections" daily)   Diet/type: NPO w/ meds via tube DVT prophylaxis: prophylactic heparin  GI prophylaxis: PPI Lines: N/A Foley:  N/A Code Status:  full code Last date of multidisciplinary goals of care discussion [11/8: Patient's sister and fianc was updated at bedside]  Labs:  CBC: Recent Labs  Lab 07/27/21 0031 07/27/21 0046 07/27/21 0500 07/27/21 1223 07/28/21 0519  WBC 19.0*  --  13.9*  --  11.6*  NEUTROABS 15.0*  --   --   --  8.4*  HGB 13.2 13.6 11.9* 11.9* 11.5*  HCT 39.9 40.0 34.6* 35.0* 34.1*  MCV 91.9  --  89.4  --  91.4  PLT 179  --  319  --  313    Basic Metabolic Panel: Recent Labs  Lab 07/27/21 0031 07/27/21 0046 07/27/21 0500 07/27/21 1223 07/28/21 0519  NA 139 140 138 140 137  K 3.0* 2.9* 3.8 3.7 3.2*  CL 107 110 109  --  106  CO2 18*  --  20*  --  23  GLUCOSE 112* 115* 103*  --  96  BUN 11 11 9   --  7  CREATININE 1.13 1.00 1.00  --  0.87  CALCIUM 8.5*  --  8.1*  --  8.3*  MG 1.7  --  1.9  --   --   PHOS  --   --  4.1  --   --    GFR: Estimated Creatinine Clearance: 136.1 mL/min (by C-G formula based on SCr of 0.87 mg/dL). Recent Labs  Lab 07/27/21 0031 07/27/21 0500 07/27/21 0606 07/28/21 0519  WBC 19.0* 13.9*  --  11.6*   LATICACIDVEN 4.4*  --  2.8*  --     Liver Function Tests: Recent Labs  Lab 07/27/21 0031  AST 38  ALT 38  ALKPHOS 59  BILITOT 0.6  PROT 6.7  ALBUMIN 3.6   No results for input(s): LIPASE, AMYLASE in the last 168 hours. Recent Labs  Lab 07/27/21 0031  AMMONIA 58*    ABG:    Component Value Date/Time   PHART 7.370 07/27/2021 1223   PCO2ART 39.7 07/27/2021 1223   PO2ART 96 07/27/2021 1223   HCO3 22.9 07/27/2021 1223   TCO2 24 07/27/2021 1223   ACIDBASEDEF 2.0 07/27/2021 1223   O2SAT 90.3 07/28/2021 0656     Coagulation Profile: No results for input(s): INR, PROTIME in the last 168 hours.  Cardiac Enzymes: No results for input(s): CKTOTAL, CKMB, CKMBINDEX, TROPONINI in the last 168 hours.  HbA1C: Hgb A1c MFr Bld  Date/Time Value Ref Range Status  07/27/2021 05:00 AM 5.7 (H) 4.8 - 5.6 % Final    Comment:    (NOTE) Pre diabetes:  5.7%-6.4%  Diabetes:              >6.4%  Glycemic control for   <7.0% adults with diabetes     CBG: Recent Labs  Lab 07/27/21 1929 07/27/21 2320 07/28/21 0338 07/28/21 0745 07/28/21 1204  GLUCAP 80 101* 104* 93 77    Critical care time:    Total critical care time: 41 minutes  Performed by: Cheri Fowler   Critical care time was exclusive of separately billable procedures and treating other patients.   Critical care was necessary to treat or prevent imminent or life-threatening deterioration.   Critical care was time spent personally by me on the following activities: development of treatment plan with patient and/or surrogate as well as nursing, discussions with consultants, evaluation of patient's response to treatment, examination of patient, obtaining history from patient or surrogate, ordering and performing treatments and interventions, ordering and review of laboratory studies, ordering and review of radiographic studies, pulse oximetry and re-evaluation of patient's condition.   Cheri Fowler MD [place]  Pulmonary Critical Care See Amion for pager If no response to pager, please call [phone] until 7pm After 7pm, Please call E-link [phone]

## 2021-07-28 NOTE — Procedures (Signed)
Extubation Procedure Note  Patient Details:   Name: Roy [name] DOB: Oct 07, 1995 MRN: [id number]   Airway Documentation:   Patient extubated per orders. Patient had very small cuff leak prior to extubation, R Desai notified and okay to still extubate since patient is awake and following commands. Placed on 4lpm nasal cannula. Pt has strong cough and voice. BBS clear, no stridor noted in upper airways. Will continue to monitor. Vent end date: 07/28/21 Vent end time: 0846   Evaluation  O2 sats: stable throughout Complications: No apparent complications Patient did tolerate procedure well. Bilateral Breath Sounds: Clear   Yes  Suszanne Conners 07/28/2021, 8:47 AM

## 2021-07-28 NOTE — Procedures (Addendum)
Patient Name: Roy [name]  MRN: [id number]  Epilepsy Attending: Charlsie Quest  Referring Physician/Provider: Dr Erick Blinks Duration: 07/27/2021 1058 to 07/28/2021 1027   Patient history: 25 year old male with seizure-like episodes.  EEG to evaluate for seizure.   Level of alertness: Lethargic   AEDs during EEG study: Propofol   Technical aspects: This EEG study was done with scalp electrodes positioned according to the 10-20 International system of electrode placement. Electrical activity was acquired at a sampling rate of 500Hz  and reviewed with a high frequency filter of 70Hz  and a low frequency filter of 1Hz . EEG data were recorded continuously and digitally stored.    Description: EEG initially showed an excessive amount of 15 to 18 Hz beta activity  distributed symmetrically and diffusely. Gradually, EEG showed posterior dominant rhythm of 9 Hz activity of moderate voltage (25-35 uV) seen predominantly in posterior head regions, symmetric and reactive to eye opening and eye closing.   Hyperventilation and photic stimulation were not performed.      ABNORMALITY - Excessive beta, generalized   IMPRESSION: This study is within normal limits. No seizures or epileptiform discharges were seen throughout the recording.   The excessive beta activity seen in the background is most likely due to the effect of medications like benzodiazepine and is a benign EEG pattern.    Mikala Podoll 

## 2021-07-28 NOTE — Progress Notes (Signed)
250 cc fentanyl wasted with Renold Don, RN.   Sherral Hammers RN

## 2021-07-28 NOTE — Procedures (Signed)
Cortrak  Tube Type:  Cortrak - 43 inches Tube Location:  Right nare Initial Placement:  Stomach Secured by: Bridle Technique Used to Measure Tube Placement:  Marking at nare/corner of mouth Cortrak Secured At:  75 cm  Cortrak Tube Team Note:  Consult received to place a Cortrak feeding tube.   X-ray is required, abdominal x-ray has been ordered by the Cortrak team. Please confirm tube placement before using the Cortrak tube.   If the tube becomes dislodged please keep the tube and contact the Cortrak team at [email or web address] ([email or web address]) for replacement.  If after hours and replacement cannot be delayed, place a NG tube and confirm placement with an abdominal x-ray.    Athelene Hursey MS, RD, LDN Please refer to AMION for RD and/or RD on-call/weekend/after hours pager   

## 2021-07-28 NOTE — Evaluation (Signed)
Clinical/Bedside Swallow Evaluation Patient Details  Name: Roy [name] MRN: [id number] Date of Birth: October 09, 27 1997  Today's Date: 07/28/2021 Time: SLP Start Time (ACUTE ONLY): 1143 SLP Stop Time (ACUTE ONLY): 1153 SLP Time Calculation (min) (ACUTE ONLY): 10 min  Past Medical History: History reviewed. No pertinent past medical history. Past Surgical History:  Past Surgical History:  Procedure Laterality Date   IR ANGIO EXTRACRAN SEL COM CAROTID INNOMINATE UNI R MOD SED  07/27/2021   IR CT HEAD LTD  07/27/2021   IR PERCUTANEOUS ART THROMBECTOMY/INFUSION INTRACRANIAL INC DIAG ANGIO  07/27/2021   IR TRANSCATH EXCRAN VERT OR CAR A STENT  07/27/2021   RADIOLOGY WITH ANESTHESIA N/A 07/27/2021   Procedure: IR WITH ANESTHESIA;  Surgeon: Julieanne Cotton, MD;  Location: [facility] OR;  Service: Radiology;  Laterality: N/A;   HPI:  25 y.o M presenting with acute onset seizures after having significant headaches for three days and intake of alcohol and marajuana. Imaging revealed Acute infarcts involving the right thalamus, medial aspect of the  right occipital and temporal lobes, including right hippocampus,  centrally in the midbrain, on the left side of the pons and in the  bilateral cerebellar hemispheres. Petechial hemorrhage seen in the  left inferior cerebellar hemisphere right occipital lobe. Intubated 11/7-11/8    Assessment / Plan / Recommendation  Clinical Impression  Pt was seen for a bedside swallow evaluation. Pt increasingly awake and alert throughout session however exhibited inhalatory stridor and strangled/breathy voice quality. Oral mechanism exam revealed swollen tongue and face, right asymmetry and reduced facial ROM, and bilateral reduced lingual ROM and strength. SLP completed oral care and trailed ice chips, thin liquid from spoon and cup, and puree solids. Pt exhibited difficulty recieving boluses d/t swelling in tongue, also resulting in minimal anterior oral spillage with thin liquid  and puree. Pt benefitted from placing bolus further back in oral cavity to prevent spillage. Pharyngeally, pt exhibited immediate cough with thin liquid from cup and puree, suggesting probable penetration/aspiration. Recommend NPO, administering nutrition and medications via alternative means. SLP will continue to follow for PO trials as appropriate. SLP Visit Diagnosis: Dysphagia, unspecified (R13.10)    Aspiration Risk  Moderate aspiration risk    Diet Recommendation NPO;Alternative means - temporary   Medication Administration: Via alternative means    Other  Recommendations Oral Care Recommendations: Oral care QID    Recommendations for follow up therapy are one component of a multi-disciplinary discharge planning process, led by the attending physician.  Recommendations may be updated based on patient status, additional functional criteria and insurance authorization.  Follow up Recommendations Other (comment) (tbd)      Assistance Recommended at Discharge Other (comment) (tbd)  Functional Status Assessment Patient has had a recent decline in their functional status and demonstrates the ability to make significant improvements in function in a reasonable and predictable amount of time.  Frequency and Duration min 2x/week  2 weeks       Prognosis Prognosis for Safe Diet Advancement: Good Barriers to Reach Goals: Severity of deficits      Swallow Study   General Date of Onset: 07/26/21 HPI: 25 y.o M presenting with acute onset seizures after having significant headaches for three days and intake of alcohol and marajuana. Imaging revealed Acute infarcts involving the right thalamus, medial aspect of the  right occipital and temporal lobes, including right hippocampus,  centrally in the midbrain, on the left side of the pons and in the  bilateral cerebellar hemispheres. Petechial hemorrhage seen in the  left inferior cerebellar hemisphere right occipital lobe. Intubated  11/7-11/8 Type of Study: Bedside Swallow Evaluation Previous Swallow Assessment: n/a Diet Prior to this Study: NPO Temperature Spikes Noted: No Respiratory Status: Nasal cannula History of Recent Intubation: Yes Length of Intubations (days): 1 days Date extubated: 07/27/21 Behavior/Cognition: Alert;Cooperative Oral Cavity Assessment: Other (comment) (swelling of tongue) Oral Care Completed by SLP: Yes Oral Cavity - Dentition: Adequate natural dentition Vision: Functional for self-feeding Self-Feeding Abilities: Total assist Patient Positioning: Upright in bed Baseline Vocal Quality: Hoarse;Low vocal intensity;Other (comment) (strained) Volitional Cough: Weak Volitional Swallow: Able to elicit    Oral/Motor/Sensory Function Overall Oral Motor/Sensory Function: Moderate impairment Facial ROM: Reduced right;Reduced left Facial Symmetry: Abnormal symmetry right Facial Strength: Reduced right;Reduced left (more significant on right side) Lingual ROM: Reduced right;Reduced left Lingual Symmetry: Within Functional Limits Lingual Strength: Reduced Velum:  (further assessment needed) Mandible: Impaired   Ice Chips Ice chips: Within functional limits Presentation: Spoon   Thin Liquid Thin Liquid: Impaired Presentation: Cup;Spoon Oral Phase Impairments: Reduced labial seal Oral Phase Functional Implications: Other (comment) (anterior spillage) Pharyngeal  Phase Impairments: Cough - Immediate    Nectar Thick Nectar Thick Liquid: Not tested   Honey Thick Honey Thick Liquid: Not tested   Puree Puree: Impaired Presentation: Spoon Oral Phase Impairments: Reduced labial seal Oral Phase Functional Implications:  (anterior spillage) Pharyngeal Phase Impairments: Cough - Immediate   Solid    Jeannie Done, SLP-Student  Solid: Not tested      Jeannie Done 07/28/2021,1:58 PM

## 2021-07-28 NOTE — Progress Notes (Signed)
Inpatient Rehab Admissions Coordinator:   Per therapy recommendations,  patient was screened for CIR candidacy by Lissette Schenk, MS, CCC-SLP . At this time, Pt. Appears to be a a potential candidate for CIR. I will request   order for rehab consult per protocol for full assessment. Please contact me any with questions.  Avrom Robarts, MS, CCC-SLP Rehab Admissions Coordinator  [phone] (celll) [phone] (office)  

## 2021-07-28 NOTE — Progress Notes (Signed)
Initial Nutrition Assessment  DOCUMENTATION CODES:   Not applicable  INTERVENTION:   Initiate tube feeding via Cortrak tube once placement confirmed: Osmolite 1.5 at 60 ml/h (1440 ml per day) Prosource TF 45 ml BID  Provides 2240 kcal, 112 gm protein, 1094 ml free water daily  MVI with minerals daily   NUTRITION DIAGNOSIS:   Inadequate oral intake related to inability to eat as evidenced by NPO status.  GOAL:   Patient will meet greater than or equal to 90% of their needs  MONITOR:   Diet advancement, TF tolerance  REASON FOR ASSESSMENT:   Consult Enteral/tube feeding initiation and management  ASSESSMENT:   Pt with PMH of ETOH and marijuana use admitted from home with seizures, acute toxic encephalopathy presumed due to substance use. Per EMS vomited x 3.    Pt discussed during ICU rounds and with RN.  RN reports that pt has very slurred speech and excessive drooling. SLP in to check swallow and recommends NPO and temporary means for nutrition. MD ok with cortrak today.   Per MD notes pt stays in his shed all day but goes to sister's house to sleep at night.   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  11/9 extubated; failed swallow; cortrak to be placed   Medications reviewed and include: colace, protonix, miralax, KCl 20 mEq x 4, thiamine  Labs reviewed: K: 3.2 HgbA1C: 5.7 CBG's: 77-104 (NPO)   Height inaccurate  NUTRITION - FOCUSED PHYSICAL EXAM:  No depletions noted  Diet Order:   Diet Order             Diet NPO time specified  Diet effective now                   EDUCATION NEEDS:   Not appropriate for education at this time  Skin:  Skin Assessment: Reviewed RN Assessment (groin incision)  Last BM:  unknown  Height:    Ht Readings from Last 1 Encounters:  07/27/21 5\' 2"  (1.575 m)    Weight:   Wt Readings from Last 1 Encounters:  07/28/21 105.1 kg    Ideal Body Weight:     BMI:  Body mass index is 42.38 kg/m.  Estimated Nutritional Needs:   Kcal:  2200-2400  Protein:  105-125 grams  Fluid:  > 2 L/day  13/09/22., RD, LDN, CNSC See AMi[facility] for contact information

## 2021-07-29 ENCOUNTER — Inpatient Hospital Stay (HOSPITAL_COMMUNITY): Payer: Medicaid Other

## 2021-07-29 DIAGNOSIS — G9341 Metabolic encephalopathy: Secondary | ICD-10-CM

## 2021-07-29 DIAGNOSIS — I6302 Cerebral infarction due to thrombosis of basilar artery: Secondary | ICD-10-CM

## 2021-07-29 LAB — GLUCOSE, CAPILLARY
Glucose-Capillary: 122 mg/dL — ABNORMAL HIGH (ref 70–99)
Glucose-Capillary: 123 mg/dL — ABNORMAL HIGH (ref 70–99)
Glucose-Capillary: 104 mg/dL — ABNORMAL HIGH (ref 70–99)
Glucose-Capillary: 125 mg/dL — ABNORMAL HIGH (ref 70–99)
Glucose-Capillary: 104 mg/dL — ABNORMAL HIGH (ref 70–99)
Glucose-Capillary: 101 mg/dL — ABNORMAL HIGH (ref 70–99)

## 2021-07-29 LAB — CBC WITH DIFFERENTIAL/PLATELET
Abs Immature Granulocytes: 0.02 10*3/uL (ref 0.00–0.07)
Basophils Absolute: 0 10*3/uL (ref 0.0–0.1)
Basophils Relative: 0 %
Eosinophils Absolute: 0.1 10*3/uL (ref 0.0–0.5)
Eosinophils Relative: 1 %
HCT: 34 % — ABNORMAL LOW (ref 39.0–52.0)
Hemoglobin: 11.4 g/dL — ABNORMAL LOW (ref 13.0–17.0)
Immature Granulocytes: 0 %
Lymphocytes Relative: 22 %
Lymphs Abs: 2.2 10*3/uL (ref 0.7–4.0)
MCH: 30.2 pg (ref 26.0–34.0)
MCHC: 33.5 g/dL (ref 30.0–36.0)
MCV: 90.2 fL (ref 80.0–100.0)
Monocytes Absolute: 1.2 10*3/uL — ABNORMAL HIGH (ref 0.1–1.0)
Monocytes Relative: 11 %
Neutro Abs: 6.8 10*3/uL (ref 1.7–7.7)
Neutrophils Relative %: 66 %
Platelets: 334 10*3/uL (ref 150–400)
RBC: 3.77 MIL/uL — ABNORMAL LOW (ref 4.22–5.81)
RDW: 13.3 % (ref 11.5–15.5)
WBC: 10.3 10*3/uL (ref 4.0–10.5)
nRBC: 0 % (ref 0.0–0.2)

## 2021-07-29 LAB — PHOSPHORUS
Phosphorus: 2.8 mg/dL (ref 2.5–4.6)
Phosphorus: 3.2 mg/dL (ref 2.5–4.6)

## 2021-07-29 LAB — VITAMIN B1: Vitamin B1 (Thiamine): 128.3 nmol/L (ref 66.5–200.0)

## 2021-07-29 LAB — BASIC METABOLIC PANEL
Anion gap: 8 (ref 5–15)
BUN: 9 mg/dL (ref 6–20)
CO2: 23 mmol/L (ref 22–32)
Calcium: 8.9 mg/dL (ref 8.9–10.3)
Chloride: 103 mmol/L (ref 98–111)
Creatinine, Ser: 0.77 mg/dL (ref 0.61–1.24)
GFR, Estimated: >60 mL/min (ref >60)
Glucose, Bld: 104 mg/dL — ABNORMAL HIGH (ref 70–99)
Potassium: 4.1 mmol/L (ref 3.5–5.1)
Sodium: 134 mmol/L — ABNORMAL LOW (ref 135–145)

## 2021-07-29 LAB — MAGNESIUM
Magnesium: 1.9 mg/dL (ref 1.7–2.4)
Magnesium: 2.2 mg/dL (ref 1.7–2.4)

## 2021-07-29 MED ORDER — MAGNESIUM SULFATE 2 GM/50ML IV SOLN
2.0000 g | Freq: Once | INTRAVENOUS | Status: AC
  Administered 2021-07-29: 2 g via INTRAVENOUS
  Filled 2021-07-29: qty 50

## 2021-07-29 MED ORDER — SERTRALINE HCL 100 MG PO TABS
100.0000 mg | ORAL_TABLET | Freq: Every day | ORAL | Status: DC
  Administered 2021-08-01 – 2021-08-05 (×5): 100 mg via ORAL
  Filled 2021-07-29 (×5): qty 1

## 2021-07-29 MED ORDER — SERTRALINE HCL 50 MG PO TABS: 100.0000 mg | ORAL_TABLET | Freq: Every day | ORAL | Status: DC

## 2021-07-29 MED ORDER — SERTRALINE HCL 50 MG PO TABS
50.0000 mg | ORAL_TABLET | Freq: Every day | ORAL | Status: AC
  Administered 2021-07-29 – 2021-07-31 (×3): 50 mg via ORAL
  Filled 2021-07-29 (×3): qty 1

## 2021-07-29 MED ORDER — QUETIAPINE FUMARATE ER 50 MG PO TB24
50.0000 mg | ORAL_TABLET | Freq: Every day | ORAL | Status: DC
  Administered 2021-07-29 – 2021-08-04 (×7): 50 mg via ORAL
  Filled 2021-07-29 (×9): qty 1

## 2021-07-29 MED ORDER — INSULIN ASPART 100 UNIT/ML IJ SOLN
0.0000 [IU] | INTRAMUSCULAR | Status: DC
  Administered 2021-07-29 – 2021-08-02 (×3): 2 [IU] via SUBCUTANEOUS

## 2021-07-29 NOTE — PMR Pre-admission (Signed)
PMR Admission Coordinator Pre-Admission Assessment  Patient: Roy [name] is an 25 y.o., male MRN: [id number] DOB: 1996/02/23 Height: _0  (157.5 cm) Weight: 97.7 kg  Insurance Information Self pay - uninsured, no insurance.  Family are applying for medicaid per aunt  SECONDARY:        Policy#:       Phone#:    Development worker, community:        Phone#:    The Engineer, petroleum" for patients in Inpatient Rehabilitation Facilities with attached "Privacy Act [place] [facility] Records" was provided and verbally reviewed with: N/A  Emergency Contact Information Contact Information     Name Relation Home Work Mobile   [place] Significant other   [phone]       Current Medical History  Patient Admitting Diagnosis: B CVA, seizure  History of Present Illness:  A40 year old right-handed male with unremarkable past medical history on no prescription medications.  He does have a history of marijuana and alcohol use.  Per chart review he was living at his sister's house working at [employer].  He does have a girlfriend.  Presented 07/26/2021 unresponsive after having intercourse with his girlfriend requiring intubation for airway protection as well as noted seizure.  His fiance/girlfriend reports that patient had been having complaints of headache for 3 to 4 days.  Cranial CT scan negative.  CT angiogram of head and neck showed intimal irregularity with filling defect involving the proximal left vertebral artery, V1 segment concerning for acute arterial dissection.  Subtle irregular nonocclusive filling defect involving the distal basilar artery/basilar tip consistent with associated distal embolic disease.  Adjacent superior cerebellar arteries were not visualized.  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  Negative CT venogram no evidence for dural sinus thrombosis.   Cranial CT scan was again repeated showing no acute pathology.  He was initially loaded with Keppra.  EEG showed no seizure activity and Keppra has since been discontinued.  Admission chemistries potassium 2.7-3.0, glucose 112, WBC 19,000, lactic acid 4.4, alcohol level less than 10, ammonia level 58, urine drug screen positive for marijuana.  Patient did undergo distal embolization to top of the basilar status post IR with left LV stenting per interventional radiology.  Currently maintained on aspirin 81 mg daily as well as Brilinta 90 mg twice daily.  Subcutaneous Lovenox for DVT prophylaxis.  Initially with Cortrak feeding tube diet is since been advanced to regular.  Bouts of restlessness agitation monitor closely on Seroquel nightly.  Therapy evaluations completed due to patient decreased functional mobility and now to be admitted for a comprehensive inpatient rehab program.   Complete NIHSS TOTAL: 8  Patient's medical record from [facility] has been reviewed by the rehabilitation admission coordinator and physician.  Past Medical History  History reviewed. No pertinent past medical history.  Has the patient had major surgery during 100 days prior to admission? Yes  Family History   family history is not on file.  Current Medications  Current Facility-Administered Medications:    acetaminophen (TYLENOL) tablet 650 mg, 650 mg, Oral, Q4H PRN, 650 mg at 08/04/21 2208 **OR** acetaminophen (TYLENOL) 160 MG/5ML solution 650 mg, 650 mg, Per Tube, Q4H PRN **OR** acetaminophen (TYLENOL) suppository 650 mg, 650 mg, Rectal, Q4H PRN, Omar Person, NP   aspirin chewable tablet 81 mg, 81 mg, Oral, Daily, 81 mg at 08/05/21 1005 **OR** [DISCONTINUED] aspirin chewable tablet 81 mg, 81 mg, Oral, Daily,  Donnetta Simpers, MD, 81 mg at 07/31/21 0848   atorvastatin (LIPITOR) tablet 40 mg, 40 mg, Oral, QHS, Donnetta Simpers, MD, 40 mg at 08/04/21 2207   diphenhydrAMINE (BENADRYL) injection 12.5 mg,  12.5 mg, Intravenous, Once PRN, Donnetta Simpers, MD   docusate sodium (COLACE) capsule 100 mg, 100 mg, Oral, BID, Donnetta Simpers, MD, 100 mg at 08/05/21 1005   enoxaparin (LOVENOX) injection 40 mg, 40 mg, Subcutaneous, Q24H, Rosalin Hawking, MD, 40 mg at 08/04/21 1445   feeding supplement (ENSURE ENLIVE / ENSURE PLUS) liquid 237 mL, 237 mL, Oral, BID BM, Rosalin Hawking, MD, 237 mL at 88/41/66 0630   folic acid (FOLVITE) tablet 1 mg, 1 mg, Oral, Daily, Rosalin Hawking, MD, 1 mg at 08/05/21 1005   insulin aspart (novoLOG) injection 0-15 Units, 0-15 Units, Subcutaneous, TID WC, Rosalin Hawking, MD   labetalol (NORMODYNE) injection 20 mg, 20 mg, Intravenous, Q2H PRN, Dewaine Oats, Katalina M, NP, 20 mg at 07/27/21 1124   melatonin tablet 3 mg, 3 mg, Oral, QHS, Donnetta Simpers, MD, 3 mg at 08/04/21 2208   multivitamin with minerals tablet 1 tablet, 1 tablet, Oral, Daily, Donnetta Simpers, MD, 1 tablet at 08/05/21 1005   polyethylene glycol (MIRALAX / GLYCOLAX) packet 17 g, 17 g, Oral, Daily, Lorrin Goodell, Alferd Patee, MD, 17 g at 08/05/21 1005   QUEtiapine (SEROQUEL XR) 24 hr tablet 50 mg, 50 mg, Oral, QHS, Eubanks, Katalina M, NP, 50 mg at 08/04/21 2207   sertraline (ZOLOFT) tablet 100 mg, 100 mg, Oral, Daily, Leonie Man, Pramod S, MD, 100 mg at 08/05/21 1005   thiamine tablet 100 mg, 100 mg, Oral, Daily, Rosalin Hawking, MD, 100 mg at 08/05/21 1005   ticagrelor (BRILINTA) tablet 90 mg, 90 mg, Oral, BID, 90 mg at 08/05/21 1005 **OR** ticagrelor (BRILINTA) tablet 90 mg, 90 mg, Per Tube, BID, Omar Person, NP, 90 mg at 08/01/21 2202  Patients Current Diet:  Diet Order             Diet regular Room service appropriate? Yes; Fluid consistency: Thin  Diet effective now                   Precautions / Restrictions Precautions Precautions: Fall Precaution Comments: permissive HTN <220/120, R hemi, diplopia/visual deficits Restrictions Weight Bearing Restrictions: No   Has the patient had 2 or more falls or a  fall with injury in the past year? No  Prior Activity Level Community (5-7x/wk): Went out daily. Walked to the store.  Was not driving.  Prior Functional Level Self Care: Did the patient need help bathing, dressing, using the toilet or eating? Independent  Indoor Mobility: Did the patient need assistance with walking from room to room (with or without device)? Independent  Stairs: Did the patient need assistance with internal or external stairs (with or without device)? Independent  Functional Cognition: Did the patient need help planning regular tasks such as shopping or remembering to take medications? Independent  Patient Information Are you of Hispanic, Latino/a,or Spanish origin?: A. No, not of Hispanic, Latino/a, or Spanish origin What is your race?: B. Black or African American Do you need or want an interpreter to communicate with a doctor or health care staff?: 0. No  Patient's Response To:  Health Literacy and Transportation Is the patient able to respond to health literacy and transportation needs?: Yes Health Literacy - How often do you need to have someone help you when you read instructions, pamphlets, or other written material from your doctor or pharmacy?: Never  In the past 12 months, has lack of transportation kept you from medical appointments or from getting medications?: No In the past 12 months, has lack of transportation kept you from meetings, work, or from getting things needed for daily living?: No  Development worker, international aid / Equipment    Prior Device Use: Indicate devices/aids used by the patient prior to current illness, exacerbation or injury? None of the above  Current Functional Level Cognition  Arousal/Alertness: Awake/alert Overall Cognitive Status: Impaired/Different from baseline Current Attention Level: Sustained Orientation Level: Oriented to person Following Commands: Follows one step commands consistently, Follows one step commands with  increased time Safety/Judgement: Decreased awareness of safety, Decreased awareness of deficits General Comments: Pt follows one step commands consistently with increased time; can be slightly impulsive Attention: Sustained Sustained Attention: Impaired Sustained Attention Impairment: Verbal basic Memory: Impaired Awareness: Impaired Awareness Impairment: Anticipatory impairment, Intellectual impairment Problem Solving: Appears intact Executive Function: Self Monitoring, Self Correcting Self Monitoring: Appears intact Self Correcting: Appears intact    Extremity Assessment (includes Sensation/Coordination)  Upper Extremity Assessment: Generalized weakness, RUE deficits/detail, LUE deficits/detail RUE Deficits / Details: decreased awareness of position in space, L worse than R, grasp 3+/5, low attention and fatigues quickly RUE Sensation: decreased proprioception RUE Coordination: decreased fine motor, decreased gross motor LUE Deficits / Details: decreased awareness of position in space, L worse than R, grasp 3+/5, low attention and fatigues quickly LUE Sensation: decreased proprioception LUE Coordination: decreased fine motor, decreased gross motor  Lower Extremity Assessment: Defer to PT evaluation    ADLs  Overall ADL's : Needs assistance/impaired Eating/Feeding: Moderate assistance, Sitting Eating/Feeding Details (indicate cue type and reason): practiced with built up spoon this session wtih RUE Grooming: Wash/dry face, Maximal assistance, Standing Grooming Details (indicate cue type and reason): pt required assist to grasp rag and bring to face, pt able to wipe in downwad motion. Need further assist for hand over hand to be thorough Upper Body Bathing: Maximal assistance, Bed level Upper Body Bathing Details (indicate cue type and reason): Had pt use LUE to bathe chest with hand over hand and while using RUE to dependently control LUE Lower Body Bathing: Maximal  assistance Upper Body Dressing : Moderate assistance Lower Body Dressing: Maximal assistance Toilet Transfer: Maximal assistance, +2 for physical assistance, +2 for safety/equipment Toilet Transfer Details (indicate cue type and reason): use of stedy Toileting- Clothing Manipulation and Hygiene: Total assistance, Bed level Toileting - Clothing Manipulation Details (indicate cue type and reason): complete peri hygiene at bed level in sidelying position, pt able to sustain sidelying while holding bed rail Functional mobility during ADLs: Moderate assistance (bed level only this session) General ADL Comments: pt with improved awareness and attention to LUE; ataxic movements throughout. Pt in great spirits, singing and joking thourghout session    Mobility  Overal bed mobility: Needs Assistance Bed Mobility: Rolling, Sidelying to Sit Rolling: Mod assist Sidelying to sit: Mod assist, HOB elevated, +2 for safety/equipment Supine to sit: +2 for physical assistance, Mod assist, +2 for safety/equipment, HOB elevated Sit to supine: Max assist, +2 for physical assistance, +2 for safety/equipment Sit to sidelying: Mod assist General bed mobility comments: mod A to roll with verbal cues and increased time to grasp bed rail and release, HOB slightly elevated to assist with transition, assist with BLE management    Transfers  Overall transfer level: Needs assistance Equipment used:  (EVA walker) Transfers: Sit to/from Stand Sit to Stand: Mod assist, +2 physical assistance General transfer comment: deferred today  due to abdominal pain and pt requesting to lie back down    Ambulation / Gait / Stairs / Wheelchair Mobility  Ambulation/Gait Pre-gait activities: Sidesteps along EOB x 3-4 with +2 mod to maxA for sidesteps    Posture / Balance Dynamic Sitting Balance Sitting balance - Comments: Initially required constant min assist to sit EOB with cues to lean forward, with weight shifting able to sit  upright with minG assist Balance Overall balance assessment: Needs assistance Sitting-balance support: Feet supported Sitting balance-Leahy Scale: Fair Sitting balance - Comments: Initially required constant min assist to sit EOB with cues to lean forward, with weight shifting able to sit upright with minG assist Postural control: Posterior lean Standing balance support: Bilateral upper extremity supported, Reliant on assistive device for balance Standing balance-Leahy Scale: Poor Standing balance comment: deferred today due to abdominal pain    Special needs/care consideration Skin Right groin dressing and Behavioral consideration tearful and anxious.   Previous Home Environment (from acute therapy documentation) Additional Comments: lives at sister house and working at [employer] but due to expressive deficits unable to fully obtain PTA living arrangements. pt becoming liable  Discharge Living Setting Plans for Discharge Living Setting: House, Lives with (comment) (Lives with his sister.) Type of Home at Discharge: House Discharge Home Layout: One level Discharge Home Access: Level entry Discharge Bathroom Shower/Tub: Walk-in shower, Curtain Discharge Bathroom Toilet: Standard Discharge Bathroom Accessibility: Yes How Accessible: Accessible via walker Does the patient have any problems obtaining your medications?: Yes (Describe) (He has no insurance.  Aunt says they are applying for medicaid.)  Social/Family/Support Systems Patient Roles: Other (Comment) (Has 2 aunts, girlfriend, sister, other family) Contact Information: Nicky Pugh - aunt - [phone] and Charlesetta Garibaldi - aunt - [phone] Anticipated Caregiver: Sister, grilfriend and others Anticipated Caregiver's Contact Information: Princella Ion - girlfriend - [phone] Ability/Limitations of Caregiver: sister is not currently working.  Has 2 aunts and other family to assist after discharge. Caregiver Availability:  24/7 Discharge Plan Discussed with Primary Caregiver: Yes Is Caregiver In Agreement with Plan?: Yes Does Caregiver/Family have Issues with Lodging/Transportation while Pt is in Rehab?: No  Goals Patient/Family Goal for Rehab: PT/OT/SLP supervision to minguard assist goals Expected length of stay: 14-18 days Pt/Family Agrees to Admission and willing to participate: Yes Program Orientation Provided & Reviewed with Pt/Caregiver Including Roles  & Responsibilities: Yes  Decrease burden of Care through IP rehab admission: N/A  Possible need for SNF placement upon discharge: Not planned  Patient Condition: I have reviewed medical records from [facility], spoken with CM, and patient and family member. I met with patient at the bedside for inpatient rehabilitation assessment.  Patient will benefit from ongoing PT, OT, and SLP, can actively participate in 3 hours of therapy a day 5 days of the week, and can make measurable gains during the admission.  Patient will also benefit from the coordinated team approach during an Inpatient Acute Rehabilitation admission.  The patient will receive intensive therapy as well as Rehabilitation physician, nursing, social worker, and care management interventions.  Due to bladder management, bowel management, safety, skin/wound care, disease management, medication administration, pain management, and patient education the patient requires 24 hour a day rehabilitation nursing.  The patient is currently mod to max assist with mobility and basic ADLs.  Discharge setting and therapy post discharge at home with home health is anticipated.  Patient has agreed to participate in the Acute Inpatient Rehabilitation Program and will admit today.  Preadmission Screen Completed By:  Retta Diones, 08/05/2021 10:26 AM ______________________________________________________________________   Discussed status with Dr. Dagoberto Ligas and Dr. Naaman Plummer on 08/05/21 at 0945 and received approval for  admission today.  Admission Coordinator:  Retta Diones, RN, time 1026/Date 08/05/21   Assessment/Plan: Diagnosis: Does the need for close, 24 hr/day Medical supervision in concert with the patient's rehab needs make it unreasonable for this patient to be served in a less intensive setting? 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 Yes Does the patient require coordinated care of a physician, rehab nurse, PT, OT, and SLP to address physical and functional deficits in the context of the above medical diagnosis(es)? Yes Addressing deficits in the following areas: balance, endurance, locomotion, strength, transferring, bowel/bladder control, bathing, dressing, feeding, grooming, toileting, cognition, speech, and language Can the patient actively participate in an intensive therapy program of at least 3 hrs of therapy 5 days a week? Yes The potential for patient to make measurable gains while on inpatient rehab is good Anticipated functional outcomes upon discharge from inpatient rehab: supervision and min assist PT, supervision and min assist OT, supervision and min assist SLP Estimated rehab length of stay to reach the above functional goals is: 14-18 days Anticipated discharge destination: Home 10. Overall Rehab/Functional Prognosis: good   MD Signature:

## 2021-07-29 NOTE — Progress Notes (Signed)
In theSTROKE TEAM PROGRESS NOTE   INTERVAL HISTORY Patient is resting in bed.  He appears emotionally labile.  He is on and girlfriend around the bedside.  Neurological exam is unchanged.  He is awake alert interactive.  He is dysarthric.  He has right gaze palsy and right facial weakness and right hemiparesis.  Vital signs are stable.   Patient has prior history of depression but has not been on any antidepressants. Vitals:   07/29/21 1148 07/29/21 1200 07/29/21 1300 07/29/21 1400  BP:  129/73 131/84 114/82  Pulse:  82 92 69  Resp:  (!) 26 (!) 29 17  Temp: 100.1 F (37.8 C)     TempSrc: Axillary     SpO2:  98% 98% 98%  Weight:      Height:       CBC:  Recent Labs  Lab 07/28/21 0519 07/29/21 0647  WBC 11.6* 10.3  NEUTROABS 8.4* 6.8  HGB 11.5* 11.4*  HCT 34.1* 34.0*  MCV 91.4 90.2  PLT 313 334   Basic Metabolic Panel:  Recent Labs  Lab 07/28/21 0519 07/28/21 1646 07/29/21 0647  NA 137  --  134*  K 3.2*  --  4.1  CL 106  --  103  CO2 23  --  23  GLUCOSE 96  --  104*  BUN 7  --  9  CREATININE 0.87  --  0.77  CALCIUM 8.3*  --  8.9  MG  --  2.0 1.9  PHOS  --  2.3* 2.8    Lipid Panel:  Recent Labs  Lab 07/28/21 0519  CHOL 199  TRIG 230*  HDL 37*  CHOLHDL 5.4  VLDL 46*  LDLCALC 116*     [id number]:  Recent Labs  Lab 07/27/21 0500  HGBA1C 5.7*   Urine Drug Screen:  Recent Labs  Lab 07/27/21 0606  LABOPIA NONE DETECTED  COCAINSCRNUR NONE DETECTED  LABBENZ NONE DETECTED  AMPHETMU NONE DETECTED  THCU POSITIVE*  LABBARB NONE DETECTED    Alcohol Level  Recent Labs  Lab 07/27/21 0031  ETH <10    IMAGING past 24 hours DG Swallowing Func-Speech Pathology  Result Date: 07/29/2021 Table formatting from the original result was not included. Objective Swallowing Evaluation: Type of Study: MBS-Modified Barium Swallow Study  Patient Details Name: Roy [name] MRN: [id number] Date of Birth: 05-Dec-1995 Today's Date: 07/29/2021 Time: SLP Start Time (ACUTE  ONLY): 1330 -SLP Stop Time (ACUTE ONLY): 1346 SLP Time Calculation (min) (ACUTE ONLY): 16 min Past Medical History: No past medical history on file. Past Surgical History: Past Surgical History: Procedure Laterality Date  IR ANGIO EXTRACRAN SEL COM CAROTID INNOMINATE UNI R MOD SED  07/27/2021  IR CT HEAD LTD  07/27/2021  IR INTRA CRAN STENT  07/27/2021  IR PERCUTANEOUS ART THROMBECTOMY/INFUSION INTRACRANIAL INC DIAG ANGIO  07/27/2021  RADIOLOGY WITH ANESTHESIA N/A 07/27/2021  Procedure: IR WITH ANESTHESIA;  Surgeon: Julieanne Cotton, MD;  Location: [facility] OR;  Service: Radiology;  Laterality: N/A; HPI: 25 y.o M presenting with acute onset seizures after having significant headaches for three days and intake of alcohol and marajuana. Imaging revealed Acute infarcts involving the right thalamus, medial aspect of the  right occipital and temporal lobes, including right hippocampus,  centrally in the midbrain, on the left side of the pons and in the  bilateral cerebellar hemispheres. Petechial hemorrhage seen in the  left inferior cerebellar hemisphere right occipital lobe. Intubated 11/7-11/8  Subjective: Pt awake and alert, cheerfully greeting each member of the  careteam  Recommendations for follow up therapy are one component of a multi-disciplinary discharge planning process, led by the attending physician.  Recommendations may be updated based on patient status, additional functional criteria and insurance authorization. Assessment / Plan / Recommendation Clinical Impressions 07/29/2021 Clinical Impression Pt was seen for an MBS. Overall, pt presents with mild oral dysphagia d/t neuromuscular deficits resulting in anterior oral spillage and weak lingual manipulation. Pharyngeal phase WFL across all consistencies. Pt drooling from right side before test began and presents with mild lingual edema. With thin liquid from cup, pt exhibited mild anterior oral spillage from right side which was mitigated with administration  through straw. Pt exhibited no oral phase impairments with puree and only mildly prolonged mastication with regular solid. Pt also exhibited mild oral residue coating tongue across consistencies. With pill with thin liquid, pt exhibited anterior oral spillage of thin liquid and weak lingual manipulation, unable to propel pill to posterior oral cavity. Weak lingual manipulation also observed with pill administered in puree, resulting in pill expectoration. Pt care partner reports pt cannot take whole pills at baseline. Recommend regular/thin liquid diet, administering medications crushed in puree. SLP will continue to follow pt acutely for management of diet tolerance. SLP Visit Diagnosis Dysphagia, oral phase (R13.11) Attention and concentration deficit following -- Frontal lobe and executive function deficit following -- Impact on safety and function Mild aspiration risk   Treatment Recommendations 07/29/2021 Treatment Recommendations Therapy as outlined in treatment plan below   Prognosis 07/29/2021 Prognosis for Safe Diet Advancement Good Barriers to Reach Goals Severity of deficits Barriers/Prognosis Comment -- Diet Recommendations 07/29/2021 SLP Diet Recommendations Regular solids;Thin liquid Liquid Administration via Cup;Straw Medication Administration Crushed with puree Compensations Minimize environmental distractions;Slow rate Postural Changes Remain semi-upright after after feeds/meals (Comment);Seated upright at 90 degrees   Other Recommendations 07/29/2021 Recommended Consults -- Oral Care Recommendations Oral care BID Other Recommendations -- Follow Up Recommendations Acute inpatient rehab (3hours/day) Assistance recommended at discharge PRN Functional Status Assessment Patient has had a recent decline in their functional status and demonstrates the ability to make significant improvements in function in a reasonable and predictable amount of time. Frequency and Duration  07/29/2021 Speech Therapy  Frequency (ACUTE ONLY) min 2x/week Treatment Duration 2 weeks   Oral Phase 07/29/2021 Oral Phase Impaired Oral - Pudding Teaspoon -- Oral - Pudding Cup -- Oral - Honey Teaspoon -- Oral - Honey Cup -- Oral - Nectar Teaspoon -- Oral - Nectar Cup -- Oral - Nectar Straw -- Oral - Thin Teaspoon -- Oral - Thin Cup Right anterior bolus loss Oral - Thin Straw WFL Oral - Puree WFL Oral - Mech Soft -- Oral - Regular WFL Oral - Multi-Consistency -- Oral - Pill Weak lingual manipulation;Right anterior bolus loss Oral Phase - Comment --  Pharyngeal Phase 07/29/2021 Pharyngeal Phase WFL Pharyngeal- Pudding Teaspoon -- Pharyngeal -- Pharyngeal- Pudding Cup -- Pharyngeal -- Pharyngeal- Honey Teaspoon -- Pharyngeal -- Pharyngeal- Honey Cup -- Pharyngeal -- Pharyngeal- Nectar Teaspoon -- Pharyngeal -- Pharyngeal- Nectar Cup -- Pharyngeal -- Pharyngeal- Nectar Straw -- Pharyngeal -- Pharyngeal- Thin Teaspoon -- Pharyngeal -- Pharyngeal- Thin Cup -- Pharyngeal -- Pharyngeal- Thin Straw -- Pharyngeal -- Pharyngeal- Puree -- Pharyngeal -- Pharyngeal- Mechanical Soft -- Pharyngeal -- Pharyngeal- Regular -- Pharyngeal -- Pharyngeal- Multi-consistency -- Pharyngeal -- Pharyngeal- Pill -- Pharyngeal -- Pharyngeal Comment --  No flowsheet data found. Royce Macadamia 07/29/2021, 3:39 PM  PHYSICAL EXAM [name] is not in distress.. . Afebrile. Head is nontraumatic. Neck is supple without bruit.    Cardiac exam no murmur or gallop. Lungs are clear to auscultation. Distal pulses are well felt. . Neurological Exam :    Mental status/Cognition: Awake alert.  Oriented to time place and person.  Follows commands well. Speech/language: Moderate dysarthria but can be understood.  Voice is slightly hypophonic.  Follows commands well. Cranial nerves/brainstem: Pupils 9mm, equal round and reactive to light.  Right gaze palsy with subjective diplopia.  Disconjugate horizontal gaze.  Preserved  vertical gaze.  No nystagmus Moderate right lower facial weakness.  Tongue midline.   Motor:  Right hemiparesis with right upper extremity 3/5 strength with distal hand weakness.  Right lower extremity 2/5 strength.  Left upper extremity 4/5 strength with good grip.  Left lower extremity 4/5 strength.  Tone is normal. Sensation:  Diminished touch pinprick sensation in the right face and upper and lower extremities.   Coordination/Complex Motor:  Impaired on the right proportionate to the weakness.  Normal on the left  ASSESSMENT/PLAN Roy [name] is a 26 y.o. male with  no significant PMH who presents with shaking and unresponsiveness. Patient is intubated and unable to provide any history. Most of the history obtained from sister and fiance over phone. Per fiance, he has been reporting that his head hurts for the last 3-4 days along with feeling cold. Today, they smoked marijuana and he took 4 shots of Vodka. They were having intercourse when he fell out and the left side of his body went numb. He was sweating profusely and kept saying "my brain" and then started having seizures. This happened about 30 mins to an hours after taking marijuana. No prior hx of seizures. He has been reporting headache for the last 3-4 days. Today started throwing up. He threw up a lot today. Spends a lot of time in a shed but comes to sister's home every night to sleep. The shed does not have proper ventilation, it is full of stuff but nothing toxic that sister or fiance is aware of. No obvious exposure to Carbon Monoxide. No fever, no chills but has been feeling cold.   The fiance immediately called EMS for concern for seizure like activity. Per notes, the seizure like activity stopped enroute by the time EMS got an IV so he was not given any Versed. It resumed in the ED and he was given ativan 4mg , Keppra 2G in the ED with persistent shaking and was intubated with rocuronium and etomidate and started on  propofol.   On my evaluation, he has tongue movements and twitching along with jerks mostly in the LUE and RLE. Jerking appears arhythmic on my evaluation and ?myoclonic.   No recent water fasts, he is not a regular drinker, has used marijuana in the past without any issues. Fiance and another friend also smoked the same marijuana without any issues. He was fine all day otherwise. No recent head injury with loss of consciousness, no family history of seizures.  CT head was negative for a large hypodensity concerning for a large territory infarct or hyperdensity concerning for an ICH.  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 In addition, imaging notable for a distal basilar thrombus, R PCA P2 occlusion and Left PCA P1 occlusion. A code stroke was initiated and case discussed with Dr. for evaluation for potential thrombectomy. Dr. Corliss Skains discussed  risks and benefits and the need for thrombectomy with patient's sister Fabio Asa over the phone with the fiance listening in over the speaker. The sister consented for the surgery and he was taken to IR for a stent assisted angioplasty of proximal Left VA.     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   Intermittent involuntary movements which appear to be stimulus sensitive myoclonus versus tremors of unclear etiology which have resolved.  Doubt seizures.  Code Stroke  CT head No acute abnormality.  Small vessel disease. Atrophy.  ASPECTS 10.     CTA head & neck   1. 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 2. Subtle irregular nonocclusive filling defect involving the distal basilar  artery/basilar tip, consistent with associated distal embolic disease. Adjacent superior cerebellar arteries are not visualized at their origins, and could be occluded. 3. Associated partially occlusive thrombus with severe stenosis involving the left P1 segment. 4. Additional distal right P2 embolic occlusion. 5. Wide patency of both carotid artery systems and anterior circulation. 6. Negative CT venogram.  No evidence for dural sinus thrombosis.  Cerebral angio  S/P bilateral vert  artery and RT common carotid arteriograms . RT CFA approach. S/P revascularization of occuded distal basilar artery and both PCAs with x1 pass with 40 mm solitaireX retriever and contact  Aspiration achieving a TICI 2C revascularization.  Mechanical thrombectomy stent assisted angioplasty for symptomatic prox LT VA  Dissection with distal protection.   Post IR CT  No acute intracranial pathology. No evidence of intracranial hemorrhage. EEG.  Excessive beta activity.  No seizures MRI multifocal infarcts involving bilateral superior cerebellum, left pons, central midbrain, right thalamus, medial temporal and occipital lobes  2D Echo EF 60-65%, normal LA, no IA shunt   LDL No results found for requested labs within last 95638 hours. HgbA1c 5.7 VTE prophylaxis - scd EEG: within normal limits. No seizures or epileptiform activity noted.     Diet   Diet regular Room service appropriate? Yes; Fluid consistency: Thin   No antithrombotic prior to admission, now on aspirin 81 mg daily and Brilinta (ticagrelor) 90 mg bid.   Therapy recommendations:  CLR Disposition:  pending   Hypertension Home meds:   none Unstable Permissive hypertension (OK if < 220/120) but gradually normalize in 5-7 days Long-term BP goal normotensive  Hyperlipidemia Home meds:  none LDL No results found for requested labs within last 75643 hours., goal < 70 Add lipitor 40mg    High intensity statin   Continue statin at  discharge  HgbA1c 5.7, goal < 7.0 CBGs Recent Labs    07/29/21 0727 07/29/21 1054 07/29/21 1535  GLUCAP 123* 104* 125*    SSI  Other Stroke Risk Factors   Cigarette smoker     Substance abuse - UDS:  THC POSITIVE, Cocaine NONE DETECTED. Patient advised to stop using due to stroke risk.  Obesity, Body mass index is 42.38 kg/m., BMI >/= 30 associated with increased stroke risk, recommend weight loss, diet and exercise as appropriate   Hx stroke/TIA    Other Active Problems Respiratory failure   Hospital day # 3 Continue mobilization out of bed and ongoing therapies.  Patient appears emotionally requiring likely has underlying depression hence will start antidepressant.  Transfer to neurology floor bed when  available.  Continue aspirin and Brilinta for left vertebral artery stent.  Discussed with his girlfriend r and aunt at the bedside and answered questions.  Discussed with Dr. Corliss Skains and Dr. Merrily Pew critical care medicine. This patient is critically ill and at significant risk of neurological worsening, death and care requires constant monitoring of vital signs, hemodynamics,respiratory and cardiac monitoring, extensive review of multiple databases, frequent neurological assessment, discussion with family, other specialists and medical decision making of high complexity.I have made any additions or clarifications directly to the above note.This critical care time does not reflect procedure time, or teaching time or supervisory time of PA/NP/Med Resident etc but could involve care discussion time.  I spent 35 minutes of neurocritical care time  in the care of  this patient.     Delia Heady, MD Medical Director [facility] Pager: [phone] 07/29/2021 4:05 PM     To contact Stroke Continuity provider, please refer to [email or web address]. After hours, contact General Neurology

## 2021-07-29 NOTE — Progress Notes (Signed)
Speech Language Pathology Treatment: Dysphagia;Cognitive-Linquistic  Patient Details Name: Roy [name] MRN: [id number] DOB: [birth year] Today's Date: 07/29/2021 Time: 4481-8563 SLP Time Calculation (min) (ACUTE ONLY): 21 min  Assessment / Plan / Recommendation Clinical Impression  Pt was seen for therapy targeting swallowing, speech intelligibility, and cognitive goals. Overall, pt alertness and cognitive status improved from previous targeted session. SLP completed oral care and trialed ice chips, thin liquid from teaspoon and cup, and puree solids. Pt exhibited mild anterior spillage with thin liquid and puree, however oral phase mostly functional. Pharyngeally, pt exhibited immediate throat clear with thin liquids from cup and puree solids, suggesting probable penetration/aspiration. SLP will follow up with MBS this date to objectively view swallow function. Recommend NPO, administering medications via alternative means until results from [facility] are rendered.   Cognitively, pt oriented to person, place, and situation, with improving but still inaccurate orientation to time. Pt now oriented to month but required cues for date and day of the week. Pt less labile today and often expressed negative thoughts ("they don't care about me", etc.), however verbal cues effective for quick redirection. Pt memory improving per girlfriend, and pt described current and past work responsibilities independently to SLP. Speech intelligibility improved from previous session (~50-60% from <25%); pt still benefiting from frequent verbal cues to utilize slow rate of speech at the conversation level. Pt exhibiting use of over-articulation independently when provided non-verbal and verbal cues that conversation partner did not understand. SLP will continue to follow pt for therapy targeting orientation, awareness, and compensatory strategies for speech intelligibility.    HPI HPI: 25 y.o M presenting with acute onset  seizures after having significant headaches for three days and intake of alcohol and marajuana. Imaging revealed Acute infarcts involving the right thalamus, medial aspect of the  right occipital and temporal lobes, including right hippocampus,  centrally in the midbrain, on the left side of the pons and in the  bilateral cerebellar hemispheres. Petechial hemorrhage seen in the  left inferior cerebellar hemisphere right occipital lobe. Intubated 11/7-11/8      SLP Plan  Continue with current plan of care;MBS      Recommendations for follow up therapy are one component of a multi-disciplinary discharge planning process, led by the attending physician.  Recommendations may be updated based on patient status, additional functional criteria and insurance authorization.    Recommendations  Diet recommendations: NPO Medication Administration: Via alternative means                General recommendations: Rehab consult Oral Care Recommendations: Oral care QID Follow Up Recommendations: Acute inpatient rehab (3hours/day) Assistance recommended at discharge: PRN SLP Visit Diagnosis: Dysarthria and anarthria (R47.1);Cognitive communication deficit [id number]) Plan: Continue with current plan of care;MBS       GO              Jeannie Done, SLP-Student   Jeannie Done  07/29/2021, 10:25 AM

## 2021-07-29 NOTE — Progress Notes (Signed)
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 No significant PMH   OT comments  Pt progressing towards OT goals this session, very motivated, very labile and emotional throughout session. Emotions better controlled when given something concrete to do (even if it is just saying something positive after coming to standing). Max A for seated grooming task (washing face) with cues for visual attention, grasp, and eventually hand over hand for task completion. Pt able to complete 6 sit<>stand in the stedy. Girlfriend and Aunt present throughout session and very engaged and supportive. OT will continue to follow acutely, CIR remains essential to maximize safety and independence in ADL and functional transfers.    Recommendations for follow up therapy are one component of a multi-disciplinary discharge planning process, led by the attending physician.  Recommendations may be updated based on patient status, additional functional criteria and insurance authorization.    Follow Up Recommendations  Acute inpatient rehab (3hours/day)    Assistance Recommended at Discharge Intermittent Supervision/Assistance  Equipment Recommendations  BSC/3in1;Wheelchair (measurements OT);Wheelchair cushion (measurements OT)    Recommendations for Other Services Rehab consult    Precautions / Restrictions Precautions Precautions: Fall Precaution Comments: BP <140 Restrictions Weight Bearing Restrictions: No       Mobility Bed Mobility Overal bed mobility: Needs Assistance Bed Mobility: Supine to Sit;Sit to Supine     Supine to sit: +2 for physical assistance;Mod assist;+2 for safety/equipment;HOB  elevated Sit to supine: +2 for physical assistance;Total assist;+2 for safety/equipment   General bed mobility comments: pt needs (A) to progress bil LE and core toward EOB. pt once on R side able to push up into sitting min (A). pt sitting min - mod (A) initially. use of bed pad to bring hips EOB. pt unable to touch floor with bil LE due to height. pt requires +2 total (A) to return to supine    Transfers Overall transfer level: Needs assistance Equipment used: Ambulation equipment used (STEDY) Transfers: Sit to/from Stand Sit to Stand: Max assist;Mod assist;+2 physical assistance;+2 safety/equipment;From elevated surface           General transfer comment: use of stedy to safely facilitate sit<>stand x5. initially requiring max A, progressing to mod A +2. Pt did well when he was given expectations for standing. "This time we are going to stand for 10 seconds...count with Roy [name]" and could become labile - did better when announcing something positive in standing to assist with emotional regulation - which impacts his physical performance     Balance Overall balance assessment: Needs assistance Sitting-balance support: Bilateral upper extremity supported;Feet supported Sitting balance-Leahy Scale: Poor Sitting balance - Comments: at least min A sitting EOB   Standing balance support: Bilateral upper extremity supported;Reliant on assistive device for balance Standing balance-Leahy Scale: Poor                             ADL either performed or assessed with clinical judgement   ADL Overall ADL's : Needs assistance/impaired     Grooming: Wash/dry face;Maximal assistance;Sitting Grooming Details (indicate cue type and reason): cues to look at wash cloth to find visually, cues to grasp (unable) and hand over hand required to eventually wash  face Upper Body Bathing: Maximal assistance               Toilet Transfer: Maximal assistance;+2 for physical assistance;+2 for  safety/equipment Toilet Transfer Details (indicate cue type and reason): use of stedy Toileting- Clothing Manipulation and Hygiene: Total assistance       Functional mobility during ADLs: Maximal assistance;+2 for physical assistance;+2 for safety/equipment;Moderate assistance (stedy)      Extremity/Trunk Assessment Upper Extremity Assessment Upper Extremity Assessment: Generalized weakness;RUE deficits/detail;LUE deficits/detail RUE Deficits / Details: decreased awareness of position in space, L worse than R, grasp 3+/5, low attention and fatigues quickly RUE Sensation: decreased proprioception RUE Coordination: decreased fine motor;decreased gross motor LUE Deficits / Details: decreased awareness of position in space, L worse than R, grasp 3+/5, low attention and fatigues quickly LUE Sensation: decreased proprioception LUE Coordination: decreased fine motor;decreased gross motor   Lower Extremity Assessment Lower Extremity Assessment: Defer to PT evaluation        Vision   Vision Assessment?: Yes Eye Alignment: Impaired (comment) Alignment/Gaze Preference: Other (comment) (requires cues to move eyes to find objects in room - uses peripheral vision untli cued to use central vision) Diplopia Assessment:  (denies today) Depth Perception: Undershoots Additional Comments: continue to assess   Perception Perception Perception: Impaired   Praxis Praxis Praxis: Impaired Praxis Impairment Details: Initiation;Ideomotor    Cognition Arousal/Alertness: Awake/alert Behavior During Therapy: Anxious (Labile) Overall Cognitive Status: Impaired/Different from baseline Area of Impairment: Attention;Safety/judgement;Following commands;Awareness;Problem solving                   Current Attention Level: Sustained   Following Commands: Follows one step commands inconsistently Safety/Judgement: Decreased awareness of safety;Decreased awareness of deficits Awareness:  Intellectual Problem Solving: Slow processing;Decreased initiation;Difficulty sequencing;Requires tactile cues;Requires verbal cues General Comments: Pt labile throughout session, girlfriend "Roy [name]" present and has calming effect on Pt. [name] easily re-directed and responds well to very direct commands, L inattention and cues for visual gaze          Exercises     Shoulder Instructions       General Comments BP WFL this session, HR around 100 throughout session, SpO2 >95 on RA. RR increased with anxiety, but easily redirected.    Pertinent Vitals/ Pain       Pain Assessment: No/denies pain Faces Pain Scale: Hurts a little bit Pain Location: generalized Pain Descriptors / Indicators: Discomfort Pain Intervention(s): Monitored during session;Repositioned  Home Living                                          Prior Functioning/Environment              Frequency  Min 2X/week        Progress Toward Goals  OT Goals(current goals can now be found in the care plan section)  Progress towards OT goals: Progressing toward goals  Acute Rehab OT Goals Patient Stated Goal: get better OT Goal Formulation: With patient/family Time For Goal Achievement: 08/11/21 Potential to Achieve Goals: Good  Plan Discharge plan remains appropriate;Frequency remains appropriate    Co-evaluation    PT/OT/SLP Co-Evaluation/Treatment: Yes Reason for Co-Treatment: Necessary to address cognition/behavior during functional activity;For patient/therapist safety;To address functional/ADL transfers PT goals addressed during session: Mobility/safety with mobility;Balance;Proper use of DME;Strengthening/ROM OT goals addressed during session: ADL's and self-care;Strengthening/ROM;Proper use of Adaptive equipment and DME      AM-PAC  OT "6 Clicks" Daily Activity     Outcome Measure   Help from another person eating meals?: A Lot Help from another person taking care of personal  grooming?: A Lot Help from another person toileting, which includes using toliet, bedpan, or urinal?: A Lot Help from another person bathing (including washing, rinsing, drying)?: A Lot Help from another person to put on and taking off regular upper body clothing?: A Lot Help from another person to put on and taking off regular lower body clothing?: A Lot 6 Click Score: 12    End of Session Equipment Utilized During Treatment: Gait belt;Other (comment) Antony Salmon)  OT Visit Diagnosis: Unsteadiness on feet (R26.81);Muscle weakness (generalized) (M62.81);Cognitive communication deficit (R41.841) 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

## 2021-07-29 NOTE — Progress Notes (Signed)
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 Patient agitated, says a few words but difficult to understand, states his legs hurt. Per significant other she thinks he is trying to talk but gets overwhelmed and that makes him agitated.  Allergies: Patient has no known allergies.  Medications: Prior to Admission medications   Not on File     Vital Signs: BP 125/77   Pulse 94   Temp 98.6 F (37 C) (Oral)   Resp (!) 21   Ht  (1.575 m)   Wt 231 lb 11.3 oz (105.1 kg)   SpO2 99%   BMI 42.38 kg/m   Physical Exam Vitals and nursing note reviewed.  HENT:     Head: Normocephalic.  Cardiovascular:     Rate and Rhythm: Normal rate.  Pulmonary:     Effort: Pulmonary effort is normal.  Abdominal:     Comments: (+) NG in right nare  Skin:    General: Skin is warm and dry.  Neurological:     Mental Status: He is alert.  Alert, awake - unable to assess orientation 2/2 dysarthria Speech is dysarthric, comprehension intact PERRL bilaterally Visual fields grossly in tact No obvious facial asymmetry. Spontaneously moving all 4 extremities   Imaging: CT ANGIO HEAD W OR WO CONTRAST  Result Date: 07/27/2021 CLINICAL DATA:  Initial evaluation for acute altered mental status, evaluate for basilar thrombus. EXAM: CT ANGIOGRAPHY HEAD AND NECK WITH CONTRAST CT VENOGRAM HEAD WITH CONTRAST TECHNIQUE: Multidetector CT imaging of the head and neck was performed using the standard protocol during bolus administration of intravenous contrast. Multiplanar CT image reconstructions and MIPs were obtained  to evaluate the vascular anatomy. Carotid stenosis measurements (when applicable) are obtained utilizing NASCET criteria, using the distal internal carotid diameter as the denominator. CONTRAST:  60mL OMNIPAQUE IOHEXOL 350 MG/ML SOLN COMPARISON:  Prior noncontrast head CT from 07/26/2021./ FINDINGS: CTA NECK FINDINGS Aortic arch: Visualized aortic arch normal in caliber with normal branch pattern. No stenosis or other abnormality about the origin of the great vessels. Right carotid system: Right common and internal carotid arteries widely patent without stenosis, dissection or occlusion. Left carotid system: Left common and internal carotid arteries widely patent without stenosis, dissection or occlusion. Vertebral arteries: Both vertebral arteries arise from the subclavian arteries. No proximal subclavian artery stenosis. Right vertebral artery widely patent within the neck without stenosis or other abnormality. On the left, there is intimal irregularity with intraluminal filling defect involving the proximal left V1 segment, concerning for acute dissection (series 7, image 130). Area of involvement begins at or just beyond the origin, and extends approximately 2.3 cm in length. Associated moderate to severe stenosis proximally (series 5, image 271). Distally, the left vertebral artery is otherwise widely patent to the skull base. Skeleton: No visible acute osseous finding. No discrete or worrisome osseous lesions. Other neck: No other acute soft tissue abnormality within the neck. Endotracheal and enteric tubes in place. Upper chest: Mild atelectatic changes noted dependently within the visualized lungs. Visualized upper chest demonstrates no other acute finding. Review of the MIP images confirms the above findings CTA HEAD FINDINGS Anterior circulation:  Both internal carotid arteries widely patent to the termini without stenosis. A1 segments widely patent. Normal anterior communicating artery complex. Both  anterior cerebral arteries widely patent to their distal aspects without stenosis. No M1 stenosis or occlusion. Normal MCA bifurcations. Distal MCA branches well perfused and symmetric. Posterior circulation: Both V4 segments patent to the vertebrobasilar junction without stenosis or other abnormality. Both PICA origins patent and normal. Basilar widely patent proximally. There is some a subtle irregular filling defect involving the distal basilar artery/basilar tip, concerning for intraluminal thrombus (series 7, image 128). Secondary mild to moderate narrowing at the basilar tip. Neither superior cerebellar artery well visualized at the origin., and could be occluded. Irregular partially occlusive filling defect seen involving the left P1 segment with resultant moderate to severe stenosis (series 7, image 129). Left PCA otherwise widely patent distally. On the right, there is acute occlusion at the distal right P2 segment (series 7, image 141). Perfusion within right PCA branches distally could be related to subocclusive thrombus and/or collateralization. Venous sinuses: Dedicated venogram images were performed. Normal enhancement seen throughout the superior sagittal sinus to the level of the torcula. Transverse and sigmoid sinuses are patent bilaterally. Proximal internal jugular veins patent. Straight sinus, vein of Galen, internal cerebral veins, and basal veins of Rosenthal are patent. No visible abnormality about the cavernous sinus. Superior orbital veins grossly symmetric and within normal limits. No evidence for dural sinus thrombosis. No appreciable cortical venous thrombosis. Anatomic variants: None significant.  No aneurysm. Review of the MIP images confirms the above findings IMPRESSION: 1. 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 2. Subtle irregular nonocclusive filling defect involving the distal  basilar artery/basilar tip, consistent with associated distal embolic disease. Adjacent superior cerebellar arteries are not visualized at their origins, and could be occluded. 3. Associated partially occlusive thrombus with severe stenosis involving the left P1 segment. 4. Additional distal right P2 embolic occlusion. 5. Wide patency of both carotid artery systems and anterior circulation. 6. Negative CT venogram.  No evidence for dural sinus thrombosis. Critical Value/emergent results were called by telephone at the time of interpretation on 07/27/2021 at 1:26 am to provider [facility] , who verbally acknowledged these results. Electronically Signed   By: Rise Mu M.D.   On: 07/27/2021 02:08   DG Pelvis 1-2 Views  Result Date: 07/27/2021 CLINICAL DATA:  Stroke EXAM: PELVIS - 1-2 VIEW COMPARISON:  CT angiography head 07/27/2021 FINDINGS: Approximally 17 cm oval opacity overlying the pelvis likely represents a contrast filled urinary bladder due to recent intravenous contrast administration. Markedly limited evaluation due to overlapping osseous structures and overlying soft tissues. There is no evidence of pelvic fracture or diastasis. No pelvic bone lesions are seen. IMPRESSION: Urinary bladder lumen filled with excreted intravenous contrast. Electronically Signed   By: Tish Frederickson M.D.   On: 07/27/2021 15:14   CT HEAD WO CONTRAST ( )  Result Date: 07/27/2021 CLINICAL DATA:  Follow-up suspected intracranial hemorrhage EXAM: CT HEAD WITHOUT CONTRAST TECHNIQUE: Contiguous axial images were obtained from the base of the skull through the vertex without intravenous contrast. COMPARISON:  None. FINDINGS: Brain: No evidence of acute infarction, hemorrhage, hydrocephalus, extra-axial collection or mass lesion/mass effect. Vascular: No hyperdense vessel or unexpected calcification. Skull: Normal. Negative for fracture or focal lesion. Sinuses/Orbits: No acute finding. Other: None.  IMPRESSION: No acute intracranial pathology. No evidence of intracranial hemorrhage. Electronically Signed   By: Bonna Gains.D.  On: 07/27/2021 09:03   CT Head Wo Contrast  Result Date: 07/26/2021 CLINICAL DATA:  Seizure. EXAM: CT HEAD WITHOUT CONTRAST TECHNIQUE: Contiguous axial images were obtained from the base of the skull through the vertex without intravenous contrast. COMPARISON:  None. FINDINGS: Brain: No evidence of acute infarction, hemorrhage, hydrocephalus, extra-axial collection or mass lesion/mass effect. Vascular: No hyperdense vessel or unexpected calcification. Skull: Normal. Negative for fracture or focal lesion. Sinuses/Orbits: There is mucoperiosteal thickening of paranasal sinuses and complete opacification of the nasal passages. No air-fluid. The mastoid air cells are clear. Other: None IMPRESSION: 1. Normal noncontrast CT of the brain. 2. Paranasal sinus disease. Electronically Signed   By: Elgie Collard M.D.   On: 07/26/2021 23:51   CT ANGIO NECK W OR WO CONTRAST  Result Date: 07/27/2021 CLINICAL DATA:  Initial evaluation for acute altered mental status, evaluate for basilar thrombus. EXAM: CT ANGIOGRAPHY HEAD AND NECK WITH CONTRAST CT VENOGRAM HEAD WITH CONTRAST TECHNIQUE: Multidetector CT imaging of the head and neck was performed using the standard protocol during bolus administration of intravenous contrast. Multiplanar CT image reconstructions and MIPs were obtained to evaluate the vascular anatomy. Carotid stenosis measurements (when applicable) are obtained utilizing NASCET criteria, using the distal internal carotid diameter as the denominator. CONTRAST:  60mL OMNIPAQUE IOHEXOL 350 MG/ML SOLN COMPARISON:  Prior noncontrast head CT from 07/26/2021./ FINDINGS: CTA NECK FINDINGS Aortic arch: Visualized aortic arch normal in caliber with normal branch pattern. No stenosis or other abnormality about the origin of the great vessels. Right carotid system: Right common and  internal carotid arteries widely patent without stenosis, dissection or occlusion. Left carotid system: Left common and internal carotid arteries widely patent without stenosis, dissection or occlusion. Vertebral arteries: Both vertebral arteries arise from the subclavian arteries. No proximal subclavian artery stenosis. Right vertebral artery widely patent within the neck without stenosis or other abnormality. On the left, there is intimal irregularity with intraluminal filling defect involving the proximal left V1 segment, concerning for acute dissection (series 7, image 130). Area of involvement begins at or just beyond the origin, and extends approximately 2.3 cm in length. Associated moderate to severe stenosis proximally (series 5, image 271). Distally, the left vertebral artery is otherwise widely patent to the skull base. Skeleton: No visible acute osseous finding. No discrete or worrisome osseous lesions. Other neck: No other acute soft tissue abnormality within the neck. Endotracheal and enteric tubes in place. Upper chest: Mild atelectatic changes noted dependently within the visualized lungs. Visualized upper chest demonstrates no other acute finding. Review of the MIP images confirms the above findings CTA HEAD FINDINGS Anterior circulation: Both internal carotid arteries widely patent to the termini without stenosis. A1 segments widely patent. Normal anterior communicating artery complex. Both anterior cerebral arteries widely patent to their distal aspects without stenosis. No M1 stenosis or occlusion. Normal MCA bifurcations. Distal MCA branches well perfused and symmetric. Posterior circulation: Both V4 segments patent to the vertebrobasilar junction without stenosis or other abnormality. Both PICA origins patent and normal. Basilar widely patent proximally. There is some a subtle irregular filling defect involving the distal basilar artery/basilar tip, concerning for intraluminal thrombus (series  7, image 128). Secondary mild to moderate narrowing at the basilar tip. Neither superior cerebellar artery well visualized at the origin., and could be occluded. Irregular partially occlusive filling defect seen involving the left P1 segment with resultant moderate to severe stenosis (series 7, image 129). Left PCA otherwise widely patent distally. On the right, there is acute occlusion  at the distal right P2 segment (series 7, image 141). Perfusion within right PCA branches distally could be related to subocclusive thrombus and/or collateralization. Venous sinuses: Dedicated venogram images were performed. Normal enhancement seen throughout the superior sagittal sinus to the level of the torcula. Transverse and sigmoid sinuses are patent bilaterally. Proximal internal jugular veins patent. Straight sinus, vein of Galen, internal cerebral veins, and basal veins of Rosenthal are patent. No visible abnormality about the cavernous sinus. Superior orbital veins grossly symmetric and within normal limits. No evidence for dural sinus thrombosis. No appreciable cortical venous thrombosis. Anatomic variants: None significant.  No aneurysm. Review of the MIP images confirms the above findings IMPRESSION: 1. 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 2. Subtle irregular nonocclusive filling defect involving the distal basilar artery/basilar tip, consistent with associated distal embolic disease. Adjacent superior cerebellar arteries are not visualized at their origins, and could be occluded. 3. Associated partially occlusive thrombus with severe stenosis involving the left P1 segment. 4. Additional distal right P2 embolic occlusion. 5. Wide patency of both carotid artery systems and anterior circulation. 6. Negative CT venogram.  No evidence for dural sinus thrombosis. Critical Value/emergent results were called by telephone at  the time of interpretation on 07/27/2021 at 1:26 am to provider [facility] , who verbally acknowledged these results. Electronically Signed   By: Rise Mu M.D.   On: 07/27/2021 02:08   MR BRAIN WO CONTRAST  Result Date: 07/27/2021 CLINICAL DATA:  Neuro deficit, acute, stroke suspected. Status post mechanical thrombectomy. EXAM: MRI HEAD WITHOUT CONTRAST TECHNIQUE: Multiplanar, multiecho pulse sequences of the brain and surrounding structures were obtained without intravenous contrast. COMPARISON:  Head CT July 27, 2021. FINDINGS: Brain: Scattered areas of restricted diffusion are seen involving the right thalamus, medial aspect of the right occipital and temporal lobes, including right hippocampus, centrally in the midbrain, on the left side of the pons and in the bilateral cerebellar hemispheres consistent with acute infarcts. Petechial hemorrhage seen in the left inferior cerebellar hemisphere and right occipital lobe. No hydrocephalus, extra-axial collection or mass lesion. Vascular: Normal flow voids. Skull and upper cervical spine: Normal marrow signal. Sinuses/Orbits: Mucosal thickening throughout the paranasal sinuses with fluid level within the sphenoid sinuses. The orbits are maintained. Other: Presence of endotracheal tube. IMPRESSION: Acute infarcts involving the right thalamus, medial aspect of the right occipital and temporal lobes, including right hippocampus, centrally in the midbrain, on the left side of the pons and in the bilateral cerebellar hemispheres. Petechial hemorrhage seen in the left inferior cerebellar hemisphere right occipital lobe. No significant mass effect. Electronically Signed   By: Baldemar Lenis M.D.   On: 07/27/2021 16:40   IR CT Head Ltd  Result Date: 07/28/2021 INDICATION: Onset of seizures, decreased level of consciousness. CT angiogram of the head and neck demonstrates a proximal left vertebral artery dissection with associated  significant stenosis, and bilateral PCA occlusions proximal left P1, and distal right P1, and distal basilar artery. EMERGENT LARGE VESSEL OCCLUSION THROMBOLYSIS (POSTERIOR CIRCULATION) COMPARISON:  CT angiogram of the head and neck July 27, 2021. MEDICATIONS: Ancef 2 g IV antibiotic was administered within 1 hour of the procedure. ANESTHESIA/SEDATION: General anesthesia. CONTRAST:  Omnipaque 300 proximally 130 mL. FLUOROSCOPY TIME:  Fluoroscopy Time: 72 minutes 42 seconds (4188 mGy). COMPLICATIONS: None immediate. TECHNIQUE: Following a full explanation of the procedure along with the potential associated complications, an informed witnessed consent was obtained. The  risks of intracranial hemorrhage of 10%, worsening neurological deficit, ventilator dependency, death and inability to revascularize were all reviewed in detail with the patient's sister. The patient was then put under general anesthesia by the Department of Anesthesiology at [facility]. The right groin was prepped and draped in the usual sterile fashion. Thereafter using modified Seldinger technique, transfemoral access into the right common femoral artery was obtained without difficulty. Over a 0.035 inch guidewire an 8 [place] Pinnacle 25 cm sheath was inserted. Through this, and also over a 0.035 inch guidewire a 5 [place] JB 1 catheter was advanced to the aortic arch region and selectively positioned in the right common carotid artery, the right subclavian artery, the left vertebral artery. FINDINGS: The right common carotid arteriogram demonstrates the right external carotid artery and its major branches to be widely patent. The right internal carotid artery at the bulb to the cranial skull base is widely patent. There is a mild to moderate stenosis at the cervical petrous junction. More distally, the petrous, the cavernous and the supraclinoid segments are widely patent. The right middle cerebral artery and the right anterior cerebral  artery opacify into the capillary and venous phases. Transient cross-filling via the anterior communicating artery of the left anterior cerebral A2 segment and distally is seen. The innominate arteriogram demonstrates the right common carotid artery proximally, and the right subclavian artery proximally to be widely patent. The right vertebral artery is seen to be patent with opacification made to the cranial skull base. The left vertebral artery arteriogram demonstrates abnormal appearance in the proximal 1/3 starting at the origin. There is a tapered irregularity associated with severe stenosis and a prominent smooth filling defect. More distally, the vessel opacifies normally to the cranial skull base. Patency is seen of the left vertebrobasilar junction and the left posterior-inferior cerebellar artery. Prominent filling defects are seen in the distal basilar artery, and the left posterior cerebral artery at its origin, and the right posterior cerebral artery in the distal P1 segment. PROCEDURE: The diagnostic JB 1 catheter in the distal right subclavian artery was then exchanged over a 0.035 inch 300 cm Rosen exchange guidewire for a 90 cm 8 [place] Neuron Max sheath, which was advanced to the distal right subclavian artery. The guidewire was removed. Good aspiration obtained from the hub of the Neuron Max sheath. A control arteriogram performed through the Neuron Max sheath after it had been withdrawn just proximal to the origin of the right vertebral artery demonstrates no evidence of spasms, dissections or of intraluminal filling defects. Over a 0.035 Roadrunner guidewire, a diagnostic JB 1 catheter was advanced within the Neuron Max sheath to its distal end. The vertebral artery was then selected with the guidewire followed by the 5 French diagnostic catheter which, the 8 [place] Neuron Max sheath was advanced to the distal cervical right ICA. The guidewire, and the support catheter were removed. Good  aspiration obtained from the hub of the Neuron Max sheath. A gentle control arteriogram performed through this demonstrated no evidence of spasms, dissections or of intraluminal filling defects. The opacified right vertebral artery distally, the right vertebrobasilar junction and the right posterior-inferior cerebellar artery demonstrate wide patency. Cross-filling of the left vertebrobasilar junction was noted. The basilar artery and its proximal 2/3, the anterior-inferior cerebellar arteries demonstrate uniform enhancement. However, prominent pre occlusive filling defects noted in the distal 1/3 of the basilar artery extending into the right posterior cerebral artery distal P1 segment, and the origin of the left posterior  cerebral artery. ENDOVASCULAR REVASCULARIZATION OF DISTAL BASILAR ARTERY THROMBOSIS, AND OF THE POSTERIOR CEREBRAL ARTERIES AND OF THE SYMPTOMATIC DISSECTED PROXIMAL LEFT VERTEBRAL ARTERY EXTENDING TO THE ORIGIN. Over a 0.014 inch standard Synchro micro guidewire with a moderate J configuration, the combination of a 162 cm 021 microcatheter inside of an 071 132 cm Zoom aspiration catheter combination was advanced to the right vertebrobasilar junction. The micro guidewire was then gently advanced more distally into the distal basilar artery initially and then into the left posterior cerebral artery distal P2 segment. The guidewire was removed. Good aspiration obtained from the hub of the microcatheter. Gentle control arteriogram performed through the microcatheter demonstrated safe positioning of the tip of the microcatheter. This was then connected to continuous heparinized saline infusion. At this time, a 4 mm x 40 mm Solitaire X retrieval device was advanced to the distal end of the microcatheter. The O ring on the delivery microcatheter was loosened. With slight forward gentle traction with the right hand on the delivery micro guidewire, with the left hand the delivery microcatheter was  unsheathed deploying the retrieval device. The proximal portion of the device was seen in the distal basilar artery. Thereafter, with constant aspiration with a Penumbra aspiration device at the hub of the 071 Zoom aspiration catheter and a 60 mL syringe at the hub of the Neuron Max sheath for approximately 3-1/2 minutes, the combination of the retrieval device, the 071 Zoom aspiration catheter, and the microcatheter were retrieved and removed. A control arteriogram performed through the Neuron Max sheath in the distal right vertebral artery demonstrated near complete revascularization of the distal basilar artery and the posterior cerebral arteries. A small filling defect was noted in the distal left superior cerebellar artery. This was deemed too distal and small for endovascular retrieval. A TICI 2C reperfusion was achieved. The sheath was then retrieved and positioned at the origin of the left vertebral artery. A control arteriogram performed through the Neuron Max sheath just proximal to the origin of the left vertebral artery continued to demonstrate sequela of a long segment dissection associated with an intimal flap, and a mural hematoma. Associated significant narrowing was noted. However, more distally, the contrast was seen to the cranial skull base into the left vertebrobasilar junction and the left posterior-inferior cerebellar artery. 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 A 2.4 mm x 5 mm Emboshield device was then prepped and purged of air in its housing. The device was then retrieved into the delivery microcatheter. The combination of the 014 inch and the delivery microcatheter was retrieved from the housing. A J configuration was given to the tip of the micro guidewire. Under constant roadmap technique and constant fluoroscopic guidance, the combination of the Emboshield delivery catheter, and the micro guidewire was advanced to just proximal to the origin of the left vertebral  artery. Using a torque device, access with the micro guidewire was first obtained without contact with the underlying dissected segment. The combination was then advanced without difficulty to the cranial skull base to the level of C1-C2. In the usual manner, the filter device was then deployed. The delivery microcatheter was retrieved and removed while maintaining constant positioning of tip of the micro guidewire. Measurements were then performed of the proximal left vertebral artery in its most normal segment. The left dissection was also measured. It was decided to proceed with placement of a 3.5 mm x 34 mm Onyx Frontier balloon mountable stent. This was prepped with 75% contrast and 25%  heparinized saline infusion antegradely, and with just heparinized saline infusion retrogradely. Thereafter using the rapid exchange technique, the combination was advanced without difficulty to the proximal left vertebral artery. The proximal and the distal landing zones were then identified. The stent was then deployed by inflating the balloon mountable stent using micro inflation syringe device via micro tubing to just over 3 mm where it was maintained for approximately 15 seconds. Thereafter, the balloon was deflated and removed. A control arteriogram performed through Neuron Max sheath just proximal to the origin of the left vertebral artery now demonstrated excellent apposition of the stent with wide patency of the left vertebral artery proximally. More distally, this contrast flow was noted into the left vertebrobasilar junction, the basilar artery and the just revascularized posterior cerebral arteries. No evidence of intraluminal filling defects was seen extra cranially or intracranially. The filter capture device was then advanced again using the rapid exchange technique to just the proximal marker on the device. The filter device was then captured into the capture device by retrieving on the micro guidewire. The  combination was then retrieved slowly under constant fluoroscopic guidance ensuring no entanglement with the planted stent in the proximal left vertebral artery. Control arteriogram was then performed at 10 and 20 minutes post deployment of the stent. These continued to demonstrate excellent flow in the proximal left vertebral artery. Intracranially the TICI 2C revascularization was maintained. Following this, the Neuron Max sheath was then retrieved and removed. The 8 French sheath was removed with hemostasis achieved at the right groin puncture site with a 6 French Angio-Seal closure device. Distal pulses remained palpable in both feet unchanged. A CT of the brain obtained demonstrated no evidence of intracranial hemorrhage or hydrocephalus. Prior to planting the stent at the second site in the proximal left vertebral artery, patient was given 81 mg of aspirin, and 180 mg of Brilinta via an orogastric tube. Also right after stent placement, patient was given a bolus dose of cangrelor followed by a low-dose four hour infusion. Patient was left intubated on account of a pre procedural clinical condition. He was then transferred to the neuro ICU for post revascularization measures. IMPRESSION: Status post endovascular revascularization of occluded distal basilar artery and both posterior cerebral arteries as described above with a 4 mm x 4 mm Solitaire X retrieval device and contact aspiration achieving a TICI score of 2C. 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 PLAN: Follow-up in the clinic 2 weeks post discharge. Electronically Signed   By: Julieanne Cotton M.D.   On: 07/28/2021 08:14   DG Chest Port 1 View  Result Date: 07/27/2021 CLINICAL DATA:  Encounter for intubation EXAM: PORTABLE CHEST 1 VIEW COMPARISON:  Chest radiograph 07/26/2021 FINDINGS: Endotracheal tube overlies the  midthoracic trachea. Enteric tube tip and side port overlie the stomach. There is a new left neck stent. Unchanged cardiomediastinal silhouette. No focal airspace consolidation. No large pleural effusion or visible pneumothorax. There is no acute osseous abnormality. IMPRESSION: Endotracheal tube overlies the midthoracic trachea. No new airspace disease. Electronically Signed   By: Caprice Renshaw M.D.   On: 07/27/2021 08:57   DG Chest Portable 1 View  Result Date: 07/26/2021 CLINICAL DATA:  Status post intubation. EXAM: PORTABLE CHEST 1 VIEW COMPARISON:  None. FINDINGS: Endotracheal tube with tip approximately 2 cm above the carina. Enteric tube extends below the diaphragm with tip beyond the inferior margin of the image.  Mild cardiomegaly with mild central vascular congestion. No focal consolidation, pleural effusion or pneumothorax. No acute osseous pathology. IMPRESSION: 1. Endotracheal tube above the carina. 2. Mild cardiomegaly with mild central vascular congestion. No focal consolidation. Electronically Signed   By: Elgie Collard M.D.   On: 07/26/2021 23:06   DG Abd Portable 1V  Result Date: 07/28/2021 CLINICAL DATA:  Evaluate feeding tube placement. EXAM: PORTABLE ABDOMEN - 1 VIEW COMPARISON:  07/27/2021 FINDINGS: Feeding tube has been placed with tip well below the GE junction. The tip projects over the expected location of the gastric antrum or pylorus. Gaseous distension of the colon is again noted. IMPRESSION: Feeding tube tip projects over the gastric antrum or pylorus. Electronically Signed   By: Signa Kell M.D.   On: 07/28/2021 15:46   DG Abd Portable 1V  Result Date: 07/27/2021 CLINICAL DATA:  NG tube placement EXAM: PORTABLE ABDOMEN - 1 VIEW COMPARISON:  None. FINDINGS: Endotracheal tube overlies the midthoracic trachea. Nasogastric tube side port overlies the body of the stomach, tip overlying the distal stomach. There is gas distention of bowel in the abdomen. IMPRESSION:  Nasogastric tube tip and side port overlie the stomach. Gaseous distension of bowel in the abdomen. Electronically Signed   By: Caprice Renshaw M.D.   On: 07/27/2021 09:50   EEG adult  Result Date: 07/27/2021 Charlsie Quest, MD     07/27/2021  1:54 PM Patient Name: Amous Crewe MRN: [id number] Epilepsy Attending: Charlsie Quest Referring Physician/Provider: Dr Delia Heady Date: 07/27/2021 Duration: 22.57 mins Patient history: 25 year old male with seizure-like episodes.  EEG to evaluate for seizure. Level of alertness: Lethargic AEDs during EEG study: Propofol Technical aspects: This EEG study was done with scalp electrodes positioned according to the 10-20 International system of electrode placement. Electrical activity was acquired at a sampling rate of 500Hz  and reviewed with a high frequency filter of 70Hz  and a low frequency filter of 1Hz . EEG data were recorded continuously and digitally stored. Description: EEG showed an excessive amount of 15 to 18 Hz beta activity  distributed symmetrically and diffusely.   Hyperventilation and photic stimulation were not performed.   ABNORMALITY - Excessive beta, generalized IMPRESSION: This study is within normal limits. No seizures or epileptiform discharges were seen throughout the recording. The excessive beta activity seen in the background is most likely due to the effect of medications like benzodiazepine and is a benign EEG pattern. Priyanka Annabelle Harman   Overnight EEG with video  Result Date: 07/28/2021 Charlsie Quest, MD     07/28/2021  2:19 PM Patient Name: Kregg Cihlar MRN: [id number] Epilepsy Attending: Charlsie Quest Referring Physician/Provider: Dr Erick Blinks Duration: 07/27/2021 1058 to 07/28/2021 1027  Patient history: 25 year old male with seizure-like episodes.  EEG to evaluate for seizure.  Level of alertness: Lethargic  AEDs during EEG study: Propofol  Technical aspects: This EEG study was done with scalp electrodes positioned  according to the 10-20 International system of electrode placement. Electrical activity was acquired at a sampling rate of 500Hz  and reviewed with a high frequency filter of 70Hz  and a low frequency filter of 1Hz . EEG data were recorded continuously and digitally stored.  Description: EEG initially showed an excessive amount of 15 to 18 Hz beta activity  distributed symmetrically and diffusely. Gradually, EEG showed posterior dominant rhythm of 9 Hz activity of moderate voltage (25-35 uV) seen predominantly in posterior head regions, symmetric and reactive to eye opening and eye closing.   Hyperventilation and photic stimulation  were not performed.    ABNORMALITY - Excessive beta, generalized  IMPRESSION: This study is within normal limits. No seizures or epileptiform discharges were seen throughout the recording.  The excessive beta activity seen in the background is most likely due to the effect of medications like benzodiazepine and is a benign EEG pattern.  Charlsie Quest   ECHOCARDIOGRAM COMPLETE  Result Date: 07/27/2021    ECHOCARDIOGRAM REPORT   Patient Name:   DAMONTE DENNER Date of Exam: 07/27/2021 Medical Rec #:  [id number]       Height:       62.0 in Accession #:    [id number]      Weight:       210.0 lb Date of Birth:  04/22/1996       BSA:          1.952 m Patient Age:    24 years        BP:           97/71 mmHg Patient Gender: M               HR:           103 bpm. Exam Location:  Inpatient Procedure: 2D Echo, Cardiac Doppler and Color Doppler Indications:    Stroke  History:        Patient has no prior history of Echocardiogram examinations.                 Seizure and Stroke.  Sonographer:    Vanetta Shawl Referring Phys: [id number] [facility] KHALIQDINA IMPRESSIONS  1. Left ventricular ejection fraction, by estimation, is 60 to 65%. The left ventricle has normal function. The left ventricle has no regional wall motion abnormalities. Left ventricular diastolic parameters were normal.  2. Right  ventricular systolic function is normal. The right ventricular size is normal.  3. The mitral valve is normal in structure. No evidence of mitral valve regurgitation. No evidence of mitral stenosis.  4. The aortic valve is normal in structure. Aortic valve regurgitation is not visualized. No aortic stenosis is present.  5. The inferior vena cava is normal in size with greater than 50% respiratory variability, suggesting right atrial pressure of 3 mmHg. Conclusion(s)/Recommendation(s): No intracardiac source of embolism detected on this transthoracic study. A transesophageal echocardiogram is recommended to exclude cardiac source of embolism if clinically indicated. FINDINGS  Left Ventricle: Left ventricular ejection fraction, by estimation, is 60 to 65%. The left ventricle has normal function. The left ventricle has no regional wall motion abnormalities. The left ventricular internal cavity size was normal in size. There is  no left ventricular hypertrophy. Left ventricular diastolic parameters were normal. Right Ventricle: The right ventricular size is normal. No increase in right ventricular wall thickness. Right ventricular systolic function is normal. Left Atrium: Left atrial size was normal in size. Right Atrium: Right atrial size was normal in size. Pericardium: There is no evidence of pericardial effusion. Mitral Valve: The mitral valve is normal in structure. No evidence of mitral valve regurgitation. No evidence of mitral valve stenosis. Tricuspid Valve: The tricuspid valve is normal in structure. Tricuspid valve regurgitation is not demonstrated. No evidence of tricuspid stenosis. Aortic Valve: The aortic valve is normal in structure. Aortic valve regurgitation is not visualized. No aortic stenosis is present. Aortic valve mean gradient measures 4.0 mmHg. Aortic valve peak gradient measures 8.1 mmHg. Aortic valve area, by VTI measures 2.09 cm. Pulmonic Valve: The pulmonic valve was normal in structure.  Pulmonic valve  regurgitation is not visualized. No evidence of pulmonic stenosis. Aorta: The aortic root is normal in size and structure. Venous: The inferior vena cava is normal in size with greater than 50% respiratory variability, suggesting right atrial pressure of 3 mmHg. IAS/Shunts: No atrial level shunt detected by color flow Doppler.  LEFT VENTRICLE PLAX 2D LVIDd:         4.70 cm   Diastology LVIDs:         2.90 cm   LV e' medial: 10.60 cm/s LV PW:         1.00 cm LV IVS:        1.00 cm LVOT diam:     1.90 cm LV SV:         46 LV SV Index:   24 LVOT Area:     2.84 cm  RIGHT VENTRICLE RV S prime:     20.30 cm/s LEFT ATRIUM             Index LA diam:        2.90 cm 1.49 cm/m LA Vol (A2C):   40.5 ml 20.75 ml/m LA Vol (A4C):   37.2 ml 19.06 ml/m LA Biplane Vol: 40.9 ml 20.96 ml/m  AORTIC VALVE                    PULMONIC VALVE AV Area (Vmax):    2.04 cm     PV Vmax:       1.37 m/s AV Area (Vmean):   2.18 cm     PV Peak grad:  7.5 mmHg AV Area (VTI):     2.09 cm AV Vmax:           142.00 cm/s AV Vmean:          92.800 cm/s AV VTI:            0.220 m AV Peak Grad:      8.1 mmHg AV Mean Grad:      4.0 mmHg LVOT Vmax:         102.00 cm/s LVOT Vmean:        71.300 cm/s LVOT VTI:          0.162 m LVOT/AV VTI ratio: 0.74  AORTA Ao Root diam: 2.90 cm Ao Asc diam:  2.60 cm  SHUNTS Systemic VTI:  0.16 m Systemic Diam: 1.90 cm Rachelle Hora Croitoru MD Electronically signed by Thurmon Fair MD Signature Date/Time: 07/27/2021/10:30:31 AM    Final    CT VENOGRAM HEAD  Result Date: 07/27/2021 CLINICAL DATA:  Initial evaluation for acute altered mental status, evaluate for basilar thrombus. EXAM: CT ANGIOGRAPHY HEAD AND NECK WITH CONTRAST CT VENOGRAM HEAD WITH CONTRAST TECHNIQUE: Multidetector CT imaging of the head and neck was performed using the standard protocol during bolus administration of intravenous contrast. Multiplanar CT image reconstructions and MIPs were obtained to evaluate the vascular anatomy. Carotid  stenosis measurements (when applicable) are obtained utilizing NASCET criteria, using the distal internal carotid diameter as the denominator. CONTRAST:  60mL OMNIPAQUE IOHEXOL 350 MG/ML SOLN COMPARISON:  Prior noncontrast head CT from 07/26/2021./ FINDINGS: CTA NECK FINDINGS Aortic arch: Visualized aortic arch normal in caliber with normal branch pattern. No stenosis or other abnormality about the origin of the great vessels. Right carotid system: Right common and internal carotid arteries widely patent without stenosis, dissection or occlusion. Left carotid system: Left common and internal carotid arteries widely patent without stenosis, dissection or occlusion. Vertebral arteries: Both vertebral arteries arise from the subclavian  arteries. No proximal subclavian artery stenosis. Right vertebral artery widely patent within the neck without stenosis or other abnormality. On the left, there is intimal irregularity with intraluminal filling defect involving the proximal left V1 segment, concerning for acute dissection (series 7, image 130). Area of involvement begins at or just beyond the origin, and extends approximately 2.3 cm in length. Associated moderate to severe stenosis proximally (series 5, image 271). Distally, the left vertebral artery is otherwise widely patent to the skull base. Skeleton: No visible acute osseous finding. No discrete or worrisome osseous lesions. Other neck: No other acute soft tissue abnormality within the neck. Endotracheal and enteric tubes in place. Upper chest: Mild atelectatic changes noted dependently within the visualized lungs. Visualized upper chest demonstrates no other acute finding. Review of the MIP images confirms the above findings CTA HEAD FINDINGS Anterior circulation: Both internal carotid arteries widely patent to the termini without stenosis. A1 segments widely patent. Normal anterior communicating artery complex. Both anterior cerebral arteries widely patent to  their distal aspects without stenosis. No M1 stenosis or occlusion. Normal MCA bifurcations. Distal MCA branches well perfused and symmetric. Posterior circulation: Both V4 segments patent to the vertebrobasilar junction without stenosis or other abnormality. Both PICA origins patent and normal. Basilar widely patent proximally. There is some a subtle irregular filling defect involving the distal basilar artery/basilar tip, concerning for intraluminal thrombus (series 7, image 128). Secondary mild to moderate narrowing at the basilar tip. Neither superior cerebellar artery well visualized at the origin., and could be occluded. Irregular partially occlusive filling defect seen involving the left P1 segment with resultant moderate to severe stenosis (series 7, image 129). Left PCA otherwise widely patent distally. On the right, there is acute occlusion at the distal right P2 segment (series 7, image 141). Perfusion within right PCA branches distally could be related to subocclusive thrombus and/or collateralization. Venous sinuses: Dedicated venogram images were performed. Normal enhancement seen throughout the superior sagittal sinus to the level of the torcula. Transverse and sigmoid sinuses are patent bilaterally. Proximal internal jugular veins patent. Straight sinus, vein of Galen, internal cerebral veins, and basal veins of Rosenthal are patent. No visible abnormality about the cavernous sinus. Superior orbital veins grossly symmetric and within normal limits. No evidence for dural sinus thrombosis. No appreciable cortical venous thrombosis. Anatomic variants: None significant.  No aneurysm. Review of the MIP images confirms the above findings IMPRESSION: 1. 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 2. Subtle irregular nonocclusive filling defect involving the distal basilar artery/basilar tip, consistent with  associated distal embolic disease. Adjacent superior cerebellar arteries are not visualized at their origins, and could be occluded. 3. Associated partially occlusive thrombus with severe stenosis involving the left P1 segment. 4. Additional distal right P2 embolic occlusion. 5. Wide patency of both carotid artery systems and anterior circulation. 6. Negative CT venogram.  No evidence for dural sinus thrombosis. Critical Value/emergent results were called by telephone at the time of interpretation on 07/27/2021 at 1:26 am to provider [facility] , who verbally acknowledged these results. Electronically Signed   By: Rise Mu M.D.   On: 07/27/2021 02:08   IR PERCUTANEOUS ART THROMBECTOMY/INFUSION INTRACRANIAL INC DIAG ANGIO  Result Date: 07/28/2021 INDICATION: Onset of seizures, decreased level of consciousness. 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 EMERGENT LARGE VESSEL OCCLUSION THROMBOLYSIS (POSTERIOR CIRCULATION) COMPARISON:  CT angiogram of the head and neck July 27, 2021. MEDICATIONS: Ancef 2 g IV antibiotic was administered within 1 hour of the procedure. ANESTHESIA/SEDATION: General anesthesia. CONTRAST:  Omnipaque 300 proximally 130 mL. FLUOROSCOPY TIME:  Fluoroscopy Time: 72 minutes 42 seconds (4188 mGy). COMPLICATIONS: None immediate. TECHNIQUE: Following a full explanation of the procedure along with the potential associated complications, an informed witnessed consent was obtained. The risks of intracranial hemorrhage of 10%, worsening neurological deficit, ventilator dependency, death and inability to revascularize were all reviewed in detail with the patient's sister. The patient was then put under general anesthesia by the Department of Anesthesiology at [facility]. The right groin was  prepped and draped in the usual sterile fashion. Thereafter using modified Seldinger technique, transfemoral access into the right common femoral artery was obtained without difficulty. Over a 0.035 inch guidewire an 8 [place] Pinnacle 25 cm sheath was inserted. Through this, and also over a 0.035 inch guidewire a 5 [place] JB 1 catheter was advanced to the aortic arch region and selectively positioned in the right common carotid artery, the right subclavian artery, the left vertebral artery. FINDINGS: The right common carotid arteriogram demonstrates the right external carotid artery and its major branches to be widely patent. The right internal carotid artery at the bulb to the cranial skull base is widely patent. There is a mild to moderate stenosis at the cervical petrous junction. More distally, the petrous, the cavernous and the supraclinoid segments are widely patent. The right middle cerebral artery and the right anterior cerebral artery opacify into the capillary and venous phases. Transient cross-filling via the anterior communicating artery of the left anterior cerebral A2 segment and distally is seen. The innominate arteriogram demonstrates the right common carotid artery proximally, and the right subclavian artery proximally to be widely patent. The right vertebral artery is seen to be patent with opacification made to the cranial skull base. The left vertebral artery arteriogram demonstrates abnormal appearance in the proximal 1/3 starting at the origin. There is a tapered irregularity associated with severe stenosis and a prominent smooth filling defect. More distally, the vessel opacifies normally to the cranial skull base. Patency is seen of the left vertebrobasilar junction and the left posterior-inferior cerebellar artery. Prominent filling defects are seen in the distal basilar artery, and the left posterior cerebral artery at its origin, and the right posterior cerebral artery in the distal P1  segment. PROCEDURE: The diagnostic JB 1 catheter in the distal right subclavian artery was then exchanged over a 0.035 inch 300 cm Rosen exchange guidewire for a 90 cm 8 [place] Neuron Max sheath, which was advanced to the distal right subclavian artery. The guidewire was removed. Good aspiration obtained from the hub of the Neuron Max sheath. A control arteriogram performed through the Neuron Max sheath after it had been withdrawn just proximal to the origin of the right vertebral artery demonstrates no evidence of spasms, dissections or of intraluminal filling defects. Over a 0.035 Roadrunner guidewire, a diagnostic JB 1 catheter was advanced within the Neuron Max sheath to its distal end. The vertebral artery was then selected with the guidewire followed by the 5 French diagnostic catheter which, the 8 [place] Neuron Max sheath was advanced to the distal cervical right ICA. The guidewire, and the support catheter were removed. Good aspiration obtained from the hub of the Neuron Max sheath. A gentle control arteriogram performed through this demonstrated no evidence  of spasms, dissections or of intraluminal filling defects. The opacified right vertebral artery distally, the right vertebrobasilar junction and the right posterior-inferior cerebellar artery demonstrate wide patency. Cross-filling of the left vertebrobasilar junction was noted. The basilar artery and its proximal 2/3, the anterior-inferior cerebellar arteries demonstrate uniform enhancement. However, prominent pre occlusive filling defects noted in the distal 1/3 of the basilar artery extending into the right posterior cerebral artery distal P1 segment, and the origin of the left posterior cerebral artery. ENDOVASCULAR REVASCULARIZATION OF DISTAL BASILAR ARTERY THROMBOSIS, AND OF THE POSTERIOR CEREBRAL ARTERIES AND OF THE SYMPTOMATIC DISSECTED PROXIMAL LEFT VERTEBRAL ARTERY EXTENDING TO THE ORIGIN. Over a 0.014 inch standard Synchro micro guidewire with  a moderate J configuration, the combination of a 162 cm 021 microcatheter inside of an 071 132 cm Zoom aspiration catheter combination was advanced to the right vertebrobasilar junction. The micro guidewire was then gently advanced more distally into the distal basilar artery initially and then into the left posterior cerebral artery distal P2 segment. The guidewire was removed. Good aspiration obtained from the hub of the microcatheter. Gentle control arteriogram performed through the microcatheter demonstrated safe positioning of the tip of the microcatheter. This was then connected to continuous heparinized saline infusion. At this time, a 4 mm x 40 mm Solitaire X retrieval device was advanced to the distal end of the microcatheter. The O ring on the delivery microcatheter was loosened. With slight forward gentle traction with the right hand on the delivery micro guidewire, with the left hand the delivery microcatheter was unsheathed deploying the retrieval device. The proximal portion of the device was seen in the distal basilar artery. Thereafter, with constant aspiration with a Penumbra aspiration device at the hub of the 071 Zoom aspiration catheter and a 60 mL syringe at the hub of the Neuron Max sheath for approximately 3-1/2 minutes, the combination of the retrieval device, the 071 Zoom aspiration catheter, and the microcatheter were retrieved and removed. A control arteriogram performed through the Neuron Max sheath in the distal right vertebral artery demonstrated near complete revascularization of the distal basilar artery and the posterior cerebral arteries. A small filling defect was noted in the distal left superior cerebellar artery. This was deemed too distal and small for endovascular retrieval. A TICI 2C reperfusion was achieved. The sheath was then retrieved and positioned at the origin of the left vertebral artery. A control arteriogram performed through the Neuron Max sheath just proximal to  the origin of the left vertebral artery continued to demonstrate sequela of a long segment dissection associated with an intimal flap, and a mural hematoma. Associated significant narrowing was noted. However, more distally, the contrast was seen to the cranial skull base into the left vertebrobasilar junction and the left posterior-inferior cerebellar artery. 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 A 2.4 mm x 5 mm Emboshield device was then prepped and purged of air in its housing. The device was then retrieved into the delivery microcatheter. The combination of the 014 inch and the delivery microcatheter was retrieved from the housing. A J configuration was given to the tip of the micro guidewire. Under constant roadmap technique and constant fluoroscopic guidance, the combination of the Emboshield delivery catheter, and the micro guidewire was advanced to just proximal to the origin of the left vertebral artery. Using a torque device, access with the micro guidewire was first obtained without contact with the underlying dissected segment. The combination was then advanced without difficulty to the cranial skull  base to the level of C1-C2. In the usual manner, the filter device was then deployed. The delivery microcatheter was retrieved and removed while maintaining constant positioning of tip of the micro guidewire. Measurements were then performed of the proximal left vertebral artery in its most normal segment. The left dissection was also measured. It was decided to proceed with placement of a 3.5 mm x 34 mm Onyx Frontier balloon mountable stent. This was prepped with 75% contrast and 25% heparinized saline infusion antegradely, and with just heparinized saline infusion retrogradely. Thereafter using the rapid exchange technique, the combination was advanced without difficulty to the proximal left vertebral artery. The proximal and the distal landing zones were then identified. The stent was  then deployed by inflating the balloon mountable stent using micro inflation syringe device via micro tubing to just over 3 mm where it was maintained for approximately 15 seconds. Thereafter, the balloon was deflated and removed. A control arteriogram performed through Neuron Max sheath just proximal to the origin of the left vertebral artery now demonstrated excellent apposition of the stent with wide patency of the left vertebral artery proximally. More distally, this contrast flow was noted into the left vertebrobasilar junction, the basilar artery and the just revascularized posterior cerebral arteries. No evidence of intraluminal filling defects was seen extra cranially or intracranially. The filter capture device was then advanced again using the rapid exchange technique to just the proximal marker on the device. The filter device was then captured into the capture device by retrieving on the micro guidewire. The combination was then retrieved slowly under constant fluoroscopic guidance ensuring no entanglement with the planted stent in the proximal left vertebral artery. Control arteriogram was then performed at 10 and 20 minutes post deployment of the stent. These continued to demonstrate excellent flow in the proximal left vertebral artery. Intracranially the TICI 2C revascularization was maintained. Following this, the Neuron Max sheath was then retrieved and removed. The 8 French sheath was removed with hemostasis achieved at the right groin puncture site with a 6 French Angio-Seal closure device. Distal pulses remained palpable in both feet unchanged. A CT of the brain obtained demonstrated no evidence of intracranial hemorrhage or hydrocephalus. Prior to planting the stent at the second site in the proximal left vertebral artery, patient was given 81 mg of aspirin, and 180 mg of Brilinta via an orogastric tube. Also right after stent placement, patient was given a bolus dose of cangrelor followed by a  low-dose four hour infusion. Patient was left intubated on account of a pre procedural clinical condition. He was then transferred to the neuro ICU for post revascularization measures. IMPRESSION: Status post endovascular revascularization of occluded distal basilar artery and both posterior cerebral arteries as described above with a 4 mm x 4 mm Solitaire X retrieval device and contact aspiration achieving a TICI score of 2C. 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 PLAN: Follow-up in the clinic 2 weeks post discharge. Electronically Signed   By: Julieanne Cotton M.D.   On: 07/28/2021 08:14   IR ANGIO EXTRACRAN SEL COM CAROTID INNOMINATE UNI R MOD SED  Result Date: 07/28/2021 INDICATION: Onset of seizures, decreased level of consciousness. 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 EMERGENT LARGE VESSEL OCCLUSION THROMBOLYSIS (POSTERIOR CIRCULATION)  COMPARISON:  CT angiogram of the head and neck July 27, 2021. MEDICATIONS: Ancef 2 g IV antibiotic was administered within 1 hour of the procedure. ANESTHESIA/SEDATION: General anesthesia. CONTRAST:  Omnipaque 300 proximally 130 mL. FLUOROSCOPY TIME:  Fluoroscopy Time: 72 minutes 42 seconds (4188 mGy). COMPLICATIONS: None immediate. TECHNIQUE: Following a full explanation of the procedure along with the potential associated complications, an informed witnessed consent was obtained. The risks of intracranial hemorrhage of 10%, worsening neurological deficit, ventilator dependency, death and inability to revascularize were all reviewed in detail with the patient's sister. The patient was then put under general anesthesia by the Department of Anesthesiology at  [facility]. The right groin was prepped and draped in the usual sterile fashion. Thereafter using modified Seldinger technique, transfemoral access into the right common femoral artery was obtained without difficulty. Over a 0.035 inch guidewire an 8 [place] Pinnacle 25 cm sheath was inserted. Through this, and also over a 0.035 inch guidewire a 5 [place] JB 1 catheter was advanced to the aortic arch region and selectively positioned in the right common carotid artery, the right subclavian artery, the left vertebral artery. FINDINGS: The right common carotid arteriogram demonstrates the right external carotid artery and its major branches to be widely patent. The right internal carotid artery at the bulb to the cranial skull base is widely patent. There is a mild to moderate stenosis at the cervical petrous junction. More distally, the petrous, the cavernous and the supraclinoid segments are widely patent. The right middle cerebral artery and the right anterior cerebral artery opacify into the capillary and venous phases. Transient cross-filling via the anterior communicating artery of the left anterior cerebral A2 segment and distally is seen. The innominate arteriogram demonstrates the right common carotid artery proximally, and the right subclavian artery proximally to be widely patent. The right vertebral artery is seen to be patent with opacification made to the cranial skull base. The left vertebral artery arteriogram demonstrates abnormal appearance in the proximal 1/3 starting at the origin. There is a tapered irregularity associated with severe stenosis and a prominent smooth filling defect. More distally, the vessel opacifies normally to the cranial skull base. Patency is seen of the left vertebrobasilar junction and the left posterior-inferior cerebellar artery. Prominent filling defects are seen in the distal basilar artery, and the left posterior cerebral artery at its origin, and the right  posterior cerebral artery in the distal P1 segment. PROCEDURE: The diagnostic JB 1 catheter in the distal right subclavian artery was then exchanged over a 0.035 inch 300 cm Rosen exchange guidewire for a 90 cm 8 [place] Neuron Max sheath, which was advanced to the distal right subclavian artery. The guidewire was removed. Good aspiration obtained from the hub of the Neuron Max sheath. A control arteriogram performed through the Neuron Max sheath after it had been withdrawn just proximal to the origin of the right vertebral artery demonstrates no evidence of spasms, dissections or of intraluminal filling defects. Over a 0.035 Roadrunner guidewire, a diagnostic JB 1 catheter was advanced within the Neuron Max sheath to its distal end. The vertebral artery was then selected with the guidewire followed by the 5 French diagnostic catheter which, the 8 [place] Neuron Max sheath was advanced to the distal cervical right ICA. The guidewire, and the support catheter were removed. Good aspiration obtained from the hub of the Neuron Max sheath. A gentle control arteriogram performed through this demonstrated no evidence of spasms, dissections or of intraluminal filling defects. The opacified  right vertebral artery distally, the right vertebrobasilar junction and the right posterior-inferior cerebellar artery demonstrate wide patency. Cross-filling of the left vertebrobasilar junction was noted. The basilar artery and its proximal 2/3, the anterior-inferior cerebellar arteries demonstrate uniform enhancement. However, prominent pre occlusive filling defects noted in the distal 1/3 of the basilar artery extending into the right posterior cerebral artery distal P1 segment, and the origin of the left posterior cerebral artery. ENDOVASCULAR REVASCULARIZATION OF DISTAL BASILAR ARTERY THROMBOSIS, AND OF THE POSTERIOR CEREBRAL ARTERIES AND OF THE SYMPTOMATIC DISSECTED PROXIMAL LEFT VERTEBRAL ARTERY EXTENDING TO THE ORIGIN. Over a 0.014  inch standard Synchro micro guidewire with a moderate J configuration, the combination of a 162 cm 021 microcatheter inside of an 071 132 cm Zoom aspiration catheter combination was advanced to the right vertebrobasilar junction. The micro guidewire was then gently advanced more distally into the distal basilar artery initially and then into the left posterior cerebral artery distal P2 segment. The guidewire was removed. Good aspiration obtained from the hub of the microcatheter. Gentle control arteriogram performed through the microcatheter demonstrated safe positioning of the tip of the microcatheter. This was then connected to continuous heparinized saline infusion. At this time, a 4 mm x 40 mm Solitaire X retrieval device was advanced to the distal end of the microcatheter. The O ring on the delivery microcatheter was loosened. With slight forward gentle traction with the right hand on the delivery micro guidewire, with the left hand the delivery microcatheter was unsheathed deploying the retrieval device. The proximal portion of the device was seen in the distal basilar artery. Thereafter, with constant aspiration with a Penumbra aspiration device at the hub of the 071 Zoom aspiration catheter and a 60 mL syringe at the hub of the Neuron Max sheath for approximately 3-1/2 minutes, the combination of the retrieval device, the 071 Zoom aspiration catheter, and the microcatheter were retrieved and removed. A control arteriogram performed through the Neuron Max sheath in the distal right vertebral artery demonstrated near complete revascularization of the distal basilar artery and the posterior cerebral arteries. A small filling defect was noted in the distal left superior cerebellar artery. This was deemed too distal and small for endovascular retrieval. A TICI 2C reperfusion was achieved. The sheath was then retrieved and positioned at the origin of the left vertebral artery. A control arteriogram performed  through the Neuron Max sheath just proximal to the origin of the left vertebral artery continued to demonstrate sequela of a long segment dissection associated with an intimal flap, and a mural hematoma. Associated significant narrowing was noted. However, more distally, the contrast was seen to the cranial skull base into the left vertebrobasilar junction and the left posterior-inferior cerebellar artery. 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 A 2.4 mm x 5 mm Emboshield device was then prepped and purged of air in its housing. The device was then retrieved into the delivery microcatheter. The combination of the 014 inch and the delivery microcatheter was retrieved from the housing. A J configuration was given to the tip of the micro guidewire. Under constant roadmap technique and constant fluoroscopic guidance, the combination of the Emboshield delivery catheter, and the micro guidewire was advanced to just proximal to the origin of the left vertebral artery. Using a torque device, access with the micro guidewire was first obtained without contact with the underlying dissected segment. The combination was then advanced without difficulty to the cranial skull base to the level of C1-C2. In the usual manner,  the filter device was then deployed. The delivery microcatheter was retrieved and removed while maintaining constant positioning of tip of the micro guidewire. Measurements were then performed of the proximal left vertebral artery in its most normal segment. The left dissection was also measured. It was decided to proceed with placement of a 3.5 mm x 34 mm Onyx Frontier balloon mountable stent. This was prepped with 75% contrast and 25% heparinized saline infusion antegradely, and with just heparinized saline infusion retrogradely. Thereafter using the rapid exchange technique, the combination was advanced without difficulty to the proximal left vertebral artery. The proximal and the distal  landing zones were then identified. The stent was then deployed by inflating the balloon mountable stent using micro inflation syringe device via micro tubing to just over 3 mm where it was maintained for approximately 15 seconds. Thereafter, the balloon was deflated and removed. A control arteriogram performed through Neuron Max sheath just proximal to the origin of the left vertebral artery now demonstrated excellent apposition of the stent with wide patency of the left vertebral artery proximally. More distally, this contrast flow was noted into the left vertebrobasilar junction, the basilar artery and the just revascularized posterior cerebral arteries. No evidence of intraluminal filling defects was seen extra cranially or intracranially. The filter capture device was then advanced again using the rapid exchange technique to just the proximal marker on the device. The filter device was then captured into the capture device by retrieving on the micro guidewire. The combination was then retrieved slowly under constant fluoroscopic guidance ensuring no entanglement with the planted stent in the proximal left vertebral artery. Control arteriogram was then performed at 10 and 20 minutes post deployment of the stent. These continued to demonstrate excellent flow in the proximal left vertebral artery. Intracranially the TICI 2C revascularization was maintained. Following this, the Neuron Max sheath was then retrieved and removed. The 8 French sheath was removed with hemostasis achieved at the right groin puncture site with a 6 French Angio-Seal closure device. Distal pulses remained palpable in both feet unchanged. A CT of the brain obtained demonstrated no evidence of intracranial hemorrhage or hydrocephalus. Prior to planting the stent at the second site in the proximal left vertebral artery, patient was given 81 mg of aspirin, and 180 mg of Brilinta via an orogastric tube. Also right after stent placement, patient  was given a bolus dose of cangrelor followed by a low-dose four hour infusion. Patient was left intubated on account of a pre procedural clinical condition. He was then transferred to the neuro ICU for post revascularization measures. IMPRESSION: Status post endovascular revascularization of occluded distal basilar artery and both posterior cerebral arteries as described above with a 4 mm x 4 mm Solitaire X retrieval device and contact aspiration achieving a TICI score of 2C. 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 PLAN: Follow-up in the clinic 2 weeks post discharge. Electronically Signed   By: Julieanne Cotton M.D.   On: 07/28/2021 08:14    Labs:  CBC: Recent Labs    07/27/21 0031 07/27/21 0046 07/27/21 0500 07/27/21 1223 07/28/21 0519 07/29/21 0647  WBC 19.0*  --  13.9*  --  11.6* 10.3  HGB 13.2   < > 11.9* 11.9* 11.5* 11.4*  HCT 39.9   < > 34.6* 35.0* 34.1* 34.0*  PLT 179  --  319  --  313 334   < > = values  in this interval not displayed.    COAGS: No results for input(s): INR, APTT in the last 8760 hours.  BMP: Recent Labs    07/27/21 0031 07/27/21 0046 07/27/21 0500 07/27/21 1223 07/28/21 0519 07/29/21 0647  NA 139 140 138 140 137 134*  K 3.0* 2.9* 3.8 3.7 3.2* 4.1  CL 107 110 109  --  106 103  CO2 18*  --  20*  --  23 23  GLUCOSE 112* 115* 103*  --  96 104*  BUN --  7 9  CALCIUM 8.5*  --  8.1*  --  8.3* 8.9  CREATININE 1.13 1.00 1.00  --  0.87 0.77  GFRNONAA >60  --  >60  --  >60 >60    LIVER FUNCTION TESTS: Recent Labs    07/27/21 0031  BILITOT 0.6  AST 38  ALT 38  ALKPHOS 59  PROT 6.7  ALBUMIN 3.6    Assessment and Plan:  25 y/o M who presented to [facility] on 11/8 as a code stroke - he was found to have occlusion of the distal basilar artery and bilateral PCAs, additionally 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  Patient extubated, NG remains due to concern for aspiration on SLP exam. He is alert, follows commands, difficult to assess orientation appropriately due to dysarthria but per significant other he is able to state name/DOB/place/what happened.   No further NIR needs at this time, plan for patient to follow up with Dr. Corliss Skains approximately 2 weeks post discharge (per RN likely going to inpatient rehab) - order has been placed for this and our contact information is in AVS.  NIR remains available as needed, please call with any questions or concerns.  Electronically Signed: Villa Herb, PA-C 07/29/2021, 9:19 AM   I spent a total of 15 Minutes at the the patient's bedside AND on the patient's hospital floor or unit, greater than 50% of which was counseling/coordinating care for follow up code stroke.

## 2021-07-29 NOTE — Progress Notes (Signed)
Modified Barium Swallow Progress Note  Patient Details  Name: Roy [name] MRN: [id number] Date of Birth: 1996-09-04  Today's Date: 07/29/2021  Modified Barium Swallow completed.  Full report located under Chart Review in the Imaging Section.  Brief recommendations include the following:  Clinical Impression  Pt was seen for an MBS. Overall, pt presents with mild oral dysphagia d/t neuromuscular deficits resulting in anterior oral spillage and weak lingual manipulation. Pharyngeal phase WFL across all consistencies. Pt drooling from right side before test began and presents with mild lingual edema. With thin liquid from cup, pt exhibited mild anterior oral spillage from right side which was mitigated with administration through straw. Pt exhibited no oral phase impairments with puree and only mildly prolonged mastication with regular solid. Pt also exhibited mild oral residue coating tongue across consistencies. With pill with thin liquid, pt exhibited anterior oral spillage of thin liquid and weak lingual manipulation, unable to propel pill to posterior oral cavity. Weak lingual manipulation also observed with pill administered in puree, resulting in pill expectoration. Pt care partner reports pt cannot take whole pills at baseline. Recommend regular/thin liquid diet, administering medications crushed in puree. SLP will continue to follow pt acutely for management of diet tolerance.   Swallow Evaluation Recommendations       SLP Diet Recommendations: Regular solids;Thin liquid   Liquid Administration via: Cup;Straw   Medication Administration: Crushed with puree   Supervision: Full assist for feeding   Compensations: Minimize environmental distractions;Slow rate   Postural Changes: Remain semi-upright after after feeds/meals (Comment);Seated upright at 90 degrees   Oral Care Recommendations: Oral care BID        Royce Macadamia 07/29/2021,3:41 PM Breck Coons [place].Ed Nurse, children's [phone] Office [phone]

## 2021-07-29 NOTE — Progress Notes (Signed)
Physical Therapy Treatment Patient Details Name: Roy [name] MRN: [id number] DOB: 1995-12-11 Today's Date: 07/29/2021   History of Present Illness Pt is 25 yo male who presents seizures while smoking marijuana and drinking EtOH with girlfriend at home.  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 No significant PMH    PT Comments    Patient very labile throughout session but easily redirected with positive encouragement. Patient highly motivated to participate with therapy. Able to focus and decrease anxiety with specific task. Patient requires +2 mod-maxA for repeated sit to stand with STEDY. Girlfriend and aunt present and very involved and motivating. Patient continues to demos impaired coordination, impaired balance, and weakness. Continue to recommend CIR level therapies to assist with maximizing functional mobility and safety.    Recommendations for follow up therapy are one component of a multi-disciplinary discharge planning process, led by the attending physician.  Recommendations may be updated based on patient status, additional functional criteria and insurance authorization.  Follow Up Recommendations  Acute inpatient rehab (3hours/day)     Assistance Recommended at Discharge Frequent or constant Supervision/Assistance  Equipment Recommendations  Other (comment) (TBD)    Recommendations for Other Services Rehab consult     Precautions / Restrictions Precautions Precautions: Fall Precaution Comments: BP <140 Restrictions Weight Bearing Restrictions: No     Mobility  Bed Mobility Overal bed mobility: Needs Assistance Bed Mobility: Supine to Sit;Sit to Supine     Supine to sit: +2 for physical assistance;Mod assist;+2 for safety/equipment;HOB elevated Sit to supine: +2 for physical assistance;Total assist;+2 for safety/equipment   General bed mobility comments: pt needs (A) to progress bil LE and core toward  EOB. pt once on R side able to push up into sitting min (A). pt sitting min - mod (A) initially. use of bed pad to bring hips EOB. pt unable to touch floor with bil LE due to height. pt requires +2 total (A) to return to supine    Transfers Overall transfer level: Needs assistance Equipment used: Ambulation equipment used (STEDY) Transfers: Sit to/from Stand Sit to Stand: Max assist;Mod assist;+2 physical assistance;+2 safety/equipment;From elevated surface           General transfer comment: use of stedy to safely facilitate sit<>stand x5. initially requiring max A, progressing to mod A +2. Pt did well when he was given expectations for standing. "This time we are going to stand for 10 seconds...count with [place]" and could become labile - did better when announcing something positive in standing to assist with emotional regulation - which impacts his physical performance    Ambulation/Gait                   Stairs             Wheelchair Mobility    Modified Rankin (Stroke Patients Only) Modified Rankin (Stroke Patients Only) Pre-Morbid Rankin Score: No symptoms Modified Rankin: Severe disability     Balance Overall balance assessment: Needs assistance Sitting-balance support: Bilateral upper extremity supported;Feet supported Sitting balance-Leahy Scale: Poor Sitting balance - Comments: at least min A sitting EOB   Standing balance support: Bilateral upper extremity supported;Reliant on assistive device for balance Standing balance-Leahy Scale: Poor                              Cognition Arousal/Alertness: Awake/alert Behavior During Therapy: Anxious (Labile) Overall Cognitive Status: Impaired/Different from baseline Area of Impairment:  Attention;Safety/judgement;Following commands;Awareness;Problem solving                   Current Attention Level: Sustained   Following Commands: Follows one step commands  inconsistently Safety/Judgement: Decreased awareness of safety;Decreased awareness of deficits Awareness: Intellectual Problem Solving: Slow processing;Decreased initiation;Difficulty sequencing;Requires tactile cues;Requires verbal cues General Comments: Pt labile throughout session, girlfriend "Roy [name]" present and has calming effect on Pt. Roy [name] easily re-directed and responds well to very direct commands, L inattention and cues for visual gaze        Exercises      General Comments General comments (skin integrity, edema, etc.): BP WFL this session, HR around 100 throughout session, SpO2 >95 on RA. RR increased with anxiety, but easily redirected.      Pertinent Vitals/Pain Pain Assessment: No/denies pain Pain Intervention(s): Monitored during session;Repositioned    Home Living                          Prior Function            PT Goals (current goals can now be found in the care plan section) Acute Rehab PT Goals Patient Stated Goal: to get stronger PT Goal Formulation: With patient Time For Goal Achievement: 08/11/21 Potential to Achieve Goals: Good Progress towards PT goals: Progressing toward goals    Frequency    Min 4X/week      PT Plan Current plan remains appropriate    Co-evaluation PT/OT/SLP Co-Evaluation/Treatment: Yes Reason for Co-Treatment: Necessary to address cognition/behavior during functional activity;For patient/therapist safety;To address functional/ADL transfers PT goals addressed during session: Mobility/safety with mobility;Balance;Strengthening/ROM;Proper use of DME OT goals addressed during session: ADL's and self-care;Strengthening/ROM;Proper use of Adaptive equipment and DME      AM-PAC PT "6 Clicks" Mobility   Outcome Measure  Help needed turning from your back to your side while in a flat bed without using bedrails?: A Lot Help needed moving from lying on your back to sitting on the side of a flat bed without using  bedrails?: Total Help needed moving to and from a bed to a chair (including a wheelchair)?: Total Help needed standing up from a chair using your arms (e.g., wheelchair or bedside chair)?: Total Help needed to walk in hospital room?: Total Help needed climbing 3-5 steps with a railing? : Total 6 Click Score: 7    End of Session Equipment Utilized During Treatment: Gait belt;Oxygen Activity Tolerance: Patient tolerated treatment well Patient left: in bed;with call bell/phone within reach;with bed alarm set;with nursing/sitter in room Nurse Communication: Mobility status PT Visit Diagnosis: Unsteadiness on feet (R26.81);Muscle weakness (generalized) (M62.81);Difficulty in walking, not elsewhere classified (R26.2);Other abnormalities of gait and mobility (R26.89);Ataxic gait (R26.0);Other symptoms and signs involving the nervous system (R29.898)     Time: 9728-2060 PT Time Calculation (min) (ACUTE ONLY): 36 min  Charges:  $Therapeutic Activity: 8-22 mins                     Shawntae Lowy A. Dan Humphreys PT, DPT Acute Rehabilitation Services Pager [phone] Office [phone]    Viviann Spare 07/29/2021, 2:17 PM

## 2021-07-29 NOTE — Progress Notes (Addendum)
IP rehab admissions - I met with patient briefly.  I spoke with patient's aunt and his girlfriend out in the lobby area.  Roy [name] is Roy [name] at [phone] and girlfriend is Roy [name] at [phone]. They are in support of inpatient rehab admission.  Plans are for patient to go home with his sister with family support.  Patient has no insurance and has not worked in the last month.  Patient was working at [employer]  I spoke with therapy who feel patient is an excellent CIR candidate.  I will follow along for medical readiness and bed availability and they try to get patient into CIR.  Call for questions.  [phone]

## 2021-07-29 NOTE — Progress Notes (Signed)
NAME:  Roy [name], MRN:  [id number], DOB:  10-Jul-1996, LOS: 3 ADMISSION DATE:  07/26/2021 CONSULTATION DATE:  07/26/2021 REFERRING MD:  Deretha Emory - EDP CHIEF COMPLAINT:  AMS, ?Seizure   History of Present Illness:  25 year old man who presented to [facility] ED via [facility] 11/7 for AMS. No significant PMHx. Per chart review, patient was found altered in his shed at home and movements concerning for seizure activity were noted. Patient vomited x 3 en route with EMS. Active EtOH and THC use per family. No known history of seizure activity.  On ED arrival, patient was reportedly sweating profusely and unable to follow commands. Vitals were notable for tachycardia (117) and hypotension (91/64); SpO2 99% on RA. CBG 128. Given persistent AMS and inability to protect airway, patient was intubated in ED. CT Head was completed with NAICA. Neuro was consulted for STAT EEG and evaluation. Keppra load completed and Ativan 4mg  total administered.  PCCM consulted for admission.  Pertinent Medical History:  EtOH, marijuana use  Significant Hospital Events: Including procedures, antibiotic start and stop dates in addition to other pertinent events   11/7 - Presented to [facility] ED via [facility] for AMS. Found altered in shed at home. C/f seizure activity, vomited x 3 en route. Persistently altered/combative in ED requiring restraints and intubation for airway protection. 11/9 Extubated. MRI brain confirmed basilar artery territory multiple acute strokes  Interim History / Subjective:  No events overnight.   Objective:  Blood pressure 125/77, pulse 94, temperature 98.6 F (37 C), temperature source Oral, resp. rate (!) 21, height 5\' 2"  (1.575 m), weight 105.1 kg, SpO2 99 %.        Intake/Output Summary (Last 24 hours) at 07/29/2021 Last data filed at 07/29/2021 0800 Gross per 24 hour  Intake 1274 ml  Output 1400 ml  Net -126 ml   Filed Weights   07/26/21 2245 07/28/21 0500  Weight: 95.3 kg 105.1 kg    Physical Examination: General: young adult male, lying in bed, no distress  HEENT: pupils 2 mm bilaterally, reactive Neuro: alert, follows commands, motor intact, reports decreased sensation to left upper arm, left upper 4/5 right upper 5/5, left lower 5/5, right lower 4/5. Dysarthria  CV: Regular rate and rhythm, no m/g/r. PULM: clear breath sounds, no use of accessory muscles  GI: Obese, soft, nontender, nondistended. Normoactive bowel sounds. Extremities: No LE edema noted. Skin: Warm/dry, no rashes.  Resolved Hospital Problem List:  Hypokalemia/hypocalcemia  Assessment & Plan:   Encephalopathy, presumed 2/2 substance use > Improved  Found altered at home, per family EtOH and THC use. 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 right thalamus, medial aspect of the right occipital and temporal lobes, including right hippocampus, centrally in the midbrain, on the left side of the pons and in the bilateral cerebellar hemispheres Plan Continue aspirin and Brilinta Continue neuro watch Stroke team is following Continue secondary stroke prophylaxis  PT/OT/ST following > possible candidate for [facility]  Concern for seizure activity Concern for seizure activity noted en route with EMS; per ED RN no rhythmic motion noted but described as "spastic" movements and "foaming at the mouth". No history of seizures.  -EEG negative for seizures  Plan  No evidence for continued concern   Dysphagia  Plan ST plans for Fluro study today   Acute hypoxemic respiratory failure secondary to encephalopathy -Extubated 11/9 Plan  Encourage  good pulmonary hygiene  Maintain Oxygen Saturation >92  Morbid obesity Dietitian is following, cortak was palced  Best Practice: (right click and "Reselect all SmartList Selections" daily)   Diet/type: NPO w/ meds via tube DVT prophylaxis: prophylactic heparin  GI prophylaxis: PPI. D/C no longer indicated  Lines: N/A Foley:  N/A Code Status:  full code Last date of multidisciplinary goals of care discussion  fianc was updated at bedside 11/10]  Will speak to neurology about possible transfer out of ICU.   Labs:  CBC: Recent Labs  Lab 07/27/21 0031 07/27/21 0046 07/27/21 0500 07/27/21 1223 07/28/21 0519 07/29/21 0647  WBC 19.0*  --  13.9*  --  11.6* 10.3  NEUTROABS 15.0*  --   --   --  8.4* 6.8  HGB 13.2 13.6 11.9* 11.9* 11.5* 11.4*  HCT 39.9 40.0 34.6* 35.0* 34.1* 34.0*  MCV 91.9  --  89.4  --  91.4 90.2  PLT 179  --  319  --  313 334    Basic Metabolic Panel: Recent Labs  Lab 07/27/21 0031 07/27/21 0046 07/27/21 0500 07/27/21 1223 07/28/21 0519 07/28/21 1646 07/29/21 0647  NA 139 140 138 140 137  --  134*  K 3.0* 2.9* 3.8 3.7 3.2*  --  4.1  CL 107 110 109  --  106  --  103  CO2 18*  --  20*  --  23  --  23  GLUCOSE 112* 115* 103*  --  96  --  104*  BUN 11 11 9   --  7  --  9  CREATININE 1.13 1.00 1.00  --  0.87  --  0.77  CALCIUM 8.5*  --  8.1*  --  8.3*  --  8.9  MG 1.7  --  1.9  --   --  2.0 1.9  PHOS  --   --  4.1  --   --  2.3* 2.8   GFR: Estimated Creatinine Clearance: 148 mL/min (by C-G formula based on SCr of 0.77 mg/dL). Recent Labs  Lab 07/27/21 0031 07/27/21 0500 07/27/21 0606 07/28/21 0519 07/29/21 0647  WBC 19.0* 13.9*  --  11.6* 10.3  LATICACIDVEN 4.4*  --  2.8*  --   --     Liver Function Tests: Recent Labs  Lab 07/27/21 0031  AST 38  ALT 38  ALKPHOS 59  BILITOT 0.6  PROT 6.7  ALBUMIN 3.6   No results for input(s): LIPASE, AMYLASE in the last 168 hours. Recent Labs  Lab 07/27/21 0031  AMMONIA 58*    ABG:     Component Value Date/Time   PHART 7.370 07/27/2021 1223   PCO2ART 39.7 07/27/2021 1223   PO2ART 96 07/27/2021 1223   HCO3 22.9 07/27/2021 1223   TCO2 24 07/27/2021 1223   ACIDBASEDEF 2.0 07/27/2021 1223   O2SAT 90.3 07/28/2021 0656     Coagulation Profile: No results for input(s): INR, PROTIME in the last 168 hours.  Cardiac Enzymes: No results for input(s): CKTOTAL, CKMB, CKMBINDEX, TROPONINI in the last 168 hours.  HbA1C: Hgb A1c MFr Bld  Date/Time Value Ref Range Status  07/27/2021 05:00 AM 5.7 (H) 4.8 - 5.6 % Final    Comment:    (NOTE) Pre diabetes:          5.7%-6.4%  Diabetes:              >6.4%  Glycemic control for   <7.0% adults with diabetes     CBG: Recent Labs  Lab 07/28/21 1533 07/28/21 1932 07/28/21 2316 07/29/21 0326 07/29/21 0727  GLUCAP 86 94 103* 122* 123*    Jovita Kussmaul, AGACNP-BC [facility]  PCCM Pgr: [phone]

## 2021-07-30 LAB — CBC
HCT: 36.1 % — ABNORMAL LOW (ref 39.0–52.0)
Hemoglobin: 12.1 g/dL — ABNORMAL LOW (ref 13.0–17.0)
MCH: 30 pg (ref 26.0–34.0)
MCHC: 33.5 g/dL (ref 30.0–36.0)
MCV: 89.4 fL (ref 80.0–100.0)
Platelets: 350 10*3/uL (ref 150–400)
RBC: 4.04 MIL/uL — ABNORMAL LOW (ref 4.22–5.81)
RDW: 13.2 % (ref 11.5–15.5)
WBC: 9.5 10*3/uL (ref 4.0–10.5)
nRBC: 0 % (ref 0.0–0.2)

## 2021-07-30 LAB — GLUCOSE, CAPILLARY
Glucose-Capillary: 112 mg/dL — ABNORMAL HIGH (ref 70–99)
Glucose-Capillary: 87 mg/dL (ref 70–99)
Glucose-Capillary: 89 mg/dL (ref 70–99)
Glucose-Capillary: 91 mg/dL (ref 70–99)
Glucose-Capillary: 94 mg/dL (ref 70–99)
Glucose-Capillary: 99 mg/dL (ref 70–99)

## 2021-07-30 LAB — BASIC METABOLIC PANEL
Anion gap: 10 (ref 5–15)
BUN: 12 mg/dL (ref 6–20)
CO2: 22 mmol/L (ref 22–32)
Calcium: 8.9 mg/dL (ref 8.9–10.3)
Chloride: 102 mmol/L (ref 98–111)
Creatinine, Ser: 0.84 mg/dL (ref 0.61–1.24)
GFR, Estimated: >60 mL/min (ref >60)
Glucose, Bld: 95 mg/dL (ref 70–99)
Potassium: 4 mmol/L (ref 3.5–5.1)
Sodium: 134 mmol/L — ABNORMAL LOW (ref 135–145)

## 2021-07-30 LAB — MAGNESIUM: Magnesium: 2 mg/dL (ref 1.7–2.4)

## 2021-07-30 LAB — PHOSPHORUS: Phosphorus: 4.1 mg/dL (ref 2.5–4.6)

## 2021-07-30 LAB — METHYLMALONIC ACID, SERUM: Methylmalonic Acid, Quantitative: 167 nmol/L (ref 0–378)

## 2021-07-30 MED ORDER — DOCUSATE SODIUM 100 MG PO CAPS: 100.0000 mg | ORAL_CAPSULE | Freq: Two times a day (BID) | ORAL | Status: DC | PRN

## 2021-07-30 MED ORDER — ASPIRIN 81 MG PO CHEW
81.0000 mg | CHEWABLE_TABLET | Freq: Every day | ORAL | Status: DC
  Administered 2021-07-30 – 2021-07-31 (×2): 81 mg via ORAL
  Filled 2021-07-30: qty 1

## 2021-07-30 MED ORDER — ATORVASTATIN CALCIUM 40 MG PO TABS
40.0000 mg | ORAL_TABLET | Freq: Every day | ORAL | Status: DC
  Administered 2021-07-30 – 2021-08-04 (×6): 40 mg via ORAL
  Filled 2021-07-30 (×6): qty 1

## 2021-07-30 MED ORDER — DOCUSATE SODIUM 100 MG PO CAPS
100.0000 mg | ORAL_CAPSULE | Freq: Two times a day (BID) | ORAL | Status: DC
  Administered 2021-07-30 – 2021-08-05 (×13): 100 mg via ORAL
  Filled 2021-07-30 (×13): qty 1

## 2021-07-30 MED ORDER — ADULT MULTIVITAMIN W/MINERALS CH
1.0000 | ORAL_TABLET | Freq: Every day | ORAL | Status: DC
  Administered 2021-07-30 – 2021-08-05 (×7): 1 via ORAL
  Filled 2021-07-30 (×7): qty 1

## 2021-07-30 MED ORDER — POLYETHYLENE GLYCOL 3350 17 G PO PACK: 17.0000 g | PACK | Freq: Every day | ORAL | Status: DC | PRN

## 2021-07-30 MED ORDER — ASPIRIN 81 MG PO CHEW
81.0000 mg | CHEWABLE_TABLET | Freq: Every day | ORAL | Status: DC
  Administered 2021-08-01 – 2021-08-05 (×5): 81 mg via ORAL
  Filled 2021-07-30 (×7): qty 1

## 2021-07-30 MED ORDER — POLYETHYLENE GLYCOL 3350 17 G PO PACK
17.0000 g | PACK | Freq: Every day | ORAL | Status: DC
  Administered 2021-07-30 – 2021-08-05 (×7): 17 g via ORAL
  Filled 2021-07-30 (×7): qty 1

## 2021-07-30 NOTE — Progress Notes (Signed)
In theSTROKE TEAM PROGRESS NOTE   INTERVAL HISTORY Patient is resting in bed.  He appears to be in much better spirits today.  He is smiling and joking  .  His girlfriend is at the bedside.  Neurological exam is unchanged.  He is awake alert interactive.  He is dysarthric.  He has right gaze palsy and right facial weakness and right hemiparesis.  Vital signs are stable.   Neurological exam unchanged Vitals:   07/30/21 1000 07/30/21 1100 07/30/21 1200 07/30/21 1300  BP: (!) 98/59 108/73 98/71 125/83  Pulse:      Resp: (!) 29 (!) 27 (!) 23 (!) 22  Temp:   97.8 F (36.6 C)   TempSrc:   Oral   SpO2:  97% 99%   Weight:      Height:       CBC:  Recent Labs  Lab 07/28/21 0519 07/29/21 0647 07/30/21 0306  WBC 11.6* 10.3 9.5  NEUTROABS 8.4* 6.8  --   HGB 11.5* 11.4* 12.1*  HCT 34.1* 34.0* 36.1*  MCV 91.4 90.2 89.4  PLT 313 334 350   Basic Metabolic Panel:  Recent Labs  Lab 07/29/21 0647 07/29/21 1631 07/30/21 0306  NA 134*  --  134*  K 4.1  --  4.0  CL 103  --  102  CO2 23  --  22  GLUCOSE 104*  --  95  BUN 9  --  12  CREATININE 0.77  --  0.84  CALCIUM 8.9  --  8.9  MG 1.9 2.2 2.0  PHOS 2.8 3.2 4.1    Lipid Panel:  Recent Labs  Lab 07/28/21 0519  CHOL 199  TRIG 230*  HDL 37*  CHOLHDL 5.4  VLDL 46*  LDLCALC 116*     [id number]:  Recent Labs  Lab 07/27/21 0500  HGBA1C 5.7*   Urine Drug Screen:  Recent Labs  Lab 07/27/21 0606  LABOPIA NONE DETECTED  COCAINSCRNUR NONE DETECTED  LABBENZ NONE DETECTED  AMPHETMU NONE DETECTED  THCU POSITIVE*  LABBARB NONE DETECTED    Alcohol Level  Recent Labs  Lab 07/27/21 0031  ETH <10    IMAGING past 24 hours DG Swallowing Func-Speech Pathology  Result Date: 07/29/2021 Table formatting from the original result was not included. Objective Swallowing Evaluation: Type of Study: MBS-Modified Barium Swallow Study  Patient Details Name: Lorena Clearman MRN: [id number] Date of Birth: 30-Sep-1995 Today's Date: 07/29/2021 Time:  SLP Start Time (ACUTE ONLY): 1330 -SLP Stop Time (ACUTE ONLY): 1346 SLP Time Calculation (min) (ACUTE ONLY): 16 min Past Medical History: No past medical history on file. Past Surgical History: Past Surgical History: Procedure Laterality Date  IR ANGIO EXTRACRAN SEL COM CAROTID INNOMINATE UNI R MOD SED  07/27/2021  IR CT HEAD LTD  07/27/2021  IR INTRA CRAN STENT  07/27/2021  IR PERCUTANEOUS ART THROMBECTOMY/INFUSION INTRACRANIAL INC DIAG ANGIO  07/27/2021  RADIOLOGY WITH ANESTHESIA N/A 07/27/2021  Procedure: IR WITH ANESTHESIA;  Surgeon: Julieanne Cotton, MD;  Location: [facility] OR;  Service: Radiology;  Laterality: N/A; HPI: 25 y.o M presenting with acute onset seizures after having significant headaches for three days and intake of alcohol and marajuana. Imaging revealed Acute infarcts involving the right thalamus, medial aspect of the  right occipital and temporal lobes, including right hippocampus,  centrally in the midbrain, on the left side of the pons and in the  bilateral cerebellar hemispheres. Petechial hemorrhage seen in the  left inferior cerebellar hemisphere right occipital lobe. Intubated 11/7-11/8  Subjective:  Pt awake and alert, cheerfully greeting each member of the careteam  Recommendations for follow up therapy are one component of a multi-disciplinary discharge planning process, led by the attending physician.  Recommendations may be updated based on patient status, additional functional criteria and insurance authorization. Assessment / Plan / Recommendation Clinical Impressions 07/29/2021 Clinical Impression Pt was seen for an MBS. Overall, pt presents with mild oral dysphagia d/t neuromuscular deficits resulting in anterior oral spillage and weak lingual manipulation. Pharyngeal phase WFL across all consistencies. Pt drooling from right side before test began and presents with mild lingual edema. With thin liquid from cup, pt exhibited mild anterior oral spillage from right side which was mitigated  with administration through straw. Pt exhibited no oral phase impairments with puree and only mildly prolonged mastication with regular solid. Pt also exhibited mild oral residue coating tongue across consistencies. With pill with thin liquid, pt exhibited anterior oral spillage of thin liquid and weak lingual manipulation, unable to propel pill to posterior oral cavity. Weak lingual manipulation also observed with pill administered in puree, resulting in pill expectoration. Pt care partner reports pt cannot take whole pills at baseline. Recommend regular/thin liquid diet, administering medications crushed in puree. SLP will continue to follow pt acutely for management of diet tolerance. SLP Visit Diagnosis Dysphagia, oral phase (R13.11) Attention and concentration deficit following -- Frontal lobe and executive function deficit following -- Impact on safety and function Mild aspiration risk   Treatment Recommendations 07/29/2021 Treatment Recommendations Therapy as outlined in treatment plan below   Prognosis 07/29/2021 Prognosis for Safe Diet Advancement Good Barriers to Reach Goals Severity of deficits Barriers/Prognosis Comment -- Diet Recommendations 07/29/2021 SLP Diet Recommendations Regular solids;Thin liquid Liquid Administration via Cup;Straw Medication Administration Crushed with puree Compensations Minimize environmental distractions;Slow rate Postural Changes Remain semi-upright after after feeds/meals (Comment);Seated upright at 90 degrees   Other Recommendations 07/29/2021 Recommended Consults -- Oral Care Recommendations Oral care BID Other Recommendations -- Follow Up Recommendations Acute inpatient rehab (3hours/day) Assistance recommended at discharge PRN Functional Status Assessment Patient has had a recent decline in their functional status and demonstrates the ability to make significant improvements in function in a reasonable and predictable amount of time. Frequency and Duration  07/29/2021  Speech Therapy Frequency (ACUTE ONLY) min 2x/week Treatment Duration 2 weeks   Oral Phase 07/29/2021 Oral Phase Impaired Oral - Pudding Teaspoon -- Oral - Pudding Cup -- Oral - Honey Teaspoon -- Oral - Honey Cup -- Oral - Nectar Teaspoon -- Oral - Nectar Cup -- Oral - Nectar Straw -- Oral - Thin Teaspoon -- Oral - Thin Cup Right anterior bolus loss Oral - Thin Straw WFL Oral - Puree WFL Oral - Mech Soft -- Oral - Regular WFL Oral - Multi-Consistency -- Oral - Pill Weak lingual manipulation;Right anterior bolus loss Oral Phase - Comment --  Pharyngeal Phase 07/29/2021 Pharyngeal Phase WFL Pharyngeal- Pudding Teaspoon -- Pharyngeal -- Pharyngeal- Pudding Cup -- Pharyngeal -- Pharyngeal- Honey Teaspoon -- Pharyngeal -- Pharyngeal- Honey Cup -- Pharyngeal -- Pharyngeal- Nectar Teaspoon -- Pharyngeal -- Pharyngeal- Nectar Cup -- Pharyngeal -- Pharyngeal- Nectar Straw -- Pharyngeal -- Pharyngeal- Thin Teaspoon -- Pharyngeal -- Pharyngeal- Thin Cup -- Pharyngeal -- Pharyngeal- Thin Straw -- Pharyngeal -- Pharyngeal- Puree -- Pharyngeal -- Pharyngeal- Mechanical Soft -- Pharyngeal -- Pharyngeal- Regular -- Pharyngeal -- Pharyngeal- Multi-consistency -- Pharyngeal -- Pharyngeal- Pill -- Pharyngeal -- Pharyngeal Comment --  No flowsheet data found. Royce Macadamia 07/29/2021, 3:39 PM  PHYSICAL EXAM [name] is not in distress.. . Afebrile. Head is nontraumatic. Neck is supple without bruit.    Cardiac exam no murmur or gallop. Lungs are clear to auscultation. Distal pulses are well felt. . Neurological Exam :    Mental status/Cognition: Awake alert.  Oriented to time place and person.  Follows commands well. Speech/language: Moderate dysarthria but can be understood.  Voice is slightly hypophonic.  Follows commands well. Cranial nerves/brainstem: Pupils 20mm, equal round and reactive to light.  Right gaze palsy with subjective diplopia.  Disconjugate horizontal  gaze.  Preserved vertical gaze.  No nystagmus Moderate right lower facial weakness.  Tongue midline.   Motor:  Right hemiparesis with right upper extremity 3/5 strength with distal hand weakness.  Right lower extremity 2/5 strength.  Left upper extremity 4/5 strength with good grip.  Left lower extremity 4/5 strength.  Tone is normal. Sensation:  Diminished touch pinprick sensation in the right face and upper and lower extremities.   Coordination/Complex Motor:  Impaired on the right proportionate to the weakness.  Normal on the left  ASSESSMENT/PLAN [name] is a 25 y.o. male with  no significant PMH who presents with shaking and unresponsiveness. Patient is intubated and unable to provide any history. Most of the history obtained from sister and fiance over phone. Per fiance, he has been reporting that his head hurts for the last 3-4 days along with feeling cold. Today, they smoked marijuana and he took 4 shots of Vodka. They were having intercourse when he fell out and the left side of his body went numb. He was sweating profusely and kept saying "my brain" and then started having seizures. This happened about 30 mins to an hours after taking marijuana. No prior hx of seizures. He has been reporting headache for the last 3-4 days. Today started throwing up. He threw up a lot today. Spends a lot of time in a shed but comes to sister's home every night to sleep. The shed does not have proper ventilation, it is full of stuff but nothing toxic that sister or fiance is aware of. No obvious exposure to Carbon Monoxide. No fever, no chills but has been feeling cold.   The fiance immediately called EMS for concern for seizure like activity. Per notes, the seizure like activity stopped enroute by the time EMS got an IV so he was not given any Versed. It resumed in the ED and he was given ativan 4mg , Keppra 2G in the ED with persistent shaking and was intubated with rocuronium and etomidate and  started on propofol.   On my evaluation, he has tongue movements and twitching along with jerks mostly in the LUE and RLE. Jerking appears arhythmic on my evaluation and ?myoclonic.   No recent water fasts, he is not a regular drinker, has used marijuana in the past without any issues. Fiance and another friend also smoked the same marijuana without any issues. He was fine all day otherwise. No recent head injury with loss of consciousness, no family history of seizures.  CT head was negative for a large hypodensity concerning for a large territory infarct or hyperdensity concerning for an ICH.  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 In addition, imaging notable for a distal basilar thrombus, R PCA P2 occlusion and Left PCA P1 occlusion. A code stroke was initiated and case discussed with Dr. for evaluation for potential thrombectomy. Dr. Corliss Skains discussed  risks and benefits and the need for thrombectomy with patient's sister Fabio Asa over the phone with the fiance listening in over the speaker. The sister consented for the surgery and he was taken to IR for a stent assisted angioplasty of proximal Left VA.     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   Intermittent involuntary movements which appear to be stimulus sensitive myoclonus versus tremors of unclear etiology which have resolved.  Doubt seizures.  Code Stroke  CT head No acute abnormality.  Small vessel disease. Atrophy.  ASPECTS 10.     CTA head & neck   1. 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 2. Subtle irregular nonocclusive filling defect involving the distal basilar  artery/basilar tip, consistent with associated distal embolic disease. Adjacent superior cerebellar arteries are not visualized at their origins, and could be occluded. 3. Associated partially occlusive thrombus with severe stenosis involving the left P1 segment. 4. Additional distal right P2 embolic occlusion. 5. Wide patency of both carotid artery systems and anterior circulation. 6. Negative CT venogram.  No evidence for dural sinus thrombosis.  Cerebral angio  S/P bilateral vert  artery and RT common carotid arteriograms . RT CFA approach. S/P revascularization of occuded distal basilar artery and both PCAs with x1 pass with 40 mm solitaireX retriever and contact  Aspiration achieving a TICI 2C revascularization.  Mechanical thrombectomy stent assisted angioplasty for symptomatic prox LT VA  Dissection with distal protection.   Post IR CT  No acute intracranial pathology. No evidence of intracranial hemorrhage. EEG.  Excessive beta activity.  No seizures MRI multifocal infarcts involving bilateral superior cerebellum, left pons, central midbrain, right thalamus, medial temporal and occipital lobes  2D Echo EF 60-65%, normal LA, no IA shunt   LDL No results found for requested labs within last 28315 hours. HgbA1c 5.7 VTE prophylaxis - scd EEG: within normal limits. No seizures or epileptiform activity noted.     Diet   Diet regular Room service appropriate? Yes; Fluid consistency: Thin   No antithrombotic prior to admission, now on aspirin 81 mg daily and Brilinta (ticagrelor) 90 mg bid.   Therapy recommendations:  CLR Disposition:  pending   Hypertension Home meds:   none Unstable Permissive hypertension (OK if < 220/120) but gradually normalize in 5-7 days Long-term BP goal normotensive  Hyperlipidemia Home meds:  none LDL No results found for requested labs within last 17616 hours., goal < 70 Add lipitor 40mg    High intensity statin   Continue statin at  discharge  HgbA1c 5.7, goal < 7.0 CBGs Recent Labs    07/30/21 0352 07/30/21 0841 07/30/21 1139  GLUCAP 94 112* 87    SSI  Other Stroke Risk Factors   Cigarette smoker     Substance abuse - UDS:  THC POSITIVE, Cocaine NONE DETECTED. Patient advised to stop using due to stroke risk.  Obesity, Body mass index is 42.38 kg/m., BMI >/= 30 associated with increased stroke risk, recommend weight loss, diet and exercise as appropriate   Hx stroke/TIA    Other Active Problems Respiratory failure   Hospital day # 4 Continue mobilization out of bed and ongoing therapies.  Continue Zoloft for depression.  Transfer to neurology floor bed when available.  Continue aspirin and Brilinta for left  vertebral artery stent.  Discussed with his girlfriend and patient at the bedside and answered questions.  Discussed with Dr. Corliss Skains and Dr. Merrily Pew critical care medicine. This patient is critically ill and at significant risk of neurological worsening, death and care requires constant monitoring of vital signs, hemodynamics,respiratory and cardiac monitoring, extensive review of multiple databases, frequent neurological assessment, discussion with family, other specialists and medical decision making of high complexity.I have made any additions or clarifications directly to the above note.This critical care time does not reflect procedure time, or teaching time or supervisory time of PA/NP/Med Resident etc but could involve care discussion time.  I spent 30 minutes of neurocritical care time  in the care of  this patient.     Delia Heady, MD Medical Director [facility] Pager: [phone] 07/30/2021 1:18 PM     To contact Stroke Continuity provider, please refer to [email or web address]. After hours, contact General Neurology

## 2021-07-30 NOTE — Progress Notes (Signed)
Received patient from 4N ICU via bed; oriented patient to room and unit routine.  Fall safety reviewed. Bed low and locked with bed alarm set.

## 2021-07-30 NOTE — Progress Notes (Signed)
Physical Therapy Treatment Patient Details Name: Roy [name] MRN: [id number] DOB: Aug 11, 1996 Today's Date: 07/30/2021   History of Present Illness Pt is 25 yo male who presents seizures while smoking marijuana and drinking EtOH with girlfriend at home.  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 No significant PMH    PT Comments    Patient less labile this session. Patient motivated to participate and "learn to walk again". Patient required up to maxA for supine>sit this date. Performed sit to stand from EOB to [facility] with modA+2 and in stedy flaps with minA. Multimodal cues for bringing attention to UEs in standing. Continues to demo cognitive deficits. Continue to recommend CIR level therapies to assist with maximizing functional mobility and safety.    Recommendations for follow up therapy are one component of a multi-disciplinary discharge planning process, led by the attending physician.  Recommendations may be updated based on patient status, additional functional criteria and insurance authorization.  Follow Up Recommendations  Acute inpatient rehab (3hours/day)     Assistance Recommended at Discharge Frequent or constant Supervision/Assistance  Equipment Recommendations  Other (comment) (TBD)    Recommendations for Other Services       Precautions / Restrictions Precautions Precautions: Fall Precaution Comments: BP <140 Restrictions Weight Bearing Restrictions: No     Mobility  Bed Mobility Overal bed mobility: Needs Assistance Bed Mobility: Rolling;Sidelying to Sit;Sit to Supine Rolling: Min assist Sidelying to sit: Max assist   Sit to supine: Max assist;+2 for physical assistance;+2 for safety/equipment   General bed mobility comments: requires step by step cues for sequencing to roll and push trunk up. MaxA+2 to return to supine    Transfers Overall transfer level: Needs assistance Equipment used:  Ambulation equipment used Antony Salmon) Transfers: Sit to/from Stand Sit to Stand: Mod assist;+2 physical assistance;+2 safety/equipment;Min assist           General transfer comment: modA+2 to stand from EOB to [facility] with cues for hip extension. MinA from stedy flaps. Cues for attend to hands to grasp stedy bar. Able to weight shift L/R and lift each foot off stedy foot plate. Sit to stand x 5 from [facility]    Ambulation/Gait             Pre-gait activities: weight shifting, lifting L/R foot off stedy foot plate     Stairs             Wheelchair Mobility    Modified Rankin (Stroke Patients Only) Modified Rankin (Stroke Patients Only) Pre-Morbid Rankin Score: No symptoms Modified Rankin: Severe disability     Balance Overall balance assessment: Needs assistance Sitting-balance support: Bilateral upper extremity supported;Feet supported Sitting balance-Leahy Scale: Poor Sitting balance - Comments: able to sit EOB with close supervision x 1 minute with cues for upright posture   Standing balance support: Bilateral upper extremity supported;Reliant on assistive device for balance Standing balance-Leahy Scale: Poor                              Cognition Arousal/Alertness: Awake/alert Behavior During Therapy: Anxious Overall Cognitive Status: Impaired/Different from baseline Area of Impairment: Attention;Safety/judgement;Following commands;Awareness;Problem solving                   Current Attention Level: Sustained   Following Commands: Follows one step commands inconsistently Safety/Judgement: Decreased awareness of safety;Decreased awareness of deficits Awareness: Intellectual Problem Solving: Slow processing;Decreased initiation;Difficulty sequencing;Requires tactile cues;Requires verbal  cues General Comments: less labile this session and easy to redirect this session. Motivated to participate. Requires cues to attend to task         Exercises Other Exercises Other Exercises: weight shifting L /R Other Exercises: Small marching in stedy Other Exercises: Reaching with each UE in standing    General Comments        Pertinent Vitals/Pain Pain Assessment: No/denies pain    Home Living                          Prior Function            PT Goals (current goals can now be found in the care plan section) Acute Rehab PT Goals Patient Stated Goal: to get stronger PT Goal Formulation: With patient Time For Goal Achievement: 08/11/21 Potential to Achieve Goals: Good Progress towards PT goals: Progressing toward goals    Frequency    Min 4X/week      PT Plan Current plan remains appropriate    Co-evaluation              AM-PAC PT "6 Clicks" Mobility   Outcome Measure  Help needed turning from your back to your side while in a flat bed without using bedrails?: A Lot Help needed moving from lying on your back to sitting on the side of a flat bed without using bedrails?: A Lot Help needed moving to and from a bed to a chair (including a wheelchair)?: Total Help needed standing up from a chair using your arms (e.g., wheelchair or bedside chair)?: Total Help needed to walk in hospital room?: Total Help needed climbing 3-5 steps with a railing? : Total 6 Click Score: 8    End of Session Equipment Utilized During Treatment: Gait belt Activity Tolerance: Patient tolerated treatment well Patient left: in bed;with call bell/phone within reach;with bed alarm set Nurse Communication: Mobility status PT Visit Diagnosis: Unsteadiness on feet (R26.81);Muscle weakness (generalized) (M62.81);Difficulty in walking, not elsewhere classified (R26.2);Other abnormalities of gait and mobility (R26.89);Ataxic gait (R26.0);Other symptoms and signs involving the nervous system (R29.898)     Time: 1446-1510 PT Time Calculation (min) (ACUTE ONLY): 24 min  Charges:  $Therapeutic Exercise: 23-37 mins                      Oseph Imburgia A. Dan Humphreys PT, DPT Acute Rehabilitation Services Pager [phone] Office [phone]    Viviann Spare 07/30/2021, 5:25 PM

## 2021-07-30 NOTE — Progress Notes (Signed)
Inpatient Rehab Admissions Coordinator:   I do not have a bed for this Pt. Today. I spoke with Pt. And his aunt regarding potential CIR admission, and they state interest. They are still working on plan for 24/7 support at discharge. I will not admit until dispo is confirmed.    Megan Salon, MS, CCC-SLP Rehab Admissions Coordinator  [phone] (celll) [phone] (office)

## 2021-07-31 DIAGNOSIS — E7800 Pure hypercholesterolemia, unspecified: Secondary | ICD-10-CM

## 2021-07-31 DIAGNOSIS — I6312 Cerebral infarction due to embolism of basilar artery: Principal | ICD-10-CM

## 2021-07-31 LAB — GLUCOSE, CAPILLARY
Glucose-Capillary: 99 mg/dL (ref 70–99)
Glucose-Capillary: 93 mg/dL (ref 70–99)
Glucose-Capillary: 106 mg/dL — ABNORMAL HIGH (ref 70–99)
Glucose-Capillary: 93 mg/dL (ref 70–99)
Glucose-Capillary: 102 mg/dL — ABNORMAL HIGH (ref 70–99)

## 2021-07-31 LAB — MAGNESIUM: Magnesium: 2 mg/dL (ref 1.7–2.4)

## 2021-07-31 LAB — CBC
HCT: 37.2 % — ABNORMAL LOW (ref 39.0–52.0)
Hemoglobin: 13 g/dL (ref 13.0–17.0)
MCH: 30.9 pg (ref 26.0–34.0)
MCHC: 34.9 g/dL (ref 30.0–36.0)
MCV: 88.4 fL (ref 80.0–100.0)
Platelets: 405 10*3/uL — ABNORMAL HIGH (ref 150–400)
RBC: 4.21 MIL/uL — ABNORMAL LOW (ref 4.22–5.81)
RDW: 13.1 % (ref 11.5–15.5)
WBC: 10.8 10*3/uL — ABNORMAL HIGH (ref 4.0–10.5)
nRBC: 0 % (ref 0.0–0.2)

## 2021-07-31 LAB — BASIC METABOLIC PANEL
Anion gap: 11 (ref 5–15)
BUN: 14 mg/dL (ref 6–20)
CO2: 23 mmol/L (ref 22–32)
Calcium: 9.3 mg/dL (ref 8.9–10.3)
Chloride: 99 mmol/L (ref 98–111)
Creatinine, Ser: 0.96 mg/dL (ref 0.61–1.24)
GFR, Estimated: >60 mL/min (ref >60)
Glucose, Bld: 91 mg/dL (ref 70–99)
Potassium: 3.8 mmol/L (ref 3.5–5.1)
Sodium: 133 mmol/L — ABNORMAL LOW (ref 135–145)

## 2021-07-31 MED ORDER — THIAMINE HCL 100 MG PO TABS
100.0000 mg | ORAL_TABLET | Freq: Every day | ORAL | Status: DC
  Administered 2021-08-01 – 2021-08-05 (×5): 100 mg via ORAL
  Filled 2021-07-31 (×5): qty 1

## 2021-07-31 MED ORDER — FOLIC ACID 1 MG PO TABS
1.0000 mg | ORAL_TABLET | Freq: Every day | ORAL | Status: DC
  Administered 2021-07-31 – 2021-08-05 (×6): 1 mg via ORAL
  Filled 2021-07-31 (×6): qty 1

## 2021-07-31 MED ORDER — ENOXAPARIN SODIUM 40 MG/0.4ML IJ SOSY
40.0000 mg | PREFILLED_SYRINGE | INTRAMUSCULAR | Status: DC
  Administered 2021-07-31 – 2021-08-04 (×5): 40 mg via SUBCUTANEOUS
  Filled 2021-07-31 (×5): qty 0.4

## 2021-07-31 NOTE — Progress Notes (Signed)
Inpatient Rehab Admissions Coordinator:    I provided a self-pay cost estimate for CIR to Pt. And left paperwork in his room. I also spoke with Pt.'s significant other, Shicora, who states that family is meeting this weekend to discuss working out a plan for 24/7 support at discharge. I will touch base with her on Monday to see if that if family is able to work out a plan to provide 24/7 care following CIR.   Megan Salon, MS, CCC-SLP Rehab Admissions Coordinator  [phone] (celll) [phone] (office)

## 2021-07-31 NOTE — Progress Notes (Signed)
Physical Therapy Evaluation Patient Details Name: Roy [name] MRN: [id number] DOB: 10-12-1995 Today's Date: 07/31/2021  History of Present Illness  Pt is 25 yo male who presents seizures while smoking marijuana and drinking EtOH with girlfriend at home.  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 No significant PMH  Clinical Impression  Pt received in supine, eager to get OOB and with good participation and tolerance for bed mobility and transfer training using Stedy. Pt able to minimally weight shift within [place] however with significant Rt lean and RLE buckling with marching attempt so utilized [place] for chair transfer this date. Pt able to perform functional mobility tasks with up to +2 modA to stand and maxA for bed mobility with increased time to perform. Pt unable to grip utensils or bring food safely to his mouth when food tray arrived so NT assisting him to eat at end of session. Plan to continue transfer training and pre-gait tasks next session. Pt continues to benefit from PT services to progress toward functional mobility goals.        Recommendations for follow up therapy are one component of a multi-disciplinary discharge planning process, led by the attending physician.  Recommendations may be updated based on patient status, additional functional criteria and insurance authorization.  Follow Up Recommendations Acute inpatient rehab (3hours/day)    Assistance Recommended at Discharge Frequent or constant Supervision/Assistance  Functional Status Assessment    Equipment Recommendations  Other (comment) (TBD)    Recommendations for Other Services       Precautions / Restrictions Precautions Precautions: Fall Precaution Comments: permissive HTN <220/120, R hemi Restrictions Weight Bearing Restrictions: No      Mobility  Bed Mobility Overal bed mobility: Needs Assistance Bed Mobility: Rolling;Sidelying to Sit;Sit to  Supine Rolling: Mod assist Sidelying to sit: Max assist;HOB elevated       General bed mobility comments: requires step by step cues for sequencing to roll and push trunk up when transferring to L EOB, pt has difficulty letting going of side rail with RUE while pushing trunk to upright.    Transfers Overall transfer level: Needs assistance Equipment used: Ambulation equipment used Antony Salmon) Transfers: Sit to/from Stand Sit to Stand: Mod assist;+2 physical assistance;+2 safety/equipment;Min assist           General transfer comment: modA+2 to stand from elevated EOB to [facility] with cues for hip extension. MinA from stedy flaps. Cues for attend to hands to grasp stedy bar but pt frequently forgetting/unable to maintain grip bilaterally and needs trunk support also while sitting on Stedy flaps. Able to weight shift L/R and lift each foot off stedy foot plate but Rt lean when weight shifting this date and some Rt buckling. Pt requesting to sit up in chair as food arriving, needing modA +2 to safely sit to low armchair surface in room.    Ambulation/Gait             Pre-gait activities: pre-gait weight shifting, R LE buckling within stedy x3 reps ea    Stairs            Wheelchair Mobility    Modified Rankin (Stroke Patients Only) Modified Rankin (Stroke Patients Only) Pre-Morbid Rankin Score: No symptoms Modified Rankin: Severe disability     Balance Overall balance assessment: Needs assistance Sitting-balance support: Bilateral upper extremity supported;Feet supported Sitting balance-Leahy Scale: Poor Sitting balance - Comments: mostly min guard to close Supervision, initally had posterior lean upon sitting needing up  to minA, pt with difficulty supporting trunk/posture with BUE due to B hand/arm weakness   Standing balance support: Bilateral upper extremity supported;Reliant on assistive device for balance Standing balance-Leahy Scale: Poor Standing balance comment:  Rt lean with weight shifting in [place], needs +2 external support due to difficulty maintaining grip on Stedy rail                             Pertinent Vitals/Pain Pain Assessment: Faces Faces Pain Scale: Hurts a little bit Pain Location: B hands when attempting to open palm to grip Stedy (pt with IV on back on Rt hand, may be related) Pain Descriptors / Indicators: Discomfort;Grimacing Pain Intervention(s): Monitored during session;Repositioned;Limited activity within patient's tolerance    Home Living                          Prior Function                       Hand Dominance        Extremity/Trunk Assessment                Communication      Cognition Arousal/Alertness: Awake/alert Behavior During Therapy: Anxious Overall Cognitive Status: Impaired/Different from baseline Area of Impairment: Attention;Safety/judgement;Following commands;Awareness;Problem solving                   Current Attention Level: Sustained   Following Commands: Follows one step commands inconsistently Safety/Judgement: Decreased awareness of safety;Decreased awareness of deficits Awareness: Intellectual Problem Solving: Slow processing;Difficulty sequencing;Requires tactile cues;Requires verbal cues General Comments: Pt motivated to participate. Requires cues to attend to task, mild impulsivity but able to be redirected.        General Comments General comments (skin integrity, edema, etc.): VSS on RA per chart review, no acute s/sx distress, NT in room assisting pt to eat at end of session due to pt unable to coordinate/bring food to mouth safely with BUE weakness, especially difficulty gripping utensils and possible motor deficit vs ataxia.    Exercises Other Exercises Other Exercises: weight shifting L /R Other Exercises: Small marching in stedy Other Exercises: supine BLE AAROM: heel slides, hip abduction, ankle pumps x5-10 reps ea Other  Exercises: seated BLE AROM: hip flexion, LAQ x 5 reps ea Other Exercises: BUE AROM: elbow flex/ext x 10 reps ea and gross grasp/finger extension x5 reps ea   Assessment/Plan    PT Assessment    PT Problem List         PT Treatment Interventions      PT Goals (Current goals can be found in the Care Plan section)  Acute Rehab PT Goals Patient Stated Goal: to get stronger PT Goal Formulation: With patient Time For Goal Achievement: 08/11/21    Frequency Min 4X/week   Barriers to discharge        Co-evaluation               AM-PAC PT "6 Clicks" Mobility  Outcome Measure Help needed turning from your back to your side while in a flat bed without using bedrails?: A Lot Help needed moving from lying on your back to sitting on the side of a flat bed without using bedrails?: A Lot Help needed moving to and from a bed to a chair (including a wheelchair)?: Total (+2 maxA) Help needed standing up from a chair using your arms (e.g.,  wheelchair or bedside chair)?: Total (+2 maxA from low chair height) Help needed to walk in hospital room?: Total Help needed climbing 3-5 steps with a railing? : Total 6 Click Score: 8    End of Session Equipment Utilized During Treatment: Gait belt Activity Tolerance: Patient tolerated treatment well Patient left: with call bell/phone within reach;in chair;with nursing/sitter in room (NT in room to feed pt, she states she will set chair alarm (pad in chair, just too many obstacles in way for PTA to set it while pt being fed)) Nurse Communication: Mobility status;Need for lift equipment [facility] with +2 vs mechanical lift to get back to bed) PT Visit Diagnosis: Unsteadiness on feet (R26.81);Muscle weakness (generalized) (M62.81);Difficulty in walking, not elsewhere classified (R26.2);Other abnormalities of gait and mobility (R26.89);Ataxic gait (R26.0);Other symptoms and signs involving the nervous system (R29.898)    Time: 6213-0865 PT Time  Calculation (min) (ACUTE ONLY): 24 min   Charges:    PT Treatments $Therapeutic Exercise: 8-22 mins $Therapeutic Activity: 8-22 mins        Daylin Gruszka P., PTA Acute Rehabilitation Services Pager: [phone] Office: [phone]   [place]us [place] 07/31/2021, 12:42 PM

## 2021-07-31 NOTE — Progress Notes (Addendum)
STROKE TEAM PROGRESS NOTE   ATTENDING NOTE: I reviewed above note and agree with the assessment and plan. Pt was seen and examined.   25 year old male with history of obesity admitted for unresponsiveness and shaking jerking movements.  This symptoms happened after THC and alcohol abuse, and during sexual activity he fell, had left-sided numbness, diaphoresis, headache, nausea vomiting and seizure-like activity.  He received Versed Ativan and Keppra for presumed seizure activity.  Intubated in the ER, CT negative, CT head and neck showed left V1 dissection with thrombus formation, distal basilar artery thrombosis, right P2 and left P1 occlusion due to thrombus.  Status post IR with TICI2c of BA and PCAs, also left VA stenting.  MRI showed bilateral cerebellum, left pontine, right thalamus, central midbrain and right PCA scattered infarcts.  EF 60 to 65%, A1c 5.7, LDL 116.  UDS positive for THC, B12= 288, creatinine 0.84.  Neuro - awake, alert, eyes open, orientated to age, place, year but stated December on month instead of November. No aphasia, however, moderate dysarthria, following all simple commands. Able to name and repeat in dysarthric voice. Left INO, right gaze right eye nystagmus, b/l upward gaze with up beat nystagmus. Visual field full, PERRL. Mild right facial droop. Tongue protrusion to the right slightly. LUE proximal 2/5, bicep 4/5, tricep 2+/5, finger grip 4-/5. RUE proximal 3/5, bicep 4/5, tricep 3/5, finger grip 4-/5. BLE proximal 3/5 and distal ankle DF 4+/5. Sensation decreased on the left, b/l FTN ataxic proportional to the weakness, gait not tested.   Etiology for patient stroke likely due to left V1 dissection and thrombus formation.  Currently patient symptom much improved although still has significant neurological deficit as described above.  Continue aspirin and Brilinta for VA stent and stroke prevention.  Continue Lipitor 40.  Continue B1/folate acid/multivitamin for alcohol  abuse.  Patient still has core track tube placed, once p.o. adequate, will DC core track.  Encourage p.o. intake.  PT/OT recommend CIR.  For detailed assessment and plan, please refer to above as I have made changes wherever appropriate.   Marvel Plan, MD PhD Stroke Neurology 07/31/2021 3:01 PM    INTERVAL HISTORY His girlfriend is at the bedside.  He is sitting up in bed practicing grabbing a cup off the table with his right hand. No new neurological events overnight. He is laughing and smiling. He is asking for his cortrak tube to come out since he has been eating and drinking well. He also is complaining about the room being hot and requests a fan   Vitals:   07/30/21 2337 07/31/21 0448 07/31/21 0500 07/31/21 0828  BP: 140/88 128/77  (!) 130/97  Pulse: 74 94  (!) 110  Resp: 19 18  20   Temp: 98.2 F (36.8 C) 98.9 F (37.2 C)  98.2 F (36.8 C)  TempSrc: Oral   Oral  SpO2: 100% 96%  98%  Weight:   101.5 kg   Height:       CBC:  Recent Labs  Lab 07/28/21 0519 07/29/21 0647 07/30/21 0306 07/31/21 0324  WBC 11.6* 10.3 9.5 10.8*  NEUTROABS 8.4* 6.8  --   --   HGB 11.5* 11.4* 12.1* 13.0  HCT 34.1* 34.0* 36.1* 37.2*  MCV 91.4 90.2 89.4 88.4  PLT 313 334 350 405*   Basic Metabolic Panel:  Recent Labs  Lab 07/29/21 1631 07/30/21 0306 07/31/21 0324  NA  --  134* 133*  K  --  4.0 3.8  CL  --  102 99  CO2  --  22 23  GLUCOSE  --  95 91  BUN  --  12 14  CREATININE  --  0.84 0.96  CALCIUM  --  8.9 9.3  MG 2.2 2.0 2.0  PHOS 3.2 4.1  --    Lipid Panel:  Recent Labs  Lab 07/28/21 0519  CHOL 199  TRIG 230*  HDL 37*  CHOLHDL 5.4  VLDL 46*  LDLCALC 116*   HgbA1c:  Recent Labs  Lab 07/27/21 0500  HGBA1C 5.7*   Urine Drug Screen:  Recent Labs  Lab 07/27/21 0606  LABOPIA NONE DETECTED  COCAINSCRNUR NONE DETECTED  LABBENZ NONE DETECTED  AMPHETMU NONE DETECTED  THCU POSITIVE*  LABBARB NONE DETECTED    Alcohol Level  Recent Labs  Lab 07/27/21 0031  ETH  <10    IMAGING past 24 hours No results found.  PHYSICAL EXAM [name] is not in distress. Afebrile. Head is nontraumatic. Neck is supple. Cardiac exam no murmur or gallop. Lungs are clear to auscultation. Distal pulses are well felt. . Neurological Exam :    Mental status/Cognition: Awake alert.  Oriented to time place and person.  Follows commands well. Speech/language: Moderate dysarthria but can be understood.  Voice is slightly hypophonic. Cranial nerves/brainstem: Pupils 73mm, equal round and reactive to light.  Right gaze palsy with subjective diplopia.  Disconjugate horizontal gaze.  Preserved vertical gaze.  No nystagmus Moderate right lower facial weakness.  Tongue midline.   Motor:  Right hemiparesis with right upper extremity 3/5 strength with distal hand weakness.  Right lower extremity 2/5 strength.  Left upper extremity 4/5 strength with good grip.  Left lower extremity 4/5 strength.  Tone is normal. Sensation:  Diminished touch pinprick sensation in the right face and upper and lower extremities.   Coordination/Complex Motor:  Ataxia in left FTN  ASSESSMENT/PLAN  [name] is a 25 y.o. male with  no significant PMH who presents with shaking and unresponsiveness. Patient is intubated and unable to provide any history. Most of the history obtained from sister and fiance over phone. Per fiance, he has been reporting that his head hurts for the last 3-4 days along with feeling cold. Today, they smoked marijuana and he took 4 shots of Vodka. They were having intercourse when he fell out and the left side of his body went numb. He was sweating profusely and kept saying "my brain" and then started having seizures. This happened about 30 mins to an hours after taking marijuana. No prior hx of seizures. He has been reporting headache for the last 3-4 days. Today started throwing up. He threw up a lot today. Spends a lot of time in a shed but comes  to sister's home every night to sleep. The shed does not have proper ventilation, it is full of stuff but nothing toxic that sister or fiance is aware of. No obvious exposure to Carbon Monoxide. No fever, no chills but has been feeling cold.   The fiance immediately called EMS for concern for seizure like activity. Per notes, the seizure like activity stopped enroute by the time EMS got an IV so he was not given any Versed. It resumed in the ED and he was given ativan 4mg , Keppra 2G in the ED with persistent shaking and was intubated with rocuronium and etomidate and started on propofol.   On my evaluation, he has tongue movements and twitching along with jerks mostly in the LUE and RLE.  Jerking appears arhythmic on my evaluation and ?myoclonic.   No recent water fasts, he is not a regular drinker, has used marijuana in the past without any issues. Fiance and another friend also smoked the same marijuana without any issues. He was fine all day otherwise. No recent head injury with loss of consciousness, no family history of seizures.   CT head was negative for a large hypodensity concerning for a large territory infarct or hyperdensity concerning for an ICH.   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 In addition, imaging notable for a distal basilar thrombus, R PCA P2 occlusion and Left PCA P1 occlusion. A code stroke was initiated and case discussed with Dr. Corliss Skains for evaluation for potential thrombectomy. Dr. Corliss Skains discussed risks and benefits and the need for thrombectomy with patient's sister Fabio Asa over the phone with the fiance listening in over the speaker. The sister consented for the surgery and he was taken to IR for a stent assisted angioplasty of proximal Left VA.   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   CT head No acute abnormality. CTA head & neck Intimal irregularity with filling defect involving the proximal left vertebral artery, V1 segment, concerning for acute arterial dissection. Subtle irregular nonocclusive filling defect involving the distal basilar artery/basilar tip, consistent with associated distal embolic disease. Adjacent superior cerebellar arteries are not visualized at their origins, and could be occluded. Associated partially occlusive thrombus with severe stenosis involving the left P1 segment. Additional distal right P2 embolic occlusion  IR S/P revascularization of occuded distal basilar artery and both PCAs and with stent assisted angioplasty for symptomatic prox LT VA dissection  Post IR CT  No acute intracranial pathology.  MRI multifocal infarcts involving bilateral superior cerebellum, left pons, central midbrain, right thalamus, medial temporal and occipital lobes  2D Echo EF 60-65%, normal LA, no IA shunt LDL 116 HgbA1c 5.7 VTE prophylaxis - lovenox No antithrombotic prior to admission, now on aspirin 81 mg daily and Brilinta (ticagrelor) 90 mg bid.   Therapy recommendations:  CIR Disposition:  pending   Involuntary movement EEG Excessive beta activity.  No seizures Repeat EEG: within normal limits. No seizures or epileptiform activity noted.  Per Dr. Pearlean Brownie "appear to be stimulus sensitive myoclonus versus tremors"  Could be related to basilar artery occlusion, double seizures No AED needed at this time  Hyperlipidemia Home meds:  none LDL 116, goal < 70 lipitor 40mg     Continue statin at discharge   Tobacco abuse Current smoker Smoking cessation counseling provided Pt is willing to quit  Dysphagia  On regular diet However, po intake not adequate as expected Still has cortrak placed Encourage po intake  Other Stroke Risk Factors  Substance abuse - UDS:  THC POSITIVE. Patient advised to stop using due to stroke risk.  Obesity, Body mass index is  42.38 kg/m., BMI >/= 30 associated with increased stroke risk, recommend weight loss, diet and exercise as appropriate  Alcohol abuse - limiting alcohol use education provided - on B1/FA/MVI   Other Active Problems Respiratory failure, resolved  Depression on zoloft, seroquel Leukocytosis WBC 11.6-10.3-9.5-10.8  Hospital day # 5  05-28-2000, NP  To contact Stroke Continuity provider, please refer to [place]. After hours, contact General Neurology

## 2021-08-01 DIAGNOSIS — F1010 Alcohol abuse, uncomplicated: Secondary | ICD-10-CM

## 2021-08-01 DIAGNOSIS — F1210 Cannabis abuse, uncomplicated: Secondary | ICD-10-CM

## 2021-08-01 LAB — GLUCOSE, CAPILLARY
Glucose-Capillary: 94 mg/dL (ref 70–99)
Glucose-Capillary: 93 mg/dL (ref 70–99)
Glucose-Capillary: 103 mg/dL — ABNORMAL HIGH (ref 70–99)
Glucose-Capillary: 119 mg/dL — ABNORMAL HIGH (ref 70–99)
Glucose-Capillary: 117 mg/dL — ABNORMAL HIGH (ref 70–99)

## 2021-08-01 LAB — BASIC METABOLIC PANEL
Anion gap: 12 (ref 5–15)
BUN: 14 mg/dL (ref 6–20)
CO2: 22 mmol/L (ref 22–32)
Calcium: 9.7 mg/dL (ref 8.9–10.3)
Chloride: 100 mmol/L (ref 98–111)
Creatinine, Ser: 0.97 mg/dL (ref 0.61–1.24)
GFR, Estimated: >60 mL/min (ref >60)
Glucose, Bld: 91 mg/dL (ref 70–99)
Potassium: 4.1 mmol/L (ref 3.5–5.1)
Sodium: 134 mmol/L — ABNORMAL LOW (ref 135–145)

## 2021-08-01 LAB — CBC
HCT: 38.6 % — ABNORMAL LOW (ref 39.0–52.0)
Hemoglobin: 13.4 g/dL (ref 13.0–17.0)
MCH: 30.6 pg (ref 26.0–34.0)
MCHC: 34.7 g/dL (ref 30.0–36.0)
MCV: 88.1 fL (ref 80.0–100.0)
Platelets: 455 10*3/uL — ABNORMAL HIGH (ref 150–400)
RBC: 4.38 MIL/uL (ref 4.22–5.81)
RDW: 12.9 % (ref 11.5–15.5)
WBC: 10.8 10*3/uL — ABNORMAL HIGH (ref 4.0–10.5)
nRBC: 0 % (ref 0.0–0.2)

## 2021-08-01 LAB — MAGNESIUM: Magnesium: 2.1 mg/dL (ref 1.7–2.4)

## 2021-08-01 MED ORDER — MELATONIN 3 MG PO TABS
3.0000 mg | ORAL_TABLET | Freq: Every day | ORAL | Status: DC
  Administered 2021-08-01 – 2021-08-04 (×4): 3 mg via ORAL
  Filled 2021-08-01 (×4): qty 1

## 2021-08-01 MED ORDER — DIPHENHYDRAMINE HCL 50 MG/ML IJ SOLN: 12.5000 mg | Freq: Once | INTRAMUSCULAR | Status: DC | PRN

## 2021-08-01 NOTE — Plan of Care (Signed)
  Problem: Safety: Goal: Non-violent Restraint(s) Outcome: Progressing   Problem: Education: Goal: Knowledge of disease or condition will improve Outcome: Progressing Goal: Knowledge of secondary prevention will improve (SELECT ALL) Outcome: Progressing   Problem: Coping: Goal: Will verbalize positive feelings about self Outcome: Progressing   Problem: Health Behavior/Discharge Planning: Goal: Ability to manage health-related needs will improve Outcome: Progressing   Problem: Nutrition: Goal: Risk of aspiration will decrease Outcome: Progressing   Problem: Ischemic Stroke/TIA Tissue Perfusion: Goal: Complications of ischemic stroke/TIA will be minimized Outcome: Progressing   Problem: Education: Goal: Knowledge of General Education information will improve Description: Including pain rating scale, medication(s)/side effects and non-pharmacologic comfort measures Outcome: Progressing   Problem: Health Behavior/Discharge Planning: Goal: Ability to manage health-related needs will improve Outcome: Progressing   Problem: Clinical Measurements: Goal: Ability to maintain clinical measurements within normal limits will improve Outcome: Progressing Goal: Will remain free from infection Outcome: Progressing Goal: Diagnostic test results will improve Outcome: Progressing Goal: Respiratory complications will improve Outcome: Progressing Goal: Cardiovascular complication will be avoided Outcome: Progressing   Problem: Activity: Goal: Risk for activity intolerance will decrease Outcome: Progressing   Problem: Nutrition: Goal: Adequate nutrition will be maintained Outcome: Progressing   Problem: Coping: Goal: Level of anxiety will decrease Outcome: Progressing   Problem: Elimination: Goal: Will not experience complications related to bowel motility Outcome: Progressing Goal: Will not experience complications related to urinary retention Outcome: Progressing    Problem: Pain Managment: Goal: General experience of comfort will improve Outcome: Progressing   Problem: Safety: Goal: Ability to remain free from injury will improve Outcome: Progressing   Problem: Skin Integrity: Goal: Risk for impaired skin integrity will decrease Outcome: Progressing

## 2021-08-01 NOTE — Progress Notes (Signed)
Physical Therapy Treatment Patient Details Name: Roy [name] MRN: [id number] DOB: 09-06-1996 Today's Date: 08/01/2021   History of Present Illness Pt is 25 yo male who presents seizures while smoking marijuana and drinking EtOH with girlfriend at home.  with occluded distal basal artery and both PCA's, underwent revascularization and angioplasty by IR on 07/27/21. No significant PMH    PT Comments    Excellent effort by patient while working with physical therapy.  Worked diligently on bed mobility training, seated balance, weight shifting, scooting, and sit<>stand transfers with Stedy. Tolerated prolonged activities for strength, balance, and coordination while standing on Stedy today. Highly motivated and eager to work with rehab. Family present and very supportive. Patient will continue to benefit from skilled physical therapy services to further improve independence with functional mobility.    Recommendations for follow up therapy are one component of a multi-disciplinary discharge planning process, led by the attending physician.  Recommendations may be updated based on patient status, additional functional criteria and insurance authorization.  Follow Up Recommendations  Acute inpatient rehab (3hours/day)     Assistance Recommended at Discharge Frequent or constant Supervision/Assistance  Equipment Recommendations  Other (comment) (TBD)    Recommendations for Other Services Rehab consult     Precautions / Restrictions Precautions Precautions: Fall Precaution Comments: permissive HTN <220/120, R hemi Restrictions Weight Bearing Restrictions: No     Mobility  Bed Mobility Overal bed mobility: Needs Assistance Bed Mobility: Rolling;Sidelying to Sit;Sit to Sidelying Rolling: Mod assist Sidelying to sit: Mod assist     Sit to sidelying: Mod assist General bed mobility comments: Hand over hand assist to reach rail, difficulty sustaining grip with Lt hand when rolling  towards Rt side. Mod assist for trunk, pt providing much effort to rise (tearful upon sitting.) Pt able to lower self onto Rt side with some difficulty with control, Mod assist for bil LEs into bed. Educated on technqiues. Cues for sequencing with roll technique and practiced bridging at end of session able to successfully slide to [facility] when therapist provided blockage to press feet into bed and pull with RUE on rail.    Transfers Overall transfer level: Needs assistance Equipment used: Ambulation equipment used Antony Salmon) Transfers: Sit to/from Stand Sit to Stand: Mod assist           General transfer comment: Mod assist for boost to stand from slightly elevated bed surface, showing great effort, assisted with keeping hands on horizontal stedy bar and cues to extend hips fully, able to lower flaps for patient and sit in upright position on stedy. Spent approx 15 minutes on this device working on postural control, rising to full stand position, weight shifting LT and right. Able to progress with sliding Lt and Rt foot back onto foot plates for good alignment. Worked on extending knees and back in upright position. Stood upright fully multiple times ranging in length from 15 to 40 seconds. Some Rt knee instability but showing some progressive control while attempting to march feet on foot plate without using knee pad.    Ambulation/Gait                   Stairs             Wheelchair Mobility    Modified Rankin (Stroke Patients Only) Modified Rankin (Stroke Patients Only) Pre-Morbid Rankin Score: No symptoms Modified Rankin: Severe disability     Balance Overall balance assessment: Needs assistance Sitting-balance support: Feet supported Sitting balance-Leahy Scale: Fair Sitting balance -  Comments: Initially required constant min assist to sit EOB. Progressed to unsupported sitting with CGA for safety.   Standing balance support: Bilateral upper extremity  supported;Reliant on assistive device for balance Standing balance-Leahy Scale: Poor Standing balance comment: holding himself up majority of time spent on stedy with early corrective techniques for midline.                            Cognition Arousal/Alertness: Awake/alert Behavior During Therapy: WFL for tasks assessed/performed Overall Cognitive Status: Impaired/Different from baseline Area of Impairment: Attention;Safety/judgement;Following commands;Awareness;Problem solving                   Current Attention Level: Sustained   Following Commands: Follows one step commands consistently Safety/Judgement: Decreased awareness of safety;Decreased awareness of deficits Awareness: Intellectual Problem Solving: Slow processing;Difficulty sequencing;Requires tactile cues;Requires verbal cues General Comments: Showing better awareness of deficits; states LUE is "wild"        Exercises Other Exercises Other Exercises: Seated weight shifting and weight bearing through UEs with pressing himself back up into midline 2x5 ea with min-mod assist at times Other Exercises: Seated LAQ 2x5 Other Exercises: Functional reaching task while seated on stedy aiming with Lt and Rt hand ipsilaterally.    General Comments General comments (skin integrity, edema, etc.): HR up to 140s while standing.      Pertinent Vitals/Pain Pain Assessment: Faces Faces Pain Scale: Hurts a little bit Pain Location: lower back Pain Descriptors / Indicators: Discomfort;Tightness Pain Intervention(s): Monitored during session;Repositioned    Home Living                          Prior Function            PT Goals (current goals can now be found in the care plan section) Acute Rehab PT Goals Patient Stated Goal: to get stronger PT Goal Formulation: With patient Time For Goal Achievement: 08/11/21 Potential to Achieve Goals: Good Progress towards PT goals: Progressing toward  goals    Frequency    Min 4X/week      PT Plan Current plan remains appropriate    Co-evaluation              AM-PAC PT "6 Clicks" Mobility   Outcome Measure  Help needed turning from your back to your side while in a flat bed without using bedrails?: A Lot Help needed moving from lying on your back to sitting on the side of a flat bed without using bedrails?: A Lot Help needed moving to and from a bed to a chair (including a wheelchair)?: Total Help needed standing up from a chair using your arms (e.g., wheelchair or bedside chair)?: Total Help needed to walk in hospital room?: Total Help needed climbing 3-5 steps with a railing? : Total 6 Click Score: 8    End of Session Equipment Utilized During Treatment: Gait belt Activity Tolerance: Patient tolerated treatment well Patient left: with call bell/phone within reach;in chair;with family/visitor present Nurse Communication: Mobility status;Need for lift equipment PT Visit Diagnosis: Unsteadiness on feet (R26.81);Muscle weakness (generalized) (M62.81);Difficulty in walking, not elsewhere classified (R26.2);Other abnormalities of gait and mobility (R26.89);Ataxic gait (R26.0);Other symptoms and signs involving the nervous system (B51.025)     Time: 8527-7824 PT Time Calculation (min) (ACUTE ONLY): 34 min  Charges:  $Therapeutic Activity: 8-22 mins $Neuromuscular Re-education: 8-22 mins  Kathlyn Sacramento, PT, DPT   Berton Mount 08/01/2021, 2:12 PM

## 2021-08-01 NOTE — Progress Notes (Addendum)
INTERVAL HISTORY His girlfriend and friend are at the bedside. Family is assisting him into the chair. He is laughing and smiling today. Requesting to have cortrak tube removed. Have placed order to d/c NG tube. No new neurological events. Asking when he is going to rehab, explained we need to wait on bed availability/approval and they all seemed to understand   Vitals:   08/01/21 0054 08/01/21 0419 08/01/21 0500 08/01/21 0850  BP: 121/80 131/82  124/81  Pulse: 74 74  92  Resp: 18 18  18   Temp: 98.5 F (36.9 C) 98.7 F (37.1 C)  98.8 F (37.1 C)  TempSrc: Oral Oral  Oral  SpO2: 100% 100%  98%  Weight:   98.3 kg   Height:       CBC:  Recent Labs  Lab 07/28/21 0519 07/29/21 0647 07/30/21 0306 07/31/21 0324 08/01/21 0231  WBC 11.6* 10.3   < > 10.8* 10.8*  NEUTROABS 8.4* 6.8  --   --   --   HGB 11.5* 11.4*   < > 13.0 13.4  HCT 34.1* 34.0*   < > 37.2* 38.6*  MCV 91.4 90.2   < > 88.4 88.1  PLT 313 334   < > 405* 455*   < > = values in this interval not displayed.    Basic Metabolic Panel:  Recent Labs  Lab 07/29/21 1631 07/30/21 0306 07/31/21 0324 08/01/21 0231  NA  --  134* 133* 134*  K  --  4.0 3.8 4.1  CL  --  102 99 100  CO2  --  22 23 22   GLUCOSE  --  95 91 91  BUN  --  12 14 14   CREATININE  --  0.84 0.96 0.97  CALCIUM  --  8.9 9.3 9.7  MG 2.2 2.0 2.0 2.1  PHOS 3.2 4.1  --   --     Lipid Panel:  Recent Labs  Lab 07/28/21 0519  CHOL 199  TRIG 230*  HDL 37*  CHOLHDL 5.4  VLDL 46*  LDLCALC 116*    HgbA1c:  Recent Labs  Lab 07/27/21 0500  HGBA1C 5.7*    Urine Drug Screen:  Recent Labs  Lab 07/27/21 0606  LABOPIA NONE DETECTED  COCAINSCRNUR NONE DETECTED  LABBENZ NONE DETECTED  AMPHETMU NONE DETECTED  THCU POSITIVE*  LABBARB NONE DETECTED     Alcohol Level  Recent Labs  Lab 07/27/21 0031  ETH <10     IMAGING past 24 hours No results found.  PHYSICAL EXAM [name] is not in distress. Afebrile.  Head is nontraumatic. Neck is supple. Cardiac exam no murmur or gallop. Lungs are clear to auscultation. Distal pulses are well felt. . Neurological Exam :    Mental status/Cognition: Awake alert.  Oriented to time place and person.  Follows commands well. Speech/language: Moderate dysarthria but can be understood.  Voice is slightly hypophonic. Cranial nerves/brainstem: Pupils 30mm, equal round and reactive to light.  Right gaze palsy with subjective diplopia.  Disconjugate horizontal gaze.  Preserved vertical gaze. Nystagmus on left gaze  Moderate right lower facial weakness.  Tongue midline.   Motor:  Right hemiparesis with right upper extremity 3/5 strength with distal hand weakness.  Right lower extremity 2/5 strength.  Left upper extremity 4/5 strength with good grip.  Left lower extremity 4/5 strength.  Tone is normal. Sensation:  Diminished touch pinprick sensation in the right face and upper and lower extremities.   Coordination/Complex Motor:  Ataxia in  left FTN  ASSESSMENT/PLAN  Roy [name] is a 25 y.o. male with  no significant PMH who presents with shaking and unresponsiveness. Patient is intubated and unable to provide any history. Most of the history obtained from sister and fiance over phone. Per fiance, he has been reporting that his head hurts for the last 3-4 days along with feeling cold. Today, they smoked marijuana and he took 4 shots of Vodka. They were having intercourse when he fell out and the left side of his body went numb. He was sweating profusely and kept saying "my brain" and then started having seizures. This happened about 30 mins to an hours after taking marijuana. No prior hx of seizures. He has been reporting headache for the last 3-4 days. Today started throwing up. He threw up a lot today. Spends a lot of time in a shed but comes to sister's home every night to sleep. The shed does not have proper ventilation, it is full of stuff but nothing toxic that  sister or fiance is aware of. No obvious exposure to Carbon Monoxide. No fever, no chills but has been feeling cold.   The fiance immediately called EMS for concern for seizure like activity. Per notes, the seizure like activity stopped enroute by the time EMS got an IV so he was not given any Versed. It resumed in the ED and he was given ativan 4mg , Keppra 2G in the ED with persistent shaking and was intubated with rocuronium and etomidate and started on propofol.   On my evaluation, he has tongue movements and twitching along with jerks mostly in the LUE and RLE. Jerking appears arhythmic on my evaluation and ?myoclonic.   No recent water fasts, he is not a regular drinker, has used marijuana in the past without any issues. Fiance and another friend also smoked the same marijuana without any issues. He was fine all day otherwise. No recent head injury with loss of consciousness, no family history of seizures.   CT head was negative for a large hypodensity concerning for a large territory infarct or hyperdensity concerning for an ICH.   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 In addition, imaging notable for a distal basilar thrombus, R PCA P2 occlusion and Left PCA P1 occlusion. A code stroke was initiated and case discussed with Dr. for evaluation for potential thrombectomy. Dr. Corliss Skains discussed risks and benefits and the need for thrombectomy with patient's sister Corliss Skains over the phone with the fiance listening in over the speaker. The sister consented for the surgery and he was taken to IR for a stent assisted angioplasty of proximal Left VA.   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  CT head No acute abnormality. CTA head & neck Intimal irregularity with filling defect involving the proximal left  vertebral artery, V1 segment, concerning for acute arterial dissection. Subtle irregular nonocclusive filling defect involving the distal basilar artery/basilar tip, consistent with associated distal embolic disease. Adjacent superior cerebellar arteries are not visualized at their origins, and could be occluded. Associated partially occlusive thrombus with severe stenosis involving the left P1 segment. Additional distal right P2 embolic occlusion  IR S/P revascularization of occuded distal basilar artery and both PCAs and with stent assisted angioplasty for symptomatic prox LT VA dissection  Post IR CT  No acute intracranial pathology.  MRI multifocal infarcts involving bilateral superior cerebellum, left pons, central midbrain, right thalamus, medial temporal and occipital lobes  2D Echo EF 60-65%, normal LA, no IA shunt LDL 116 HgbA1c 5.7 VTE prophylaxis - lovenox No antithrombotic prior to admission, now on aspirin 81 mg daily and Brilinta (ticagrelor) 90 mg bid.   Therapy recommendations:  CIR Disposition:  pending   Involuntary movement EEG Excessive beta activity.  No seizures Repeat EEG: within normal limits. No seizures or epileptiform activity noted.  Per Dr. Pearlean Brownie "appear to be stimulus sensitive myoclonus versus tremors"  Could be related to basilar artery occlusion, double seizures No AED needed at this time  Hyperlipidemia Home meds:  none LDL 116, goal < 70 lipitor 40mg     Continue statin at discharge   Tobacco abuse Current smoker Smoking cessation counseling provided Pt is willing to quit  Dysphagia  On regular diet However, po intake not adequate as expected Still has cortrak placed Encourage po intake  Other Stroke Risk Factors  Substance abuse - UDS:  THC POSITIVE. Patient advised to stop using due to stroke risk.  Obesity, Body mass index is 42.38 kg/m., BMI >/= 30 associated with increased stroke risk, recommend weight loss, diet and exercise as  appropriate  Alcohol abuse - limiting alcohol use education provided - on B1/FA/MVI   Other Active Problems Respiratory failure, resolved  Depression on zoloft, seroquel Leukocytosis WBC 11.6-10.3-9.5-10.8-10.8  Hospital day # 6  05-28-2000, NP  ATTENDING NOTE: I reviewed above note and agree with the assessment and plan. Pt was seen and examined.   Girlfriend at bedside.  No acute event overnight, neuro unchanged.  Yesterday dinner and this morning recommend eating well.  Core track removed.  Pending CIR.  For detailed assessment and plan, please refer to above as I have made changes wherever appropriate.   Gevena Mart, MD PhD Stroke Neurology 08/01/2021 6:10 PM    To contact Stroke Continuity provider, please refer to 08/03/2021. After hours, contact General Neurology

## 2021-08-02 DIAGNOSIS — D72829 Elevated white blood cell count, unspecified: Secondary | ICD-10-CM

## 2021-08-02 LAB — GLUCOSE, CAPILLARY
Glucose-Capillary: 114 mg/dL — ABNORMAL HIGH (ref 70–99)
Glucose-Capillary: 98 mg/dL (ref 70–99)
Glucose-Capillary: 128 mg/dL — ABNORMAL HIGH (ref 70–99)
Glucose-Capillary: 83 mg/dL (ref 70–99)
Glucose-Capillary: 118 mg/dL — ABNORMAL HIGH (ref 70–99)
Glucose-Capillary: 129 mg/dL — ABNORMAL HIGH (ref 70–99)

## 2021-08-02 LAB — CBC
HCT: 40 % (ref 39.0–52.0)
Hemoglobin: 13.6 g/dL (ref 13.0–17.0)
MCH: 30.3 pg (ref 26.0–34.0)
MCHC: 34 g/dL (ref 30.0–36.0)
MCV: 89.1 fL (ref 80.0–100.0)
Platelets: 495 10*3/uL — ABNORMAL HIGH (ref 150–400)
RBC: 4.49 MIL/uL (ref 4.22–5.81)
RDW: 13 % (ref 11.5–15.5)
WBC: 11 10*3/uL — ABNORMAL HIGH (ref 4.0–10.5)
nRBC: 0 % (ref 0.0–0.2)

## 2021-08-02 LAB — BASIC METABOLIC PANEL
Anion gap: 10 (ref 5–15)
BUN: 18 mg/dL (ref 6–20)
CO2: 22 mmol/L (ref 22–32)
Calcium: 9.5 mg/dL (ref 8.9–10.3)
Chloride: 101 mmol/L (ref 98–111)
Creatinine, Ser: 1.05 mg/dL (ref 0.61–1.24)
GFR, Estimated: >60 mL/min (ref >60)
Glucose, Bld: 112 mg/dL — ABNORMAL HIGH (ref 70–99)
Potassium: 4 mmol/L (ref 3.5–5.1)
Sodium: 133 mmol/L — ABNORMAL LOW (ref 135–145)

## 2021-08-02 LAB — MAGNESIUM: Magnesium: 2.2 mg/dL (ref 1.7–2.4)

## 2021-08-02 NOTE — Progress Notes (Addendum)
Inpatient Rehab Admissions Coordinator:   I do not have a CIR bed for this Pt. I will continue to follow for potential admit pending  bed availability and confirmation of dispo.. I will also call family as they were to discuss whether or not they can support Pt. After d/c   Megan Salon, MS, CCC-SLP Rehab Admissions Coordinator  [phone] (celll) [phone] (office)

## 2021-08-02 NOTE — Progress Notes (Signed)
Occupational Therapy Treatment Patient Details Name: Roy [name] MRN: [id number] DOB: 08-31-1996 Today's Date: 08/02/2021   History of present illness Pt is 25 yo male who presents seizures while smoking marijuana and drinking EtOH with girlfriend at home.  with occluded distal basal artery and both PCA's, underwent revascularization and angioplasty by IR on 07/27/21. No significant PMH   OT comments  Roy [name] is progressing well with improved awareness and attention to his LUE. Pt completed repetitive unilateral and bimanual tasks with increased time at bed level. Pt also used built up foam on spoon to increase independence with self feeding. Pt groomed and bathed with max A overall with use of hand over hand to control LUE to assist in functional task. Pt in great spirits today, joking and singing throughout session. D/c recommendation remains appropriate.    Recommendations for follow up therapy are one component of a multi-disciplinary discharge planning process, led by the attending physician.  Recommendations may be updated based on patient status, additional functional criteria and insurance authorization.    Follow Up Recommendations  Acute inpatient rehab (3hours/day)    Assistance Recommended at Discharge Intermittent Supervision/Assistance  Equipment Recommendations  BSC/3in1;Wheelchair (measurements OT);Wheelchair cushion (measurements OT)    Recommendations for Other Services Rehab consult    Precautions / Restrictions Precautions Precautions: Fall Restrictions Weight Bearing Restrictions: No       Mobility Bed Mobility Overal bed mobility: Needs Assistance Bed Mobility: Rolling Rolling: Mod assist         General bed mobility comments: mod A to roll to assist in hygiene and ADLs, verbal cues and increased time to grasp bed rail and release    Transfers                   General transfer comment: bed level tasks this session     Balance Overall  balance assessment: Needs assistance           ADL either performed or assessed with clinical judgement   ADL Overall ADL's : Needs assistance/impaired Eating/Feeding: Moderate assistance;Sitting Eating/Feeding Details (indicate cue type and reason): practiced with built up spoon this session wtih RUE Grooming: Wash/dry face;Maximal assistance;Standing Grooming Details (indicate cue type and reason): pt required assist to grasp rag and bring to face, pt able to wipe in downwad motion. Need further assist for hand over hand to be thorough Upper Body Bathing: Maximal assistance;Bed level Upper Body Bathing Details (indicate cue type and reason): Had pt use LUE to bathe chest with hand over hand and while using RUE to dependently control LUE                 Toileting- Clothing Manipulation and Hygiene: Total assistance;Bed level Toileting - Clothing Manipulation Details (indicate cue type and reason): complete peri hygiene at bed level in sidelying position, pt able to sustain sidelying while holding bed rail     Functional mobility during ADLs: Moderate assistance (bed level only this session) General ADL Comments: pt with improved awareness and attention to LUE; ataxic movements throughout. Pt in great spirits, singing and joking thourghout session    Extremity/Trunk Assessment Upper Extremity Assessment RUE Deficits / Details: decreased awareness of position in space, L worse than R, grasp 3+/5, low attention and fatigues quickly RUE Sensation: decreased proprioception RUE Coordination: decreased fine motor;decreased gross motor LUE Deficits / Details: decreased awareness of position in space, L worse than R, grasp 3+/5, low attention and fatigues quickly LUE Sensation: decreased proprioception LUE Coordination: decreased fine motor;decreased  gross motor   Lower Extremity Assessment Lower Extremity Assessment: Defer to PT evaluation              Praxis Praxis Praxis:  Impaired Praxis Impairment Details: Initiation;Ideomotor    Cognition Arousal/Alertness: Awake/alert Behavior During Therapy: WFL for tasks assessed/performed Overall Cognitive Status: Impaired/Different from baseline Area of Impairment: Attention;Memory;Following commands;Awareness;Problem solving                   Current Attention Level: Sustained   Following Commands: Follows one step commands consistently Safety/Judgement: Decreased awareness of safety;Decreased awareness of deficits Awareness: Intellectual Problem Solving: Slow processing;Difficulty sequencing;Requires tactile cues;Requires verbal cues General Comments: Pt follows one step commands consistently, requires verbal cues to follow 2+ steps. Has fair insight into his deficits telling his LUE to "shhh" throughout session, and talking to himself to perform tasks asked of him                General Comments VSS on RA, urine in wall conister has a pink/red hue to it. RN notified.    Pertinent Vitals/ Pain       Pain Assessment: No/denies pain Pain Intervention(s): Monitored during session   Frequency  Min 2X/week        Progress Toward Goals  OT Goals(current goals can now be found in the care plan section)  Progress towards OT goals: Progressing toward goals  Acute Rehab OT Goals Patient Stated Goal: walk again OT Goal Formulation: With patient/family Time For Goal Achievement: 08/11/21 Potential to Achieve Goals: Good ADL Goals Pt Will Perform Grooming: with mod assist;sitting Pt Will Perform Upper Body Bathing: with mod assist;sitting Pt Will Transfer to Toilet: with +2 assist;with mod assist;stand pivot transfer;bedside commode Additional ADL Goal #1: pt will follow 2 step command 50% of session  Plan Discharge plan remains appropriate;Frequency remains appropriate       AM-PAC OT "6 Clicks" Daily Activity     Outcome Measure   Help from another person eating meals?: A Lot Help from  another person taking care of personal grooming?: A Lot Help from another person toileting, which includes using toliet, bedpan, or urinal?: A Lot Help from another person bathing (including washing, rinsing, drying)?: A Lot Help from another person to put on and taking off regular upper body clothing?: A Lot Help from another person to put on and taking off regular lower body clothing?: A Lot 6 Click Score: 12    End of Session Equipment Utilized During Treatment:  (built up foam for utencils)  OT Visit Diagnosis: Unsteadiness on feet (R26.81);Muscle weakness (generalized) (M62.81);Cognitive communication deficit (R41.841) Symptoms and signs involving cognitive functions: Cerebral infarction   Activity Tolerance Patient tolerated treatment well   Patient Left with call bell/phone within reach;with bed alarm set;with family/visitor present   Nurse Communication Mobility status;Precautions (urine color)        Time: 1451-1520 OT Time Calculation (min): 29 min  Charges: OT General Charges $OT Visit: 1 Visit OT Treatments $Self Care/Home Management : 23-37 mins    Itzelle Gains A Kristiana Jacko 08/02/2021, 3:52 PM

## 2021-08-02 NOTE — Plan of Care (Signed)
  Problem: Safety: Goal: Non-violent Restraint(s) Outcome: Progressing   Problem: Education: Goal: Knowledge of disease or condition will improve Outcome: Progressing Goal: Knowledge of secondary prevention will improve (SELECT ALL) Outcome: Progressing   Problem: Coping: Goal: Will verbalize positive feelings about self Outcome: Progressing   Problem: Health Behavior/Discharge Planning: Goal: Ability to manage health-related needs will improve Outcome: Progressing   Problem: Nutrition: Goal: Risk of aspiration will decrease Outcome: Progressing   Problem: Ischemic Stroke/TIA Tissue Perfusion: Goal: Complications of ischemic stroke/TIA will be minimized Outcome: Progressing   Problem: Education: Goal: Knowledge of General Education information will improve Description: Including pain rating scale, medication(s)/side effects and non-pharmacologic comfort measures Outcome: Progressing   Problem: Health Behavior/Discharge Planning: Goal: Ability to manage health-related needs will improve Outcome: Progressing   Problem: Clinical Measurements: Goal: Ability to maintain clinical measurements within normal limits will improve Outcome: Progressing Goal: Will remain free from infection Outcome: Progressing Goal: Diagnostic test results will improve Outcome: Progressing Goal: Respiratory complications will improve Outcome: Progressing Goal: Cardiovascular complication will be avoided Outcome: Progressing   Problem: Activity: Goal: Risk for activity intolerance will decrease Outcome: Progressing   Problem: Nutrition: Goal: Adequate nutrition will be maintained Outcome: Progressing   Problem: Coping: Goal: Level of anxiety will decrease Outcome: Progressing   Problem: Elimination: Goal: Will not experience complications related to urinary retention Outcome: Progressing   Problem: Pain Managment: Goal: General experience of comfort will improve Outcome:  Progressing   Problem: Safety: Goal: Ability to remain free from injury will improve Outcome: Progressing   Problem: Skin Integrity: Goal: Risk for impaired skin integrity will decrease Outcome: Progressing

## 2021-08-02 NOTE — Progress Notes (Addendum)
INTERVAL HISTORY His girlfriend and friend are at the bedside. No new neurological events overnight. Patient is very interactive and in good spirits, laughing and joking around. Tells me he has been eating and drinking well. Asks when he will get to go to rehab, tells me he is anxious to get working with therapy and that he's bored. Discussed some exercises that he could be doing in the meantime. Informed them we are in the process of arranging CIR and will keep them updated.   Vitals:   08/02/21 0340 08/02/21 0422 08/02/21 0535 08/02/21 0728  BP: 117/88   126/67  Pulse: 80   85  Resp: 19 18 20    Temp: 98.4 F (36.9 C)     TempSrc: Oral     SpO2: 99%   100%  Weight:      Height:       CBC:  Recent Labs  Lab 07/28/21 0519 07/29/21 0647 07/30/21 0306 08/01/21 0231 08/02/21 0753  WBC 11.6* 10.3   < > 10.8* 11.0*  NEUTROABS 8.4* 6.8  --   --   --   HGB 11.5* 11.4*   < > 13.4 13.6  HCT 34.1* 34.0*   < > 38.6* 40.0  MCV 91.4 90.2   < > 88.1 89.1  PLT 313 334   < > 455* 495*   < > = values in this interval not displayed.    Basic Metabolic Panel:  Recent Labs  Lab 07/29/21 1631 07/30/21 0306 07/31/21 0324 08/01/21 0231 08/02/21 0753  NA  --  134*   < > 134* 133*  K  --  4.0   < > 4.1 4.0  CL  --  102   < > 100 101  CO2  --  22   < > 22 22  GLUCOSE  --  95   < > 91 112*  BUN  --  12   < > 14 18  CREATININE  --  0.84   < > 0.97 1.05  CALCIUM  --  8.9   < > 9.7 9.5  MG 2.2 2.0   < > 2.1 2.2  PHOS 3.2 4.1  --   --   --    < > = values in this interval not displayed.    Lipid Panel:  Recent Labs  Lab 07/28/21 0519  CHOL 199  TRIG 230*  HDL 37*  CHOLHDL 5.4  VLDL 46*  LDLCALC 116*    HgbA1c:  Recent Labs  Lab 07/27/21 0500  HGBA1C 5.7*    Urine Drug Screen:  Recent Labs  Lab 07/27/21 0606  LABOPIA NONE DETECTED  COCAINSCRNUR NONE DETECTED  LABBENZ NONE DETECTED  AMPHETMU NONE DETECTED  THCU POSITIVE*  LABBARB NONE DETECTED     Alcohol Level   Recent Labs  Lab 07/27/21 0031  ETH <10     IMAGING past 24 hours No results found.  PHYSICAL EXAM  Temp:  [98.1 F (36.7 C)-98.9 F (37.2 C)] 98.9 F (37.2 C) (11/14 1726) Pulse Rate:  [80-109] 88 (11/14 1726) Resp:  [16-20] 16 (11/14 1726) BP: (117-143)/(67-97) 121/84 (11/14 1726) SpO2:  [98 %-100 %] 98 % (11/14 1726)  General - Well nourished, well developed, in no apparent distress.  Ophthalmologic - fundi not visualized due to noncooperation.  Cardiovascular - Regular rhythm and rate.  Neuro - awake, alert, eyes open, orientated to age, place, year but stated December on month instead of November. No aphasia, however, moderate dysarthria, following all  simple commands. Able to name and repeat in dysarthric voice. Left INO, right gaze right eye nystagmus, b/l upward gaze with up beat nystagmus. Visual field full, PERRL. Mild right facial droop. Tongue protrusion to the right slightly. LUE proximal 2/5, bicep 4/5, tricep 2+/5, finger grip 4-/5. RUE proximal 3/5, bicep 4/5, tricep 3/5, finger grip 4-/5. BLE proximal 3/5 and distal ankle DF 4+/5. Sensation decreased on the left, b/l FTN ataxic proportional to the weakness, gait not tested.    ASSESSMENT/PLAN 25 year old male with history of obesity admitted for unresponsiveness and shaking jerking movements.  This symptoms happened after THC and alcohol abuse, and during sexual activity he fell, had left-sided numbness, diaphoresis, headache, nausea vomiting and seizure-like activity.  He received Versed Ativan and Keppra for presumed seizure activity.  Intubated in the ER, CT negative, CT head and neck showed left V1 dissection with thrombus formation, distal basilar artery thrombosis, right P2 and left P1 occlusion due to thrombus.  Status post IR with TICI2c of BA and PCAs, also left VA stenting.  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  CT head No acute abnormality. CTA head & neck Intimal irregularity with filling defect involving the proximal left vertebral artery, V1 segment, concerning for acute arterial dissection. Subtle irregular nonocclusive filling defect involving the distal basilar artery/basilar tip, consistent with associated distal embolic disease. Adjacent superior cerebellar arteries are not visualized at their origins, and could be occluded. Associated partially occlusive thrombus with severe stenosis involving the left P1 segment. Additional distal right P2 embolic occlusion  IR S/P revascularization of occuded distal basilar artery and both PCAs and with stent assisted angioplasty for symptomatic prox LT VA dissection  Post IR CT  No acute intracranial pathology.  MRI multifocal infarcts involving bilateral superior cerebellum, left pons, central midbrain, right thalamus, medial temporal and occipital lobes  2D Echo EF 60-65%, normal LA, no IA shunt LDL 116 HgbA1c 5.7 VTE prophylaxis - lovenox No antithrombotic prior to admission, now on aspirin 81 mg daily and Brilinta (ticagrelor) 90 mg bid.   Therapy recommendations:  CIR Disposition:  pending   Involuntary movement EEG Excessive beta activity.  No seizures Repeat EEG: within normal limits. No seizures or epileptiform activity noted.  Per Dr. Pearlean Brownie "appear to be stimulus sensitive myoclonus versus tremors"  Could be related to basilar artery occlusion, double seizures No AED needed at this time  Hyperlipidemia Home meds:  none LDL 116, goal < 70 lipitor 40mg     Continue statin at discharge   Tobacco abuse Current smoker Smoking cessation counseling provided Pt is willing to quit  Dysphagia  On regular diet However, po intake not adequate as expected Still has cortrak placed Encourage po  intake  Other Stroke Risk Factors  Substance abuse - UDS:  THC POSITIVE. Patient advised to stop using due to stroke risk.  Obesity, Body mass index is 42.38 kg/m., BMI >/= 30 associated with increased stroke risk, recommend weight loss, diet and exercise as appropriate  Alcohol abuse - limiting alcohol use education provided - on B1/FA/MVI   Other Active Problems Respiratory failure, resolved  Depression on zoloft, seroquel Leukocytosis WBC 11.6-10.3-9.5-10.8-10.8->11.0 Pink color tinged urine - continue monitoring  Hospital day # 7  05-28-2000, NP  ATTENDING NOTE: I reviewed above note and agree  with the assessment and plan. Pt was seen and examined.   Girlfriend at bedside, no acute event overnight, neuro stable, still pending CIR.  On aspirin Brilinta, with reported pink-colored tinged urine, however, no frank hematuria.  We will continue monitoring.  PT/OT recommend CIR.  For detailed assessment and plan, please refer to above as I have made changes wherever appropriate.   Rosalin Hawking, MD PhD Stroke Neurology 08/02/2021 7:10 PM     To contact Stroke Continuity provider, please refer to [email or web address] After hours, contact General Neurology

## 2021-08-02 NOTE — Progress Notes (Signed)
Physical Therapy Treatment Patient Details Name: Roy [name] MRN: [id number] DOB: 16-Sep-1996 Today's Date: 08/02/2021   History of Present Illness Pt is 24 yo male who presents seizures while smoking marijuana and drinking EtOH with fiance at home. 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 No significant PMH.    PT Comments    Pt received in supine, agreeable to therapy session and with good participation and tolerance for seated/standing exercises and transfer training. Pt needing up to modA +2 for dynamic standing tasks at bedside but unable to progress gait due to BUE/LE ataxia and quick to fatigue. Pt very motivated and pleasant to participate today, fair carryover of reminders for techniques to improve balance within session. Pt c/o dizziness with standing able to stand ~60-90 seconds at a time within locked EVA walker and +2 physical assist.   Recommendations for follow up therapy are one component of a multi-disciplinary discharge planning process, led by the attending physician.  Recommendations may be updated based on patient status, additional functional criteria and insurance authorization.  Follow Up Recommendations  Acute inpatient rehab (3hours/day)     Assistance Recommended at Discharge Frequent or constant Supervision/Assistance  Equipment Recommendations  Other (comment) (TBD post-acute)    Recommendations for Other Services       Precautions / Restrictions Precautions Precautions: Fall Precaution Comments: permissive HTN <220/120, R hemi Restrictions Weight Bearing Restrictions: No     Mobility  Bed Mobility Overal bed mobility: Needs Assistance Bed Mobility: Rolling Rolling: Mod assist Sidelying to sit: Mod assist;HOB elevated     Sit to sidelying: Mod assist;+2 for safety/equipment General bed mobility comments: mod A to roll to assist,  verbal cues and increased time to grasp bed rail and release, HOB partially elevated to assist with supine>sit transition.    Transfers Overall transfer level: Needs assistance Equipment used:  (EVA walker) Transfers: Sit to/from Stand Sit to Stand: Mod assist;+2 physical assistance           General transfer comment: from elevated bed height>EVA walker x3 trials    Ambulation/Gait     Pre-gait activities: marching x 5 reps prior to seated break, sidesteps along EOB x 3-4 x2 trials, +2 mod to maxA for sidesteps and pt diaphoretic, mildly dizzy               Modified Rankin (Stroke Patients Only) Modified Rankin (Stroke Patients Only) Pre-Morbid Rankin Score: No symptoms Modified Rankin: Severe disability     Balance Overall balance assessment: Needs assistance Sitting-balance support: Feet supported Sitting balance-Leahy Scale: Fair Sitting balance - Comments: Initially required constant min assist to sit EOB. Progressed to unsupported sitting with CGA for safety.   Standing balance support: Bilateral upper extremity supported;Reliant on assistive device for balance Standing balance-Leahy Scale: Poor Standing balance comment: up to +2 physical assist at EVA walker with dynamic standing tasks            Cognition Arousal/Alertness: Awake/alert Behavior During Therapy: WFL for tasks assessed/performed Overall Cognitive Status: Impaired/Different from baseline Area of Impairment: Attention;Memory;Following commands;Awareness;Problem solving       Current Attention Level: Sustained   Following Commands: Follows one step commands consistently Safety/Judgement: Decreased awareness of safety;Decreased awareness of deficits Awareness: Intellectual Problem Solving: Slow processing;Difficulty sequencing;Requires tactile cues;Requires verbal cues General Comments: Pt follows one step commands consistently, requires verbal cues to follow 2+ steps. Has fair insight  into his deficits telling his LUE  to "shhh" throughout session, and talking to himself to perform tasks asked of him        Exercises Other Exercises Other Exercises: Seated weight shifting and weight bearing through UEs with pressing himself back up into midline 2x5 ea with min-mod assist at times Other Exercises: standing hip flexion x5 reps ea Other Exercises: supine BUE AROM: wrist/elbow flex/ext x5 reps ea Other Exercises: Static standing for BLE strengthening >30 seconds x 3 trials    General Comments General comments (skin integrity, edema, etc.): SBP 138 seated EOB after standing, pt diaphoretic and c/o dizziness but reports he "runs hot" and sweats frequently per fiance. BP goal <220/120 per neuro notes.      Pertinent Vitals/Pain Pain Assessment: No/denies pain Pain Intervention(s): Monitored during session;Repositioned     PT Goals (current goals can now be found in the care plan section) Acute Rehab PT Goals Patient Stated Goal: to get stronger PT Goal Formulation: With patient Time For Goal Achievement: 08/11/21 Progress towards PT goals: Progressing toward goals    Frequency    Min 4X/week      PT Plan Current plan remains appropriate       AM-PAC PT "6 Clicks" Mobility   Outcome Measure  Help needed turning from your back to your side while in a flat bed without using bedrails?: A Lot Help needed moving from lying on your back to sitting on the side of a flat bed without using bedrails?: A Lot Help needed moving to and from a bed to a chair (including a wheelchair)?: A Lot Help needed standing up from a chair using your arms (e.g., wheelchair or bedside chair)?: A Lot Help needed to walk in hospital room?: Total Help needed climbing 3-5 steps with a railing? : Total 6 Click Score: 10    End of Session Equipment Utilized During Treatment: Gait belt Activity Tolerance: Patient tolerated treatment well Patient left: with call bell/phone within  reach;with family/visitor present;in bed;with bed alarm set (fiance present) Nurse Communication: Mobility status PT Visit Diagnosis: Unsteadiness on feet (R26.81);Muscle weakness (generalized) (M62.81);Difficulty in walking, not elsewhere classified (R26.2);Other abnormalities of gait and mobility (R26.89);Ataxic gait (R26.0);Other symptoms and signs involving the nervous system (R29.898)     Time: 7048-8891 PT Time Calculation (min) (ACUTE ONLY): 33 min  Charges:  $Therapeutic Exercise: 8-22 mins $Therapeutic Activity: 8-22 mins                     Hayze Gazda P., PTA Acute Rehabilitation Services Pager: [phone] Office: [phone]    Angus Palms 08/02/2021, 5:45 PM

## 2021-08-02 NOTE — Progress Notes (Signed)
Inpatient Rehab Admissions Coordinator:   I spoke with this Pt.'s girlfriend and sister regarding 24/7 support and they state that between the two of them, they can provide 24/7 support at discharge from CIR. We now await a bed.   Megan Salon, MS, CCC-SLP Rehab Admissions Coordinator  [phone] (celll) [phone] (office)

## 2021-08-03 ENCOUNTER — Inpatient Hospital Stay (HOSPITAL_COMMUNITY): Payer: Medicaid Other

## 2021-08-03 LAB — GLUCOSE, CAPILLARY
Glucose-Capillary: 99 mg/dL (ref 70–99)
Glucose-Capillary: 101 mg/dL — ABNORMAL HIGH (ref 70–99)
Glucose-Capillary: 117 mg/dL — ABNORMAL HIGH (ref 70–99)
Glucose-Capillary: 117 mg/dL — ABNORMAL HIGH (ref 70–99)

## 2021-08-03 LAB — URINALYSIS, ROUTINE W REFLEX MICROSCOPIC
Bilirubin Urine: NEGATIVE
Glucose, UA: NEGATIVE mg/dL
Hgb urine dipstick: NEGATIVE
Ketones, ur: NEGATIVE mg/dL
Leukocytes,Ua: NEGATIVE
Nitrite: NEGATIVE
Protein, ur: NEGATIVE mg/dL
Specific Gravity, Urine: 1.035 — ABNORMAL HIGH (ref 1.005–1.030)
pH: 5 (ref 5.0–8.0)

## 2021-08-03 LAB — CBC
HCT: 38.8 % — ABNORMAL LOW (ref 39.0–52.0)
Hemoglobin: 13.2 g/dL (ref 13.0–17.0)
MCH: 30.3 pg (ref 26.0–34.0)
MCHC: 34 g/dL (ref 30.0–36.0)
MCV: 89.2 fL (ref 80.0–100.0)
Platelets: 458 10*3/uL — ABNORMAL HIGH (ref 150–400)
RBC: 4.35 MIL/uL (ref 4.22–5.81)
RDW: 12.9 % (ref 11.5–15.5)
WBC: 12.8 10*3/uL — ABNORMAL HIGH (ref 4.0–10.5)
nRBC: 0 % (ref 0.0–0.2)

## 2021-08-03 LAB — BASIC METABOLIC PANEL
Anion gap: 11 (ref 5–15)
BUN: 16 mg/dL (ref 6–20)
CO2: 24 mmol/L (ref 22–32)
Calcium: 9.7 mg/dL (ref 8.9–10.3)
Chloride: 98 mmol/L (ref 98–111)
Creatinine, Ser: 0.98 mg/dL (ref 0.61–1.24)
GFR, Estimated: >60 mL/min (ref >60)
Glucose, Bld: 105 mg/dL — ABNORMAL HIGH (ref 70–99)
Potassium: 4.1 mmol/L (ref 3.5–5.1)
Sodium: 133 mmol/L — ABNORMAL LOW (ref 135–145)

## 2021-08-03 LAB — MAGNESIUM: Magnesium: 2.2 mg/dL (ref 1.7–2.4)

## 2021-08-03 IMAGING — DX DG CHEST 1V PORT
1 series · 1 of 1 positions shown · non-contrast
Comparison: None.

CLINICAL DATA: Seizure, possible infection

EXAM:
PORTABLE CHEST 1 VIEW

[chest]
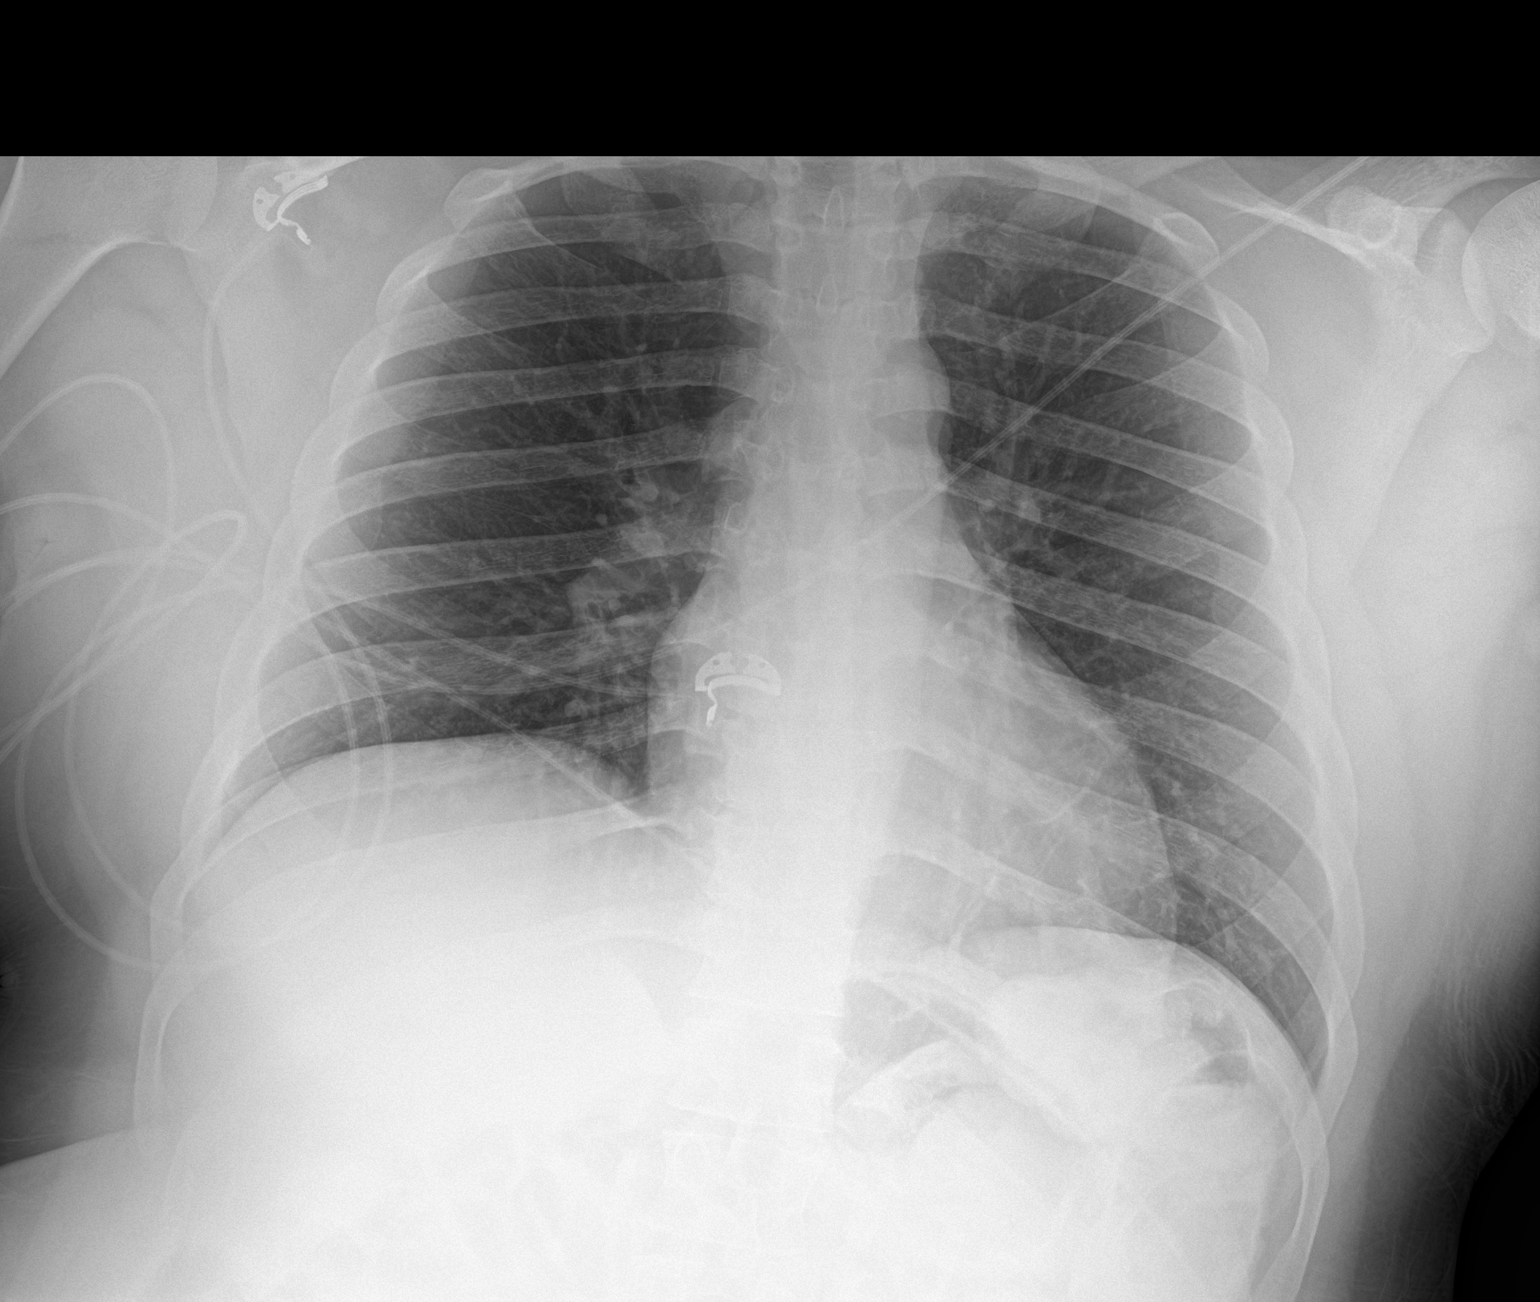

[1 of 1 positions shown; findings below may reference images not displayed]

FINDINGS: The heart size and mediastinal contours are within normal limits.
Both lungs are clear. No pleural effusion. The visualized skeletal
structures are unremarkable.
IMPRESSION: No acute process in the chest.

## 2021-08-03 MED ORDER — ENSURE ENLIVE PO LIQD
237.0000 mL | Freq: Two times a day (BID) | ORAL | Status: DC
Start: 2021-08-03 — End: 2021-08-05
  Administered 2021-08-03 – 2021-08-05 (×6): 237 mL via ORAL

## 2021-08-03 MED ORDER — INSULIN ASPART 100 UNIT/ML IJ SOLN: 0.0000 [IU] | Freq: Three times a day (TID) | INTRAMUSCULAR | Status: DC

## 2021-08-03 NOTE — Progress Notes (Addendum)
Physical Therapy Treatment Patient Details Name: Roy [name] MRN: [id number] DOB: 1996/09/04 Today's Date: 08/03/2021   History of Present Illness Pt is 25 yo male who presents seizures while smoking marijuana and drinking EtOH with fiance at home. 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 No significant PMH.    PT Comments    Pt received in supine and agreeable to therapy session, initially drowsy but alert once lights turned on. Pt with good participation and very motivated but did frequently complain of double vision this date. Pt still requires up to modA +2 physical assist with standing tasks in EVA walker and remains with too much ataxia to progress gait. Pt did not c/o dizziness and was also less diaphoretic during today's session. Pt remains an excellent candidate for AIR to address remaining deficits in strength, mobility, and function as pt is motivated to return to prior level of function. Pt continues to benefit from PT services to progress toward functional mobility goals.      Recommendations for follow up therapy are one component of a multi-disciplinary discharge planning process, led by the attending physician.  Recommendations may be updated based on patient status, additional functional criteria and insurance authorization.  Follow Up Recommendations  Acute inpatient rehab (3hours/day)     Assistance Recommended at Discharge Frequent or constant Supervision/Assistance  Equipment Recommendations  Other (comment) (TBD post-acute)    Recommendations for Other Services       Precautions / Restrictions Precautions Precautions: Fall Precaution Comments: permissive HTN <220/120, R hemi, diplopia, L UE/LE ataxia Restrictions Weight Bearing Restrictions: No     Mobility  Bed Mobility Overal bed mobility: Needs Assistance Bed Mobility: Rolling;Sidelying to  Sit Rolling: Mod assist Sidelying to sit: Mod assist;HOB elevated;+2 for safety/equipment     Sit to sidelying: Mod assist;+2 for safety/equipment General bed mobility comments: mod A to roll with verbal cues and increased time to grasp bed rail and release, HOB slightly elevated to assist with transition    Transfers Overall transfer level: Needs assistance Equipment used:  (EVA walker) Transfers: Sit to/from Stand Sit to Stand: Mod assist;+2 physical assistance   General transfer comment: from elevated bed height>EVA walker x2 trials; trial 1 stood for 1 min 45 sec   Lateral seated scooting: totalA +2, pt attempted to perform but unable to follow sequencing instructions properly for body mechanics and unable to lift bottom off bed while scooting. Will try squat pivot scoots next session rather than seated scooting.   Ambulation/Gait    Pre-gait activities: Sidesteps along EOB x 3-4 with +2 mod to maxA for sidesteps       Modified Rankin (Stroke Patients Only) Modified Rankin (Stroke Patients Only) Pre-Morbid Rankin Score: No symptoms Modified Rankin: Severe disability     Balance Overall balance assessment: Needs assistance Sitting-balance support: Feet supported Sitting balance-Leahy Scale: Fair Sitting balance - Comments: Initially required constant min assist to sit EOB. Progressed to unsupported sitting with CGA for safety.   Standing balance support: Bilateral upper extremity supported;Reliant on assistive device for balance Standing balance-Leahy Scale: Poor Standing balance comment: up to +2 physical assist at EVA walker with dynamic standing tasks      Cognition Arousal/Alertness: Awake/alert Behavior During Therapy: WFL for tasks assessed/performed Overall Cognitive Status: Impaired/Different from baseline Area of Impairment: Attention;Memory;Following commands;Awareness;Problem solving     Current Attention Level: Sustained   Following Commands:  Follows one step commands consistently;Follows  one step commands with increased time Safety/Judgement: Decreased awareness of safety;Decreased awareness of deficits Awareness: Intellectual Problem Solving: Slow processing;Difficulty sequencing;Requires tactile cues;Requires verbal cues General Comments: Pt follows one step commands consistently with increased time; can be slightly impulsive        Exercises Other Exercises Other Exercises: Seated weight shifting and weight bearing through UEs with pressing himself back up into midline x5 with minA Other Exercises: Seated LAQ x5 Other Exercises: supine BUE AROM: wrist/elbow flex/ext x5 reps ea Other Exercises: Static standing for BLE strengthening >30 sec x2 trials    General Comments General comments (skin integrity, edema, etc.): Pt less diaphoretic today, HR up to 145 with standing trials      Pertinent Vitals/Pain Pain Assessment: No/denies pain Pain Intervention(s): Monitored during session     PT Goals (current goals can now be found in the care plan section) Acute Rehab PT Goals Patient Stated Goal: to get stronger PT Goal Formulation: With patient Time For Goal Achievement: 08/11/21 Progress towards PT goals: Progressing toward goals    Frequency    Min 4X/week      PT Plan Current plan remains appropriate       AM-PAC PT "6 Clicks" Mobility   Outcome Measure  Help needed turning from your back to your side while in a flat bed without using bedrails?: A Lot Help needed moving from lying on your back to sitting on the side of a flat bed without using bedrails?: A Lot Help needed moving to and from a bed to a chair (including a wheelchair)?: A Lot Help needed standing up from a chair using your arms (e.g., wheelchair or bedside chair)?: A Lot Help needed to walk in hospital room?: Total Help needed climbing 3-5 steps with a railing? : Total 6 Click Score: 10    End of Session Equipment Utilized During  Treatment: Gait belt Activity Tolerance: Patient tolerated treatment well Patient left: with call bell/phone within reach;in bed;with bed alarm set Nurse Communication: Mobility status PT Visit Diagnosis: Unsteadiness on feet (R26.81);Muscle weakness (generalized) (M62.81);Difficulty in walking, not elsewhere classified (R26.2);Other abnormalities of gait and mobility (R26.89);Ataxic gait (R26.0);Other symptoms and signs involving the nervous system (R29.898)     Time: 4854-6270 PT Time Calculation (min) (ACUTE ONLY): 36 min  Charges:  $Therapeutic Exercise: 8-22 mins $Therapeutic Activity: 8-22 mins                     Harland German, Student PTA CI: Carly P., PTA  Carly M Poff 08/03/2021, 4:16 PM

## 2021-08-03 NOTE — Plan of Care (Signed)
  Problem: Safety: Goal: Non-violent Restraint(s) Outcome: Progressing   Problem: Education: Goal: Knowledge of disease or condition will improve Outcome: Progressing Goal: Knowledge of secondary prevention will improve (SELECT ALL) Outcome: Progressing   Problem: Coping: Goal: Will verbalize positive feelings about self Outcome: Progressing   Problem: Health Behavior/Discharge Planning: Goal: Ability to manage health-related needs will improve Outcome: Progressing   Problem: Nutrition: Goal: Risk of aspiration will decrease Outcome: Progressing   Problem: Ischemic Stroke/TIA Tissue Perfusion: Goal: Complications of ischemic stroke/TIA will be minimized Outcome: Progressing   Problem: Education: Goal: Knowledge of General Education information will improve Description: Including pain rating scale, medication(s)/side effects and non-pharmacologic comfort measures Outcome: Progressing   Problem: Health Behavior/Discharge Planning: Goal: Ability to manage health-related needs will improve Outcome: Progressing   Problem: Clinical Measurements: Goal: Ability to maintain clinical measurements within normal limits will improve Outcome: Progressing Goal: Will remain free from infection Outcome: Progressing Goal: Diagnostic test results will improve Outcome: Progressing Goal: Respiratory complications will improve Outcome: Progressing Goal: Cardiovascular complication will be avoided Outcome: Progressing   Problem: Activity: Goal: Risk for activity intolerance will decrease Outcome: Progressing   Problem: Nutrition: Goal: Adequate nutrition will be maintained Outcome: Progressing   Problem: Coping: Goal: Level of anxiety will decrease Outcome: Progressing   Problem: Elimination: Goal: Will not experience complications related to bowel motility Outcome: Progressing Goal: Will not experience complications related to urinary retention Outcome: Progressing    Problem: Pain Managment: Goal: General experience of comfort will improve Outcome: Progressing   Problem: Safety: Goal: Ability to remain free from injury will improve Outcome: Progressing   Problem: Skin Integrity: Goal: Risk for impaired skin integrity will decrease Outcome: Progressing

## 2021-08-03 NOTE — Progress Notes (Signed)
Nutrition Follow-up  DOCUMENTATION CODES:   Not applicable  INTERVENTION:  -Ensure Enlive po BID, each supplement provides 350 kcal and 20 grams of protein -Continue MVI with minerals daily  NUTRITION DIAGNOSIS:   Inadequate oral intake related to acute illness as evidenced by meal completion < 50%.  updated  GOAL:   Patient will meet greater than or equal to 90% of their needs  progressing  MONITOR:   PO intake, Supplement acceptance, Labs, Weight trends, I & O's, Skin  REASON FOR ASSESSMENT:   Consult Enteral/tube feeding initiation and management  ASSESSMENT:   Pt with PMH of ETOH and marijuana use admitted from home with seizures, acute toxic encephalopathy presumed due to substance use. Per EMS vomited x 3.  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   Pt reports that his appetite is good and that he has been eating and drinking well. Pt is eager to discharge to rehab and states he is bored.   PO intake: 25-50% x 3 recorded meals (41% avg meal intake)  Medications: colace, folvite, SSI TID w/ meals, MVI with minerals, miralax, thiamine Labs: 133 (L) CBGs: 83-129 x 24 hours  Diet Order:   Diet Order             Diet regular Room service appropriate? Yes; Fluid consistency: Thin  Diet effective now                   EDUCATION NEEDS:   No education needs have been identified at this time  Skin:  Skin Assessment: Reviewed RN Assessment (groin incision)  Last BM:  11/13  Height:   Ht Readings from Last 1 Encounters:  07/27/21 5\' 2"  (1.575 m)    Weight:   Wt Readings from Last 1 Encounters:  08/01/21 98.3  kg    BMI:  Body mass index is 39.64 kg/m.  Estimated Nutritional Needs:   Kcal:  2200-2400  Protein:  105-125 grams  Fluid:  > 2 L/day     08/03/21., MS, RD, LDN (she/her/hers) RD pager number and weekend/on-call pager number located in Amion.

## 2021-08-03 NOTE — Progress Notes (Signed)
Inpatient Rehab Admissions Coordinator:   I do not have a bed for this pt. On CIR today. I will continue to follow for potential admit pending insurance auth and bed availability.  Megan Salon, MS, CCC-SLP Rehab Admissions Coordinator  [phone] (celll) [phone] (office)

## 2021-08-03 NOTE — Progress Notes (Addendum)
STROKE TEAM PROGRESS NOTE  INTERVAL HISTORY His girlfriendis at the bedside at time of this exam.  No new neurological events overnight. Patient is very interactive and in good spirits, laughing and joking around.  Informed them we are in the process of arranging CIR and will keep them updated.   Vitals:   08/02/21 2024 08/02/21 2322 08/03/21 0355 08/03/21 0733  BP: 108/83 125/82 123/77 126/79  Pulse: 91 91 77 82  Resp: 16 18 18 18   Temp: 98.6 F (37 C) 98.3 F (36.8 C) 98.1 F (36.7 C) 97.9 F (36.6 C)  TempSrc: Oral Oral Oral   SpO2: 96% 98% 99% 100%  Weight:      Height:       CBC:  Recent Labs  Lab 07/28/21 0519 07/29/21 0647 07/30/21 0306 08/02/21 0753 08/03/21 0210  WBC 11.6* 10.3   < > 11.0* 12.8*  NEUTROABS 8.4* 6.8  --   --   --   HGB 11.5* 11.4*   < > 13.6 13.2  HCT 34.1* 34.0*   < > 40.0 38.8*  MCV 91.4 90.2   < > 89.1 89.2  PLT 313 334   < > 495* 458*   < > = values in this interval not displayed.    Basic Metabolic Panel:  Recent Labs  Lab 07/29/21 1631 07/30/21 0306 07/31/21 0324 08/02/21 0753 08/03/21 0210  NA  --  134*   < > 133* 133*  K  --  4.0   < > 4.0 4.1  CL  --  102   < > 101 98  CO2  --  22   < > 22 24  GLUCOSE  --  95   < > 112* 105*  BUN  --  12   < > 18 16  CREATININE  --  0.84   < > 1.05 0.98  CALCIUM  --  8.9   < > 9.5 9.7  MG 2.2 2.0   < > 2.2 2.2  PHOS 3.2 4.1  --   --   --    < > = values in this interval not displayed.    Lipid Panel:  Recent Labs  Lab 07/28/21 0519  CHOL 199  TRIG 230*  HDL 37*  CHOLHDL 5.4  VLDL 46*  LDLCALC 116*    HgbA1c:  No results for input(s): HGBA1C in the last 168 hours.  Urine Drug Screen:  No results for input(s): LABOPIA, COCAINSCRNUR, LABBENZ, AMPHETMU, THCU, LABBARB in the last 168 hours.   Alcohol Level  No results for input(s): ETH in the last 168 hours.   IMAGING past 24 hours No results found.  PHYSICAL EXAM  Temp:  [97.9 F (36.6 C)-98.9 F (37.2 C)] 97.9 F  (36.6 C) (11/15 0733) Pulse Rate:  [77-109] 82 (11/15 0733) Resp:  [16-20] 18 (11/15 0733) BP: (108-143)/(77-97) 126/79 (11/15 0733) SpO2:  [96 %-100 %] 100 % (11/15 0733)  General - Well nourished, well developed, in no apparent distress.  Ophthalmologic - fundi not visualized due to noncooperation.  Cardiovascular - Regular rhythm and rate.  Neuro - awake, alert, eyes open, orientated to age, place, year but stated December on month instead of November. No aphasia, however, moderate dysarthria, following all simple commands. Able to name and repeat in dysarthric voice. Left INO, right gaze right eye nystagmus, b/l upward gaze with up beat nystagmus. Visual field full, PERRL. Mild right facial droop. Tongue protrusion to the right slightly. LUE proximal 2/5, bicep 4/5,  tricep 2+/5, finger grip 4-/5. RUE proximal 3/5, bicep 4/5, tricep 3/5, finger grip 4-/5. BLE proximal 3/5 and distal ankle DF 4+/5. Sensation decreased on the left, b/l FTN ataxic proportional to the weakness, gait not tested.    ASSESSMENT/PLAN 25 year old male with history of obesity admitted for unresponsiveness and shaking jerking movements.  This symptoms happened after THC and alcohol abuse, and during sexual activity he fell, had left-sided numbness, diaphoresis, headache, nausea vomiting and seizure-like activity.  He received Versed Ativan and Keppra for presumed seizure activity.  Intubated in the ER, CT negative, CT head and neck showed left V1 dissection with thrombus formation, distal basilar artery thrombosis, right P2 and left P1 occlusion due to thrombus.  Status post IR with TICI2c of BA and PCAs, also left VA stenting.  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  CT head No  acute abnormality. CTA head & neck Intimal irregularity with filling defect involving the proximal left vertebral artery, V1 segment, concerning for acute arterial dissection. Subtle irregular nonocclusive filling defect involving the distal basilar artery/basilar tip, consistent with associated distal embolic disease. Adjacent superior cerebellar arteries are not visualized at their origins, and could be occluded. Associated partially occlusive thrombus with severe stenosis involving the left P1 segment. Additional distal right P2 embolic occlusion  IR S/P revascularization of occuded distal basilar artery and both PCAs and with stent assisted angioplasty for symptomatic prox LT VA dissection  Post IR CT  No acute intracranial pathology.  MRI multifocal infarcts involving bilateral superior cerebellum, left pons, central midbrain, right thalamus, medial temporal and occipital lobes  2D Echo EF 60-65%, normal LA, no IA shunt LDL 116 HgbA1c 5.7 VTE prophylaxis - lovenox No antithrombotic prior to admission, now on aspirin 81 mg daily and Brilinta (ticagrelor) 90 mg bid given stenting   Therapy recommendations:  CIR Disposition:  pending   Involuntary movement EEG Excessive beta activity.  No seizures Repeat EEG: within normal limits. No seizures or epileptiform activity noted.  Per Dr. Pearlean Brownie "appear to be stimulus sensitive myoclonus versus tremors"  Could be related to basilar artery occlusion, double seizures No AED needed at this time  Hyperlipidemia Home meds:  none LDL 116, goal < 70 lipitor 40mg     Continue statin at discharge   Tobacco abuse Current smoker Smoking cessation counseling provided Pt is willing to quit  Dysphagia  On regular diet However, po intake not adequate as expected Still has cortrak placed Encourage po intake  Leukocytosis  WBC 11.6-10.3-9.5-10.8-10.8->11.0->12.8 Afebrile UA pending CXR pending  Other Stroke Risk Factors  Substance abuse - UDS:   THC POSITIVE. Patient advised to stop using due to stroke risk.  Obesity, Body mass index is 42.38 kg/m., BMI >/= 30 associated with increased stroke risk, recommend weight loss, diet and exercise as appropriate  Alcohol abuse - limiting alcohol use education provided - on B1/FA/MVI   Other Active Problems Respiratory failure, resolved  Depression on zoloft, seroquel Pink color tinged urine - continue monitoring  Hospital day # 8  ATTENDING NOTE: I reviewed above note and agree with the assessment and plan. Pt was seen and examined.   No family at bedside, NT are doing bath for the patient.  Patient voiced no complaints, neuro stable, no acute event overnight.  Still pending CIR.  Leukocytosis seems slightly worsening, WBC 12.8, up from yesterday 11.0.  Will check UA, CXR.  Continue current medication.  For detailed assessment and plan, please refer to above as I have made changes wherever appropriate.   Marvel Plan, MD PhD Stroke Neurology 08/03/2021 4:56 PM       To contact Stroke Continuity provider, please refer to [email or web address]. After hours, contact General Neurology

## 2021-08-03 NOTE — Progress Notes (Signed)
   08/02/21 1500  SLP Visit Information  SLP Received On 08/03/21  General Information  HPI 25 y.o M presenting with acute onset seizures after having significant headaches for three days and intake of alcohol and marajuana. Imaging revealed Acute infarcts involving the right thalamus, medial aspect of the  right occipital and temporal lobes, including right hippocampus,  centrally in the midbrain, on the left side of the pons and in the  bilateral cerebellar hemispheres. Petechial hemorrhage seen in the  left inferior cerebellar hemisphere right occipital lobe. Intubated 11/7-11/8  Treatment Provided  Treatment provided Cognitive-Linquistic;Dysphagia  Dysphagia Treatment  Temperature Spikes Noted No  Oral Cavity - Dentition Adequate natural dentition  Respiratory Status Room air  Feeding Able to feed self  Patient observed directly with PO's Yes  Liquids provided via Cup  Family/Caregiver Educated girlfriend  Treatment Methods Skilled observation;Patient/caregiver education;Compensation strategy training  Type of PO's observed Thin liquids  Pain Assessment  Pain Assessment No/denies pain  Pain Intervention(s) Monitored during session  Cognitive-Linquistic Treatment  Treatment focused on Dysarthria  Skilled Treatment Pt demonstrates self correction of unintelligible phrases with slow rate and over articulation. Independence with reading written phrases. Increased cueing needed with picture description and cueing needed for revision in 8/10 opportunities in conversation. Pt particularly needs cues to articulate all sounds in multisyllabic words at the ned of a phrase or sentence. /sh/ is a difficult sound for him. Pt tolerates regualr solids and thin liquids. No f/u needed for dysphagia.   Family/Caregiver Educated girlfriend  SLP - End of Session  Patient left in bed  Assessment / Recommendations / Plan  Plan Continue with current plan of care  Dysphagia Recommendations  Diet  recommendations Regular;Thin liquid  General Recommendations  Follow Up Recommendations Acute inpatient rehab (3hours/day)  Progression Toward Goals  Progression toward goals Goals met and updated - see care plan  SLP Time Calculation  SLP Start Time (ACUTE ONLY) 1410  SLP Stop Time (ACUTE ONLY) 1425  SLP Time Calculation (min) (ACUTE ONLY) 15 min  SLP Evaluations  $ SLP Speech Visit 1 Visit  SLP Evaluations  $Speech Treatment for Individual 1 Procedure

## 2021-08-04 LAB — CBC
HCT: 37.9 % — ABNORMAL LOW (ref 39.0–52.0)
Hemoglobin: 13.2 g/dL (ref 13.0–17.0)
MCH: 31 pg (ref 26.0–34.0)
MCHC: 34.8 g/dL (ref 30.0–36.0)
MCV: 89 fL (ref 80.0–100.0)
Platelets: 508 10*3/uL — ABNORMAL HIGH (ref 150–400)
RBC: 4.26 MIL/uL (ref 4.22–5.81)
RDW: 12.9 % (ref 11.5–15.5)
WBC: 12.9 10*3/uL — ABNORMAL HIGH (ref 4.0–10.5)
nRBC: 0 % (ref 0.0–0.2)

## 2021-08-04 LAB — GLUCOSE, CAPILLARY
Glucose-Capillary: 101 mg/dL — ABNORMAL HIGH (ref 70–99)
Glucose-Capillary: 105 mg/dL — ABNORMAL HIGH (ref 70–99)
Glucose-Capillary: 114 mg/dL — ABNORMAL HIGH (ref 70–99)
Glucose-Capillary: 107 mg/dL — ABNORMAL HIGH (ref 70–99)

## 2021-08-04 LAB — BASIC METABOLIC PANEL
Anion gap: 12 (ref 5–15)
BUN: 17 mg/dL (ref 6–20)
CO2: 22 mmol/L (ref 22–32)
Calcium: 9.3 mg/dL (ref 8.9–10.3)
Chloride: 96 mmol/L — ABNORMAL LOW (ref 98–111)
Creatinine, Ser: 0.87 mg/dL (ref 0.61–1.24)
GFR, Estimated: >60 mL/min (ref >60)
Glucose, Bld: 96 mg/dL (ref 70–99)
Potassium: 3.9 mmol/L (ref 3.5–5.1)
Sodium: 130 mmol/L — ABNORMAL LOW (ref 135–145)

## 2021-08-04 LAB — MAGNESIUM: Magnesium: 2.2 mg/dL (ref 1.7–2.4)

## 2021-08-04 NOTE — Progress Notes (Addendum)
Physical Therapy Treatment Patient Details Name: Roy [name] MRN: [id number] DOB: Jan 25, 1996 Today's Date: 08/04/2021   History of Present Illness Pt is 25 yo male who presents seizures while smoking marijuana and drinking EtOH with girlfriend at home. 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 No significant PMH.    PT Comments    Pt received in supine and eagerly agreeable to therapy session. Pt still complaining of double vision and L eye not tracking past midline when gazing to the R, also noted end range upbeat nystagmus on upward gaze. Pt c/o severe abdominal pain while sitting EOB repeating "10, 10, 10" when asked to rate the pain. Pain decreased to 0 once laying down and resting for a few minutes. Deferred transfer training this date due to pain and emphasis instead on supine open and closed chain exercises for strengthening. Pt remains a good candidate for AIR to address remaining deficits as he is very motivated and determined to return to prior level of function. Pt continues to benefit from PT services to progress toward functional mobility goals.      Recommendations for follow up therapy are one component of a multi-disciplinary discharge planning process, led by the attending physician.  Recommendations may be updated based on patient status, additional functional criteria and insurance authorization.  Follow Up Recommendations  Acute inpatient rehab (3hours/day)     Assistance Recommended at Discharge Frequent or constant Supervision/Assistance  Equipment Recommendations  Other (comment) (TBD post-acute)    Recommendations for Other Services       Precautions / Restrictions Precautions Precautions: Fall Precaution Comments: permissive HTN <220/120, R hemi, diplopia/visual deficits Restrictions Weight Bearing Restrictions: No     Mobility  Bed  Mobility Overal bed mobility: Needs Assistance Bed Mobility: Rolling;Sidelying to Sit Rolling: Mod assist Sidelying to sit: Mod assist;HOB elevated;+2 for safety/equipment  Sit to sidelying: Mod assist General bed mobility comments: mod A to roll with verbal cues and increased time to grasp bed rail and release, HOB slightly elevated to assist with transition, assist with BLE management    Transfers    General transfer comment: deferred today due to abdominal pain and pt requesting to lie back down     Modified Rankin (Stroke Patients Only) Modified Rankin (Stroke Patients Only) Pre-Morbid Rankin Score: No symptoms Modified Rankin: Severe disability     Balance Overall balance assessment: Needs assistance Sitting-balance support: Feet supported Sitting balance-Leahy Scale: Fair Sitting balance - Comments: Initially required constant min assist to sit EOB with cues to lean forward, with weight shifting able to sit upright with minG assist Postural control: Posterior lean     Standing balance comment: deferred today due to abdominal pain       Cognition Arousal/Alertness: Awake/alert Behavior During Therapy: WFL for tasks assessed/performed Overall Cognitive Status: Impaired/Different from baseline Area of Impairment: Attention;Memory;Following commands;Awareness;Problem solving   Current Attention Level: Sustained   Following Commands: Follows one step commands consistently;Follows one step commands with increased time Safety/Judgement: Decreased awareness of safety;Decreased awareness of deficits Awareness: Intellectual Problem Solving: Slow processing;Difficulty sequencing;Requires tactile cues;Requires verbal cues General Comments: Pt follows one step commands consistently with increased time; can be slightly impulsive      Exercises Other Exercises Other Exercises: Supine: heel slides x10, hip abduction x10, bridging x4, ankle pumps x10; seated lateral leans with  elbow taps x 5 ea; supine BUE AROM: wrist flex/ext, elbow flex/ext    General  Comments General comments (skin integrity, edema, etc.): While sitting EOB pt c/o abdominal pain with moaning and facial grimacing, when asked 1-10 he kept repeating 10 and requested to lay down, once laying down he said the pain dropped to an 8 and after a few mins it dropped to zero. When working on bridging in supine pt c/o cramp in front L thigh which dissipated upon rest      Pertinent Vitals/Pain Pain Assessment: 0-10 Pain Score: 10-Worst pain ever Faces Pain Scale: Hurts whole lot Facial Expression: Grimacing Pain Location: abdomen, front of L thigh Pain Descriptors / Indicators: Moaning;Cramping Pain Intervention(s): Monitored during session;Utilized relaxation techniques;Limited activity within patient's tolerance     PT Goals (current goals can now be found in the care plan section) Acute Rehab PT Goals Patient Stated Goal: to get stronger PT Goal Formulation: With patient Time For Goal Achievement: 08/11/21 Progress towards PT goals: Progressing toward goals    Frequency    Min 4X/week      PT Plan Current plan remains appropriate       AM-PAC PT "6 Clicks" Mobility   Outcome Measure  Help needed turning from your back to your side while in a flat bed without using bedrails?: A Lot Help needed moving from lying on your back to sitting on the side of a flat bed without using bedrails?: A Lot Help needed moving to and from a bed to a chair (including a wheelchair)?: A Lot Help needed standing up from a chair using your arms (e.g., wheelchair or bedside chair)?: A Lot Help needed to walk in hospital room?: Total Help needed climbing 3-5 steps with a railing? : Total 6 Click Score: 10    End of Session Equipment Utilized During Treatment: Gait belt Activity Tolerance: Patient limited by pain Patient left: with call bell/phone within reach;in bed;with bed alarm set;with nursing/sitter  in room (NT in room upon therapist exit) Nurse Communication: Mobility status;Other (comment) (Pt c/o abdominal pain, RN notified) PT Visit Diagnosis: Unsteadiness on feet (R26.81);Muscle weakness (generalized) (M62.81);Difficulty in walking, not elsewhere classified (R26.2);Other abnormalities of gait and mobility (R26.89);Ataxic gait (R26.0);Other symptoms and signs involving the nervous system (R29.898)     Time: 5456-2563 PT Time Calculation (min) (ACUTE ONLY): 35 min  Charges:  $Therapeutic Exercise: 8-22 mins $Therapeutic Activity: 8-22 mins                     Harland German, Student PTA CI: Carly P., PTA  Carly M Poff 08/04/2021, 5:28 PM

## 2021-08-04 NOTE — Progress Notes (Signed)
Patient's girlfriend called and said pt is c/o pain on left side of ribs. Nurse went in the room, pt said three balls of glass on the back, but denied of pain, moment later acted feeling uncomfortable for 5-10 second, suddenly started laughing, seemed relaxed, again in a moment his left side more of lower left leg started seizing and stiffed lasted for 5 sec and back to relaxed mode and seemed happy. It continued  back and forth for 20 mins, VTs were within normal range, no new  neurological change compared to previous assessment, assessed on the back skin seemed normal, no swelling, no s/s discoloration, no tenderness, normal skin temp., swallowed schedule medication without any difficulty. Called RRT nurse Onalee Hua to come and assess pt FOR MAKING SURE AND  Onalee Hua did not find any concern ON PATIENTS CONDITION, PT WAS RESTING AT THAT TIME. SO WE WILL CONTINUE TO MONITOR CLOSELY FOR ANY ABNORMAL S/S FURTHER.

## 2021-08-04 NOTE — Progress Notes (Signed)
Inpatient Rehab Admissions Coordinator:   I do not have a bed for this Pt. On CIR today. I will continue to follow for potential admit pending bed availability.  Garret Teale, MS, CCC-SLP Rehab Admissions Coordinator  [phone] (celll) [phone] (office)  

## 2021-08-04 NOTE — Progress Notes (Addendum)
STROKE TEAM PROGRESS NOTE  INTERVAL HISTORY No one is at the bedside atime of this exam No new neurological events overnight. Patient is very interactive and in good spirits, laughing and joking around.  Informed them we are in the process of arranging CIR and will keep them updated.   Vitals:   08/04/21 0013 08/04/21 0337 08/04/21 0500 08/04/21 0732  BP: 118/68 128/75  118/72  Pulse: 87 74  70  Resp: 20 14  18   Temp: 98 F (36.7 C) 97.9 F (36.6 C)  97.9 F (36.6 C)  TempSrc:    Oral  SpO2: 98% 96%  96%  Weight:   99.1 kg   Height:       CBC:  Recent Labs  Lab 07/29/21 0647 07/30/21 0306 08/03/21 0210 08/04/21 0132  WBC 10.3   < > 12.8* 12.9*  NEUTROABS 6.8  --   --   --   HGB 11.4*   < > 13.2 13.2  HCT 34.0*   < > 38.8* 37.9*  MCV 90.2   < > 89.2 89.0  PLT 334   < > 458* 508*   < > = values in this interval not displayed.    Basic Metabolic Panel:  Recent Labs  Lab 07/29/21 1631 07/30/21 0306 07/31/21 0324 08/03/21 0210 08/04/21 0132  NA  --  134*   < > 133* 130*  K  --  4.0   < > 4.1 3.9  CL  --  102   < > 98 96*  CO2  --  22   < > 24 22  GLUCOSE  --  95   < > 105* 96  BUN  --  12   < > 16 17  CREATININE  --  0.84   < > 0.98 0.87  CALCIUM  --  8.9   < > 9.7 9.3  MG 2.2 2.0   < > 2.2 2.2  PHOS 3.2 4.1  --   --   --    < > = values in this interval not displayed.    Lipid Panel:  No results for input(s): CHOL, TRIG, HDL, CHOLHDL, VLDL, LDLCALC in the last 168 hours.  HgbA1c:  No results for input(s): HGBA1C in the last 168 hours.  Urine Drug Screen:  No results for input(s): LABOPIA, COCAINSCRNUR, LABBENZ, AMPHETMU, THCU, LABBARB in the last 168 hours.   Alcohol Level  No results for input(s): ETH in the last 168 hours.   IMAGING past 24 hours DG CHEST PORT 1 VIEW  Result Date: 08/03/2021 CLINICAL DATA:  Seizure, possible infection EXAM: PORTABLE CHEST 1 VIEW COMPARISON:  None. FINDINGS: The heart size and mediastinal contours are within  normal limits. Both lungs are clear. No pleural effusion. The visualized skeletal structures are unremarkable. IMPRESSION: No acute process in the chest. Electronically Signed   By: 08/05/2021 M.D.   On: 08/03/2021 18:10    PHYSICAL EXAM  Temp:  [97.9 F (36.6 C)-98 F (36.7 C)] 97.9 F (36.6 C) (11/16 0732) Pulse Rate:  [70-93] 70 (11/16 0732) Resp:  [14-20] 18 (11/16 0732) BP: (118-152)/(67-94) 118/72 (11/16 0732) SpO2:  [96 %-100 %] 96 % (11/16 0732) Weight:  [99.1 kg] 99.1 kg (11/16 0500)  General - Well nourished, well developed, in no apparent distress.  Ophthalmologic - fundi not visualized due to noncooperation.  Cardiovascular - Regular rhythm and rate.  Neuro - awake, alert, eyes open, orientated to age, place, year but stated December on month  instead of November. No aphasia, however, moderate dysarthria, following all simple commands. Able to name and repeat in dysarthric voice. Left INO, right gaze right eye nystagmus, b/l upward gaze with up beat nystagmus. Visual field full, PERRL. Mild right facial droop. Tongue protrusion to the right slightly. LUE proximal 2/5, bicep 4/5, tricep 2+/5, finger grip 4-/5. RUE proximal 3/5, bicep 4/5, tricep 3/5, finger grip 4-/5. BLE proximal 3/5 and distal ankle DF 4+/5. Sensation decreased on the left, b/l FTN ataxic proportional to the weakness, gait not tested.    ASSESSMENT/PLAN 25 year old male with history of obesity admitted for unresponsiveness and shaking jerking movements.  This symptoms happened after THC and alcohol abuse, and during sexual activity he fell, had left-sided numbness, diaphoresis, headache, nausea vomiting and seizure-like activity.  He received Versed Ativan and Keppra for presumed seizure activity.  Intubated in the ER, CT negative, CT head and neck showed left V1 dissection with thrombus formation, distal basilar artery thrombosis, right P2 and left P1 occlusion due to thrombus.  Status post IR with TICI2c of  BA and PCAs, also left VA stenting.  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  CT head No acute abnormality. CTA head & neck Intimal irregularity with filling defect involving the proximal left vertebral artery, V1 segment, concerning for acute arterial dissection. Subtle irregular nonocclusive filling defect involving the distal basilar artery/basilar tip, consistent with associated distal embolic disease. Adjacent superior cerebellar arteries are not visualized at their origins, and could be occluded. Associated partially occlusive thrombus with severe stenosis involving the left P1 segment. Additional distal right P2 embolic occlusion  IR S/P revascularization of occuded distal basilar artery and both PCAs and with stent assisted angioplasty for symptomatic prox LT VA dissection  Post IR CT  No acute intracranial pathology.  MRI multifocal infarcts involving bilateral superior cerebellum, left pons, central midbrain, right thalamus, medial temporal and occipital lobes  2D Echo EF 60-65%, normal LA, no IA shunt LDL 116 HgbA1c 5.7 VTE prophylaxis - lovenox No antithrombotic prior to admission, now on aspirin 81 mg daily and Brilinta (ticagrelor) 90 mg bid given stenting   Therapy recommendations:  CIR Disposition:  pending   Involuntary movement EEG Excessive beta activity.  No seizures Repeat EEG: within normal limits. No seizures or epileptiform activity noted.  Per Dr. Pearlean Brownie "appear to be stimulus sensitive myoclonus versus tremors"  Could be related to basilar artery occlusion, double seizures No AED needed at this time  Hyperlipidemia Home meds:  none LDL 116, goal < 70 lipitor 40mg     Continue statin at discharge   Tobacco abuse Current  smoker Smoking cessation counseling provided Pt is willing to quit  Dysphagia  On regular diet Cortrak has been removed Encourage po intake  Leukocytosis  WBC 11.6-10.3-9.5-10.8-10.8->11.0->12.8->12.9 Afebrile UA negative CXR unremarkable  Other Stroke Risk Factors  Substance abuse - UDS:  THC POSITIVE. Patient advised to stop using due to stroke risk.  Obesity, Body mass index is 42.38 kg/m., BMI >/= 30 associated with increased stroke risk, recommend weight loss, diet and exercise as appropriate  Alcohol abuse - limiting alcohol use education provided - on B1/FA/MVI   Other Active Problems Respiratory failure, resolved  Depression on zoloft, seroquel Pink color tinged urine - resolved  Hospital day # 9  ATTENDING  NOTE: I reviewed above note and agree with the assessment and plan. Pt was seen and examined.   No acute event overnight, neuro stable.  Pending CIR, likely tomorrow.  Continue aspirin and Brilinta as well as Lipitor.  Still has mild leukocytosis, however work-up negative so far.  Continue monitoring.  For detailed assessment and plan, please refer to above as I have made changes wherever appropriate.   Marvel Plan, MD PhD Stroke Neurology 08/04/2021 6:13 PM        To contact Stroke Continuity provider, please refer to [email or web address]. After hours, contact General Neurology

## 2021-08-05 ENCOUNTER — Other Ambulatory Visit: Payer: Self-pay

## 2021-08-05 ENCOUNTER — Inpatient Hospital Stay (HOSPITAL_COMMUNITY)
Admission: RE | Admit: 2021-08-05 | Discharge: 2021-09-10 | DRG: 092 | Disposition: A | Discharge status: Home / Self Care | Payer: Medicaid Other | Source: Intra-hospital | Attending: Physical Medicine & Rehabilitation | Admitting: Physical Medicine & Rehabilitation

## 2021-08-05 ENCOUNTER — Encounter (HOSPITAL_COMMUNITY): Payer: Self-pay | Admitting: Physical Medicine & Rehabilitation

## 2021-08-05 DIAGNOSIS — E871 Hypo-osmolality and hyponatremia: Secondary | ICD-10-CM | POA: Diagnosis present

## 2021-08-05 DIAGNOSIS — R2689 Other abnormalities of gait and mobility: Principal | ICD-10-CM | POA: Diagnosis present

## 2021-08-05 DIAGNOSIS — Z95828 Presence of other vascular implants and grafts: Secondary | ICD-10-CM | POA: Diagnosis not present

## 2021-08-05 DIAGNOSIS — I69319 Unspecified symptoms and signs involving cognitive functions following cerebral infarction: Secondary | ICD-10-CM

## 2021-08-05 DIAGNOSIS — G479 Sleep disorder, unspecified: Secondary | ICD-10-CM

## 2021-08-05 DIAGNOSIS — R4189 Other symptoms and signs involving cognitive functions and awareness: Secondary | ICD-10-CM

## 2021-08-05 DIAGNOSIS — I69392 Facial weakness following cerebral infarction: Secondary | ICD-10-CM | POA: Diagnosis not present

## 2021-08-05 DIAGNOSIS — Z83438 Family history of other disorder of lipoprotein metabolism and other lipidemia: Secondary | ICD-10-CM

## 2021-08-05 DIAGNOSIS — G441 Vascular headache, not elsewhere classified: Secondary | ICD-10-CM | POA: Diagnosis not present

## 2021-08-05 DIAGNOSIS — K5901 Slow transit constipation: Secondary | ICD-10-CM | POA: Diagnosis present

## 2021-08-05 DIAGNOSIS — I6350 Cerebral infarction due to unspecified occlusion or stenosis of unspecified cerebral artery: Secondary | ICD-10-CM | POA: Diagnosis present

## 2021-08-05 DIAGNOSIS — D72823 Leukemoid reaction: Secondary | ICD-10-CM | POA: Diagnosis not present

## 2021-08-05 DIAGNOSIS — G4700 Insomnia, unspecified: Secondary | ICD-10-CM | POA: Diagnosis present

## 2021-08-05 DIAGNOSIS — E785 Hyperlipidemia, unspecified: Secondary | ICD-10-CM | POA: Diagnosis present

## 2021-08-05 DIAGNOSIS — D72829 Elevated white blood cell count, unspecified: Secondary | ICD-10-CM | POA: Diagnosis present

## 2021-08-05 DIAGNOSIS — I69398 Other sequelae of cerebral infarction: Secondary | ICD-10-CM | POA: Diagnosis not present

## 2021-08-05 DIAGNOSIS — Z6839 Body mass index (BMI) 39.0-39.9, adult: Secondary | ICD-10-CM | POA: Diagnosis not present

## 2021-08-05 DIAGNOSIS — Z713 Dietary counseling and surveillance: Secondary | ICD-10-CM

## 2021-08-05 DIAGNOSIS — I69322 Dysarthria following cerebral infarction: Secondary | ICD-10-CM

## 2021-08-05 DIAGNOSIS — R7401 Elevation of levels of liver transaminase levels: Secondary | ICD-10-CM | POA: Diagnosis not present

## 2021-08-05 DIAGNOSIS — I69393 Ataxia following cerebral infarction: Secondary | ICD-10-CM

## 2021-08-05 DIAGNOSIS — Z7151 Drug abuse counseling and surveillance of drug abuser: Secondary | ICD-10-CM | POA: Diagnosis not present

## 2021-08-05 DIAGNOSIS — R251 Tremor, unspecified: Secondary | ICD-10-CM | POA: Diagnosis not present

## 2021-08-05 DIAGNOSIS — Z8249 Family history of ischemic heart disease and other diseases of the circulatory system: Secondary | ICD-10-CM

## 2021-08-05 DIAGNOSIS — R451 Restlessness and agitation: Secondary | ICD-10-CM | POA: Diagnosis present

## 2021-08-05 DIAGNOSIS — R7989 Other specified abnormal findings of blood chemistry: Secondary | ICD-10-CM | POA: Diagnosis not present

## 2021-08-05 LAB — BASIC METABOLIC PANEL
Anion gap: 12 (ref 5–15)
BUN: 17 mg/dL (ref 6–20)
CO2: 22 mmol/L (ref 22–32)
Calcium: 9.5 mg/dL (ref 8.9–10.3)
Chloride: 98 mmol/L (ref 98–111)
Creatinine, Ser: 0.88 mg/dL (ref 0.61–1.24)
GFR, Estimated: >60 mL/min (ref >60)
Glucose, Bld: 98 mg/dL (ref 70–99)
Potassium: 4.2 mmol/L (ref 3.5–5.1)
Sodium: 132 mmol/L — ABNORMAL LOW (ref 135–145)

## 2021-08-05 LAB — CBC
HCT: 38 % — ABNORMAL LOW (ref 39.0–52.0)
Hemoglobin: 12.6 g/dL — ABNORMAL LOW (ref 13.0–17.0)
MCH: 29.8 pg (ref 26.0–34.0)
MCHC: 33.2 g/dL (ref 30.0–36.0)
MCV: 89.8 fL (ref 80.0–100.0)
Platelets: 502 10*3/uL — ABNORMAL HIGH (ref 150–400)
RBC: 4.23 MIL/uL (ref 4.22–5.81)
RDW: 12.8 % (ref 11.5–15.5)
WBC: 12 10*3/uL — ABNORMAL HIGH (ref 4.0–10.5)
nRBC: 0 % (ref 0.0–0.2)

## 2021-08-05 LAB — GLUCOSE, CAPILLARY
Glucose-Capillary: 99 mg/dL (ref 70–99)
Glucose-Capillary: 105 mg/dL — ABNORMAL HIGH (ref 70–99)

## 2021-08-05 LAB — MAGNESIUM: Magnesium: 2.2 mg/dL (ref 1.7–2.4)

## 2021-08-05 MED ORDER — QUETIAPINE FUMARATE ER 50 MG PO TB24
50.0000 mg | ORAL_TABLET | Freq: Every day | ORAL | Status: DC
  Administered 2021-08-05 – 2021-08-29 (×25): 50 mg via ORAL
  Filled 2021-08-05 (×25): qty 1

## 2021-08-05 MED ORDER — MELATONIN 3 MG PO TABS
3.0000 mg | ORAL_TABLET | Freq: Every day | ORAL | Status: DC
  Administered 2021-08-05 – 2021-08-21 (×17): 3 mg via ORAL
  Filled 2021-08-05 (×17): qty 1

## 2021-08-05 MED ORDER — SERTRALINE HCL 100 MG PO TABS
100.0000 mg | ORAL_TABLET | Freq: Every day | ORAL | 1 refills | Status: DC
Start: 2021-08-06 — End: 2021-09-09

## 2021-08-05 MED ORDER — TICAGRELOR 90 MG PO TABS
90.0000 mg | ORAL_TABLET | Freq: Two times a day (BID) | ORAL | Status: DC
  Filled 2021-08-05 (×38): qty 1

## 2021-08-05 MED ORDER — DOCUSATE SODIUM 100 MG PO CAPS
100.0000 mg | ORAL_CAPSULE | Freq: Two times a day (BID) | ORAL | Status: DC
  Administered 2021-08-05 – 2021-08-26 (×43): 100 mg via ORAL
  Filled 2021-08-05 (×46): qty 1

## 2021-08-05 MED ORDER — ACETAMINOPHEN 160 MG/5ML PO SOLN
650.0000 mg | ORAL | Status: DC | PRN
  Administered 2021-08-08 – 2021-08-30 (×2): 650 mg
  Filled 2021-08-05 (×2): qty 20.3

## 2021-08-05 MED ORDER — ENOXAPARIN SODIUM 40 MG/0.4ML IJ SOSY
40.0000 mg | PREFILLED_SYRINGE | INTRAMUSCULAR | Status: DC
  Administered 2021-08-05 – 2021-09-09 (×36): 40 mg via SUBCUTANEOUS
  Filled 2021-08-05 (×36): qty 0.4

## 2021-08-05 MED ORDER — ATORVASTATIN CALCIUM 40 MG PO TABS: 40.0000 mg | ORAL_TABLET | Freq: Every day | ORAL | 0 refills | Status: DC

## 2021-08-05 MED ORDER — ATORVASTATIN CALCIUM 40 MG PO TABS
40.0000 mg | ORAL_TABLET | Freq: Every day | ORAL | Status: DC
  Administered 2021-08-05 – 2021-08-09 (×5): 40 mg via ORAL
  Filled 2021-08-05 (×5): qty 1

## 2021-08-05 MED ORDER — DOCUSATE SODIUM 100 MG PO CAPS: 100.0000 mg | ORAL_CAPSULE | Freq: Two times a day (BID) | ORAL | 0 refills | Status: DC

## 2021-08-05 MED ORDER — POLYETHYLENE GLYCOL 3350 17 G PO PACK
17.0000 g | PACK | Freq: Every day | ORAL | Status: DC
  Administered 2021-08-06 – 2021-08-21 (×16): 17 g via ORAL
  Filled 2021-08-05 (×17): qty 1

## 2021-08-05 MED ORDER — ACETAMINOPHEN 325 MG PO TABS: 650.0000 mg | ORAL_TABLET | ORAL | 0 refills | Status: DC | PRN

## 2021-08-05 MED ORDER — THIAMINE HCL 100 MG PO TABS
100.0000 mg | ORAL_TABLET | Freq: Every day | ORAL | Status: DC
  Administered 2021-08-06 – 2021-09-02 (×28): 100 mg via ORAL
  Filled 2021-08-05 (×28): qty 1

## 2021-08-05 MED ORDER — ENSURE ENLIVE PO LIQD
237.0000 mL | Freq: Two times a day (BID) | ORAL | Status: DC
  Administered 2021-08-06 – 2021-09-10 (×42): 237 mL via ORAL

## 2021-08-05 MED ORDER — TICAGRELOR 90 MG PO TABS: 90.0000 mg | ORAL_TABLET | Freq: Two times a day (BID) | ORAL | 1 refills | Status: DC

## 2021-08-05 MED ORDER — ADULT MULTIVITAMIN W/MINERALS CH
1.0000 | ORAL_TABLET | Freq: Every day | ORAL | Status: DC
  Administered 2021-08-06 – 2021-09-02 (×28): 1 via ORAL
  Filled 2021-08-05 (×29): qty 1

## 2021-08-05 MED ORDER — MELATONIN 3 MG PO TABS: 3.0000 mg | ORAL_TABLET | Freq: Every day | ORAL | 0 refills | Status: DC

## 2021-08-05 MED ORDER — THIAMINE HCL 100 MG PO TABS: 100.0000 mg | ORAL_TABLET | Freq: Every day | ORAL | 0 refills | Status: DC

## 2021-08-05 MED ORDER — ACETAMINOPHEN 650 MG RE SUPP: 650.0000 mg | RECTAL | Status: DC | PRN

## 2021-08-05 MED ORDER — INSULIN ASPART 100 UNIT/ML IJ SOLN: 0.0000 [IU] | Freq: Three times a day (TID) | INTRAMUSCULAR | 11 refills | Status: DC

## 2021-08-05 MED ORDER — SERTRALINE HCL 100 MG PO TABS
100.0000 mg | ORAL_TABLET | Freq: Every day | ORAL | Status: DC
  Administered 2021-08-06 – 2021-09-10 (×36): 100 mg via ORAL
  Filled 2021-08-05 (×36): qty 1

## 2021-08-05 MED ORDER — FOLIC ACID 1 MG PO TABS: 1.0000 mg | ORAL_TABLET | Freq: Every day | ORAL | 0 refills | Status: DC

## 2021-08-05 MED ORDER — TICAGRELOR 90 MG PO TABS
90.0000 mg | ORAL_TABLET | Freq: Two times a day (BID) | ORAL | Status: DC
  Administered 2021-08-05 – 2021-09-10 (×72): 90 mg via ORAL
  Filled 2021-08-05 (×70): qty 1

## 2021-08-05 MED ORDER — QUETIAPINE FUMARATE ER 50 MG PO TB24: 50.0000 mg | ORAL_TABLET | Freq: Every day | ORAL | 0 refills | Status: DC

## 2021-08-05 MED ORDER — ACETAMINOPHEN 325 MG PO TABS
650.0000 mg | ORAL_TABLET | ORAL | Status: DC | PRN
  Administered 2021-08-21 – 2021-09-05 (×3): 650 mg via ORAL
  Filled 2021-08-05 (×3): qty 2

## 2021-08-05 MED ORDER — ADULT MULTIVITAMIN W/MINERALS CH: 1.0000 | ORAL_TABLET | Freq: Every day | ORAL | 0 refills | Status: DC

## 2021-08-05 MED ORDER — ASPIRIN 81 MG PO CHEW
81.0000 mg | CHEWABLE_TABLET | Freq: Every day | ORAL | Status: DC
  Administered 2021-08-06 – 2021-09-10 (×36): 81 mg via ORAL
  Filled 2021-08-05 (×36): qty 1

## 2021-08-05 MED ORDER — FOLIC ACID 1 MG PO TABS
1.0000 mg | ORAL_TABLET | Freq: Every day | ORAL | Status: DC
  Administered 2021-08-06 – 2021-09-02 (×28): 1 mg via ORAL
  Filled 2021-08-05 (×28): qty 1

## 2021-08-05 MED ORDER — POLYETHYLENE GLYCOL 3350 17 G PO PACK: 17.0000 g | PACK | Freq: Every day | ORAL | 0 refills | Status: DC

## 2021-08-05 MED ORDER — ASPIRIN 81 MG PO CHEW: 81.0000 mg | CHEWABLE_TABLET | Freq: Every day | ORAL | 0 refills | Status: DC

## 2021-08-05 NOTE — Progress Notes (Signed)
Physical Therapy Treatment Patient Details Name: Roy [name] MRN: [id number] DOB: [birth year] Today's Date: 08/05/2021   History of Present Illness Pt is 25 yo male who presents seizures while smoking marijuana and drinking EtOH with girlfriend at home. 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 MRI - Acute infarcts involving the right thalamus, medial aspect of the  right occipital and temporal lobes, including right hippocampus,  centrally in the midbrain, on the left side of the pons and in the  bilateral cerebellar hemispheres. No significant PMH.    PT Comments    Patient progressing with mobility able to stand with cues, time and L LE placement with min to mod A.  Still apraxic and ataxic limiting ability to take more than 1 step forward with RW (which was stabilized and assist for L UE on walker).  Patient drooling at times when fatigued and distracted working other parts of his body.  He is working to stay positive and motivated singing and asking very appropriate questions.  Continue to feel he is excellent candidate for acute inpatient rehab.     Recommendations for follow up therapy are one component of a multi-disciplinary discharge planning process, led by the attending physician.  Recommendations may be updated based on patient status, additional functional criteria and insurance authorization.  Follow Up Recommendations  Acute inpatient rehab (3hours/day)     Assistance Recommended at Discharge Frequent or constant Supervision/Assistance  Equipment Recommendations  Other (comment) (TBA)    Recommendations for Other Services       Precautions / Restrictions Precautions Precautions: Fall Precaution Comments: permissive HTN <220/120, R hemi, diplopia/visual deficits     Mobility  Bed Mobility Overal bed mobility: Needs Assistance Bed Mobility:  Rolling;Sidelying to Sit;Sit to Supine Rolling: Mod assist Sidelying to sit: Mod assist;HOB elevated   Sit to supine: Mod assist   General bed mobility comments: assist for reaching with L UE to rail and bringing L shoulder forward to roll, assist to guide legs off bed and lift trunk; tp supine assist for legs and pt able to scoot up to [facility] when I held his legs to keep from sliding and bed in trendelenberg    Transfers Overall transfer level: Needs assistance Equipment used: 1 person hand held assist;Rolling walker (2 wheels) Transfers: Sit to/from Stand Sit to Stand: Mod assist           General transfer comment: A for L foot placement, anterior weight shift and pt able to slowly lift up off surface, assist for finishing upright to bring hips over knees and chest out; performed first with L HHA, then x 3 using walker; HR up to 120's with activity    Ambulation/Gait             Pre-gait activities: stepping forward L foot then R then prior to stepping back assisted to sit on EOB due to posterior balance and HR up to 120's     Stairs             Wheelchair Mobility    Modified Rankin (Stroke Patients Only) Modified Rankin (Stroke Patients Only) Pre-Morbid Rankin Score: No symptoms Modified Rankin: Severe disability     Balance Overall balance assessment: Needs assistance   Sitting balance-Leahy Scale: Poor Sitting balance - Comments: needs assist for placement for safety prior to sitting with S, at times leaning forward and L arm dangling   Standing balance support:  Bilateral upper extremity supported Standing balance-Leahy Scale: Poor Standing balance comment: UE support and min a for balance                            Cognition Arousal/Alertness: Awake/alert Behavior During Therapy: WFL for tasks assessed/performed Overall Cognitive Status: Impaired/Different from baseline Area of Impairment: Attention;Safety/judgement;Awareness;Problem  solving                   Current Attention Level: Selective   Following Commands: Follows one step commands consistently;Follows one step commands with increased time Safety/Judgement: Decreased awareness of safety;Decreased awareness of deficits Awareness: Emergent Problem Solving: Slow processing          Exercises Other Exercises Other Exercises: PNF patterns with BUE in supine and sitting Other Exercises: rythmic stabilization of trunk in sitting Other Exercises: repetitive rolling to R side with untilmate min A with facilitation Other Exercises: repetitive sidelying to sit with pushing up through weak RUE with min A    General Comments General comments (skin integrity, edema, etc.): Patient singing, "I can see clearly now, the rain is gone..."  Reports thinking positively and asking if he will get better.  Knows he is going to rehab today and encouraged that he will improve faster with more opportunities to move and figure out his body and how to compensate      Pertinent Vitals/Pain Pain Assessment: No/denies pain    Home Living                          Prior Function            PT Goals (current goals can now be found in the care plan section) Progress towards PT goals: Progressing toward goals    Frequency    Min 4X/week      PT Plan Current plan remains appropriate    Co-evaluation              AM-PAC PT "6 Clicks" Mobility   Outcome Measure  Help needed turning from your back to your side while in a flat bed without using bedrails?: A Lot Help needed moving from lying on your back to sitting on the side of a flat bed without using bedrails?: A Lot Help needed moving to and from a bed to a chair (including a wheelchair)?: A Lot Help needed standing up from a chair using your arms (e.g., wheelchair or bedside chair)?: A Lot Help needed to walk in hospital room?: Total Help needed climbing 3-5 steps with a railing? : Total 6  Click Score: 10    End of Session Equipment Utilized During Treatment: Gait belt Activity Tolerance: Patient tolerated treatment well Patient left: in bed;with call bell/phone within reach;with bed alarm set   PT Visit Diagnosis: Other abnormalities of gait and mobility (R26.89);Hemiplegia and hemiparesis;Ataxic gait (R26.0);Apraxia (R48.2);Other symptoms and signs involving the nervous system (R29.898)     Time: 1120-1150 PT Time Calculation (min) (ACUTE ONLY): 30 min  Charges:  $Therapeutic Activity: 8-22 mins $Neuromuscular Re-education: 8-22 mins                     Sheran Lawless, PT Acute Rehabilitation Services Pager:[phone] Office:[phone] 08/05/2021    Elray Mcgregor 08/05/2021, 1:11 PM

## 2021-08-05 NOTE — Discharge Summary (Addendum)
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   Active Problems:   HLD   Tobacco abuse   Dysphagia    Leukocytosis   Morbid obesity    Alcohol abuse   THC abuse   Depression    Medications to be continued on Rehab Allergies as of 08/05/2021   No Known Allergies      Medication List     TAKE these medications    acetaminophen 325 MG tablet Commonly known as: TYLENOL Take 2 tablets (650 mg total) by mouth every 4 (four) hours as needed for mild pain (or temp > 37.5 C (99.5 F)).   aspirin 81 MG chewable tablet Chew 1 tablet (81 mg total) by mouth daily. Start taking on: August 06, 2021   atorvastatin 40 MG tablet Commonly known as: LIPITOR Take 1 tablet (40 mg total) by mouth at bedtime.   docusate sodium 100 MG capsule Commonly known as: COLACE Take 1 capsule (100 mg total) by mouth 2 (two) times daily.   folic acid 1 MG tablet Commonly known as: FOLVITE Take 1 tablet (1 mg total) by mouth daily. Start taking on: August 06, 2021   insulin aspart 100 UNIT/ML injection Commonly known as: novoLOG Inject 0-15 Units into the skin 3 (three) times daily with meals.   melatonin 3 MG Tabs tablet Take 1 tablet (3 mg total) by mouth at bedtime.   multivitamin with minerals Tabs tablet Take 1 tablet by mouth daily. Start taking on: August 06, 2021   polyethylene glycol 17 g packet Commonly known as: MIRALAX / GLYCOLAX Take 17 g by mouth daily. Start taking on: August 06, 2021   QUEtiapine 50 MG Tb24 24 hr tablet Commonly known as: SEROQUEL XR Take 1 tablet  (50 mg total) by mouth at bedtime.   sertraline 100 MG tablet Commonly known as: ZOLOFT Take 1 tablet (100 mg total) by mouth daily. Start taking on: August 06, 2021   thiamine 100 MG tablet Take 1 tablet (100 mg total) by mouth daily. Start taking on: August 06, 2021   ticagrelor 90 MG Tabs tablet Commonly known as: BRILINTA Take 1 tablet (90 mg total) by mouth 2 (two) times daily.        LABORATORY STUDIES CBC    Component Value Date/Time   WBC 12.0 (H) 08/05/2021 0215   RBC 4.23 08/05/2021 0215   HGB 12.6 (L) 08/05/2021 0215   HCT 38.0 (L) 08/05/2021 0215   PLT 502 (H) 08/05/2021 0215   MCV 89.8 08/05/2021 0215   MCH 29.8 08/05/2021 0215   MCHC 33.2 08/05/2021 0215   RDW 12.8 08/05/2021 0215   LYMPHSABS 2.2 07/29/2021 0647   MONOABS 1.2 (H) 07/29/2021 0647   EOSABS 0.1 07/29/2021 0647   BASOSABS 0.0 07/29/2021 0647   CMP    Component Value Date/Time   NA 132 (L) 08/05/2021 0215   K 4.2 08/05/2021 0215   CL 98 08/05/2021 0215   CO2 22 08/05/2021 0215   GLUCOSE 98 08/05/2021 0215   BUN 17 08/05/2021 0215   CREATININE  0.88 08/05/2021 0215   CALCIUM 9.5 08/05/2021 0215   PROT 6.7 07/27/2021 0031   ALBUMIN 3.6 07/27/2021 0031   AST 38 07/27/2021 0031   ALT 38 07/27/2021 0031   ALKPHOS 59 07/27/2021 0031   BILITOT 0.6 07/27/2021 0031   GFRNONAA >60 08/05/2021 0215   COAGSNo results found for: INR, PROTIME Lipid Panel    Component Value Date/Time   CHOL 199 07/28/2021 0519   TRIG 230 (H) 07/28/2021 0519   HDL 37 (L) 07/28/2021 0519   CHOLHDL 5.4 07/28/2021 0519   VLDL 46 (H) 07/28/2021 0519   LDLCALC 116 (H) 07/28/2021 0519   HgbA1C  Lab Results  Component Value Date   HGBA1C 5.7 (H) 07/27/2021   Urinalysis    Component Value Date/Time   COLORURINE AMBER (A) 08/03/2021 1654   APPEARANCEUR HAZY (A) 08/03/2021 1654   LABSPEC 1.035 (H) 08/03/2021 1654   PHURINE 5.0 08/03/2021 1654   GLUCOSEU NEGATIVE 08/03/2021 1654   HGBUR NEGATIVE  08/03/2021 1654   BILIRUBINUR NEGATIVE 08/03/2021 1654   KETONESUR NEGATIVE 08/03/2021 1654   PROTEINUR NEGATIVE 08/03/2021 1654   NITRITE NEGATIVE 08/03/2021 1654   LEUKOCYTESUR NEGATIVE 08/03/2021 1654   Urine Drug Screen     Component Value Date/Time   LABOPIA NONE DETECTED 07/27/2021 0606   COCAINSCRNUR NONE DETECTED 07/27/2021 0606   LABBENZ NONE DETECTED 07/27/2021 0606   AMPHETMU NONE DETECTED 07/27/2021 0606   THCU POSITIVE (A) 07/27/2021 0606   LABBARB NONE DETECTED 07/27/2021 0606    Alcohol Level    Component Value Date/Time   ETH <10 07/27/2021 0031     SIGNIFICANT DIAGNOSTIC STUDIES CT ANGIO HEAD W OR WO CONTRAST  Result Date: 07/27/2021 CLINICAL DATA:  Initial evaluation for acute altered mental status, evaluate for basilar thrombus. EXAM: CT ANGIOGRAPHY HEAD AND NECK WITH CONTRAST CT VENOGRAM HEAD WITH CONTRAST TECHNIQUE: Multidetector CT imaging of the head and neck was performed using the standard protocol during bolus administration of intravenous contrast. Multiplanar CT image reconstructions and MIPs were obtained to evaluate the vascular anatomy. Carotid stenosis measurements (when applicable) are obtained utilizing NASCET criteria, using the distal internal carotid diameter as the denominator. CONTRAST:  60mL OMNIPAQUE IOHEXOL 350 MG/ML SOLN COMPARISON:  Prior noncontrast head CT from 07/26/2021./ FINDINGS: CTA NECK FINDINGS Aortic arch: Visualized aortic arch normal in caliber with normal branch pattern. No stenosis or other abnormality about the origin of the great vessels. Right carotid system: Right common and internal carotid arteries widely patent without stenosis, dissection or occlusion. Left carotid system: Left common and internal carotid arteries widely patent without stenosis, dissection or occlusion. Vertebral arteries: Both vertebral arteries arise from the subclavian arteries. No proximal subclavian artery stenosis. Right vertebral artery widely patent  within the neck without stenosis or other abnormality. On the left, there is intimal irregularity with intraluminal filling defect involving the proximal left V1 segment, concerning for acute dissection (series 7, image 130). Area of involvement begins at or just beyond the origin, and extends approximately 2.3 cm in length. Associated moderate to severe stenosis proximally (series 5, image 271). Distally, the left vertebral artery is otherwise widely patent to the skull base. Skeleton: No visible acute osseous finding. No discrete or worrisome osseous lesions. Other neck: No other acute soft tissue abnormality within the neck. Endotracheal and enteric tubes in place. Upper chest: Mild atelectatic changes noted dependently within the visualized lungs. Visualized upper chest demonstrates no other acute finding. Review of the MIP images confirms the above findings CTA HEAD  FINDINGS Anterior circulation: Both internal carotid arteries widely patent to the termini without stenosis. A1 segments widely patent. Normal anterior communicating artery complex. Both anterior cerebral arteries widely patent to their distal aspects without stenosis. No M1 stenosis or occlusion. Normal MCA bifurcations. Distal MCA branches well perfused and symmetric. Posterior circulation: Both V4 segments patent to the vertebrobasilar junction without stenosis or other abnormality. Both PICA origins patent and normal. Basilar widely patent proximally. There is some a subtle irregular filling defect involving the distal basilar artery/basilar tip, concerning for intraluminal thrombus (series 7, image 128). Secondary mild to moderate narrowing at the basilar tip. Neither superior cerebellar artery well visualized at the origin., and could be occluded. Irregular partially occlusive filling defect seen involving the left P1 segment with resultant moderate to severe stenosis (series 7, image 129). Left PCA otherwise widely patent distally. On the  right, there is acute occlusion at the distal right P2 segment (series 7, image 141). Perfusion within right PCA branches distally could be related to subocclusive thrombus and/or collateralization. Venous sinuses: Dedicated venogram images were performed. Normal enhancement seen throughout the superior sagittal sinus to the level of the torcula. Transverse and sigmoid sinuses are patent bilaterally. Proximal internal jugular veins patent. Straight sinus, vein of Galen, internal cerebral veins, and basal veins of Rosenthal are patent. No visible abnormality about the cavernous sinus. Superior orbital veins grossly symmetric and within normal limits. No evidence for dural sinus thrombosis. No appreciable cortical venous thrombosis. Anatomic variants: None significant.  No aneurysm. Review of the MIP images confirms the above findings IMPRESSION: 1. 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 2. Subtle irregular nonocclusive filling defect involving the distal basilar artery/basilar tip, consistent with associated distal embolic disease. Adjacent superior cerebellar arteries are not visualized at their origins, and could be occluded. 3. Associated partially occlusive thrombus with severe stenosis involving the left P1 segment. 4. Additional distal right P2 embolic occlusion. 5. Wide patency of both carotid artery systems and anterior circulation. 6. Negative CT venogram.  No evidence for dural sinus thrombosis. Critical Value/emergent results were called by telephone at the time of interpretation on 07/27/2021 at 1:26 am to provider [facility] , who verbally acknowledged these results. Electronically Signed   By: Rise Mu M.D.   On: 07/27/2021 02:08   DG Pelvis 1-2 Views  Result Date: 07/27/2021 CLINICAL DATA:  Stroke EXAM: PELVIS - 1-2 VIEW COMPARISON:  CT angiography head 07/27/2021 FINDINGS:  Approximally 17 cm oval opacity overlying the pelvis likely represents a contrast filled urinary bladder due to recent intravenous contrast administration. Markedly limited evaluation due to overlapping osseous structures and overlying soft tissues. There is no evidence of pelvic fracture or diastasis. No pelvic bone lesions are seen. IMPRESSION: Urinary bladder lumen filled with excreted intravenous contrast. Electronically Signed   By: Tish Frederickson M.D.   On: 07/27/2021 15:14   CT HEAD WO CONTRAST ( )  Result Date: 07/27/2021 CLINICAL DATA:  Follow-up suspected intracranial hemorrhage EXAM: CT HEAD WITHOUT CONTRAST TECHNIQUE: Contiguous axial images were obtained from the base of the skull through the vertex without intravenous contrast. COMPARISON:  None. FINDINGS: Brain: No evidence of acute infarction, hemorrhage, hydrocephalus, extra-axial collection or mass lesion/mass effect. Vascular: No hyperdense vessel or unexpected calcification. Skull: Normal. Negative for fracture or focal lesion. Sinuses/Orbits: No acute finding. Other: None. IMPRESSION: No acute intracranial pathology. No evidence of intracranial hemorrhage. Electronically Signed   By: Jearld Lesch  M.D.   On: 07/27/2021 09:03   CT Head Wo Contrast  Result Date: 07/26/2021 CLINICAL DATA:  Seizure. EXAM: CT HEAD WITHOUT CONTRAST TECHNIQUE: Contiguous axial images were obtained from the base of the skull through the vertex without intravenous contrast. COMPARISON:  None. FINDINGS: Brain: No evidence of acute infarction, hemorrhage, hydrocephalus, extra-axial collection or mass lesion/mass effect. Vascular: No hyperdense vessel or unexpected calcification. Skull: Normal. Negative for fracture or focal lesion. Sinuses/Orbits: There is mucoperiosteal thickening of paranasal sinuses and complete opacification of the nasal passages. No air-fluid. The mastoid air cells are clear. Other: None IMPRESSION: 1. Normal noncontrast CT of the brain. 2.  Paranasal sinus disease. Electronically Signed   By: Elgie Collard M.D.   On: 07/26/2021 23:51   CT ANGIO NECK W OR WO CONTRAST  Result Date: 07/27/2021 CLINICAL DATA:  Initial evaluation for acute altered mental status, evaluate for basilar thrombus. EXAM: CT ANGIOGRAPHY HEAD AND NECK WITH CONTRAST CT VENOGRAM HEAD WITH CONTRAST TECHNIQUE: Multidetector CT imaging of the head and neck was performed using the standard protocol during bolus administration of intravenous contrast. Multiplanar CT image reconstructions and MIPs were obtained to evaluate the vascular anatomy. Carotid stenosis measurements (when applicable) are obtained utilizing NASCET criteria, using the distal internal carotid diameter as the denominator. CONTRAST:  60mL OMNIPAQUE IOHEXOL 350 MG/ML SOLN COMPARISON:  Prior noncontrast head CT from 07/26/2021./ FINDINGS: CTA NECK FINDINGS Aortic arch: Visualized aortic arch normal in caliber with normal branch pattern. No stenosis or other abnormality about the origin of the great vessels. Right carotid system: Right common and internal carotid arteries widely patent without stenosis, dissection or occlusion. Left carotid system: Left common and internal carotid arteries widely patent without stenosis, dissection or occlusion. Vertebral arteries: Both vertebral arteries arise from the subclavian arteries. No proximal subclavian artery stenosis. Right vertebral artery widely patent within the neck without stenosis or other abnormality. On the left, there is intimal irregularity with intraluminal filling defect involving the proximal left V1 segment, concerning for acute dissection (series 7, image 130). Area of involvement begins at or just beyond the origin, and extends approximately 2.3 cm in length. Associated moderate to severe stenosis proximally (series 5, image 271). Distally, the left vertebral artery is otherwise widely patent to the skull base. Skeleton: No visible acute osseous finding.  No discrete or worrisome osseous lesions. Other neck: No other acute soft tissue abnormality within the neck. Endotracheal and enteric tubes in place. Upper chest: Mild atelectatic changes noted dependently within the visualized lungs. Visualized upper chest demonstrates no other acute finding. Review of the MIP images confirms the above findings CTA HEAD FINDINGS Anterior circulation: Both internal carotid arteries widely patent to the termini without stenosis. A1 segments widely patent. Normal anterior communicating artery complex. Both anterior cerebral arteries widely patent to their distal aspects without stenosis. No M1 stenosis or occlusion. Normal MCA bifurcations. Distal MCA branches well perfused and symmetric. Posterior circulation: Both V4 segments patent to the vertebrobasilar junction without stenosis or other abnormality. Both PICA origins patent and normal. Basilar widely patent proximally. There is some a subtle irregular filling defect involving the distal basilar artery/basilar tip, concerning for intraluminal thrombus (series 7, image 128). Secondary mild to moderate narrowing at the basilar tip. Neither superior cerebellar artery well visualized at the origin., and could be occluded. Irregular partially occlusive filling defect seen involving the left P1 segment with resultant moderate to severe stenosis (series 7, image 129). Left PCA otherwise widely patent distally. On the right, there  is acute occlusion at the distal right P2 segment (series 7, image 141). Perfusion within right PCA branches distally could be related to subocclusive thrombus and/or collateralization. Venous sinuses: Dedicated venogram images were performed. Normal enhancement seen throughout the superior sagittal sinus to the level of the torcula. Transverse and sigmoid sinuses are patent bilaterally. Proximal internal jugular veins patent. Straight sinus, vein of Galen, internal cerebral veins, and basal veins of Rosenthal  are patent. No visible abnormality about the cavernous sinus. Superior orbital veins grossly symmetric and within normal limits. No evidence for dural sinus thrombosis. No appreciable cortical venous thrombosis. Anatomic variants: None significant.  No aneurysm. Review of the MIP images confirms the above findings IMPRESSION: 1. 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 2. Subtle irregular nonocclusive filling defect involving the distal basilar artery/basilar tip, consistent with associated distal embolic disease. Adjacent superior cerebellar arteries are not visualized at their origins, and could be occluded. 3. Associated partially occlusive thrombus with severe stenosis involving the left P1 segment. 4. Additional distal right P2 embolic occlusion. 5. Wide patency of both carotid artery systems and anterior circulation. 6. Negative CT venogram.  No evidence for dural sinus thrombosis. Critical Value/emergent results were called by telephone at the time of interpretation on 07/27/2021 at 1:26 am to provider [facility] , who verbally acknowledged these results. Electronically Signed   By: Rise Mu M.D.   On: 07/27/2021 02:08   MR BRAIN WO CONTRAST  Result Date: 07/27/2021 CLINICAL DATA:  Neuro deficit, acute, stroke suspected. Status post mechanical thrombectomy. EXAM: MRI HEAD WITHOUT CONTRAST TECHNIQUE: Multiplanar, multiecho pulse sequences of the brain and surrounding structures were obtained without intravenous contrast. COMPARISON:  Head CT July 27, 2021. FINDINGS: Brain: Scattered areas of restricted diffusion are seen involving the right thalamus, medial aspect of the right occipital and temporal lobes, including right hippocampus, centrally in the midbrain, on the left side of the pons and in the bilateral cerebellar hemispheres consistent with acute infarcts. Petechial hemorrhage  seen in the left inferior cerebellar hemisphere and right occipital lobe. No hydrocephalus, extra-axial collection or mass lesion. Vascular: Normal flow voids. Skull and upper cervical spine: Normal marrow signal. Sinuses/Orbits: Mucosal thickening throughout the paranasal sinuses with fluid level within the sphenoid sinuses. The orbits are maintained. Other: Presence of endotracheal tube. IMPRESSION: Acute infarcts involving the right thalamus, medial aspect of the right occipital and temporal lobes, including right hippocampus, centrally in the midbrain, on the left side of the pons and in the bilateral cerebellar hemispheres. Petechial hemorrhage seen in the left inferior cerebellar hemisphere right occipital lobe. No significant mass effect. Electronically Signed   By: Baldemar Lenis M.D.   On: 07/27/2021 16:40   IR CT Head Ltd  Result Date: 07/28/2021 INDICATION: Onset of seizures, decreased level of consciousness. 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 EMERGENT LARGE VESSEL OCCLUSION THROMBOLYSIS (POSTERIOR CIRCULATION) COMPARISON:  CT angiogram of the head and neck July 27, 2021. MEDICATIONS: Ancef 2 g IV antibiotic was administered within 1 hour of the procedure. ANESTHESIA/SEDATION: General anesthesia. CONTRAST:  Omnipaque 300 proximally 130 mL. FLUOROSCOPY TIME:  Fluoroscopy Time: 72 minutes 42 seconds (4188 mGy). COMPLICATIONS: None immediate. TECHNIQUE: Following a full explanation of the procedure along with the potential associated complications, an informed witnessed consent  was obtained. The risks of intracranial hemorrhage of 10%, worsening neurological deficit, ventilator dependency, death and inability to revascularize were all reviewed in detail with the patient's sister. The patient was then  put under general anesthesia by the Department of Anesthesiology at [facility]. The right groin was prepped and draped in the usual sterile fashion. Thereafter using modified Seldinger technique, transfemoral access into the right common femoral artery was obtained without difficulty. Over a 0.035 inch guidewire an 8 [place] Pinnacle 25 cm sheath was inserted. Through this, and also over a 0.035 inch guidewire a 5 [place] JB 1 catheter was advanced to the aortic arch region and selectively positioned in the right common carotid artery, the right subclavian artery, the left vertebral artery. FINDINGS: The right common carotid arteriogram demonstrates the right external carotid artery and its major branches to be widely patent. The right internal carotid artery at the bulb to the cranial skull base is widely patent. There is a mild to moderate stenosis at the cervical petrous junction. More distally, the petrous, the cavernous and the supraclinoid segments are widely patent. The right middle cerebral artery and the right anterior cerebral artery opacify into the capillary and venous phases. Transient cross-filling via the anterior communicating artery of the left anterior cerebral A2 segment and distally is seen. The innominate arteriogram demonstrates the right common carotid artery proximally, and the right subclavian artery proximally to be widely patent. The right vertebral artery is seen to be patent with opacification made to the cranial skull base. The left vertebral artery arteriogram demonstrates abnormal appearance in the proximal 1/3 starting at the origin. There is a tapered irregularity associated with severe stenosis and a prominent smooth filling defect. More distally, the vessel opacifies normally to the cranial skull base. Patency is seen of the left vertebrobasilar junction and the left posterior-inferior cerebellar artery. Prominent filling defects are seen in the distal basilar artery, and  the left posterior cerebral artery at its origin, and the right posterior cerebral artery in the distal P1 segment. PROCEDURE: The diagnostic JB 1 catheter in the distal right subclavian artery was then exchanged over a 0.035 inch 300 cm Rosen exchange guidewire for a 90 cm 8 [place] Neuron Max sheath, which was advanced to the distal right subclavian artery. The guidewire was removed. Good aspiration obtained from the hub of the Neuron Max sheath. A control arteriogram performed through the Neuron Max sheath after it had been withdrawn just proximal to the origin of the right vertebral artery demonstrates no evidence of spasms, dissections or of intraluminal filling defects. Over a 0.035 Roadrunner guidewire, a diagnostic JB 1 catheter was advanced within the Neuron Max sheath to its distal end. The vertebral artery was then selected with the guidewire followed by the 5 French diagnostic catheter which, the 8 [place] Neuron Max sheath was advanced to the distal cervical right ICA. The guidewire, and the support catheter were removed. Good aspiration obtained from the hub of the Neuron Max sheath. A gentle control arteriogram performed through this demonstrated no evidence of spasms, dissections or of intraluminal filling defects. The opacified right vertebral artery distally, the right vertebrobasilar junction and the right posterior-inferior cerebellar artery demonstrate wide patency. Cross-filling of the left vertebrobasilar junction was noted. The basilar artery and its proximal 2/3, the anterior-inferior cerebellar arteries demonstrate uniform enhancement. However, prominent pre occlusive filling defects noted in the distal 1/3 of the basilar artery extending into the right posterior cerebral artery distal P1 segment, and the origin of  the left posterior cerebral artery. ENDOVASCULAR REVASCULARIZATION OF DISTAL BASILAR ARTERY THROMBOSIS, AND OF THE POSTERIOR CEREBRAL ARTERIES AND OF THE SYMPTOMATIC DISSECTED  PROXIMAL LEFT VERTEBRAL ARTERY EXTENDING TO THE ORIGIN. Over a 0.014 inch standard Synchro micro guidewire with a moderate J configuration, the combination of a 162 cm 021 microcatheter inside of an 071 132 cm Zoom aspiration catheter combination was advanced to the right vertebrobasilar junction. The micro guidewire was then gently advanced more distally into the distal basilar artery initially and then into the left posterior cerebral artery distal P2 segment. The guidewire was removed. Good aspiration obtained from the hub of the microcatheter. Gentle control arteriogram performed through the microcatheter demonstrated safe positioning of the tip of the microcatheter. This was then connected to continuous heparinized saline infusion. At this time, a 4 mm x 40 mm Solitaire X retrieval device was advanced to the distal end of the microcatheter. The O ring on the delivery microcatheter was loosened. With slight forward gentle traction with the right hand on the delivery micro guidewire, with the left hand the delivery microcatheter was unsheathed deploying the retrieval device. The proximal portion of the device was seen in the distal basilar artery. Thereafter, with constant aspiration with a Penumbra aspiration device at the hub of the 071 Zoom aspiration catheter and a 60 mL syringe at the hub of the Neuron Max sheath for approximately 3-1/2 minutes, the combination of the retrieval device, the 071 Zoom aspiration catheter, and the microcatheter were retrieved and removed. A control arteriogram performed through the Neuron Max sheath in the distal right vertebral artery demonstrated near complete revascularization of the distal basilar artery and the posterior cerebral arteries. A small filling defect was noted in the distal left superior cerebellar artery. This was deemed too distal and small for endovascular retrieval. A TICI 2C reperfusion was achieved. The sheath was then retrieved and positioned at the  origin of the left vertebral artery. A control arteriogram performed through the Neuron Max sheath just proximal to the origin of the left vertebral artery continued to demonstrate sequela of a long segment dissection associated with an intimal flap, and a mural hematoma. Associated significant narrowing was noted. However, more distally, the contrast was seen to the cranial skull base into the left vertebrobasilar junction and the left posterior-inferior cerebellar artery. 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 A 2.4 mm x 5 mm Emboshield device was then prepped and purged of air in its housing. The device was then retrieved into the delivery microcatheter. The combination of the 014 inch and the delivery microcatheter was retrieved from the housing. A J configuration was given to the tip of the micro guidewire. Under constant roadmap technique and constant fluoroscopic guidance, the combination of the Emboshield delivery catheter, and the micro guidewire was advanced to just proximal to the origin of the left vertebral artery. Using a torque device, access with the micro guidewire was first obtained without contact with the underlying dissected segment. The combination was then advanced without difficulty to the cranial skull base to the level of C1-C2. In the usual manner, the filter device was then deployed. The delivery microcatheter was retrieved and removed while maintaining constant positioning of tip of the micro guidewire. Measurements were then performed of the proximal left vertebral artery in its most normal segment. The left dissection was also measured. It was decided to proceed with placement of a 3.5 mm x 34 mm Onyx Frontier balloon mountable stent. This was prepped with 75%  contrast and 25% heparinized saline infusion antegradely, and with just heparinized saline infusion retrogradely. Thereafter using the rapid exchange technique, the combination was advanced without difficulty  to the proximal left vertebral artery. The proximal and the distal landing zones were then identified. The stent was then deployed by inflating the balloon mountable stent using micro inflation syringe device via micro tubing to just over 3 mm where it was maintained for approximately 15 seconds. Thereafter, the balloon was deflated and removed. A control arteriogram performed through Neuron Max sheath just proximal to the origin of the left vertebral artery now demonstrated excellent apposition of the stent with wide patency of the left vertebral artery proximally. More distally, this contrast flow was noted into the left vertebrobasilar junction, the basilar artery and the just revascularized posterior cerebral arteries. No evidence of intraluminal filling defects was seen extra cranially or intracranially. The filter capture device was then advanced again using the rapid exchange technique to just the proximal marker on the device. The filter device was then captured into the capture device by retrieving on the micro guidewire. The combination was then retrieved slowly under constant fluoroscopic guidance ensuring no entanglement with the planted stent in the proximal left vertebral artery. Control arteriogram was then performed at 10 and 20 minutes post deployment of the stent. These continued to demonstrate excellent flow in the proximal left vertebral artery. Intracranially the TICI 2C revascularization was maintained. Following this, the Neuron Max sheath was then retrieved and removed. The 8 French sheath was removed with hemostasis achieved at the right groin puncture site with a 6 French Angio-Seal closure device. Distal pulses remained palpable in both feet unchanged. A CT of the brain obtained demonstrated no evidence of intracranial hemorrhage or hydrocephalus. Prior to planting the stent at the second site in the proximal left vertebral artery, patient was given 81 mg of aspirin, and 180 mg of Brilinta  via an orogastric tube. Also right after stent placement, patient was given a bolus dose of cangrelor followed by a low-dose four hour infusion. Patient was left intubated on account of a pre procedural clinical condition. He was then transferred to the neuro ICU for post revascularization measures. IMPRESSION: Status post endovascular revascularization of occluded distal basilar artery and both posterior cerebral arteries as described above with a 4 mm x 4 mm Solitaire X retrieval device and contact aspiration achieving a TICI score of 2C. 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 PLAN: Follow-up in the clinic 2 weeks post discharge. Electronically Signed   By: Julieanne Cotton M.D.   On: 07/28/2021 08:14   DG CHEST PORT 1 VIEW  Result Date: 08/03/2021 CLINICAL DATA:  Seizure, possible infection EXAM: PORTABLE CHEST 1 VIEW COMPARISON:  None. FINDINGS: The heart size and mediastinal contours are within normal limits. Both lungs are clear. No pleural effusion. The visualized skeletal structures are unremarkable. IMPRESSION: No acute process in the chest. Electronically Signed   By: Guadlupe Spanish M.D.   On: 08/03/2021 18:10   DG Chest Port 1 View  Result Date: 07/27/2021 CLINICAL DATA:  Encounter for intubation EXAM: PORTABLE CHEST 1 VIEW COMPARISON:  Chest radiograph 07/26/2021 FINDINGS: Endotracheal tube overlies the midthoracic trachea. Enteric tube tip and side port overlie the stomach. There is a new left neck stent. Unchanged cardiomediastinal silhouette. No focal airspace consolidation. No large pleural effusion or visible pneumothorax. There is no acute osseous abnormality. IMPRESSION: Endotracheal tube  overlies the midthoracic trachea. No new airspace disease. Electronically Signed   By: Caprice Renshaw M.D.   On: 07/27/2021 08:57   DG Chest Portable 1 View  Result Date:  07/26/2021 CLINICAL DATA:  Status post intubation. EXAM: PORTABLE CHEST 1 VIEW COMPARISON:  None. FINDINGS: Endotracheal tube with tip approximately 2 cm above the carina. Enteric tube extends below the diaphragm with tip beyond the inferior margin of the image. Mild cardiomegaly with mild central vascular congestion. No focal consolidation, pleural effusion or pneumothorax. No acute osseous pathology. IMPRESSION: 1. Endotracheal tube above the carina. 2. Mild cardiomegaly with mild central vascular congestion. No focal consolidation. Electronically Signed   By: Elgie Collard M.D.   On: 07/26/2021 23:06   DG Abd Portable 1V  Result Date: 07/28/2021 CLINICAL DATA:  Evaluate feeding tube placement. EXAM: PORTABLE ABDOMEN - 1 VIEW COMPARISON:  07/27/2021 FINDINGS: Feeding tube has been placed with tip well below the GE junction. The tip projects over the expected location of the gastric antrum or pylorus. Gaseous distension of the colon is again noted. IMPRESSION: Feeding tube tip projects over the gastric antrum or pylorus. Electronically Signed   By: Signa Kell M.D.   On: 07/28/2021 15:46   DG Abd Portable 1V  Result Date: 07/27/2021 CLINICAL DATA:  NG tube placement EXAM: PORTABLE ABDOMEN - 1 VIEW COMPARISON:  None. FINDINGS: Endotracheal tube overlies the midthoracic trachea. Nasogastric tube side port overlies the body of the stomach, tip overlying the distal stomach. There is gas distention of bowel in the abdomen. IMPRESSION: Nasogastric tube tip and side port overlie the stomach. Gaseous distension of bowel in the abdomen. Electronically Signed   By: Caprice Renshaw M.D.   On: 07/27/2021 09:50   DG Swallowing Func-Speech Pathology  Result Date: 07/29/2021 Table formatting from the original result was not included. Objective Swallowing Evaluation: Type of Study: MBS-Modified Barium Swallow Study  Patient Details Name: Roy [name] MRN: [id number] Date of Birth: 11-Feb-1996 Today's Date:  07/29/2021 Time: SLP Start Time (ACUTE ONLY): 1330 -SLP Stop Time (ACUTE ONLY): 1346 SLP Time Calculation (min) (ACUTE ONLY): 16 min Past Medical History: No past medical history on file. Past Surgical History: Past Surgical History: Procedure Laterality Date  IR ANGIO EXTRACRAN SEL COM CAROTID INNOMINATE UNI R MOD SED  07/27/2021  IR CT HEAD LTD  07/27/2021  IR INTRA CRAN STENT  07/27/2021  IR PERCUTANEOUS ART THROMBECTOMY/INFUSION INTRACRANIAL INC DIAG ANGIO  07/27/2021  RADIOLOGY WITH ANESTHESIA N/A 07/27/2021  Procedure: IR WITH ANESTHESIA;  Surgeon: Julieanne Cotton, MD;  Location: [facility] OR;  Service: Radiology;  Laterality: N/A; HPI: 25 y.o M presenting with acute onset seizures after having significant headaches for three days and intake of alcohol and marajuana. Imaging revealed Acute infarcts involving the right thalamus, medial aspect of the  right occipital and temporal lobes, including right hippocampus,  centrally in the midbrain, on the left side of the pons and in the  bilateral cerebellar hemispheres. Petechial hemorrhage seen in the  left inferior cerebellar hemisphere right occipital lobe. Intubated 11/7-11/8  Subjective: Pt awake and alert, cheerfully greeting each member of the careteam  Recommendations for follow up therapy are one component of a multi-disciplinary discharge planning process, led by the attending physician.  Recommendations may be updated based on patient status, additional functional criteria and insurance authorization. Assessment / Plan / Recommendation Clinical Impressions 07/29/2021 Clinical Impression Pt was seen for an MBS. Overall, pt presents with mild oral dysphagia d/t neuromuscular deficits resulting in anterior oral  spillage and weak lingual manipulation. Pharyngeal phase WFL across all consistencies. Pt drooling from right side before test began and presents with mild lingual edema. With thin liquid from cup, pt exhibited mild anterior oral spillage from right side which  was mitigated with administration through straw. Pt exhibited no oral phase impairments with puree and only mildly prolonged mastication with regular solid. Pt also exhibited mild oral residue coating tongue across consistencies. With pill with thin liquid, pt exhibited anterior oral spillage of thin liquid and weak lingual manipulation, unable to propel pill to posterior oral cavity. Weak lingual manipulation also observed with pill administered in puree, resulting in pill expectoration. Pt care partner reports pt cannot take whole pills at baseline. Recommend regular/thin liquid diet, administering medications crushed in puree. SLP will continue to follow pt acutely for management of diet tolerance. SLP Visit Diagnosis Dysphagia, oral phase (R13.11) Attention and concentration deficit following -- Frontal lobe and executive function deficit following -- Impact on safety and function Mild aspiration risk   Treatment Recommendations 07/29/2021 Treatment Recommendations Therapy as outlined in treatment plan below   Prognosis 07/29/2021 Prognosis for Safe Diet Advancement Good Barriers to Reach Goals Severity of deficits Barriers/Prognosis Comment -- Diet Recommendations 07/29/2021 SLP Diet Recommendations Regular solids;Thin liquid Liquid Administration via Cup;Straw Medication Administration Crushed with puree Compensations Minimize environmental distractions;Slow rate Postural Changes Remain semi-upright after after feeds/meals (Comment);Seated upright at 90 degrees   Other Recommendations 07/29/2021 Recommended Consults -- Oral Care Recommendations Oral care BID Other Recommendations -- Follow Up Recommendations Acute inpatient rehab (3hours/day) Assistance recommended at discharge PRN Functional Status Assessment Patient has had a recent decline in their functional status and demonstrates the ability to make significant improvements in function in a reasonable and predictable amount of time. Frequency and  Duration  07/29/2021 Speech Therapy Frequency (ACUTE ONLY) min 2x/week Treatment Duration 2 weeks   Oral Phase 07/29/2021 Oral Phase Impaired Oral - Pudding Teaspoon -- Oral - Pudding Cup -- Oral - Honey Teaspoon -- Oral - Honey Cup -- Oral - Nectar Teaspoon -- Oral - Nectar Cup -- Oral - Nectar Straw -- Oral - Thin Teaspoon -- Oral - Thin Cup Right anterior bolus loss Oral - Thin Straw WFL Oral - Puree WFL Oral - Mech Soft -- Oral - Regular WFL Oral - Multi-Consistency -- Oral - Pill Weak lingual manipulation;Right anterior bolus loss Oral Phase - Comment --  Pharyngeal Phase 07/29/2021 Pharyngeal Phase WFL Pharyngeal- Pudding Teaspoon -- Pharyngeal -- Pharyngeal- Pudding Cup -- Pharyngeal -- Pharyngeal- Honey Teaspoon -- Pharyngeal -- Pharyngeal- Honey Cup -- Pharyngeal -- Pharyngeal- Nectar Teaspoon -- Pharyngeal -- Pharyngeal- Nectar Cup -- Pharyngeal -- Pharyngeal- Nectar Straw -- Pharyngeal -- Pharyngeal- Thin Teaspoon -- Pharyngeal -- Pharyngeal- Thin Cup -- Pharyngeal -- Pharyngeal- Thin Straw -- Pharyngeal -- Pharyngeal- Puree -- Pharyngeal -- Pharyngeal- Mechanical Soft -- Pharyngeal -- Pharyngeal- Regular -- Pharyngeal -- Pharyngeal- Multi-consistency -- Pharyngeal -- Pharyngeal- Pill -- Pharyngeal -- Pharyngeal Comment --  No flowsheet data found. Royce Macadamia 07/29/2021, 3:39 PM                     EEG adult  Result Date: 07/27/2021 Charlsie Quest, MD     07/27/2021  1:54 PM Patient Name: Roy [name] MRN: [id number] Epilepsy Attending: Charlsie Quest Referring Physician/Provider: Dr Delia Heady Date: 07/27/2021 Duration: 22.57 mins Patient history: 25 year old male with seizure-like episodes.  EEG to evaluate for seizure. Level of alertness: Lethargic AEDs during EEG study: Propofol Technical  aspects: This EEG study was done with scalp electrodes positioned according to the 10-20 International system of electrode placement. Electrical activity was acquired at a sampling rate of 500Hz   and reviewed with a high frequency filter of 70Hz  and a low frequency filter of 1Hz . EEG data were recorded continuously and digitally stored. Description: EEG showed an excessive amount of 15 to 18 Hz beta activity  distributed symmetrically and diffusely.   Hyperventilation and photic stimulation were not performed.   ABNORMALITY - Excessive beta, generalized IMPRESSION: This study is within normal limits. No seizures or epileptiform discharges were seen throughout the recording. The excessive beta activity seen in the background is most likely due to the effect of medications like benzodiazepine and is a benign EEG pattern. Priyanka   Overnight EEG with video  Result Date: 07/28/2021 , MD     07/28/2021  2:19 PM Patient Name: Roy [name] MRN: Charlsie Quest Epilepsy Attending: 13/05/2021 Referring Physician/Provider: Dr Billey Chang Duration: 07/27/2021 1058 to 07/28/2021 1027  Patient history: 25 year old male with seizure-like episodes.  EEG to evaluate for seizure.  Level of alertness: Lethargic  AEDs during EEG study: Propofol  Technical aspects: This EEG study was done with scalp electrodes positioned according to the 10-20 International system of electrode placement. Electrical activity was acquired at a sampling rate of 500Hz  and reviewed with a high frequency filter of 70Hz  and a low frequency filter of 1Hz . EEG data were recorded continuously and digitally stored.  Description: EEG initially showed an excessive amount of 15 to 18 Hz beta activity  distributed symmetrically and diffusely. Gradually, EEG showed posterior dominant rhythm of 9 Hz activity of moderate voltage (25-35 uV) seen predominantly in posterior head regions, symmetric and reactive to eye opening and eye closing.   Hyperventilation and photic stimulation were not performed.    ABNORMALITY - Excessive beta, generalized  IMPRESSION: This study is within normal limits. No seizures or epileptiform  discharges were seen throughout the recording.  The excessive beta activity seen in the background is most likely due to the effect of medications like benzodiazepine and is a benign EEG pattern.  13/04/2021   ECHOCARDIOGRAM COMPLETE  Result Date: 07/27/2021    ECHOCARDIOGRAM REPORT   Patient Name:   KAWHI DIEBOLD Date of Exam: 07/27/2021 Medical Rec #:        Height:       62.0 in Accession #:         Weight:       210.0 lb Date of Birth:  1995-09-26       BSA:          1.952 m Patient Age:    24 years        BP:           97/71 mmHg Patient Gender: M               HR:           103 bpm. Exam Location:  Inpatient Procedure: 2D Echo, Cardiac Doppler and Color Doppler Indications:    Stroke  History:        Patient has no prior history of Echocardiogram examinations.                 Seizure and Stroke.  Sonographer:    13/04/2021 Referring Phys: Billey Chang [facility] KHALIQDINA IMPRESSIONS  1. Left ventricular ejection fraction, by estimation, is 60 to 65%. The left ventricle has  normal function. The left ventricle has no regional wall motion abnormalities. Left ventricular diastolic parameters were normal.  2. Right ventricular systolic function is normal. The right ventricular size is normal.  3. The mitral valve is normal in structure. No evidence of mitral valve regurgitation. No evidence of mitral stenosis.  4. The aortic valve is normal in structure. Aortic valve regurgitation is not visualized. No aortic stenosis is present.  5. The inferior vena cava is normal in size with greater than 50% respiratory variability, suggesting right atrial pressure of 3 mmHg. Conclusion(s)/Recommendation(s): No intracardiac source of embolism detected on this transthoracic study. A transesophageal echocardiogram is recommended to exclude cardiac source of embolism if clinically indicated. FINDINGS  Left Ventricle: Left ventricular ejection fraction, by estimation, is 60 to 65%. The left ventricle  has normal function. The left ventricle has no regional wall motion abnormalities. The left ventricular internal cavity size was normal in size. There is  no left ventricular hypertrophy. Left ventricular diastolic parameters were normal. Right Ventricle: The right ventricular size is normal. No increase in right ventricular wall thickness. Right ventricular systolic function is normal. Left Atrium: Left atrial size was normal in size. Right Atrium: Right atrial size was normal in size. Pericardium: There is no evidence of pericardial effusion. Mitral Valve: The mitral valve is normal in structure. No evidence of mitral valve regurgitation. No evidence of mitral valve stenosis. Tricuspid Valve: The tricuspid valve is normal in structure. Tricuspid valve regurgitation is not demonstrated. No evidence of tricuspid stenosis. Aortic Valve: The aortic valve is normal in structure. Aortic valve regurgitation is not visualized. No aortic stenosis is present. Aortic valve mean gradient measures 4.0 mmHg. Aortic valve peak gradient measures 8.1 mmHg. Aortic valve area, by VTI measures 2.09 cm. Pulmonic Valve: The pulmonic valve was normal in structure. Pulmonic valve regurgitation is not visualized. No evidence of pulmonic stenosis. Aorta: The aortic root is normal in size and structure. Venous: The inferior vena cava is normal in size with greater than 50% respiratory variability, suggesting right atrial pressure of 3 mmHg. IAS/Shunts: No atrial level shunt detected by color flow Doppler.  LEFT VENTRICLE PLAX 2D LVIDd:         4.70 cm   Diastology LVIDs:         2.90 cm   LV e' medial: 10.60 cm/s LV PW:         1.00 cm LV IVS:        1.00 cm LVOT diam:     1.90 cm LV SV:         46 LV SV Index:   24 LVOT Area:     2.84 cm  RIGHT VENTRICLE RV S prime:     20.30 cm/s LEFT ATRIUM             Index LA diam:        2.90 cm 1.49 cm/m LA Vol (A2C):   40.5 ml 20.75 ml/m LA Vol (A4C):   37.2 ml 19.06 ml/m LA Biplane Vol: 40.9  ml 20.96 ml/m  AORTIC VALVE                    PULMONIC VALVE AV Area (Vmax):    2.04 cm     PV Vmax:       1.37 m/s AV Area (Vmean):   2.18 cm     PV Peak grad:  7.5 mmHg AV Area (VTI):     2.09 cm AV Vmax:  142.00 cm/s AV Vmean:          92.800 cm/s AV VTI:            0.220 m AV Peak Grad:      8.1 mmHg AV Mean Grad:      4.0 mmHg LVOT Vmax:         102.00 cm/s LVOT Vmean:        71.300 cm/s LVOT VTI:          0.162 m LVOT/AV VTI ratio: 0.74  AORTA Ao Root diam: 2.90 cm Ao Asc diam:  2.60 cm  SHUNTS Systemic VTI:  0.16 m Systemic Diam: 1.90 cm Rachelle Hora Croitoru MD Electronically signed by Thurmon Fair MD Signature Date/Time: 07/27/2021/10:30:31 AM    Final    CT VENOGRAM HEAD  Result Date: 07/27/2021 CLINICAL DATA:  Initial evaluation for acute altered mental status, evaluate for basilar thrombus. EXAM: CT ANGIOGRAPHY HEAD AND NECK WITH CONTRAST CT VENOGRAM HEAD WITH CONTRAST TECHNIQUE: Multidetector CT imaging of the head and neck was performed using the standard protocol during bolus administration of intravenous contrast. Multiplanar CT image reconstructions and MIPs were obtained to evaluate the vascular anatomy. Carotid stenosis measurements (when applicable) are obtained utilizing NASCET criteria, using the distal internal carotid diameter as the denominator. CONTRAST:  2mL OMNIPAQUE IOHEXOL 350 MG/ML SOLN COMPARISON:  Prior noncontrast head CT from 07/26/2021./ FINDINGS: CTA NECK FINDINGS Aortic arch: Visualized aortic arch normal in caliber with normal branch pattern. No stenosis or other abnormality about the origin of the great vessels. Right carotid system: Right common and internal carotid arteries widely patent without stenosis, dissection or occlusion. Left carotid system: Left common and internal carotid arteries widely patent without stenosis, dissection or occlusion. Vertebral arteries: Both vertebral arteries arise from the subclavian arteries. No proximal subclavian artery  stenosis. Right vertebral artery widely patent within the neck without stenosis or other abnormality. On the left, there is intimal irregularity with intraluminal filling defect involving the proximal left V1 segment, concerning for acute dissection (series 7, image 130). Area of involvement begins at or just beyond the origin, and extends approximately 2.3 cm in length. Associated moderate to severe stenosis proximally (series 5, image 271). Distally, the left vertebral artery is otherwise widely patent to the skull base. Skeleton: No visible acute osseous finding. No discrete or worrisome osseous lesions. Other neck: No other acute soft tissue abnormality within the neck. Endotracheal and enteric tubes in place. Upper chest: Mild atelectatic changes noted dependently within the visualized lungs. Visualized upper chest demonstrates no other acute finding. Review of the MIP images confirms the above findings CTA HEAD FINDINGS Anterior circulation: Both internal carotid arteries widely patent to the termini without stenosis. A1 segments widely patent. Normal anterior communicating artery complex. Both anterior cerebral arteries widely patent to their distal aspects without stenosis. No M1 stenosis or occlusion. Normal MCA bifurcations. Distal MCA branches well perfused and symmetric. Posterior circulation: Both V4 segments patent to the vertebrobasilar junction without stenosis or other abnormality. Both PICA origins patent and normal. Basilar widely patent proximally. There is some a subtle irregular filling defect involving the distal basilar artery/basilar tip, concerning for intraluminal thrombus (series 7, image 128). Secondary mild to moderate narrowing at the basilar tip. Neither superior cerebellar artery well visualized at the origin., and could be occluded. Irregular partially occlusive filling defect seen involving the left P1 segment with resultant moderate to severe stenosis (series 7, image 129). Left  PCA otherwise widely patent distally. On the right,  there is acute occlusion at the distal right P2 segment (series 7, image 141). Perfusion within right PCA branches distally could be related to subocclusive thrombus and/or collateralization. Venous sinuses: Dedicated venogram images were performed. Normal enhancement seen throughout the superior sagittal sinus to the level of the torcula. Transverse and sigmoid sinuses are patent bilaterally. Proximal internal jugular veins patent. Straight sinus, vein of Galen, internal cerebral veins, and basal veins of Rosenthal are patent. No visible abnormality about the cavernous sinus. Superior orbital veins grossly symmetric and within normal limits. No evidence for dural sinus thrombosis. No appreciable cortical venous thrombosis. Anatomic variants: None significant.  No aneurysm. Review of the MIP images confirms the above findings IMPRESSION: 1. 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 2. Subtle irregular nonocclusive filling defect involving the distal basilar artery/basilar tip, consistent with associated distal embolic disease. Adjacent superior cerebellar arteries are not visualized at their origins, and could be occluded. 3. Associated partially occlusive thrombus with severe stenosis involving the left P1 segment. 4. Additional distal right P2 embolic occlusion. 5. Wide patency of both carotid artery systems and anterior circulation. 6. Negative CT venogram.  No evidence for dural sinus thrombosis. Critical Value/emergent results were called by telephone at the time of interpretation on 07/27/2021 at 1:26 am to provider [facility] , who verbally acknowledged these results. Electronically Signed   By: Rise Mu M.D.   On: 07/27/2021 02:08   IR PERCUTANEOUS ART THROMBECTOMY/INFUSION INTRACRANIAL INC DIAG ANGIO  Result Date: 07/28/2021 INDICATION:  Onset of seizures, decreased level of consciousness. 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 EMERGENT LARGE VESSEL OCCLUSION THROMBOLYSIS (POSTERIOR CIRCULATION) COMPARISON:  CT angiogram of the head and neck July 27, 2021. MEDICATIONS: Ancef 2 g IV antibiotic was administered within 1 hour of the procedure. ANESTHESIA/SEDATION: General anesthesia. CONTRAST:  Omnipaque 300 proximally 130 mL. FLUOROSCOPY TIME:  Fluoroscopy Time: 72 minutes 42 seconds (4188 mGy). COMPLICATIONS: None immediate. TECHNIQUE: Following a full explanation of the procedure along with the potential associated complications, an informed witnessed consent was obtained. The risks of intracranial hemorrhage of 10%, worsening neurological deficit, ventilator dependency, death and inability to revascularize were all reviewed in detail with the patient's sister. The patient was then put under general anesthesia by the Department of Anesthesiology at [facility]. The right groin was prepped and draped in the usual sterile fashion. Thereafter using modified Seldinger technique, transfemoral access into the right common femoral artery was obtained without difficulty. Over a 0.035 inch guidewire an 8 [place] Pinnacle 25 cm sheath was inserted. Through this, and also over a 0.035 inch guidewire a 5 [place] JB 1 catheter was advanced to the aortic arch region and selectively positioned in the right common carotid artery, the right subclavian artery, the left vertebral artery. FINDINGS: The right common carotid arteriogram demonstrates the right external carotid artery and its major branches to be widely patent. The right internal carotid artery at the bulb to the cranial skull base is widely patent. There is a mild to moderate stenosis at the cervical petrous  junction. More distally, the petrous, the cavernous and the supraclinoid segments are widely patent. The right middle cerebral artery and the right anterior cerebral artery opacify into the capillary and venous phases. Transient cross-filling via the anterior communicating artery of  the left anterior cerebral A2 segment and distally is seen. The innominate arteriogram demonstrates the right common carotid artery proximally, and the right subclavian artery proximally to be widely patent. The right vertebral artery is seen to be patent with opacification made to the cranial skull base. The left vertebral artery arteriogram demonstrates abnormal appearance in the proximal 1/3 starting at the origin. There is a tapered irregularity associated with severe stenosis and a prominent smooth filling defect. More distally, the vessel opacifies normally to the cranial skull base. Patency is seen of the left vertebrobasilar junction and the left posterior-inferior cerebellar artery. Prominent filling defects are seen in the distal basilar artery, and the left posterior cerebral artery at its origin, and the right posterior cerebral artery in the distal P1 segment. PROCEDURE: The diagnostic JB 1 catheter in the distal right subclavian artery was then exchanged over a 0.035 inch 300 cm Rosen exchange guidewire for a 90 cm 8 [place] Neuron Max sheath, which was advanced to the distal right subclavian artery. The guidewire was removed. Good aspiration obtained from the hub of the Neuron Max sheath. A control arteriogram performed through the Neuron Max sheath after it had been withdrawn just proximal to the origin of the right vertebral artery demonstrates no evidence of spasms, dissections or of intraluminal filling defects. Over a 0.035 Roadrunner guidewire, a diagnostic JB 1 catheter was advanced within the Neuron Max sheath to its distal end. The vertebral artery was then selected with the guidewire followed by the 5 French  diagnostic catheter which, the 8 [place] Neuron Max sheath was advanced to the distal cervical right ICA. The guidewire, and the support catheter were removed. Good aspiration obtained from the hub of the Neuron Max sheath. A gentle control arteriogram performed through this demonstrated no evidence of spasms, dissections or of intraluminal filling defects. The opacified right vertebral artery distally, the right vertebrobasilar junction and the right posterior-inferior cerebellar artery demonstrate wide patency. Cross-filling of the left vertebrobasilar junction was noted. The basilar artery and its proximal 2/3, the anterior-inferior cerebellar arteries demonstrate uniform enhancement. However, prominent pre occlusive filling defects noted in the distal 1/3 of the basilar artery extending into the right posterior cerebral artery distal P1 segment, and the origin of the left posterior cerebral artery. ENDOVASCULAR REVASCULARIZATION OF DISTAL BASILAR ARTERY THROMBOSIS, AND OF THE POSTERIOR CEREBRAL ARTERIES AND OF THE SYMPTOMATIC DISSECTED PROXIMAL LEFT VERTEBRAL ARTERY EXTENDING TO THE ORIGIN. Over a 0.014 inch standard Synchro micro guidewire with a moderate J configuration, the combination of a 162 cm 021 microcatheter inside of an 071 132 cm Zoom aspiration catheter combination was advanced to the right vertebrobasilar junction. The micro guidewire was then gently advanced more distally into the distal basilar artery initially and then into the left posterior cerebral artery distal P2 segment. The guidewire was removed. Good aspiration obtained from the hub of the microcatheter. Gentle control arteriogram performed through the microcatheter demonstrated safe positioning of the tip of the microcatheter. This was then connected to continuous heparinized saline infusion. At this time, a 4 mm x 40 mm Solitaire X retrieval device was advanced to the distal end of the microcatheter. The O ring on the delivery  microcatheter was loosened. With slight forward gentle traction with the right hand on the delivery micro guidewire, with the left hand the delivery microcatheter was unsheathed deploying the retrieval device. The proximal portion of the device was seen in the distal basilar artery. Thereafter, with constant aspiration with a Penumbra aspiration device at  the hub of the 071 Zoom aspiration catheter and a 60 mL syringe at the hub of the Neuron Max sheath for approximately 3-1/2 minutes, the combination of the retrieval device, the 071 Zoom aspiration catheter, and the microcatheter were retrieved and removed. A control arteriogram performed through the Neuron Max sheath in the distal right vertebral artery demonstrated near complete revascularization of the distal basilar artery and the posterior cerebral arteries. A small filling defect was noted in the distal left superior cerebellar artery. This was deemed too distal and small for endovascular retrieval. A TICI 2C reperfusion was achieved. The sheath was then retrieved and positioned at the origin of the left vertebral artery. A control arteriogram performed through the Neuron Max sheath just proximal to the origin of the left vertebral artery continued to demonstrate sequela of a long segment dissection associated with an intimal flap, and a mural hematoma. Associated significant narrowing was noted. However, more distally, the contrast was seen to the cranial skull base into the left vertebrobasilar junction and the left posterior-inferior cerebellar artery. 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 A 2.4 mm x 5 mm Emboshield device was then prepped and purged of air in its housing. The device was then retrieved into the delivery microcatheter. The combination of the 014 inch and the delivery microcatheter was retrieved from the housing. A J configuration was given to the tip of the micro guidewire. Under constant roadmap technique and constant  fluoroscopic guidance, the combination of the Emboshield delivery catheter, and the micro guidewire was advanced to just proximal to the origin of the left vertebral artery. Using a torque device, access with the micro guidewire was first obtained without contact with the underlying dissected segment. The combination was then advanced without difficulty to the cranial skull base to the level of C1-C2. In the usual manner, the filter device was then deployed. The delivery microcatheter was retrieved and removed while maintaining constant positioning of tip of the micro guidewire. Measurements were then performed of the proximal left vertebral artery in its most normal segment. The left dissection was also measured. It was decided to proceed with placement of a 3.5 mm x 34 mm Onyx Frontier balloon mountable stent. This was prepped with 75% contrast and 25% heparinized saline infusion antegradely, and with just heparinized saline infusion retrogradely. Thereafter using the rapid exchange technique, the combination was advanced without difficulty to the proximal left vertebral artery. The proximal and the distal landing zones were then identified. The stent was then deployed by inflating the balloon mountable stent using micro inflation syringe device via micro tubing to just over 3 mm where it was maintained for approximately 15 seconds. Thereafter, the balloon was deflated and removed. A control arteriogram performed through Neuron Max sheath just proximal to the origin of the left vertebral artery now demonstrated excellent apposition of the stent with wide patency of the left vertebral artery proximally. More distally, this contrast flow was noted into the left vertebrobasilar junction, the basilar artery and the just revascularized posterior cerebral arteries. No evidence of intraluminal filling defects was seen extra cranially or intracranially. The filter capture device was then advanced again using the rapid  exchange technique to just the proximal marker on the device. The filter device was then captured into the capture device by retrieving on the micro guidewire. The combination was then retrieved slowly under constant fluoroscopic guidance ensuring no entanglement with the planted stent in the proximal left vertebral artery. Control arteriogram was then performed  at 10 and 20 minutes post deployment of the stent. These continued to demonstrate excellent flow in the proximal left vertebral artery. Intracranially the TICI 2C revascularization was maintained. Following this, the Neuron Max sheath was then retrieved and removed. The 8 French sheath was removed with hemostasis achieved at the right groin puncture site with a 6 French Angio-Seal closure device. Distal pulses remained palpable in both feet unchanged. A CT of the brain obtained demonstrated no evidence of intracranial hemorrhage or hydrocephalus. Prior to planting the stent at the second site in the proximal left vertebral artery, patient was given 81 mg of aspirin, and 180 mg of Brilinta via an orogastric tube. Also right after stent placement, patient was given a bolus dose of cangrelor followed by a low-dose four hour infusion. Patient was left intubated on account of a pre procedural clinical condition. He was then transferred to the neuro ICU for post revascularization measures. IMPRESSION: Status post endovascular revascularization of occluded distal basilar artery and both posterior cerebral arteries as described above with a 4 mm x 4 mm Solitaire X retrieval device and contact aspiration achieving a TICI score of 2C. 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 PLAN: Follow-up in the clinic 2 weeks post discharge. Electronically Signed   By: Julieanne Cotton M.D.   On: 07/28/2021 08:14   IR ANGIO EXTRACRAN SEL COM CAROTID  INNOMINATE UNI R MOD SED  Result Date: 07/28/2021 INDICATION: Onset of seizures, decreased level of consciousness. 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 EMERGENT LARGE VESSEL OCCLUSION THROMBOLYSIS (POSTERIOR CIRCULATION) COMPARISON:  CT angiogram of the head and neck July 27, 2021. MEDICATIONS: Ancef 2 g IV antibiotic was administered within 1 hour of the procedure. ANESTHESIA/SEDATION: General anesthesia. CONTRAST:  Omnipaque 300 proximally 130 mL. FLUOROSCOPY TIME:  Fluoroscopy Time: 72 minutes 42 seconds (4188 mGy). COMPLICATIONS: None immediate. TECHNIQUE: Following a full explanation of the procedure along with the potential associated complications, an informed witnessed consent was obtained. The risks of intracranial hemorrhage of 10%, worsening neurological deficit, ventilator dependency, death and inability to revascularize were all reviewed in detail with the patient's sister. The patient was then put under general anesthesia by the Department of Anesthesiology at [facility]. The right groin was prepped and draped in the usual sterile fashion. Thereafter using modified Seldinger technique, transfemoral access into the right common femoral artery was obtained without difficulty. Over a 0.035 inch guidewire an 8 [place] Pinnacle 25 cm sheath was inserted. Through this, and also over a 0.035 inch guidewire a 5 [place] JB 1 catheter was advanced to the aortic arch region and selectively positioned in the right common carotid artery, the right subclavian artery, the left vertebral artery. FINDINGS: The right common carotid arteriogram demonstrates the right external carotid artery and its major branches to be widely patent. The right internal carotid artery at the bulb to the cranial skull base is widely patent.  There is a mild to moderate stenosis at the cervical petrous junction. More distally, the petrous, the cavernous and the supraclinoid segments are widely patent. The right middle cerebral artery and the right anterior cerebral artery opacify into the capillary and venous phases. Transient cross-filling via the anterior communicating artery of the left anterior cerebral A2 segment and distally is seen.  The innominate arteriogram demonstrates the right common carotid artery proximally, and the right subclavian artery proximally to be widely patent. The right vertebral artery is seen to be patent with opacification made to the cranial skull base. The left vertebral artery arteriogram demonstrates abnormal appearance in the proximal 1/3 starting at the origin. There is a tapered irregularity associated with severe stenosis and a prominent smooth filling defect. More distally, the vessel opacifies normally to the cranial skull base. Patency is seen of the left vertebrobasilar junction and the left posterior-inferior cerebellar artery. Prominent filling defects are seen in the distal basilar artery, and the left posterior cerebral artery at its origin, and the right posterior cerebral artery in the distal P1 segment. PROCEDURE: The diagnostic JB 1 catheter in the distal right subclavian artery was then exchanged over a 0.035 inch 300 cm Rosen exchange guidewire for a 90 cm 8 [place] Neuron Max sheath, which was advanced to the distal right subclavian artery. The guidewire was removed. Good aspiration obtained from the hub of the Neuron Max sheath. A control arteriogram performed through the Neuron Max sheath after it had been withdrawn just proximal to the origin of the right vertebral artery demonstrates no evidence of spasms, dissections or of intraluminal filling defects. Over a 0.035 Roadrunner guidewire, a diagnostic JB 1 catheter was advanced within the Neuron Max sheath to its distal end. The vertebral artery was  then selected with the guidewire followed by the 5 French diagnostic catheter which, the 8 [place] Neuron Max sheath was advanced to the distal cervical right ICA. The guidewire, and the support catheter were removed. Good aspiration obtained from the hub of the Neuron Max sheath. A gentle control arteriogram performed through this demonstrated no evidence of spasms, dissections or of intraluminal filling defects. The opacified right vertebral artery distally, the right vertebrobasilar junction and the right posterior-inferior cerebellar artery demonstrate wide patency. Cross-filling of the left vertebrobasilar junction was noted. The basilar artery and its proximal 2/3, the anterior-inferior cerebellar arteries demonstrate uniform enhancement. However, prominent pre occlusive filling defects noted in the distal 1/3 of the basilar artery extending into the right posterior cerebral artery distal P1 segment, and the origin of the left posterior cerebral artery. ENDOVASCULAR REVASCULARIZATION OF DISTAL BASILAR ARTERY THROMBOSIS, AND OF THE POSTERIOR CEREBRAL ARTERIES AND OF THE SYMPTOMATIC DISSECTED PROXIMAL LEFT VERTEBRAL ARTERY EXTENDING TO THE ORIGIN. Over a 0.014 inch standard Synchro micro guidewire with a moderate J configuration, the combination of a 162 cm 021 microcatheter inside of an 071 132 cm Zoom aspiration catheter combination was advanced to the right vertebrobasilar junction. The micro guidewire was then gently advanced more distally into the distal basilar artery initially and then into the left posterior cerebral artery distal P2 segment. The guidewire was removed. Good aspiration obtained from the hub of the microcatheter. Gentle control arteriogram performed through the microcatheter demonstrated safe positioning of the tip of the microcatheter. This was then connected to continuous heparinized saline infusion. At this time, a 4 mm x 40 mm Solitaire X retrieval device was advanced to the distal end  of the microcatheter. The O ring on the delivery microcatheter was loosened. With slight forward gentle traction with the right hand on the delivery micro guidewire, with the left hand the delivery microcatheter was unsheathed deploying the retrieval device. The proximal portion of the device was seen in the distal basilar artery. Thereafter, with constant aspiration with a Penumbra aspiration device at the hub of the 071 Zoom aspiration catheter and a  60 mL syringe at the hub of the Neuron Max sheath for approximately 3-1/2 minutes, the combination of the retrieval device, the 071 Zoom aspiration catheter, and the microcatheter were retrieved and removed. A control arteriogram performed through the Neuron Max sheath in the distal right vertebral artery demonstrated near complete revascularization of the distal basilar artery and the posterior cerebral arteries. A small filling defect was noted in the distal left superior cerebellar artery. This was deemed too distal and small for endovascular retrieval. A TICI 2C reperfusion was achieved. The sheath was then retrieved and positioned at the origin of the left vertebral artery. A control arteriogram performed through the Neuron Max sheath just proximal to the origin of the left vertebral artery continued to demonstrate sequela of a long segment dissection associated with an intimal flap, and a mural hematoma. Associated significant narrowing was noted. However, more distally, the contrast was seen to the cranial skull base into the left vertebrobasilar junction and the left posterior-inferior cerebellar artery. 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 A 2.4 mm x 5 mm Emboshield device was then prepped and purged of air in its housing. The device was then retrieved into the delivery microcatheter. The combination of the 014 inch and the delivery microcatheter was retrieved from the housing. A J configuration was given to the tip of the micro  guidewire. Under constant roadmap technique and constant fluoroscopic guidance, the combination of the Emboshield delivery catheter, and the micro guidewire was advanced to just proximal to the origin of the left vertebral artery. Using a torque device, access with the micro guidewire was first obtained without contact with the underlying dissected segment. The combination was then advanced without difficulty to the cranial skull base to the level of C1-C2. In the usual manner, the filter device was then deployed. The delivery microcatheter was retrieved and removed while maintaining constant positioning of tip of the micro guidewire. Measurements were then performed of the proximal left vertebral artery in its most normal segment. The left dissection was also measured. It was decided to proceed with placement of a 3.5 mm x 34 mm Onyx Frontier balloon mountable stent. This was prepped with 75% contrast and 25% heparinized saline infusion antegradely, and with just heparinized saline infusion retrogradely. Thereafter using the rapid exchange technique, the combination was advanced without difficulty to the proximal left vertebral artery. The proximal and the distal landing zones were then identified. The stent was then deployed by inflating the balloon mountable stent using micro inflation syringe device via micro tubing to just over 3 mm where it was maintained for approximately 15 seconds. Thereafter, the balloon was deflated and removed. A control arteriogram performed through Neuron Max sheath just proximal to the origin of the left vertebral artery now demonstrated excellent apposition of the stent with wide patency of the left vertebral artery proximally. More distally, this contrast flow was noted into the left vertebrobasilar junction, the basilar artery and the just revascularized posterior cerebral arteries. No evidence of intraluminal filling defects was seen extra cranially or intracranially. The filter  capture device was then advanced again using the rapid exchange technique to just the proximal marker on the device. The filter device was then captured into the capture device by retrieving on the micro guidewire. The combination was then retrieved slowly under constant fluoroscopic guidance ensuring no entanglement with the planted stent in the proximal left vertebral artery. Control arteriogram was then performed at 10 and 20 minutes post deployment of the stent.  These continued to demonstrate excellent flow in the proximal left vertebral artery. Intracranially the TICI 2C revascularization was maintained. Following this, the Neuron Max sheath was then retrieved and removed. The 8 French sheath was removed with hemostasis achieved at the right groin puncture site with a 6 French Angio-Seal closure device. Distal pulses remained palpable in both feet unchanged. A CT of the brain obtained demonstrated no evidence of intracranial hemorrhage or hydrocephalus. Prior to planting the stent at the second site in the proximal left vertebral artery, patient was given 81 mg of aspirin, and 180 mg of Brilinta via an orogastric tube. Also right after stent placement, patient was given a bolus dose of cangrelor followed by a low-dose four hour infusion. Patient was left intubated on account of a pre procedural clinical condition. He was then transferred to the neuro ICU for post revascularization measures. IMPRESSION: Status post endovascular revascularization of occluded distal basilar artery and both posterior cerebral arteries as described above with a 4 mm x 4 mm Solitaire X retrieval device and contact aspiration achieving a TICI score of 2C. 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 PLAN: Follow-up in the clinic 2 weeks post discharge. Electronically Signed   By: Julieanne Cotton M.D.   On:  07/28/2021 08:14       HISTORY OF PRESENT ILLNESS   Roy [name] is a 25 y.o. male with no significant PMH who presents with shaking and unresponsiveness. Patient is intubated and unable to provide any history. Most of the history obtained from sister and fiance over phone. Per fiance, he has been reporting that his head hurts for the last 3-4 days along with feeling cold. Today, they smoked marijuana and he took 4 shots of Vodka. They were having intercourse when he fell out and the left side of his body went numb. He was sweating profusely and kept saying "my brain" and then started having seizures. This happened about 30 mins to an hours after taking marijuana. No prior hx of seizures. He has been reporting headache for the last 3-4 days. Today started throwing up. He threw up a lot today. Spends a lot of time in a shed but comes to sister's home every night to sleep. The shed does not have proper ventilation, it is full of stuff but nothing toxic that sister or fiance is aware of. No obvious exposure to Carbon Monoxide. No fever, no chills but has been feeling cold.   The fiance immediately called EMS for concern for seizure like activity. Per notes, the seizure like activity stopped enroute by the time EMS got an IV so he was not given any Versed. It resumed in the ED and he was given ativan 4mg , Keppra 2G in the ED with persistent shaking and was intubated with rocuronium and etomidate and started on propofol.   On my evaluation, he has tongue movements and twitching along with jerks mostly in the LUE and RLE. Jerking appears arhythmic on my evaluation and ?myoclonic.   No recent water fasts, he is not a regular drinker, has used marijuana in the past without any issues. Fiance and another friend also smoked the same marijuana without any issues. He was fine all day otherwise. No recent head injury with loss of consciousness, no family history of seizures.  HOSPITAL COURSE  25 year old male with  history of obesity admitted for unresponsiveness and shaking jerking movements.  This symptoms happened after  THC and alcohol abuse, and during sexual activity he fell, had left-sided numbness, diaphoresis, headache, nausea vomiting and seizure-like activity.  He received Versed Ativan and Keppra for presumed seizure activity.  Intubated in the ER, CT negative, CT head and neck showed left V1 dissection with thrombus formation, distal basilar artery thrombosis, right P2 and left P1 occlusion due to thrombus.  Status post IR with TICI2c of BA and PCAs, also left VA stenting.  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  CT head No acute abnormality. CTA head & neck Intimal irregularity with filling defect involving the proximal left vertebral artery, V1 segment, concerning for acute arterial dissection. Subtle irregular nonocclusive filling defect involving the distal basilar artery/basilar tip, consistent with associated distal embolic disease. Adjacent superior cerebellar arteries are not visualized at their origins, and could be occluded. Associated partially occlusive thrombus with severe stenosis involving the left P1 segment. Additional distal right P2 embolic occlusion  IR S/P revascularization of occuded distal basilar artery and both PCAs and with stent assisted angioplasty for symptomatic prox LT VA dissection  Post IR CT  No acute intracranial pathology.  MRI multifocal infarcts involving bilateral superior cerebellum, left pons, central midbrain, right thalamus, medial temporal and occipital lobes  2D Echo EF 60-65%, normal LA, no IA shunt LDL 116 HgbA1c 5.7 VTE prophylaxis - lovenox No antithrombotic prior to admission, now on aspirin 81 mg daily and  Brilinta (ticagrelor) 90 mg bid given stenting   Therapy recommendations:  CIR Disposition:  CIR today   Involuntary movement EEG Excessive beta activity.  No seizures Repeat EEG: within normal limits. No seizures or epileptiform activity noted.  Per Dr. Pearlean Brownie "appear to be stimulus sensitive myoclonus versus tremors"  Could be related to basilar artery occlusion, double seizures No AED needed at this time   Hyperlipidemia Home meds:  none LDL 116, goal < 70 lipitor 40mg     Continue statin at discharge   Tobacco abuse Current smoker Smoking cessation counseling provided Pt is willing to quit   Dysphagia  On regular diet Cortrak has been removed Encourage po intake   Leukocytosis  WBC 11.6-10.3-9.5-10.8-10.8->11.0->12.8->12.9->12.0 Afebrile UA negative CXR unremarkable   Other Stroke Risk Factors  Substance abuse - UDS:  THC POSITIVE. Patient advised to stop using due to stroke risk.  Obesity, Body mass index is 42.38 kg/m., BMI >/= 30 associated with increased stroke risk, recommend weight loss, diet and exercise as appropriate  Alcohol abuse - limiting alcohol use education provided - on B1/FA/MVI   Other Active Problems Respiratory failure, resolved  Depression on zoloft, seroquel Pink color tinged urine - resolved  DISCHARGE EXAM Blood pressure 129/78, pulse 87, temperature 98.6 F (37 C), temperature source Oral, resp. rate 16, height 5\' 2"  (1.575 m), weight 97.7 kg, SpO2 98 %.   General - Well nourished, well developed, in no apparent distress.   Ophthalmologic - fundi not visualized due to noncooperation.   Cardiovascular - Regular rhythm and rate.   Neuro - awake, alert, eyes open, orientated to age, place, year but stated December on month instead of November. No aphasia, however, moderate dysarthria, following all simple commands. Able to name and repeat in dysarthric voice. Left INO, right gaze right eye nystagmus, b/l upward gaze with up beat  nystagmus. Visual field  full, PERRL. Mild right facial droop. Tongue protrusion to the right slightly. LUE proximal 2/5, bicep 4/5, tricep 2+/5, finger grip 4-/5. RUE proximal 3/5, bicep 4/5, tricep 3/5, finger grip 4-/5. BLE proximal 3/5 and distal ankle DF 4+/5. Sensation decreased on the left, b/l FTN ataxic proportional to the weakness, gait not tested.    Discharge Diet      Diet   Diet regular Room service appropriate? Yes; Fluid consistency: Thin   liquids  DISCHARGE PLAN Disposition:  Transfer to [facility] for ongoing PT, OT and ST aspirin 81 mg daily and Brilinta (ticagrelor) 90 mg bid for secondary stroke prevention   Recommend ongoing stroke risk factor control by Primary Care Physician at time of discharge from inpatient rehabilitation. Follow-up PCP Pcp, No in 2 weeks following discharge from rehab. Follow-up in [facility] in 4 weeks following discharge from rehab, office to schedule an appointment.   35  minutes were spent preparing discharge.   Marvel Plan, MD PhD Stroke Neurology 08/05/2021 10:34 AM

## 2021-08-05 NOTE — Progress Notes (Signed)
STROKE TEAM PROGRESS NOTE  INTERVAL HISTORY No one is at the bedside atime of this exam No new neurological events overnight. Patient is very interactive and in good spirits, laughing and joking around.  Informed them we are in the process of arranging CIR and will keep them updated.   Patient had episode last night where leg was shaking uncontrollably for about 20 seconds.   Vitals:   08/04/21 2210 08/05/21 0442 08/05/21 0500 08/05/21 0741  BP: 131/81 133/77  129/78  Pulse: 97 95  87  Resp: 20 20  16   Temp: 98.7 F (37.1 C) 98.5 F (36.9 C)  98.6 F (37 C)  TempSrc: Oral Oral  Oral  SpO2: 99% 97%  98%  Weight:   97.7 kg   Height:       CBC:  Recent Labs  Lab 08/04/21 0132 08/05/21 0215  WBC 12.9* 12.0*  HGB 13.2 12.6*  HCT 37.9* 38.0*  MCV 89.0 89.8  PLT 508* 502*    Basic Metabolic Panel:  Recent Labs  Lab 07/29/21 1631 07/30/21 0306 07/31/21 0324 08/04/21 0132 08/05/21 0215  NA  --  134*   < > 130* 132*  K  --  4.0   < > 3.9 4.2  CL  --  102   < > 96* 98  CO2  --  22   < > 22 22  GLUCOSE  --  95   < > 96 98  BUN  --  12   < > 17 17  CREATININE  --  0.84   < > 0.87 0.88  CALCIUM  --  8.9   < > 9.3 9.5  MG 2.2 2.0   < > 2.2 2.2  PHOS 3.2 4.1  --   --   --    < > = values in this interval not displayed.    Lipid Panel:  No results for input(s): CHOL, TRIG, HDL, CHOLHDL, VLDL, LDLCALC in the last 168 hours.  HgbA1c:  No results for input(s): HGBA1C in the last 168 hours.  Urine Drug Screen:  No results for input(s): LABOPIA, COCAINSCRNUR, LABBENZ, AMPHETMU, THCU, LABBARB in the last 168 hours.   Alcohol Level  No results for input(s): ETH in the last 168 hours.   IMAGING past 24 hours No results found.  PHYSICAL EXAM  Temp:  [97.9 F (36.6 C)-98.7 F (37.1 C)] 98.6 F (37 C) (11/17 0741) Pulse Rate:  [87-98] 87 (11/17 0741) Resp:  [16-20] 16 (11/17 0741) BP: (129-147)/(77-97) 129/78 (11/17 0741) SpO2:  [97 %-100 %] 98 % (11/17  0741) Weight:  [97.7 kg] 97.7 kg (11/17 0500)  General - Well nourished, well developed, in no apparent distress.  Ophthalmologic - fundi not visualized due to noncooperation.  Cardiovascular - Regular rhythm and rate.  Neuro - awake, alert, eyes open, orientated to age, place, year but stated December on month instead of November. No aphasia, however, moderate dysarthria, following all simple commands. Able to name and repeat in dysarthric voice. Left INO, right gaze right eye nystagmus, b/l upward gaze with up beat nystagmus. Visual field full, PERRL. Mild right facial droop. Tongue protrusion to the right slightly. LUE proximal 2/5, bicep 4/5, tricep 2+/5, finger grip 4-/5. RUE proximal 3/5, bicep 4/5, tricep 3/5, finger grip 4-/5. BLE proximal 3/5 and distal ankle DF 4+/5. Sensation decreased on the left, b/l FTN ataxic proportional to the weakness, gait not tested.    ASSESSMENT/PLAN 25 year old male with history of obesity admitted for  unresponsiveness and shaking jerking movements.  This symptoms happened after THC and alcohol abuse, and during sexual activity he fell, had left-sided numbness, diaphoresis, headache, nausea vomiting and seizure-like activity.  He received Versed Ativan and Keppra for presumed seizure activity.  Intubated in the ER, CT negative, CT head and neck showed left V1 dissection with thrombus formation, distal basilar artery thrombosis, right P2 and left P1 occlusion due to thrombus.  Status post IR with TICI2c of BA and PCAs, also left VA stenting.  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  CT head No acute abnormality. CTA head & neck Intimal irregularity with filling defect involving the proximal left vertebral artery, V1  segment, concerning for acute arterial dissection. Subtle irregular nonocclusive filling defect involving the distal basilar artery/basilar tip, consistent with associated distal embolic disease. Adjacent superior cerebellar arteries are not visualized at their origins, and could be occluded. Associated partially occlusive thrombus with severe stenosis involving the left P1 segment. Additional distal right P2 embolic occlusion  IR S/P revascularization of occuded distal basilar artery and both PCAs and with stent assisted angioplasty for symptomatic prox LT VA dissection  Post IR CT  No acute intracranial pathology.  MRI multifocal infarcts involving bilateral superior cerebellum, left pons, central midbrain, right thalamus, medial temporal and occipital lobes  2D Echo EF 60-65%, normal LA, no IA shunt LDL 116 HgbA1c 5.7 VTE prophylaxis - lovenox No antithrombotic prior to admission, now on aspirin 81 mg daily and Brilinta (ticagrelor) 90 mg bid given stenting   Therapy recommendations:  CIR Disposition:  pending   Involuntary movement EEG Excessive beta activity.  No seizures Repeat EEG: within normal limits. No seizures or epileptiform activity noted.  Per Dr. Pearlean Brownie "appear to be stimulus sensitive myoclonus versus tremors"  Could be related to basilar artery occlusion, double seizures No AED needed at this time  Hyperlipidemia Home meds:  none LDL 116, goal < 70 lipitor 40mg     Continue statin at discharge   Tobacco abuse Current smoker Smoking cessation counseling provided Pt is willing to quit  Dysphagia  On regular diet Cortrak has been removed Encourage po intake  Leukocytosis  WBC 11.6-10.3-9.5-10.8-10.8->11.0->12.8->12.9 Afebrile UA negative CXR unremarkable  Other Stroke Risk Factors  Substance abuse - UDS:  THC POSITIVE. Patient advised to stop using due to stroke risk.  Obesity, Body mass index is 42.38 kg/m., BMI >/= 30 associated with increased stroke risk,  recommend weight loss, diet and exercise as appropriate  Alcohol abuse - limiting alcohol use education provided - on B1/FA/MVI   Other Active Problems Respiratory failure, resolved  Depression on zoloft, seroquel Pink color tinged urine - resolved  Hospital day # 10        To contact Stroke Continuity provider, please refer to 05-28-2000. After hours, contact General Neurology

## 2021-08-05 NOTE — Progress Notes (Signed)
Courtney Heys, MD  Physician CASE MANAGEMENT PMR Pre-admission     Signed Date of Service:  07/29/2021  3:29 PM  Related encounter: ED to Hosp-Admission (Current) from 07/26/2021 in [facility]   Signed                                                                                                                                                                                                                                                                                                                                                                                                                                                                                                            PMR Admission Coordinator Pre-Admission Assessment   Patient: Roy [name] is an 25 y.o., male MRN: [id number] DOB: 09-Aug-1996 Height: 5' 2" (157.5 cm) Weight: 97.7 kg   Insurance Information Self pay - uninsured, no insurance.  Family are applying for medicaid per aunt  SECONDARY:        Policy#:       Phone#:     Development worker, community:  Phone#:     The "Data Collection Information Summary" for patients in Inpatient Rehabilitation Facilities with attached "Privacy Act [place] Records" was provided and verbally reviewed with: N/A   Emergency Contact Information Contact Information       Name Relation Home Work Mobile    [place] Significant other     [phone]           Current Medical History  Patient Admitting Diagnosis: B CVA, seizure   History of Present Illness:  A25 year old right-handed male with unremarkable past medical history on no prescription medications.  He does have a history of marijuana and alcohol use.  Per chart review he was living at his sister's house working at  [employer].  He does have a girlfriend.  Presented 07/26/2021 unresponsive after having intercourse with his girlfriend requiring intubation for airway protection as well as noted seizure.  His fiance/girlfriend reports that patient had been having complaints of headache for 3 to 4 days.  Cranial CT scan negative.  CT angiogram of head and neck showed intimal irregularity with filling defect involving the proximal left vertebral artery, V1 segment concerning for acute arterial dissection.  Subtle irregular nonocclusive filling defect involving the distal basilar artery/basilar tip consistent with associated distal embolic disease.  Adjacent superior cerebellar arteries were not visualized.  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  Negative CT venogram no evidence for dural sinus thrombosis.  Cranial CT scan was again repeated showing no acute pathology.  He was initially loaded with Keppra.  EEG showed no seizure activity and Keppra has since been discontinued.  Admission chemistries potassium 2.7-3.0, glucose 112, WBC 19,000, lactic acid 4.4, alcohol level less than 10, ammonia level 58, urine drug screen positive for marijuana.  Patient did undergo distal embolization to top of the basilar status post IR with left LV stenting per interventional radiology.  Currently maintained on aspirin 81 mg daily as well as Brilinta 90 mg twice daily.  Subcutaneous Lovenox for DVT prophylaxis.  Initially with Cortrak feeding tube diet is since been advanced to regular.  Bouts of restlessness agitation monitor closely on Seroquel nightly.  Therapy evaluations completed due to patient decreased functional mobility and now to be admitted for a comprehensive inpatient rehab program.   Complete NIHSS TOTAL: 8   Patient's medical record from [facility] has been reviewed by the rehabilitation admission coordinator and physician.    Past Medical History  History reviewed. No pertinent past medical history.   Has the patient had major surgery during 100 days prior to admission? Yes   Family History   family history is not on file.   Current Medications   Current Facility-Administered Medications:    acetaminophen (TYLENOL) tablet 650 mg, 650 mg, Oral, Q4H PRN, 650 mg at 08/04/21 2208 **OR** acetaminophen (TYLENOL) 160 MG/5ML solution 650 mg, 650 mg, Per Tube, Q4H PRN **OR** acetaminophen (TYLENOL) suppository 650 mg, 650 mg, Rectal, Q4H PRN, Omar Person, NP   aspirin chewable tablet 81 mg, 81 mg, Oral, Daily, 81 mg at 08/05/21 1005 **OR** [DISCONTINUED] aspirin chewable tablet 81 mg, 81 mg, Oral, Daily, Donnetta Simpers, MD, 81 mg at 07/31/21 0848   atorvastatin (LIPITOR) tablet 40 mg, 40 mg, Oral, QHS, Khaliqdina, Alferd Patee, MD, 40 mg at 08/04/21 2207   diphenhydrAMINE (BENADRYL) injection 12.5 mg, 12.5 mg, Intravenous, Once PRN, Donnetta Simpers, MD   docusate sodium (COLACE) capsule 100 mg, 100 mg, Oral,  BID, Donnetta Simpers, MD, 100 mg at 08/05/21 1005   enoxaparin (LOVENOX) injection 40 mg, 40 mg, Subcutaneous, Q24H, Rosalin Hawking, MD, 40 mg at 08/04/21 1445   feeding supplement (ENSURE ENLIVE / ENSURE PLUS) liquid 237 mL, 237 mL, Oral, BID BM, Rosalin Hawking, MD, 237 mL at 38/18/40 3754   folic acid (FOLVITE) tablet 1 mg, 1 mg, Oral, Daily, Rosalin Hawking, MD, 1 mg at 08/05/21 1005   insulin aspart (novoLOG) injection 0-15 Units, 0-15 Units, Subcutaneous, TID WC, Rosalin Hawking, MD   labetalol (NORMODYNE) injection 20 mg, 20 mg, Intravenous, Q2H PRN, Dewaine Oats, Katalina M, NP, 20 mg at 07/27/21 1124   melatonin tablet 3 mg, 3 mg, Oral, QHS, Donnetta Simpers, MD, 3 mg at 08/04/21 2208   multivitamin with minerals tablet 1 tablet, 1 tablet, Oral, Daily, Donnetta Simpers, MD, 1 tablet at 08/05/21 1005   polyethylene glycol (MIRALAX / GLYCOLAX) packet 17 g, 17 g, Oral, Daily, Lorrin Goodell, Alferd Patee, MD, 17 g at 08/05/21  1005   QUEtiapine (SEROQUEL XR) 24 hr tablet 50 mg, 50 mg, Oral, QHS, Eubanks, Katalina M, NP, 50 mg at 08/04/21 2207   sertraline (ZOLOFT) tablet 100 mg, 100 mg, Oral, Daily, Leonie Man, Pramod S, MD, 100 mg at 08/05/21 1005   thiamine tablet 100 mg, 100 mg, Oral, Daily, Rosalin Hawking, MD, 100 mg at 08/05/21 1005   ticagrelor (BRILINTA) tablet 90 mg, 90 mg, Oral, BID, 90 mg at 08/05/21 1005 **OR** ticagrelor (BRILINTA) tablet 90 mg, 90 mg, Per Tube, BID, Omar Person, NP, 90 mg at 08/01/21 2202   Patients Current Diet:  Diet Order                  Diet regular Room service appropriate? Yes; Fluid consistency: Thin  Diet effective now                         Precautions / Restrictions Precautions Precautions: Fall Precaution Comments: permissive HTN <220/120, R hemi, diplopia/visual deficits Restrictions Weight Bearing Restrictions: No    Has the patient had 2 or more falls or a fall with injury in the past year? No   Prior Activity Level Community (5-7x/wk): Went out daily. Walked to the store.  Was not driving.   Prior Functional Level Self Care: Did the patient need help bathing, dressing, using the toilet or eating? Independent   Indoor Mobility: Did the patient need assistance with walking from room to room (with or without device)? Independent   Stairs: Did the patient need assistance with internal or external stairs (with or without device)? Independent   Functional Cognition: Did the patient need help planning regular tasks such as shopping or remembering to take medications? Independent   Patient Information Are you of Hispanic, Latino/a,or Spanish origin?: A. No, not of Hispanic, Latino/a, or Spanish origin What is your race?: B. Black or African American Do you need or want an interpreter to communicate with a doctor or health care staff?: 0. No   Patient's Response To:  Health Literacy and Transportation Is the patient able to respond to health literacy and  transportation needs?: Yes Health Literacy - How often do you need to have someone help you when you read instructions, pamphlets, or other written material from your doctor or pharmacy?: Never In the past 12 months, has lack of transportation kept you from medical appointments or from getting medications?: No In the past 12 months, has lack of transportation kept you from meetings, work,  or from getting things needed for daily living?: No   Home Assistive Devices / Equipment   Prior Device Use: Indicate devices/aids used by the patient prior to current illness, exacerbation or injury? None of the above   Current Functional Level Cognition   Arousal/Alertness: Awake/alert Overall Cognitive Status: Impaired/Different from baseline Current Attention Level: Sustained Orientation Level: Oriented to person Following Commands: Follows one step commands consistently, Follows one step commands with increased time Safety/Judgement: Decreased awareness of safety, Decreased awareness of deficits General Comments: Pt follows one step commands consistently with increased time; can be slightly impulsive Attention: Sustained Sustained Attention: Impaired Sustained Attention Impairment: Verbal basic Memory: Impaired Awareness: Impaired Awareness Impairment: Anticipatory impairment, Intellectual impairment Problem Solving: Appears intact Executive Function: Self Monitoring, Self Correcting Self Monitoring: Appears intact Self Correcting: Appears intact    Extremity Assessment (includes Sensation/Coordination)   Upper Extremity Assessment: Generalized weakness, RUE deficits/detail, LUE deficits/detail RUE Deficits / Details: decreased awareness of position in space, L worse than R, grasp 3+/5, low attention and fatigues quickly RUE Sensation: decreased proprioception RUE Coordination: decreased fine motor, decreased gross motor LUE Deficits / Details: decreased awareness of position in space, L  worse than R, grasp 3+/5, low attention and fatigues quickly LUE Sensation: decreased proprioception LUE Coordination: decreased fine motor, decreased gross motor  Lower Extremity Assessment: Defer to PT evaluation     ADLs   Overall ADL's : Needs assistance/impaired Eating/Feeding: Moderate assistance, Sitting Eating/Feeding Details (indicate cue type and reason): practiced with built up spoon this session wtih RUE Grooming: Wash/dry face, Maximal assistance, Standing Grooming Details (indicate cue type and reason): pt required assist to grasp rag and bring to face, pt able to wipe in downwad motion. Need further assist for hand over hand to be thorough Upper Body Bathing: Maximal assistance, Bed level Upper Body Bathing Details (indicate cue type and reason): Had pt use LUE to bathe chest with hand over hand and while using RUE to dependently control LUE Lower Body Bathing: Maximal assistance Upper Body Dressing : Moderate assistance Lower Body Dressing: Maximal assistance Toilet Transfer: Maximal assistance, +2 for physical assistance, +2 for safety/equipment Toilet Transfer Details (indicate cue type and reason): use of stedy Toileting- Clothing Manipulation and Hygiene: Total assistance, Bed level Toileting - Clothing Manipulation Details (indicate cue type and reason): complete peri hygiene at bed level in sidelying position, pt able to sustain sidelying while holding bed rail Functional mobility during ADLs: Moderate assistance (bed level only this session) General ADL Comments: pt with improved awareness and attention to LUE; ataxic movements throughout. Pt in great spirits, singing and joking thourghout session     Mobility   Overal bed mobility: Needs Assistance Bed Mobility: Rolling, Sidelying to Sit Rolling: Mod assist Sidelying to sit: Mod assist, HOB elevated, +2 for safety/equipment Supine to sit: +2 for physical assistance, Mod assist, +2 for safety/equipment, HOB  elevated Sit to supine: Max assist, +2 for physical assistance, +2 for safety/equipment Sit to sidelying: Mod assist General bed mobility comments: mod A to roll with verbal cues and increased time to grasp bed rail and release, HOB slightly elevated to assist with transition, assist with BLE management     Transfers   Overall transfer level: Needs assistance Equipment used:  (EVA walker) Transfers: Sit to/from Stand Sit to Stand: Mod assist, +2 physical assistance General transfer comment: deferred today due to abdominal pain and pt requesting to lie back down     Ambulation / Gait / Stairs / Emergency planning/management officer  Ambulation/Gait Pre-gait activities: Sidesteps along EOB x 3-4 with +2 mod to maxA for sidesteps     Posture / Balance Dynamic Sitting Balance Sitting balance - Comments: Initially required constant min assist to sit EOB with cues to lean forward, with weight shifting able to sit upright with minG assist Balance Overall balance assessment: Needs assistance Sitting-balance support: Feet supported Sitting balance-Leahy Scale: Fair Sitting balance - Comments: Initially required constant min assist to sit EOB with cues to lean forward, with weight shifting able to sit upright with minG assist Postural control: Posterior lean Standing balance support: Bilateral upper extremity supported, Reliant on assistive device for balance Standing balance-Leahy Scale: Poor Standing balance comment: deferred today due to abdominal pain     Special needs/care consideration Skin Right groin dressing and Behavioral consideration tearful and anxious.    Previous Home Environment (from acute therapy documentation) Additional Comments: lives at sister house and working at [employer] but due to expressive deficits unable to fully obtain PTA living arrangements. pt becoming liable   Discharge Living Setting Plans for Discharge Living Setting: House, Lives with (comment) (Lives with his sister.) Type  of Home at Discharge: House Discharge Home Layout: One level Discharge Home Access: Level entry Discharge Bathroom Shower/Tub: Walk-in shower, Curtain Discharge Bathroom Toilet: Standard Discharge Bathroom Accessibility: Yes How Accessible: Accessible via walker Does the patient have any problems obtaining your medications?: Yes (Describe) (He has no insurance.  Aunt says they are applying for medicaid.)   Social/Family/Support Systems Patient Roles: Other (Comment) (Has 2 aunts, girlfriend, sister, other family) Contact Information: Nicky Pugh - aunt - [phone] and Charlesetta Garibaldi - aunt - [phone] Anticipated Caregiver: Sister, grilfriend and others Anticipated Caregiver's Contact Information: Princella Ion - girlfriend - [phone] Ability/Limitations of Caregiver: sister is not currently working.  Has 2 aunts and other family to assist after discharge. Caregiver Availability: 24/7 Discharge Plan Discussed with Primary Caregiver: Yes Is Caregiver In Agreement with Plan?: Yes Does Caregiver/Family have Issues with Lodging/Transportation while Pt is in Rehab?: No   Goals Patient/Family Goal for Rehab: PT/OT/SLP supervision to minguard assist goals Expected length of stay: 14-18 days Pt/Family Agrees to Admission and willing to participate: Yes Program Orientation Provided & Reviewed with Pt/Caregiver Including Roles  & Responsibilities: Yes   Decrease burden of Care through IP rehab admission: N/A   Possible need for SNF placement upon discharge: Not planned   Patient Condition: I have reviewed medical records from [facility], spoken with CM, and patient and family member. I met with patient at the bedside for inpatient rehabilitation assessment.  Patient will benefit from ongoing PT, OT, and SLP, can actively participate in 3 hours of therapy a day 5 days of the week, and can make measurable gains during the admission.  Patient will also benefit from the coordinated team  approach during an Inpatient Acute Rehabilitation admission.  The patient will receive intensive therapy as well as Rehabilitation physician, nursing, social worker, and care management interventions.  Due to bladder management, bowel management, safety, skin/wound care, disease management, medication administration, pain management, and patient education the patient requires 24 hour a day rehabilitation nursing.  The patient is currently mod to max assist with mobility and basic ADLs.  Discharge setting and therapy post discharge at home with home health is anticipated.  Patient has agreed to participate in the Acute Inpatient Rehabilitation Program and will admit today.   Preadmission Screen Completed By:  Retta Diones, 08/05/2021 10:26 AM ______________________________________________________________________   Discussed status with  Dr. Dagoberto Ligas and Dr. Naaman Plummer on 08/05/21 at 0945 and received approval for admission today.   Admission Coordinator:  Retta Diones, RN, time 1026/Date 08/05/21    Assessment/Plan: Diagnosis: Does the need for close, 24 hr/day Medical supervision in concert with the patient's rehab needs make it unreasonable for this patient to be served in a less intensive setting? 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 Yes Does the patient require coordinated care of a physician, rehab nurse, PT, OT, and SLP to address physical and functional deficits in the context of the above medical diagnosis(es)? Yes Addressing deficits in the following areas: balance, endurance, locomotion, strength, transferring, bowel/bladder control, bathing, dressing,  feeding, grooming, toileting, cognition, speech, and language Can the patient actively participate in an intensive therapy program of at least 3 hrs of therapy 5 days a week? Yes The potential for patient to make measurable gains while on inpatient rehab is good Anticipated functional outcomes upon discharge from inpatient rehab: supervision and min assist PT, supervision and min assist OT, supervision and min assist SLP Estimated rehab length of stay to reach the above functional goals is: 14-18 days Anticipated discharge destination: Home 10. Overall Rehab/Functional Prognosis: good     MD Signature:           Revision History                                                        Note Details  Jan Fireman, MD File Time 08/05/2021 11:16 AM  Author Type Physician Status Signed  Last Editor Courtney Heys, MD Service CASE MANAGEMENT

## 2021-08-05 NOTE — Progress Notes (Signed)
Inpatient Rehabilitation Admission Medication Review by a Pharmacist  A complete drug regimen review was completed for this patient to identify any potential clinically significant medication issues.  High Risk Drug Classes Is patient taking? Indication by Medication  Antipsychotic Yes Seroquel for restlessness,agitation   Anticoagulant Yes Lovenox for VTE prophx.  Antibiotic No   Opioid No   Antiplatelet Yes ASA and Brilinta s/p CVA, stenting  Hypoglycemics/insulin No   Vasoactive Medication No   Chemotherapy No   Other No      Type of Medication Issue Identified Description of Issue Recommendation(s)  Drug Interaction(s) (clinically significant)     Duplicate Therapy     Allergy     No Medication Administration End Date     Incorrect Dose     Additional Drug Therapy Needed     Significant med changes from prior encounter (inform family/care partners about these prior to discharge).    Other       Clinically significant medication issues were identified that warrant physician communication and completion of prescribed/recommended actions by midnight of the next day:  No  Time spent performing this drug regimen review (minutes):   Aryn Kops S. Merilynn Finland, PharmD, BCPS Clinical Staff Pharmacist [email or web address] Pasty Spillers 08/05/2021 2:24 PM

## 2021-08-05 NOTE — TOC Transition Note (Signed)
Transition of Care [facility]) - CM/SW Discharge Note   Patient Details  Name: Roy [name] MRN: [id number] Date of Birth: [birth year]  [facility] CM/SW Contact:  Kermit Balo, RN Phone Number: 08/05/2021, 10:29 AM   Clinical Narrative:    Patient is discharging to CIR today. CM signing off.    Final next level of care: IP Rehab Facility Barriers to Discharge: No Barriers Identified   Patient Goals and CMS Choice        Discharge Placement                       Discharge Plan and Services                                     Social Determinants of Health (SDOH) Interventions     Readmission Risk Interventions No flowsheet data found.

## 2021-08-05 NOTE — H&P (Signed)
Physical Medicine and Rehabilitation Admission H&P    Chief Complaint  Patient presents with   Altered Mental Status  :due to stroke   HPI: Roy [name] is a 25 year old right-handed male with unremarkable past medical history on no prescription medications.  He does have a history of marijuana and alcohol use.  Per chart review he was living at his sister's house working at [employer].  He does have a girlfriend.  Presented 07/26/2021 unresponsive after having intercourse with his girlfriend requiring intubation for airway protection as well as noted seizure.  His fiance/girlfriend reports that patient had been having complaints of headache for 3 to 4 days.  Cranial CT scan negative.  CT angiogram of head and neck showed intimal irregularity with filling defect involving the proximal left vertebral artery, V1 segment concerning for acute arterial dissection.  Subtle irregular nonocclusive filling defect involving the distal basilar artery/basilar tip consistent with associated distal embolic disease.  Adjacent superior cerebellar arteries were not visualized.  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  Negative CT venogram no evidence for dural sinus thrombosis.  Cranial CT scan was again repeated showing no acute pathology.  He was initially loaded with Keppra.  EEG showed no seizure activity and Keppra has since been discontinued.  Admission chemistries potassium 2.7-3.0, glucose 112, WBC 19,000, lactic acid 4.4, alcohol level less than 10, ammonia level 58, urine drug screen positive for marijuana.  Patient did undergo distal embolization to top of the basilar status post IR with left LV stenting per interventional radiology.  Currently maintained on aspirin 81 mg daily as well as Brilinta 90 mg twice daily.  Subcutaneous Lovenox for DVT prophylaxis.  Initially with Cortrak feeding tube diet is  since been advanced to regular.  Bouts of restlessness agitation monitor closely on Seroquel nightly.  Therapy evaluations completed due to patient decreased functional mobility was admitted for a comprehensive rehab program.  Pt reports no pain; no "issues"- per staff, is incontinent of small amount of stool.  Had condom catheter- dark amber urine- was removed.    Review of Systems  Constitutional:  Negative for chills and fever.  HENT:  Negative for hearing loss.   Eyes:  Negative for blurred vision and double vision.  Respiratory:  Negative for cough and shortness of breath.   Cardiovascular:  Negative for chest pain, palpitations and leg swelling.  Gastrointestinal:  Positive for nausea and vomiting.  Genitourinary:  Negative for dysuria, flank pain and hematuria.  Musculoskeletal:  Positive for joint pain and myalgias.  Skin:  Negative for rash.  Neurological:  Positive for seizures and headaches.  All other systems reviewed and are negative. History reviewed. No pertinent past medical history. Past Surgical History:  Procedure Laterality Date   IR ANGIO EXTRACRAN SEL COM CAROTID INNOMINATE UNI R MOD SED  07/27/2021   IR CT HEAD LTD  07/27/2021   IR INTRA CRAN STENT  07/27/2021   IR PERCUTANEOUS ART THROMBECTOMY/INFUSION INTRACRANIAL INC DIAG ANGIO  07/27/2021   RADIOLOGY WITH ANESTHESIA N/A 07/27/2021   Procedure: IR WITH ANESTHESIA;  Surgeon: Luanne Bras, MD;  Location: [facility];  Service: Radiology;  Laterality: N/A;   No family history on file. Social History:  has no history on file for tobacco use, alcohol use, and drug use. Allergies: No Known Allergies No medications prior to admission.    Drug Regimen Review Drug regimen was reviewed and remains appropriate  with no significant issues identified  Home: Home Living Family/patient expects to be discharged to:: Private residence Additional Comments: lives at sister house and working at [employer] but due to expressive  deficits unable to fully obtain PTA living arrangements. pt becoming liable   Functional History: Prior Function Prior Level of Function : Working/employed  Functional Status:  Mobility: Bed Mobility Overal bed mobility: Needs Assistance Bed Mobility: Rolling, Sidelying to Sit Rolling: Mod assist Sidelying to sit: Mod assist, HOB elevated, +2 for safety/equipment Supine to sit: +2 for physical assistance, Mod assist, +2 for safety/equipment, HOB elevated Sit to supine: Max assist, +2 for physical assistance, +2 for safety/equipment Sit to sidelying: Mod assist General bed mobility comments: mod A to roll with verbal cues and increased time to grasp bed rail and release, HOB slightly elevated to assist with transition, assist with BLE management Transfers Overall transfer level: Needs assistance Equipment used:  (EVA walker) Transfers: Sit to/from Stand Sit to Stand: Mod assist, +2 physical assistance General transfer comment: deferred today due to abdominal pain and pt requesting to lie back down Ambulation/Gait Pre-gait activities: Sidesteps along EOB x 3-4 with +2 mod to maxA for sidesteps    ADL: ADL Overall ADL's : Needs assistance/impaired Eating/Feeding: Moderate assistance, Sitting Eating/Feeding Details (indicate cue type and reason): practiced with built up spoon this session wtih RUE Grooming: Wash/dry face, Maximal assistance, Standing Grooming Details (indicate cue type and reason): pt required assist to grasp rag and bring to face, pt able to wipe in downwad motion. Need further assist for hand over hand to be thorough Upper Body Bathing: Maximal assistance, Bed level Upper Body Bathing Details (indicate cue type and reason): Had pt use LUE to bathe chest with hand over hand and while using RUE to dependently control LUE Lower Body Bathing: Maximal assistance Upper Body Dressing : Moderate assistance Lower Body Dressing: Maximal assistance Toilet Transfer: Maximal  assistance, +2 for physical assistance, +2 for safety/equipment Toilet Transfer Details (indicate cue type and reason): use of stedy Toileting- Clothing Manipulation and Hygiene: Total assistance, Bed level Toileting - Clothing Manipulation Details (indicate cue type and reason): complete peri hygiene at bed level in sidelying position, pt able to sustain sidelying while holding bed rail Functional mobility during ADLs: Moderate assistance (bed level only this session) General ADL Comments: pt with improved awareness and attention to LUE; ataxic movements throughout. Pt in great spirits, singing and joking thourghout session  Cognition: Cognition Overall Cognitive Status: Impaired/Different from baseline Arousal/Alertness: Awake/alert Orientation Level: Oriented to person Month: October Attention: Sustained Sustained Attention: Impaired Sustained Attention Impairment: Verbal basic Memory: Impaired Awareness: Impaired Awareness Impairment: Anticipatory impairment, Intellectual impairment Problem Solving: Appears intact Executive Function: Self Monitoring, Self Correcting Self Monitoring: Appears intact Self Correcting: Appears intact Cognition Arousal/Alertness: Awake/alert Behavior During Therapy: WFL for tasks assessed/performed Overall Cognitive Status: Impaired/Different from baseline Area of Impairment: Attention, Memory, Following commands, Awareness, Problem solving Orientation Level: Disoriented to, Place (said we are in the shed) Current Attention Level: Sustained Following Commands: Follows one step commands consistently, Follows one step commands with increased time Safety/Judgement: Decreased awareness of safety, Decreased awareness of deficits Awareness: Intellectual Problem Solving: Slow processing, Difficulty sequencing, Requires tactile cues, Requires verbal cues General Comments: Pt follows one step commands consistently with increased time; can be slightly  impulsive  Physical Exam: Blood pressure 129/78, pulse 87, temperature 98.6 F (37 C), temperature source Oral, resp. rate 16, height 5\' 2"  (1.575 m), weight 97.7 kg, SpO2 98 %. Physical Exam Vitals  and nursing note reviewed. Exam conducted with a chaperone present.  Constitutional:      Comments: Pt obese young gentleman sitting up in bed in 4W; flirting with staff; NAD  HENT:     Head: Normocephalic.     Comments: R facial droop- tongue midline    Right Ear: External ear normal.     Left Ear: External ear normal.     Nose: Nose normal. No congestion.     Mouth/Throat:     Mouth: Mucous membranes are dry.     Pharynx: Oropharynx is clear. No oropharyngeal exudate.  Eyes:     General:        Right eye: No discharge.        Left eye: No discharge.     Comments: Dysconjugate gaze L lens on eyeglasses taped due to diplopia  Cardiovascular:     Rate and Rhythm: Regular rhythm. Tachycardia present.     Heart sounds: Normal heart sounds. No murmur heard.   No gallop.  Pulmonary:     Effort: Pulmonary effort is normal. No respiratory distress.     Breath sounds: Normal breath sounds. No wheezing, rhonchi or rales.  Abdominal:     Comments: Incontinent of stool- soft, NT, ND, (+)BS  Genitourinary:    Comments: Condom catheter removed Musculoskeletal:     Cervical back: Normal range of motion. No rigidity.     Comments: 4-/5 in UE B/L -palms really moist B/L HF 3/5; KE 3+/5; DF/PF 4-/5- hard to get pt to participate in exam  Skin:    Comments: Palms moist- no skin breakdown seen on buttocks or heels- IV out  Neurological:     Mental Status: He is disoriented.     Comments: Patient is alert.  Makes eye contact with examiner.  Speech is a bit dysarthric but intelligible.  Follows commands.  Provides name and age he had to self correct month.  He could not recall his full hospital event. Very dysarthric- but can understand most of what he says; some word finding issues? Restless, not  agitated Intact to light touch in all 4 extremities per pt.   Psychiatric:     Comments: Having fun- restless, but playing with nursing staff    Results for orders placed or performed during the hospital encounter of 07/26/21 (from the past 48 hour(s))  Glucose, capillary     Status: Abnormal   Collection Time: 08/03/21 12:32 PM  Result Value Ref Range   Glucose-Capillary 101 (H) 70 - 99 mg/dL    Comment: Glucose reference range applies only to samples taken after fasting for at least 8 hours.  Urinalysis, Routine w reflex microscopic Urine, Clean Catch     Status: Abnormal   Collection Time: 08/03/21  4:54 PM  Result Value Ref Range   Color, Urine AMBER (A) YELLOW    Comment: BIOCHEMICALS MAY BE AFFECTED BY COLOR   APPearance HAZY (A) CLEAR   Specific Gravity, Urine 1.035 (H) 1.005 - 1.030   pH 5.0 5.0 - 8.0   Glucose, UA NEGATIVE NEGATIVE mg/dL   Hgb urine dipstick NEGATIVE NEGATIVE   Bilirubin Urine NEGATIVE NEGATIVE   Ketones, ur NEGATIVE NEGATIVE mg/dL   Protein, ur NEGATIVE NEGATIVE mg/dL   Nitrite NEGATIVE NEGATIVE   Leukocytes,Ua NEGATIVE NEGATIVE    Comment: Performed at [facility] [address], [place], [place] [address]  Glucose, capillary     Status: Abnormal   Collection Time: 08/03/21  4:58 PM  Result  Value Ref Range   Glucose-Capillary 117 (H) 70 - 99 mg/dL    Comment: Glucose reference range applies only to samples taken after fasting for at least 8 hours.  Glucose, capillary     Status: Abnormal   Collection Time: 08/03/21  9:19 PM  Result Value Ref Range   Glucose-Capillary 117 (H) 70 - 99 mg/dL    Comment: Glucose reference range applies only to samples taken after fasting for at least 8 hours.   Comment 1 Notify RN    Comment 2 Document in Chart   Magnesium     Status: None   Collection Time: 08/04/21  1:32 AM  Result Value Ref Range   Magnesium 2.2 1.7 - 2.4 mg/dL    Comment: Performed at [facility], [address] [place],  [place] [address]  CBC     Status: Abnormal   Collection Time: 08/04/21  1:32 AM  Result Value Ref Range   WBC 12.9 (H) 4.0 - 10.5 K/uL   RBC 4.26 4.22 - 5.81 MIL/uL   Hemoglobin 13.2 13.0 - 17.0 g/dL   HCT 02.5 (L) 42.7 - 06.2 %   MCV 89.0 80.0 - 100.0 fL   MCH 31.0 26.0 - 34.0 pg   MCHC 34.8 30.0 - 36.0 g/dL   RDW 37.6 28.3 - 15.1 %   Platelets 508 (H) 150 - 400 K/uL   nRBC 0.0 0.0 - 0.2 %    Comment: Performed at [facility], [address], [place], [place] [address]  Basic metabolic panel     Status: Abnormal   Collection Time: 08/04/21  1:32 AM  Result Value Ref Range   Sodium 130 (L) 135 - 145 mmol/L   Potassium 3.9 3.5 - 5.1 mmol/L   Chloride 96 (L) 98 - 111 mmol/L   CO2 22 22 - 32 mmol/L   Glucose, Bld 96 70 - 99 mg/dL    Comment: Glucose reference range applies only to samples taken after fasting for at least 8 hours.   BUN 17 6 - 20 mg/dL   Creatinine, Ser 7.37 0.61 - 1.24 mg/dL   Calcium 9.3 8.9 - 10.6 mg/dL   GFR, Estimated >26 >94 mL/min    Comment: (NOTE) Calculated using the CKD-EPI Creatinine Equation (2021)    Anion gap 12 5 - 15    Comment: Performed at [facility], [address] [place], [place] [address]  Glucose, capillary     Status: Abnormal   Collection Time: 08/04/21  7:02 AM  Result Value Ref Range   Glucose-Capillary 101 (H) 70 - 99 mg/dL    Comment: Glucose reference range applies only to samples taken after fasting for at least 8 hours.  Glucose, capillary     Status: Abnormal   Collection Time: 08/04/21 12:39 PM  Result Value Ref Range   Glucose-Capillary 105 (H) 70 - 99 mg/dL    Comment: Glucose reference range applies only to samples taken after fasting for at least 8 hours.  Glucose, capillary     Status: Abnormal   Collection Time: 08/04/21  4:29 PM  Result Value Ref Range   Glucose-Capillary 114 (H) 70 - 99 mg/dL    Comment: Glucose reference range applies only to samples taken after fasting for at least 8 hours.  Glucose,  capillary     Status: Abnormal   Collection Time: 08/04/21  9:25 PM  Result Value Ref Range   Glucose-Capillary 107 (H) 70 - 99 mg/dL  Comment: Glucose reference range applies only to samples taken after fasting for at least 8 hours.   Comment 1 Notify RN    Comment 2 Document in Chart   Magnesium     Status: None   Collection Time: 08/05/21  2:15 AM  Result Value Ref Range   Magnesium 2.2 1.7 - 2.4 mg/dL    Comment: Performed at [facility], [place] [address] [place], [place] [address]  CBC     Status: Abnormal   Collection Time: 08/05/21  2:15 AM  Result Value Ref Range   WBC 12.0 (H) 4.0 - 10.5 K/uL   RBC 4.23 4.22 - 5.81 MIL/uL   Hemoglobin 12.6 (L) 13.0 - 17.0 g/dL   HCT 38.0 (L) 39.0 - 52.0 %   MCV 89.8 80.0 - 100.0 fL   MCH 29.8 26.0 - 34.0 pg   MCHC 33.2 30.0 - 36.0 g/dL   RDW 12.8 11.5 - 15.5 %   Platelets 502 (H) 150 - 400 K/uL   nRBC 0.0 0.0 - 0.2 %    Comment: Performed at [facility], [place] [address], [place], [place] [id number]  Basic metabolic panel     Status: Abnormal   Collection Time: 08/05/21  2:15 AM  Result Value Ref Range   Sodium 132 (L) 135 - 145 mmol/L   Potassium 4.2 3.5 - 5.1 mmol/L   Chloride 98 98 - 111 mmol/L   CO2 22 22 - 32 mmol/L   Glucose, Bld 98 70 - 99 mg/dL    Comment: Glucose reference range applies only to samples taken after fasting for at least 8 hours.   BUN 17 6 - 20 mg/dL   Creatinine, Ser 0.88 0.61 - 1.24 mg/dL   Calcium 9.5 8.9 - 10.3 mg/dL   GFR, Estimated >60 >60 mL/min    Comment: (NOTE) Calculated using the CKD-EPI Creatinine Equation (2021)    Anion gap 12 5 - 15    Comment: Performed at [facility] [address] [place], [place] [address]  Glucose, capillary     Status: None   Collection Time: 08/05/21  6:20 AM  Result Value Ref Range   Glucose-Capillary 99 70 - 99 mg/dL    Comment: Glucose reference range applies only to samples taken after fasting for at least 8 hours.   Comment 1 Notify RN     Comment 2 Document in Chart    DG CHEST PORT 1 VIEW  Result Date: 08/03/2021 CLINICAL DATA:  Seizure, possible infection EXAM: PORTABLE CHEST 1 VIEW COMPARISON:  None. FINDINGS: The heart size and mediastinal contours are within normal limits. Both lungs are clear. No pleural effusion. The visualized skeletal structures are unremarkable. IMPRESSION: No acute process in the chest. Electronically Signed   By: Macy Mis M.D.   On: 08/03/2021 18:10       Medical Problem List and Plan: 1.  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  Antithrombotics: -DVT/anticoagulation:  Pharmaceutical: Lovenox  -antiplatelet therapy: Aspirin 81 mg daily and Brilinta 90 mg twice daily 3. Pain Management: Tylenol as needed 4. Mood: Zoloft 100 mg daily, melatonin 3 mg nightly  -antipsychotic agents: Seroquel 50 mg nightly for agitation 5. Neuropsych: This patient is capable of making decisions on his own behalf. 6. Skin/Wound Care:  Routine skin checks 7. Fluids/Electrolytes/Nutrition: Routine in and outs with follow-up chemistries 8.  Hyperlipidemia.  Lipitor 9.  History of alcohol and marijuana use.  Urine drug screen positive marijuana.  Counseling 10. Morbid Obesity- BMI 39.52- counseling and diet education   I have personally performed a face to face diagnostic evaluation of this patient and formulated the key components of the plan.  Additionally, I have personally reviewed laboratory data, imaging studies, as well as relevant notes and concur with the physician assistant's documentation above.   The patient's status has not changed from the original H&P.  Any changes in documentation from the acute care chart have been noted above.     Lavon Paganini  Angiulli, PA-C 08/05/2021

## 2021-08-05 NOTE — H&P (Addendum)
Physical Medicine and Rehabilitation Admission H&P        Chief Complaint  Patient presents with   Altered Mental Status  :due to stroke     HPI: Roy [name] is a 25 year old right-handed male with unremarkable past medical history on no prescription medications.  He does have a history of marijuana and alcohol use.  Per chart review he was living at his sister's house working at [employer].  He does have a girlfriend.  Presented 07/26/2021 unresponsive after having intercourse with his girlfriend requiring intubation for airway protection as well as noted seizure.  His fiance/girlfriend reports that patient had been having complaints of headache for 3 to 4 days.  Cranial CT scan negative.  CT angiogram of head and neck showed intimal irregularity with filling defect involving the proximal left vertebral artery, V1 segment concerning for acute arterial dissection.  Subtle irregular nonocclusive filling defect involving the distal basilar artery/basilar tip consistent with associated distal embolic disease.  Adjacent superior cerebellar arteries were not visualized.  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  Negative CT venogram no evidence for dural sinus thrombosis.  Cranial CT scan was again repeated showing no acute pathology.  He was initially loaded with Keppra.  EEG showed no seizure activity and Keppra has since been discontinued.  Admission chemistries potassium 2.7-3.0, glucose 112, WBC 19,000, lactic acid 4.4, alcohol level less than 10, ammonia level 58, urine drug screen positive for marijuana.  Patient did undergo distal embolization to top of the basilar status post IR with left LV stenting per interventional radiology.  Currently maintained on aspirin 81 mg daily as well as Brilinta 90 mg twice daily.  Subcutaneous Lovenox for DVT prophylaxis.  Initially with Cortrak feeding tube diet  is since been advanced to regular.  Bouts of restlessness agitation monitor closely on Seroquel nightly.  Therapy evaluations completed due to patient decreased functional mobility was admitted for a comprehensive rehab program.   Pt reports no pain; no "issues"- per staff, is incontinent of small amount of stool.  Had condom catheter- dark amber urine- was removed.      Review of Systems  Constitutional:  Negative for chills and fever.  HENT:  Negative for hearing loss.   Eyes:  Negative for blurred vision and double vision.  Respiratory:  Negative for cough and shortness of breath.   Cardiovascular:  Negative for chest pain, palpitations and leg swelling.  Gastrointestinal:  Positive for nausea and vomiting.  Genitourinary:  Negative for dysuria, flank pain and hematuria.  Musculoskeletal:  Positive for joint pain and myalgias.  Skin:  Negative for rash.  Neurological:  Positive for seizures and headaches.  All other systems reviewed and are negative. History reviewed. No pertinent past medical history.      Past Surgical History:  Procedure Laterality Date   IR ANGIO EXTRACRAN SEL COM CAROTID INNOMINATE UNI R MOD SED   07/27/2021   IR CT HEAD LTD   07/27/2021   IR INTRA CRAN STENT   07/27/2021   IR PERCUTANEOUS ART THROMBECTOMY/INFUSION INTRACRANIAL INC DIAG ANGIO   07/27/2021   RADIOLOGY WITH ANESTHESIA N/A 07/27/2021    Procedure: IR WITH ANESTHESIA;  Surgeon: Luanne Bras, MD;  Location: [facility];  Service: Radiology;  Laterality: N/A;    No family history on file. Social History:  has no history on file for tobacco use, alcohol use, and drug use. Allergies: No  Known Allergies No medications prior to admission.      Drug Regimen Review Drug regimen was reviewed and remains appropriate with no significant issues identified   Home: Home Living Family/patient expects to be discharged to:: Private residence Additional Comments: lives at sister house and working at [employer] but  due to expressive deficits unable to fully obtain PTA living arrangements. pt becoming liable   Functional History: Prior Function Prior Level of Function : Working/employed   Functional Status:  Mobility: Bed Mobility Overal bed mobility: Needs Assistance Bed Mobility: Rolling, Sidelying to Sit Rolling: Mod assist Sidelying to sit: Mod assist, HOB elevated, +2 for safety/equipment Supine to sit: +2 for physical assistance, Mod assist, +2 for safety/equipment, HOB elevated Sit to supine: Max assist, +2 for physical assistance, +2 for safety/equipment Sit to sidelying: Mod assist General bed mobility comments: mod A to roll with verbal cues and increased time to grasp bed rail and release, HOB slightly elevated to assist with transition, assist with BLE management Transfers Overall transfer level: Needs assistance Equipment used:  (EVA walker) Transfers: Sit to/from Stand Sit to Stand: Mod assist, +2 physical assistance General transfer comment: deferred today due to abdominal pain and pt requesting to lie back down Ambulation/Gait Pre-gait activities: Sidesteps along EOB x 3-4 with +2 mod to maxA for sidesteps   ADL: ADL Overall ADL's : Needs assistance/impaired Eating/Feeding: Moderate assistance, Sitting Eating/Feeding Details (indicate cue type and reason): practiced with built up spoon this session wtih RUE Grooming: Wash/dry face, Maximal assistance, Standing Grooming Details (indicate cue type and reason): pt required assist to grasp rag and bring to face, pt able to wipe in downwad motion. Need further assist for hand over hand to be thorough Upper Body Bathing: Maximal assistance, Bed level Upper Body Bathing Details (indicate cue type and reason): Had pt use LUE to bathe chest with hand over hand and while using RUE to dependently control LUE Lower Body Bathing: Maximal assistance Upper Body Dressing : Moderate assistance Lower Body Dressing: Maximal assistance Toilet  Transfer: Maximal assistance, +2 for physical assistance, +2 for safety/equipment Toilet Transfer Details (indicate cue type and reason): use of stedy Toileting- Clothing Manipulation and Hygiene: Total assistance, Bed level Toileting - Clothing Manipulation Details (indicate cue type and reason): complete peri hygiene at bed level in sidelying position, pt able to sustain sidelying while holding bed rail Functional mobility during ADLs: Moderate assistance (bed level only this session) General ADL Comments: pt with improved awareness and attention to LUE; ataxic movements throughout. Pt in great spirits, singing and joking thourghout session   Cognition: Cognition Overall Cognitive Status: Impaired/Different from baseline Arousal/Alertness: Awake/alert Orientation Level: Oriented to person Month: October Attention: Sustained Sustained Attention: Impaired Sustained Attention Impairment: Verbal basic Memory: Impaired Awareness: Impaired Awareness Impairment: Anticipatory impairment, Intellectual impairment Problem Solving: Appears intact Executive Function: Self Monitoring, Self Correcting Self Monitoring: Appears intact Self Correcting: Appears intact Cognition Arousal/Alertness: Awake/alert Behavior During Therapy: WFL for tasks assessed/performed Overall Cognitive Status: Impaired/Different from baseline Area of Impairment: Attention, Memory, Following commands, Awareness, Problem solving Orientation Level: Disoriented to, Place (said we are in the shed) Current Attention Level: Sustained Following Commands: Follows one step commands consistently, Follows one step commands with increased time Safety/Judgement: Decreased awareness of safety, Decreased awareness of deficits Awareness: Intellectual Problem Solving: Slow processing, Difficulty sequencing, Requires tactile cues, Requires verbal cues General Comments: Pt follows one step commands consistently with increased time; can be  slightly impulsive   Physical Exam: Blood pressure 129/78, pulse 87,  temperature 98.6 F (37 C), temperature source Oral, resp. rate 16, height 5\' 2"  (1.575 m), weight 97.7 kg, SpO2 98 %. Physical Exam Vitals and nursing note reviewed. Exam conducted with a chaperone present.  Constitutional:      Comments: Pt obese young gentleman sitting up in bed in 4W; flirting with staff; NAD  HENT:     Head: Normocephalic.     Comments: R facial droop- tongue midline    Right Ear: External ear normal.     Left Ear: External ear normal.     Nose: Nose normal. No congestion.     Mouth/Throat:     Mouth: Mucous membranes are dry.     Pharynx: Oropharynx is clear. No oropharyngeal exudate.  Eyes:     General:        Right eye: No discharge.        Left eye: No discharge.     Comments: Dysconjugate gaze L lens on eyeglasses taped due to diplopia  Cardiovascular:     Rate and Rhythm: Regular rhythm. Tachycardia present.     Heart sounds: Normal heart sounds. No murmur heard.   No gallop.  Pulmonary:     Effort: Pulmonary effort is normal. No respiratory distress.     Breath sounds: Normal breath sounds. No wheezing, rhonchi or rales.  Abdominal:     Comments: Incontinent of stool- soft, NT, ND, (+)BS  Genitourinary:    Comments: Condom catheter removed Musculoskeletal:     Cervical back: Normal range of motion. No rigidity.     Comments: 4-/5 in UE B/L -palms really moist B/L HF 3/5; KE 3+/5; DF/PF 4-/5- hard to get pt to participate in exam  Skin:    Comments: Palms moist- no skin breakdown seen on buttocks or heels- IV out  Neurological:     Mental Status: He is disoriented.     Comments: Patient is alert.  Makes eye contact with examiner.  Speech is a bit dysarthric but intelligible.  Follows commands.  Provides name and age he had to self correct month.  He could not recall his full hospital event. Very dysarthric- but can understand most of what he says; some word finding issues?  Restless, not agitated Intact to light touch in all 4 extremities per pt.   Psychiatric:     Comments: Having fun- restless, but playing with nursing staff      Lab Results Last 48 Hours        Results for orders placed or performed during the hospital encounter of 07/26/21 (from the past 48 hour(s))  Glucose, capillary     Status: Abnormal    Collection Time: 08/03/21 12:32 PM  Result Value Ref Range    Glucose-Capillary 101 (H) 70 - 99 mg/dL      Comment: Glucose reference range applies only to samples taken after fasting for at least 8 hours.  Urinalysis, Routine w reflex microscopic Urine, Clean Catch     Status: Abnormal    Collection Time: 08/03/21  4:54 PM  Result Value Ref Range    Color, Urine AMBER (A) YELLOW      Comment: BIOCHEMICALS MAY BE AFFECTED BY COLOR    APPearance HAZY (A) CLEAR    Specific Gravity, Urine 1.035 (H) 1.005 - 1.030    pH 5.0 5.0 - 8.0    Glucose, UA NEGATIVE NEGATIVE mg/dL    Hgb urine dipstick NEGATIVE NEGATIVE    Bilirubin Urine NEGATIVE NEGATIVE    Ketones, ur NEGATIVE NEGATIVE  mg/dL    Protein, ur NEGATIVE NEGATIVE mg/dL    Nitrite NEGATIVE NEGATIVE    Leukocytes,Ua NEGATIVE NEGATIVE      Comment: Performed at [facility] [address], [place], [place] [address]  Glucose, capillary     Status: Abnormal    Collection Time: 08/03/21  4:58 PM  Result Value Ref Range    Glucose-Capillary 117 (H) 70 - 99 mg/dL      Comment: Glucose reference range applies only to samples taken after fasting for at least 8 hours.  Glucose, capillary     Status: Abnormal    Collection Time: 08/03/21  9:19 PM  Result Value Ref Range    Glucose-Capillary 117 (H) 70 - 99 mg/dL      Comment: Glucose reference range applies only to samples taken after fasting for at least 8 hours.    Comment 1 Notify RN      Comment 2 Document in Chart    Magnesium     Status: None    Collection Time: 08/04/21  1:32 AM  Result Value Ref Range    Magnesium 2.2 1.7 - 2.4  mg/dL      Comment: Performed at [facility], [place] [address] [place], [place] [address]  CBC     Status: Abnormal    Collection Time: 08/04/21  1:32 AM  Result Value Ref Range    WBC 12.9 (H) 4.0 - 10.5 K/uL    RBC 4.26 4.22 - 5.81 MIL/uL    Hemoglobin 13.2 13.0 - 17.0 g/dL    HCT 37.9 (L) 39.0 - 52.0 %    MCV 89.0 80.0 - 100.0 fL    MCH 31.0 26.0 - 34.0 pg    MCHC 34.8 30.0 - 36.0 g/dL    RDW 12.9 11.5 - 15.5 %    Platelets 508 (H) 150 - 400 K/uL    nRBC 0.0 0.0 - 0.2 %      Comment: Performed at [facility], [place] [address] [place], [place] [id number]  Basic metabolic panel     Status: Abnormal    Collection Time: 08/04/21  1:32 AM  Result Value Ref Range    Sodium 130 (L) 135 - 145 mmol/L    Potassium 3.9 3.5 - 5.1 mmol/L    Chloride 96 (L) 98 - 111 mmol/L    CO2 22 22 - 32 mmol/L    Glucose, Bld 96 70 - 99 mg/dL      Comment: Glucose reference range applies only to samples taken after fasting for at least 8 hours.    BUN 17 6 - 20 mg/dL    Creatinine, Ser 0.87 0.61 - 1.24 mg/dL    Calcium 9.3 8.9 - 10.3 mg/dL    GFR, Estimated >60 >60 mL/min      Comment: (NOTE) Calculated using the CKD-EPI Creatinine Equation (2021)      Anion gap 12 5 - 15      Comment: Performed at [facility] [address], [place], [place] [address]  Glucose, capillary     Status: Abnormal    Collection Time: 08/04/21  7:02 AM  Result Value Ref Range    Glucose-Capillary 101 (H) 70 - 99 mg/dL      Comment: Glucose reference range applies only to samples taken after fasting for at least 8 hours.  Glucose, capillary     Status: Abnormal    Collection Time: 08/04/21 12:39 PM  Result Value Ref  Range    Glucose-Capillary 105 (H) 70 - 99 mg/dL      Comment: Glucose reference range applies only to samples taken after fasting for at least 8 hours.  Glucose, capillary     Status: Abnormal    Collection Time: 08/04/21  4:29 PM  Result Value Ref Range    Glucose-Capillary 114 (H) 70  - 99 mg/dL      Comment: Glucose reference range applies only to samples taken after fasting for at least 8 hours.  Glucose, capillary     Status: Abnormal    Collection Time: 08/04/21  9:25 PM  Result Value Ref Range    Glucose-Capillary 107 (H) 70 - 99 mg/dL      Comment: Glucose reference range applies only to samples taken after fasting for at least 8 hours.    Comment 1 Notify RN      Comment 2 Document in Chart    Magnesium     Status: None    Collection Time: 08/05/21  2:15 AM  Result Value Ref Range    Magnesium 2.2 1.7 - 2.4 mg/dL      Comment: Performed at [facility], [address] [place], [place] [address]  CBC     Status: Abnormal    Collection Time: 08/05/21  2:15 AM  Result Value Ref Range    WBC 12.0 (H) 4.0 - 10.5 K/uL    RBC 4.23 4.22 - 5.81 MIL/uL    Hemoglobin 12.6 (L) 13.0 - 17.0 g/dL    HCT 60.4 (L) 54.0 - 52.0 %    MCV 89.8 80.0 - 100.0 fL    MCH 29.8 26.0 - 34.0 pg    MCHC 33.2 30.0 - 36.0 g/dL    RDW 98.1 19.1 - 47.8 %    Platelets 502 (H) 150 - 400 K/uL    nRBC 0.0 0.0 - 0.2 %      Comment: Performed at [facility], [address], [place], [place] [address]  Basic metabolic panel     Status: Abnormal    Collection Time: 08/05/21  2:15 AM  Result Value Ref Range    Sodium 132 (L) 135 - 145 mmol/L    Potassium 4.2 3.5 - 5.1 mmol/L    Chloride 98 98 - 111 mmol/L    CO2 22 22 - 32 mmol/L    Glucose, Bld 98 70 - 99 mg/dL      Comment: Glucose reference range applies only to samples taken after fasting for at least 8 hours.    BUN 17 6 - 20 mg/dL    Creatinine, Ser 1.30 0.61 - 1.24 mg/dL    Calcium 9.5 8.9 - 86.5 mg/dL    GFR, Estimated >78 >46 mL/min      Comment: (NOTE) Calculated using the CKD-EPI Creatinine Equation (2021)      Anion gap 12 5 - 15      Comment: Performed at [facility], [address] [place], [place] [address]  Glucose, capillary     Status: None    Collection Time: 08/05/21  6:20 AM  Result Value Ref  Range    Glucose-Capillary 99 70 - 99 mg/dL      Comment: Glucose reference range applies only to samples taken after fasting for at least 8 hours.    Comment 1 Notify RN      Comment 2 Document in Chart         Imaging Results (Last 48  hours)  DG CHEST PORT 1 VIEW   Result Date: 08/03/2021 CLINICAL DATA:  Seizure, possible infection EXAM: PORTABLE CHEST 1 VIEW COMPARISON:  None. FINDINGS: The heart size and mediastinal contours are within normal limits. Both lungs are clear. No pleural effusion. The visualized skeletal structures are unremarkable. IMPRESSION: No acute process in the chest. Electronically Signed   By: Macy Mis M.D.   On: 08/03/2021 18:10             Medical Problem List and Plan: 1.  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  Antithrombotics: -DVT/anticoagulation:  Pharmaceutical: Lovenox             -antiplatelet therapy: Aspirin 81 mg daily and Brilinta 90 mg twice daily 3. Pain Management: Tylenol as needed 4. Mood: Zoloft 100 mg daily, melatonin 3 mg nightly             -antipsychotic agents: Seroquel 50 mg nightly for agitation 5. Neuropsych: This patient is capable of making decisions on his own behalf. 6. Skin/Wound Care: Routine skin checks 7. Fluids/Electrolytes/Nutrition: Routine in and outs with follow-up chemistries 8.  Hyperlipidemia.  Lipitor 9.  History of alcohol and marijuana use.  Urine drug screen positive marijuana.  Counseling 10. Morbid Obesity- BMI 39.52- counseling and diet education     I have personally performed a face to face diagnostic evaluation of this patient and formulated the key components of the plan.  Additionally, I have personally reviewed  laboratory data, imaging studies, as well as relevant notes and concur with the physician assistant's documentation above.   The patient's status has not changed from the original H&P.  Any changes in documentation from the acute care chart have been noted above.       Lavon Paganini Angiulli, PA-C 08/05/2021

## 2021-08-05 NOTE — Progress Notes (Signed)
Occupational Therapy Treatment Patient Details Name: Roy [name] MRN: [id number] DOB: Feb 24, 1996 Today's Date: 08/05/2021   History of present illness Pt is 25 yo male who presents seizures while smoking marijuana and drinking EtOH with girlfriend at home. 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 MRI - Acute infarcts involving the right thalamus, medial aspect of the  right occipital and temporal lobes, including right hippocampus,  centrally in the midbrain, on the left side of the pons and in the  bilateral cerebellar hemispheres. No significant PMH.   OT comments  Excellent session. Pt with horizontal diplopia and appears to be R eye dominant. L nasal field partially occluded and pt now singing "I can see clearly now". Significant sensory motor impairment with apraxic LUE however movement patterns in BUE has improved and pt attempting to use functionally. Movement patterns improve significantly with repetition, facilitation, visual feedback and use of VC for "slow easy movement". Anticipate DC to CIR for intensive rehab.    Recommendations for follow up therapy are one component of a multi-disciplinary discharge planning process, led by the attending physician.  Recommendations may be updated based on patient status, additional functional criteria and insurance authorization.    Follow Up Recommendations  Acute inpatient rehab (3hours/day)    Assistance Recommended at Discharge Intermittent Supervision/Assistance  Equipment Recommendations  BSC/3in1;Wheelchair (measurements OT);Wheelchair cushion (measurements OT)    Recommendations for Other Services Rehab consult    Precautions / Restrictions Precautions Precautions: Fall Precaution Comments: permissive HTN <220/120, R hemi, diplopia/visual deficits Restrictions Weight Bearing Restrictions: No       Mobility Bed  Mobility                    Transfers                   General transfer comment: focused on forward flexion adn anterior weight shift with controlled repetitive movement patterns; Pt able to come up to semi stand position wiht min A with facilitation     Balance     Sitting balance-Leahy Scale: Fair                                     ADL either performed or assessed with clinical judgement   ADL                                         General ADL Comments: focus on movement patterns and bed mobility    Extremity/Trunk Assessment Upper Extremity Assessment RUE Deficits / Details: RUE movemetn improved; gross AROM with active shoulder flexion@ 70; Able to complete full range with assist; gross grasp/release; better controlled movement with RUE as compared to L; movement improved with visual feedback adn VC for slow controlled movement RUE Coordination: decreased fine motor;decreased gross motor LUE Deficits / Details: [place] apraxic LUE; attempting to use functionally; proximal movement better than distal movement; movement quality improves with visual feedback and VC for slow controlled movement patterns LUE Coordination: decreased fine motor;decreased gross motor   Lower Extremity Assessment Lower Extremity Assessment: Defer to PT evaluation        Vision   Diplopia Assessment: Objects split side to side Additional Comments: L eye does not adduct past midline;  L lens partially occluded with diminished diplopia   Perception Perception Perception: Impaired   Praxis      Cognition Arousal/Alertness: Awake/alert Behavior During Therapy: WFL for tasks assessed/performed (entertaining/singing/joking) Overall Cognitive Status: Impaired/Different from baseline Area of Impairment: Attention;Safety/judgement;Awareness;Problem solving                   Current Attention Level: Selective   Following Commands: Follows  one step commands consistently Safety/Judgement: Decreased awareness of safety;Decreased awareness of deficits Awareness: Emergent Problem Solving: Slow processing            Exercises Other Exercises Other Exercises: PNF patterns with BUE in supine and sitting Other Exercises: rythmic stabilization of trunk in sitting Other Exercises: repetitive rolling to R side with untilmate min A with facilitation Other Exercises: repetitive sidelying to sit with pushing up through weak RUE with min A   Shoulder Instructions       General Comments      Pertinent Vitals/ Pain       Pain Assessment: No/denies pain  Home Living                                          Prior Functioning/Environment              Frequency  Min 2X/week        Progress Toward Goals  OT Goals(current goals can now be found in the care plan section)  Progress towards OT goals: Progressing toward goals  Acute Rehab OT Goals Patient Stated Goal: to go to rehab OT Goal Formulation: With patient/family Time For Goal Achievement: 08/11/21 Potential to Achieve Goals: Good ADL Goals Pt Will Perform Grooming: with mod assist;sitting Pt Will Perform Upper Body Bathing: with mod assist;sitting Pt Will Transfer to Toilet: with +2 assist;with mod assist;stand pivot transfer;bedside commode Additional ADL Goal #1: pt will follow 2 step command 50% of session  Plan Discharge plan remains appropriate    Co-evaluation                 AM-PAC OT "6 Clicks" Daily Activity     Outcome Measure   Help from another person eating meals?: A Lot Help from another person taking care of personal grooming?: A Lot Help from another person toileting, which includes using toliet, bedpan, or urinal?: A Lot Help from another person bathing (including washing, rinsing, drying)?: A Lot Help from another person to put on and taking off regular upper body clothing?: A Lot Help from another person to  put on and taking off regular lower body clothing?: A Lot 6 Click Score: 12    End of Session    OT Visit Diagnosis: Unsteadiness on feet (R26.81);Muscle weakness (generalized) (M62.81);Cognitive communication deficit (R41.841);Low vision, both eyes (H54.2);Apraxia (R48.2) Symptoms and signs involving cognitive functions: Cerebral infarction   Activity Tolerance Patient tolerated treatment well   Patient Left in bed;with call bell/phone within reach;with bed alarm set;with nursing/sitter in room (modified chair position)   Nurse Communication Mobility status;Other (comment) (partially occluded glasses to be wron for diplopia)        Time: 4818-5631 OT Time Calculation (min): 37 min  Charges: OT General Charges $OT Visit: 1 Visit OT Treatments $Therapeutic Activity: 8-22 mins $Neuromuscular Re-education: 8-22 mins  Luisa Dago, OT/L   Acute OT Clinical Specialist Acute Rehabilitation Services Pager [phone] Office [phone]   [facility] 08/05/2021, 10:47 AM

## 2021-08-06 DIAGNOSIS — R7989 Other specified abnormal findings of blood chemistry: Secondary | ICD-10-CM

## 2021-08-06 DIAGNOSIS — E871 Hypo-osmolality and hyponatremia: Secondary | ICD-10-CM

## 2021-08-06 LAB — COMPREHENSIVE METABOLIC PANEL
ALT: 86 U/L — ABNORMAL HIGH (ref 0–44)
AST: 62 U/L — ABNORMAL HIGH (ref 15–41)
Albumin: 3.7 g/dL (ref 3.5–5.0)
Alkaline Phosphatase: 84 U/L (ref 38–126)
Anion gap: 10 (ref 5–15)
BUN: 15 mg/dL (ref 6–20)
CO2: 24 mmol/L (ref 22–32)
Calcium: 9.5 mg/dL (ref 8.9–10.3)
Chloride: 99 mmol/L (ref 98–111)
Creatinine, Ser: 0.91 mg/dL (ref 0.61–1.24)
GFR, Estimated: >60 mL/min (ref >60)
Glucose, Bld: 97 mg/dL (ref 70–99)
Potassium: 3.7 mmol/L (ref 3.5–5.1)
Sodium: 133 mmol/L — ABNORMAL LOW (ref 135–145)
Total Bilirubin: 0.8 mg/dL (ref 0.3–1.2)
Total Protein: 7.7 g/dL (ref 6.5–8.1)

## 2021-08-06 LAB — CBC WITH DIFFERENTIAL/PLATELET
Abs Immature Granulocytes: 0.04 10*3/uL (ref 0.00–0.07)
Basophils Absolute: 0.1 10*3/uL (ref 0.0–0.1)
Basophils Relative: 0 %
Eosinophils Absolute: 0.3 10*3/uL (ref 0.0–0.5)
Eosinophils Relative: 2 %
HCT: 36.5 % — ABNORMAL LOW (ref 39.0–52.0)
Hemoglobin: 12.8 g/dL — ABNORMAL LOW (ref 13.0–17.0)
Immature Granulocytes: 0 %
Lymphocytes Relative: 24 %
Lymphs Abs: 2.7 10*3/uL (ref 0.7–4.0)
MCH: 30.9 pg (ref 26.0–34.0)
MCHC: 35.1 g/dL (ref 30.0–36.0)
MCV: 88.2 fL (ref 80.0–100.0)
Monocytes Absolute: 1.2 10*3/uL — ABNORMAL HIGH (ref 0.1–1.0)
Monocytes Relative: 10 %
Neutro Abs: 7.1 10*3/uL (ref 1.7–7.7)
Neutrophils Relative %: 64 %
Platelets: 496 10*3/uL — ABNORMAL HIGH (ref 150–400)
RBC: 4.14 MIL/uL — ABNORMAL LOW (ref 4.22–5.81)
RDW: 12.9 % (ref 11.5–15.5)
WBC: 11.3 10*3/uL — ABNORMAL HIGH (ref 4.0–10.5)
nRBC: 0 % (ref 0.0–0.2)

## 2021-08-06 LAB — GLUCOSE, CAPILLARY: Glucose-Capillary: 105 mg/dL — ABNORMAL HIGH (ref 70–99)

## 2021-08-06 NOTE — Evaluation (Addendum)
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 No pertinent past medical history. Past Surgical History:  Past Surgical History:  Procedure Laterality Date   IR ANGIO EXTRACRAN SEL COM CAROTID INNOMINATE UNI R MOD SED  07/27/2021   IR CT HEAD LTD  07/27/2021   IR INTRA CRAN STENT  07/27/2021   IR PERCUTANEOUS ART THROMBECTOMY/INFUSION INTRACRANIAL INC DIAG ANGIO  07/27/2021   RADIOLOGY WITH ANESTHESIA N/A 07/27/2021   Procedure: IR WITH ANESTHESIA;  Surgeon: Luanne Bras, MD;  Location: [facility];  Service: Radiology;  Laterality: N/A;    Assessment & Plan Clinical Impression: Patient is a 25 y.o. year old male  with unremarkable past medical history on no prescription medications.  He does have a history of marijuana and alcohol use.  Per chart review he was living at his sister's house working at [employer].  He does have a girlfriend.  Presented 07/26/2021 unresponsive after having intercourse with his girlfriend requiring intubation for airway protection as well as noted seizure.  His fiance/girlfriend reports that patient had been having complaints of headache for 3 to 4 days.  Cranial CT scan negative.  CT angiogram of head and neck showed intimal irregularity with filling defect involving the proximal left vertebral artery, V1 segment concerning for acute arterial dissection.  Subtle irregular nonocclusive filling defect involving the distal basilar artery/basilar tip consistent with associated distal  embolic disease.  Adjacent superior cerebellar arteries were not visualized.  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  Negative CT venogram no evidence for dural sinus thrombosis.  Cranial CT scan was again repeated showing no acute pathology.  He was initially loaded with Keppra.  EEG showed no seizure activity and Keppra has since been discontinued.  Admission chemistries potassium 2.7-3.0, glucose 112, WBC 19,000, lactic acid 4.4, alcohol level less than 10, ammonia level 58, urine drug screen positive for marijuana.  Patient did undergo distal embolization to top of the basilar status post IR with left LV stenting per interventional radiology.  Currently maintained on aspirin 81 mg daily as well as Brilinta 90 mg twice daily.  Subcutaneous Lovenox for DVT prophylaxis.  Initially with Cortrak feeding tube diet is since been advanced to regular.  Bouts of restlessness agitation monitor closely on Seroquel nightly.  Therapy evaluations completed due to patient decreased functional mobility was admitted for a comprehensive rehab program. Patient transferred to CIR on 08/05/2021 .    Patient currently requires total with basic self-care skills secondary to muscle weakness, decreased cardiorespiratoy endurance, impaired timing and sequencing, abnormal tone, unbalanced muscle activation, motor apraxia, ataxia, decreased coordination, and decreased motor planning, decreased visual perceptual skills and decreased visual motor skills, decreased midline orientation, decreased attention to left, decreased motor planning, and ideational apraxia, decreased initiation, decreased attention, decreased awareness, decreased problem solving, decreased safety awareness, decreased memory, and delayed processing, and decreased sitting balance, decreased standing balance, decreased postural control, hemiplegia, and decreased  balance strategies.  Prior to hospitalization, patient could complete BADL with independent .  Patient will benefit from skilled intervention to increase independence with basic self-care skills prior to discharge home with care partner.  Anticipate patient will require minimal physical assistance and follow up home health.  OT - End of Session Endurance Deficit: Yes Endurance Deficit Description: Rest breaks within BADL tasks OT Assessment Rehab Potential (ACUTE ONLY): Good OT Patient demonstrates impairments in the following area(s): Balance;Behavior;Cognition;Edema;Endurance;Motor;Nutrition;Pain;Perception;Safety;Sensory;Skin Integrity;Vision OT Basic ADL's Functional Problem(s): Eating;Grooming;Bathing;Dressing;Toileting OT Transfers Functional Problem(s): Toilet;Tub/Shower OT Additional Impairment(s): Fuctional Use of Upper Extremity OT Plan OT Intensity: Minimum of 1-2 x/day, 45 to 90 minutes OT Frequency: 5 out of 7 days OT Duration/Estimated Length of Stay: 25-28 days OT Treatment/Interventions: Balance/vestibular training;Cognitive remediation/compensation;Community reintegration;Discharge planning;Disease mangement/prevention;DME/adaptive equipment instruction;Functional electrical stimulation;Functional mobility training;Neuromuscular re-education;Pain management;Self Care/advanced ADL retraining;Skin care/wound managment;Splinting/orthotics;Patient/family education;Psychosocial support;Therapeutic Activities;Therapeutic Exercise;UE/LE Strength taining/ROM;UE/LE Coordination activities;Visual/perceptual remediation/compensation;Wheelchair propulsion/positioning OT Self Feeding Anticipated Outcome(s): Supervision/set-up OT Basic Self-Care Anticipated Outcome(s): Min A OT Toileting Anticipated Outcome(s): Min A OT Bathroom Transfers Anticipated Outcome(s): Min A OT Recommendation Recommendations for Other Services: Therapeutic Recreation consult Therapeutic Recreation Interventions:  Outing/community reintergration;Stress management Patient destination: Home Follow Up Recommendations: Home health OT Equipment Recommended: To be determined  OT Evaluation Precautions/Restrictions  Precautions Precautions: Fall Precaution Comments: R hemi, diplopia/visual deficits Restrictions Weight Bearing Restrictions: No Pain  Pt reports pain in L shoulder. Repositioned for comfort. Home Living/Prior Functioning Home Living Family/patient expects to be discharged to:: Private residence Available Help at Discharge: Family (sig other and sister but need to clarify) Type of Home: House Home Access: Level entry Bathroom Shower/Tub: Chiropodist: Standard  Lives With: Family (Pt reports he will go to his sisters house) IADL History Occupation: Full time employment Type of Occupation: Works at [employer] and [place]: Playing games and basketball Prior Function Vocation: Full time employment Vision Baseline Vision/History: 0 No visual deficits Ability to See in Adequate Light: 1 Impaired Patient Visual Report: Diplopia Vision Assessment?: Yes Tracking/Visual Pursuits: Impaired - to be further tested in functional context Additional Comments: L lens partially occluded with diminished diplopia Perception  Perception: Impaired Inattention/Neglect: Does not attend to left visual field;Does not attend to left side of body Praxis Praxis: Impaired Praxis Impairment Details: Initiation;Ideomotor;Motor planning;Perseveration Cognition Overall Cognitive Status: Impaired/Different from baseline Arousal/Alertness: Awake/alert Orientation Level: Situation;Person;Place Person: Oriented Place: Oriented Situation: Oriented Year: 2023 Month: November Day of Week: Incorrect Memory: Impaired Immediate Memory Recall: Sock;Bed;Blue Memory Recall Sock: Without Cue Memory Recall Blue: Without Cue Memory Recall Bed: Without Cue Attention:  Sustained Sustained Attention Impairment: Verbal basic Sensation Sensation Light Touch: Impaired Detail Light Touch Impaired Details: Impaired LLE;Impaired LUE Proprioception: Impaired Detail Proprioception Impaired Details: Impaired LUE;Impaired LLE Stereognosis: Impaired Detail Stereognosis Impaired Details: Impaired LLE;Impaired LUE Coordination Gross Motor Movements are Fluid and Coordinated: No Fine Motor Movements are Fluid and Coordinated: No Coordination and Movement Description: decreased smoothness and acuracy with B UE's. L UE more apraxic than R, but R UE weakness Finger Nose Finger Test: L UE overshooting and difficulty getting R and L finger to nose, more accurate on R side Motor  Motor Motor: Hemiplegia;Ataxia;Motor apraxia;Abnormal postural alignment and control  Balance Dynamic Sitting Balance Sitting balance - Comments: Moderate assistance for sitting balance progressing to min Extremity/Trunk Assessment RUE Assessment RUE Assessment: Exceptions to [facility] General Strength Comments: R UE 2/5 overall, ableto lift overhead with guided A from OT LUE Assessment LUE Assessment: Exceptions to [facility] General Strength Comments: 2+/5 very ataxic on L side with poor awareness and proprioception on L  Care Tool  Care Tool Self Care Eating   Eating Assist Level: Total Assistance - Patient < 25%    Oral Care    Oral Care Assist Level: Total assistance - Patient < 25%    Bathing     Body parts bathed by helper: Right arm;Left arm;Abdomen;Chest;Buttocks;Front perineal area;Right upper leg;Left upper leg;Face;Left lower leg;Right lower leg   Assist Level: Total Assistance - Patient < 25%    Upper Body Dressing(including orthotics)   What is the patient wearing?: Pull over shirt   Assist Level: Total Assistance - Patient < 25%    Lower Body Dressing (excluding footwear)   What is the patient wearing?: Pants Assist for lower body dressing: Total Assistance - Patient < 25%     Putting on/Taking off footwear Putting on/taking off footwear activity did not occur: Safety/medical concerns What is the patient wearing?: Non-skid slipper socks Assist for footwear: Dependent - Patient 0%       Care Tool Toileting Toileting activity   Assist for toileting: Total Assistance - Patient < 25%     Care Tool Bed Mobility Roll left and right activity   Roll left and right assist level: Total Assistance - Patient < 25%    Sit to lying activity   Sit to lying assist level: Maximal Assistance - Patient 25 - 49%    Lying to sitting on side of bed activity   Lying to sitting on side of bed assist level: the ability to move from lying on the back to sitting on the side of the bed with no back support.: Maximal Assistance - Patient 25 - 49%     Care Tool Transfers Sit to stand transfer Sit to stand activity did not occur: Safety/medical concerns Sit to stand assist level: Total Assistance - Patient < 25%    Chair/bed transfer Chair/bed transfer activity did not occur: Safety/medical concerns Chair/bed transfer assist level: Maximal Assistance - Patient 25 - 49% (Stedy)     Materials engineer transfer activity did not occur: Safety/medical concerns       Care Tool Cognition  Expression of Ideas and Wants Expression of Ideas and Wants: 2. Frequent difficulty - frequently exhibits difficulty with expressing needs and ideas  Understanding Verbal and Non-Verbal Content Understanding Verbal and Non-Verbal Content: 3. Usually understands - understands most conversations, but misses some part/intent of message. Requires cues at times to understand   Memory/Recall Ability Memory/Recall Ability : Current season;That he or she is in a hospital/hospital unit   Refer to Care Plan for [place] 1 OT Short Term Goal 1 (Week 1): Patient will perform sit<>stand with max A of 1 and LRAD in preparation for BADL tasks OT Short Term Goal 2 (Week 1): Pt  will bring eating etensil to mouth with min A OT Short Term Goal 3 (Week 1): Patient will maintain grasp on toothbrush using adaptive equipment and brush for 30 seconds OT Short Term Goal 4 (Week 1): Patient will complete 1 step of UB dressing task.  Recommendations for other services: Therapeutic Recreation  Stress management and Outing/community reintegration   Skilled Therapeutic Intervention Pt greeted semi-reclined in bed and agreeable to OT eval and treat. OT eval completed addressing rehab process, OT purpose, POC, ELOS, and goals. Patient needed max A to transfer to EOB. Max A transfers with stedy, progressing to mod A with practice. Pt very apraxic with global weakness on B UE's. He demonstrates more ataxia and weakness on R UE, but  L UE is very apraxic with "alien arm" tendencies and difficulty using functionally as well. Pt with fluctuating mood, dysarthric, and difficult to understand. Pt often with verbal outbursts and repeating the same word or phrase similar to a stutter. Pt with poor body awareness and trunk control requiring up to mod A for sitting balance and difficulty leaning forward to the sink.  Pt unable to maintain grasp on wash cloth to adequately assist with bathing tasks using either UE. Pt able to get feeding utensil or toothbrush up to mouth briefly with R hand, but unable to maintain position or motor plan movement to brush teeth. Pt with difficulty maintaining oral secretions as well throughout session. Pt already with taped glasses for double vision which he is still experiencing. Overall pt needed max/total A for all BADL tasks and mod/max A for functional transfers. See below for further details regarding BADL performance.   ADL ADL Eating: Dependent Grooming: Dependent Upper Body Bathing: Dependent Lower Body Bathing: Dependent Upper Body Dressing: Dependent Lower Body Dressing: Dependent Toileting: Maximal assistance Toilet Transfer: Maximal assistance Mobility   Bed Mobility Bed Mobility: Sit to Supine;Supine to Sit Supine to Sit: Maximal Assistance - Patient - Patient 25-49% Sit to Supine: Maximal Assistance - Patient 25-49%   Discharge Criteria: Patient will be discharged from OT if patient refuses treatment 3 consecutive times without medical reason, if treatment goals not met, if there is a change in medical status, if patient makes no progress towards goals or if patient is discharged from hospital.  The above assessment, treatment plan, treatment alternatives and goals were discussed and mutually agreed upon: by patient  Valma Cava 08/06/2021, 2:23 PM

## 2021-08-06 NOTE — Progress Notes (Signed)
Inpatient Rehabilitation  Patient information reviewed and entered into eRehab system by Kimiko Common M. Charnetta Wulff, M.A., CCC/SLP, PPS Coordinator.  Information including medical coding, functional ability and quality indicators will be reviewed and updated through discharge.    

## 2021-08-06 NOTE — IPOC Note (Signed)
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[facility])  Hospital Problems: Principal Problem:   Posterior circulation stroke [facility])     Functional Problem List: Nursing Behavior, Bladder, Bowel, Endurance, Medication Management, Pain, Safety, Skin Integrity  PT Balance, Endurance, Motor, Pain, Perception, Safety, Sensory  OT Balance, Behavior, Cognition, Edema, Endurance, Motor, Nutrition, Pain, Perception, Safety, Sensory, Skin Integrity, Vision  SLP Cognition, Linguistic  TR         Basic ADL's: OT Eating, Grooming, Bathing, Dressing, Toileting     Advanced  ADL's: OT       Transfers: PT Bed Mobility, Bed to Chair, Car, Occupational psychologist, Research scientist (life sciences): PT Ambulation, Psychologist, prison and probation services, Stairs     Additional Impairments: OT Fuctional Use of Upper Extremity  SLP Communication, Social Cognition expression Attention, Memory, Problem Solving, Awareness  TR      Anticipated Outcomes Item Anticipated Outcome  Self Feeding Supervision/set-up  Swallowing      Basic self-care  Min A  Toileting  Min A   Bathroom Transfers Min A  Bowel/Bladder  supervision  Transfers  MinA  Locomotion  MinA Household distances  Communication  Min A  Cognition  Min A  Pain  <3  Safety/Judgment  supervision and no falls   Therapy Plan: PT Intensity: Minimum of 1-2 x/day ,45 to 90 minutes PT Frequency: 5 out of 7 days PT Duration Estimated Length of Stay: 3.5-4 Weeks OT Intensity: Minimum of 1-2 x/day, 45 to 90 minutes OT Frequency: 5 out of 7 days OT Duration/Estimated Length of Stay: 25-28 days SLP Intensity: Minumum of 1-2 x/day, 30 to 90 minutes SLP Frequency: 3 to 5 out of 7 days SLP Duration/Estimated Length of Stay: 4 weeks   Due to the current state of emergency, patients may not be receiving their 3-hours of Medicare-mandated therapy.   Team  Interventions: Nursing Interventions Patient/Family Education, Bladder Management, Bowel Management, Disease Management/Prevention, Pain Management, Medication Management, Skin Care/Wound Management, Discharge Planning  PT interventions Ambulation/gait training, Community reintegration, DME/adaptive equipment instruction, Neuromuscular re-education, Psychosocial support, UE/LE Strength taining/ROM, Wheelchair propulsion/positioning, Stair training, Warden/ranger, Functional electrical stimulation, Pain management, Skin care/wound management, UE/LE Coordination activities, Therapeutic Activities, Cognitive remediation/compensation, Disease management/prevention, Patient/family education, Therapeutic Exercise, Visual/perceptual remediation/compensation, Splinting/orthotics, Functional mobility training, Discharge planning  OT Interventions Balance/vestibular training, Cognitive remediation/compensation, Community reintegration, Discharge planning, Disease mangement/prevention, DME/adaptive equipment instruction, Functional electrical stimulation, Functional mobility training, Neuromuscular re-education, Pain management, Self Care/advanced ADL retraining, Skin care/wound managment, Splinting/orthotics, Patient/family education, Psychosocial support, Therapeutic Activities, Therapeutic Exercise, UE/LE Strength taining/ROM, UE/LE Coordination activities, Visual/perceptual remediation/compensation, Wheelchair propulsion/positioning  SLP Interventions Cognitive remediation/compensation, Internal/external aids, Speech/Language facilitation, Cueing hierarchy, Environmental controls, Therapeutic Activities, Functional tasks, Patient/family education  TR Interventions    SW/CM Interventions     Barriers to Discharge MD  Medical stability  Nursing Decreased caregiver support, Home environment access/layout, Incontinence, Wound Care, Weight, Medication compliance, New oxygen Lives in 1 level home with  level entry. Sister is not currently working. Has 2 Aunts and other family to assist after discharge.  PT      OT      SLP      SW       Team Discharge Planning: Destination: PT-Home ,OT- Home , SLP-Home Projected Follow-up: PT-Home health PT, 24 hour supervision/assistance, OT-  Home health OT, SLP-24 hour supervision/assistance, Home Health SLP, Outpatient SLP Projected Equipment Needs: PT-To be determined, OT- To be  determined, SLP-None recommended by SLP Equipment Details: PT- , OT-  Patient/family involved in discharge planning: PT- Patient,  OT-Patient, SLP-Patient  MD ELOS: 3-4 weeks Medical Rehab Prognosis:  Excellent Assessment: The patient has been admitted for CIR therapies with the diagnosis of cerebellar infarct. The team will be addressing functional mobility, strength, stamina, balance, safety, adaptive techniques and equipment, self-care, bowel and bladder mgt, patient and caregiver education, NMR, speech and cognition, coordination, and community reentry. Goals have been set at min assist for mobility, self-care and speech/communication.   Due to the current state of emergency, patients may not be receiving their 3 hours per day of Medicare-mandated therapy.    Ranelle Oyster, MD, FAAPMR     See Team Conference Notes for weekly updates to the plan of care

## 2021-08-06 NOTE — Plan of Care (Signed)
Problem: RH Balance Goal: LTG: Patient will maintain dynamic sitting balance (OT) Description: LTG:  Patient will maintain dynamic sitting balance with assistance during activities of daily living (OT) Flowsheets (Taken 08/06/2021 1210) LTG: Pt will maintain dynamic sitting balance during ADLs with: Supervision/Verbal cueing Goal: LTG Patient will maintain dynamic standing with ADLs (OT) Description: LTG:  Patient will maintain dynamic standing balance with assist during activities of daily living (OT)  Flowsheets (Taken 08/06/2021 1210) LTG: Pt will maintain dynamic standing balance during ADLs with: Contact Guard/Touching assist   Problem: Sit to Stand Goal: LTG:  Patient will perform sit to stand in prep for activites of daily living with assistance level (OT) Description: LTG:  Patient will perform sit to stand in prep for activites of daily living with assistance level (OT) Flowsheets (Taken 08/06/2021 1210) LTG: PT will perform sit to stand in prep for activites of daily living with assistance level: Contact Guard/Touching assist   Problem: RH Eating Goal: LTG Patient will perform eating w/assist, cues/equip (OT) Description: LTG: Patient will perform eating with assist, with/without cues using equipment (OT) Flowsheets (Taken 08/06/2021 1210) LTG: Pt will perform eating with assistance level of: Supervision/Verbal cueing   Problem: RH Grooming Goal: LTG Patient will perform grooming w/assist,cues/equip (OT) Description: LTG: Patient will perform grooming with assist, with/without cues using equipment (OT) Flowsheets (Taken 08/06/2021 1210) LTG: Pt will perform grooming with assistance level of: Supervision/Verbal cueing   Problem: RH Bathing Goal: LTG Patient will bathe all body parts with assist levels (OT) Description: LTG: Patient will bathe all body parts with assist levels (OT) Flowsheets (Taken 08/06/2021 1210) LTG: Pt will perform bathing with assistance level/cueing:  Minimal Assistance - Patient > 75%   Problem: RH Dressing Goal: LTG Patient will perform upper body dressing (OT) Description: LTG Patient will perform upper body dressing with assist, with/without cues (OT). Flowsheets (Taken 08/06/2021 1210) LTG: Pt will perform upper body dressing with assistance level of: Contact Guard/Touching assist Goal: LTG Patient will perform lower body dressing w/assist (OT) Description: LTG: Patient will perform lower body dressing with assist, with/without cues in positioning using equipment (OT) Flowsheets (Taken 08/06/2021 1210) LTG: Pt will perform lower body dressing with assistance level of: Minimal Assistance - Patient > 75%   Problem: RH Toileting Goal: LTG Patient will perform toileting task (3/3 steps) with assistance level (OT) Description: LTG: Patient will perform toileting task (3/3 steps) with assistance level (OT)  Flowsheets (Taken 08/06/2021 1210) LTG: Pt will perform toileting task (3/3 steps) with assistance level: Minimal Assistance - Patient > 75%   Problem: RH Vision Goal: RH LTG Vision Consulting civil engineer) Flowsheets (Taken 08/06/2021 1210) LTG: Vision Goals: Patient will use modifications to decrease diploplia and visuall scan to the L with minimal cues   Problem: RH Functional Use of Upper Extremity Goal: LTG Patient will use RT/LT upper extremity as a (OT) Description: LTG: Patient will use right/left upper extremity as a stabilizer/gross assist/diminished/nondominant/dominant level with assist, with/without cues during functional activity (OT) Flowsheets (Taken 08/06/2021 1210) LTG: Use of upper extremity in functional activities:  RUE as diminished level  LUE as diminished level LTG: Pt will use upper extremity in functional activity with assistance level of: Minimal Assistance - Patient > 75%   Problem: RH Toilet Transfers Goal: LTG Patient will perform toilet transfers w/assist (OT) Description: LTG: Patient will perform toilet  transfers with assist, with/without cues using equipment (OT) Flowsheets (Taken 08/06/2021 1210) LTG: Pt will perform toilet transfers with assistance level of: Minimal Assistance - Patient >  75%   Problem: RH Tub/Shower Transfers Goal: LTG Patient will perform tub/shower transfers w/assist (OT) Description: LTG: Patient will perform tub/shower transfers with assist, with/without cues using equipment (OT) Flowsheets (Taken 08/06/2021 1210) LTG: Pt will perform tub/shower stall transfers with assistance level of: Minimal Assistance - Patient > 75%   Problem: RH Attention Goal: LTG Patient will demonstrate this level of attention during functional activites (OT) Description: LTG:  Patient will demonstrate this level of attention during functional activites  (OT) Flowsheets (Taken 08/06/2021 1210) LTG: Patient will demonstrate this level of attention during functional activites (OT): Minimal Assistance - Patient > 75%

## 2021-08-06 NOTE — Progress Notes (Signed)
Patient ID: Roy [name], male   DOB: [birth year], 25 y.o.   MRN: [id number]  SW made efforts to complete assessment with pt but unable to complete due to aphasia. SW will complete a collateral assessment.   SW spoke with [name] [phone]) to introduce self, explain role, and discuss discharge process. She confirms pt lives with his sister [name] and does not work. She will call SW back since she was not in  a place where she could speak freely.   Cecile Sheerer, MSW, LCSWA Office: [phone] Cell: [phone] Fax: [phone]

## 2021-08-06 NOTE — Progress Notes (Signed)
PROGRESS NOTE   Subjective/Complaints: Pt says he was restless last night. Might have been because he missed his girlfriend. No focal pain.  Review of Systems  HENT:  Negative for hearing loss and tinnitus.   Cardiovascular:  Negative for chest pain and claudication.  Gastrointestinal:  Negative for nausea and vomiting.  Musculoskeletal:  Negative for neck pain.  Neurological:  Positive for sensory change and focal weakness. Negative for dizziness.  Psychiatric/Behavioral:  Negative for depression. The patient has insomnia.   di   Objective:   No results found. Recent Labs    08/05/21 0215 08/06/21 0504  WBC 12.0* 11.3*  HGB 12.6* 12.8*  HCT 38.0* 36.5*  PLT 502* 496*   Recent Labs    08/05/21 0215 08/06/21 0504  NA 132* 133*  K 4.2 3.7  CL 98 99  CO2 22 24  GLUCOSE 98 97  BUN 17 15  CREATININE 0.88 0.91  CALCIUM 9.5 9.5    Intake/Output Summary (Last 24 hours) at 08/06/2021 1233 Last data filed at 08/06/2021 0735 Gross per 24 hour  Intake 700 ml  Output --  Net 700 ml        Physical Exam: Vital Signs Blood pressure (!) 133/93, pulse 80, temperature 98.9 F (37.2 C), temperature source Oral, resp. rate 18, height 5\' 2"  (1.575 m), weight 98 kg, SpO2 98 %.  General: Alert and oriented x 3, No apparent distress. obese HEENT: Head is normocephalic, atraumatic, PERRLA, EOMI, sclera anicteric, oral mucosa pink and moist, dentition intact, ext ear canals clear,  Neck: Supple without JVD or lymphadenopathy Heart: Reg rate and rhythm. No murmurs rubs or gallops Chest: CTA bilaterally without wheezes, rales, or rhonchi; no distress Abdomen: Soft, non-tender, non-distended, bowel sounds positive. Extremities: No clubbing, cyanosis, or edema. Pulses are 2+ Psych: [name] affect is appropriate. Pt is cooperative Skin: Clean and intact without signs of breakdown Neuro:  pt is alert. EXTREMELY dysarthric. Appears  to be oriented. Follows basic commands. Insight and awareness difficult to assess d/t dysarthria. Right facial weakness. Gaze dysconjugate, pt struggles to track with left eye beyond midline. Left lens taped. RUE 5, RLE 4-5/5 LUE and LLE 3+ to 4/5 with 1/2 sensation present. DTR's 1+. Musculoskeletal: normal ROM, no focal pain.    Assessment/Plan: 1. Functional deficits which require 3+ hours per day of interdisciplinary therapy in a comprehensive inpatient rehab setting. Physiatrist is providing close team supervision and 24 hour management of active medical problems listed below. Physiatrist and rehab team continue to assess barriers to discharge/monitor patient progress toward functional and medical goals  Care Tool:  Bathing        Body parts bathed by helper: Right arm, Left arm, Abdomen, Chest, Buttocks, Front perineal area, Right upper leg, Left upper leg, Face, Left lower leg, Right lower leg     Bathing assist Assist Level: Total Assistance - Patient < 25%     Upper Body Dressing/Undressing Upper body dressing   What is the patient wearing?: Pull over shirt    Upper body assist Assist Level: Maximal Assistance - Patient 25 - 49%    Lower Body Dressing/Undressing Lower body dressing      What  is the patient wearing?: Pants     Lower body assist Assist for lower body dressing: Total Assistance - Patient < 25%     Toileting Toileting    Toileting assist Assist for toileting: Total Assistance - Patient < 25%     Transfers Chair/bed transfer  Transfers assist  Chair/bed transfer activity did not occur: Safety/medical concerns  Chair/bed transfer assist level: Maximal Assistance - Patient 25 - 49% (Stedy)     Locomotion Ambulation   Ambulation assist   Ambulation activity did not occur: Safety/medical concerns          Walk 10 feet activity   Assist           Walk 50 feet activity   Assist           Walk 150 feet  activity   Assist           Walk 10 feet on uneven surface  activity   Assist           Wheelchair     Assist Is the patient using a wheelchair?: No             Wheelchair 50 feet with 2 turns activity    Assist            Wheelchair 150 feet activity     Assist          Blood pressure (!) 133/93, pulse 80, temperature 98.9 F (37.2 C), temperature source Oral, resp. rate 18, height 5\' 2"  (1.575 m), weight 98 kg, SpO2 98 %.  Medical Problem List and Plan: 1. 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  Antithrombotics: -DVT/anticoagulation:  Pharmaceutical: Lovenox             -antiplatelet therapy: Aspirin 81 mg daily and Brilinta 90 mg twice daily 3. Pain Management: Tylenol as needed 4. Mood: Zoloft 100 mg daily, melatonin 3 mg nightly             -antipsychotic agents: Seroquel 50 mg nightly for agitation 5. Neuropsych: This patient is capable of making decisions on his own behalf. 6. Skin/Wound Care: Routine skin checks 7. Fluids/Electrolytes/Nutrition: encourage PO  -hyponatremia 133, sl improved from 132   -continue current diet, meds, rechck Monday 8.  Hyperlipidemia.  Lipitor 9.  History of alcohol and marijuana use.  Urine drug screen positive marijuana.  Counseling 10. Morbid Obesity- BMI 39.52- counseling and diet education 11. Elevated LFT's  -sl increase from 11/8  -pt is on lipitor---recheck cmet Monday  12. Mild leukocytosis: trending down  -no s/s infection  -recheck Monday    LOS: 1 days A FACE TO FACE EVALUATION WAS PERFORMED  Wednesday 08/06/2021, 12:33 PM

## 2021-08-06 NOTE — Discharge Instructions (Addendum)
Inpatient Rehab Discharge Instructions  Roy [name] Discharge date and time: No discharge date for patient encounter.   Activities/Precautions/ Functional Status: Activity: activity as tolerated Diet: regular diet Wound Care: Routine skin checks Functional status:  ___ No restrictions     ___ Walk up steps independently ___ 24/7 supervision/assistance   ___ Walk up steps with assistance ___ Intermittent supervision/assistance  ___ Bathe/dress independently ___ Walk with walker     __x_ Bathe/dress with assistance ___ Walk Independently    ___ Shower independently ___ Walk with assistance    ___ Shower with assistance ___ No alcohol     ___ Return to work/school ________   COMMUNITY REFERRALS UPON DISCHARGE:     Medical Equipment/Items Ordered:wheelchair Scientist, physiological)                                                 Agency/Supplier:[facility] [employer]    GENERAL COMMUNITY RESOURCES FOR PATIENT/FAMILY: *You are set up for transportation services with [employer] [phone]. This services is free with any [facility] provider. Please call to schedule appointments 2-3 days in advance.    Special Instructions: No driving smoking alcohol or illicit drug useSTROKE/TIA DISCHARGE INSTRUCTIONS SMOKING Cigarette smoking nearly doubles your risk of having a stroke & is the single most alterable risk factor  If you smoke or have smoked in the last 12 months, you are advised to quit smoking for your health. Most of the excess cardiovascular risk related to smoking disappears within a year of stopping. Ask you doctor about anti-smoking medications  Quit Line: 1-800-QUIT NOW Free Smoking Cessation Classes [phone]  CHOLESTEROL Know your levels; limit fat & cholesterol in your diet  Lipid Panel     Component Value Date/Time   CHOL 199 07/28/2021 0519   TRIG 230 (H) 07/28/2021 0519   HDL 37 (L) 07/28/2021 0519   CHOLHDL 5.4 07/28/2021 0519   VLDL 46 (H) 07/28/2021 0519    LDLCALC 116 (H) 07/28/2021 0519     Many patients benefit from treatment even if their cholesterol is at goal. Goal: Total Cholesterol (CHOL) less than 160 Goal:  Triglycerides (TRIG) less than 150 Goal:  HDL greater than 40 Goal:  LDL (LDLCALC) less than 100   BLOOD PRESSURE American Stroke Association blood pressure target is less that 120/80 mm/Hg  Your discharge blood pressure is:  BP: (!) 133/93 Monitor your blood pressure Limit your salt and alcohol intake Many individuals will require more than one medication for high blood pressure  DIABETES (A1c is a blood sugar average for last 3 months) Goal HGBA1c is under 7% (HBGA1c is blood sugar average for last 3 months)  Diabetes: No known diagnosis of diabetes    Lab Results  Component Value Date   HGBA1C 5.7 (H) 07/27/2021    Your HGBA1c can be lowered with medications, healthy diet, and exercise. Check your blood sugar as directed by your physician Call your physician if you experience unexplained or low blood sugars.  PHYSICAL ACTIVITY/REHABILITATION Goal is 30 minutes at least 4 days per week  Activity: Increase activity slowly, Therapies: Physical Therapy: Home Health Return to work:  Activity decreases your risk of heart attack and stroke and makes your heart stronger.  It helps control your weight and blood pressure; helps you relax and can improve your mood. Participate in a regular exercise program. Talk with  your doctor about the best form of exercise for you (dancing, walking, swimming, cycling).  DIET/WEIGHT Goal is to maintain a healthy weight  Your discharge diet is:  Diet Order             Diet regular Room service appropriate? Yes; Fluid consistency: Thin  Diet effective now                   liquids Your height is:  Height: 5\' 2"  (157.5 cm) Your current weight is: Weight: 98 kg Your Body Mass Index (BMI) is:  BMI (Calculated): 39.51 Following the type of diet specifically designed for you will help  prevent another stroke. Your goal weight range is:   Your goal Body Mass Index (BMI) is 19-24. Healthy food habits can help reduce 3 risk factors for stroke:  High cholesterol, hypertension, and excess weight.  RESOURCES Stroke/Support Group:  Call [phone]   STROKE EDUCATION PROVIDED/REVIEWED AND GIVEN TO PATIENT Stroke warning signs and symptoms How to activate emergency medical system (call 911). Medications prescribed at discharge. Need for follow-up after discharge. Personal risk factors for stroke. Pneumonia vaccine given: No Flu vaccine given: No My questions have been answered, the writing is legible, and I understand these instructions.  I will adhere to these goals & educational materials that have been provided to me after my discharge from the hospital.       My questions have been answered and I understand these instructions. I will adhere to these goals and the provided educational materials after my discharge from the hospital.  Patient/Caregiver Signature _______________________________ Date __________  Clinician Signature _______________________________________ Date __________  Please bring this form and your medication list with you to all your follow-up doctor's appointments.

## 2021-08-06 NOTE — Evaluation (Signed)
Speech Language Pathology Assessment and Plan  Patient Details  Name: Roy [name] MRN: [id number] Date of Birth: 11/13/1995  SLP Diagnosis: Dysarthria;Cognitive Impairments  Rehab Potential: Good ELOS: 4 weeks    Today's Date: 08/06/2021 SLP Individual Time: 1035-1130 SLP Individual Time Calculation (min): 55 min   Hospital Problem: Principal Problem:   Posterior circulation stroke (HCC)  Past Medical History: History reviewed. No pertinent past medical history. Past Surgical History:  Past Surgical History:  Procedure Laterality Date   IR ANGIO EXTRACRAN SEL COM CAROTID INNOMINATE UNI R MOD SED  07/27/2021   IR CT HEAD LTD  07/27/2021   IR INTRA CRAN STENT  07/27/2021   IR PERCUTANEOUS ART THROMBECTOMY/INFUSION INTRACRANIAL INC DIAG ANGIO  07/27/2021   RADIOLOGY WITH ANESTHESIA N/A 07/27/2021   Procedure: IR WITH ANESTHESIA;  Surgeon: Deveshwar, Sanjeev, MD;  Location: [facility] OR;  Service: Radiology;  Laterality: N/A;    Assessment / Plan / Recommendation Clinical Impression Patient is a 25-year-old right-handed male with unremarkable past medical history on no prescription medications.  Presented 07/26/2021 unresponsive requiring intubation for airway protection as well as noted seizure.  Cranial CT scan negative.  CT angiogram of head and neck showed intimal irregularity with filling defect involving the proximal left vertebral artery, V1 segment concerning for acute arterial dissection.  Subtle irregular nonocclusive filling defect involving the distal basilar artery/basilar tip consistent with associated distal embolic disease.  Adjacent superior cerebellar arteries were not visualized.  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  Patient did undergo distal embolization to top of the basilar status post IR with left LV stenting per interventional radiology.  Bouts of restlessness and  agitation.  Therapy evaluations completed due to patient decreased functional mobility. Patient admitted for a comprehensive rehab program 08/05/21.  Patient demonstrates mild cognitive impairments impacting sustained attention, emergent awareness, functional problem solving and short-term recall based on an abbreviated version of the [place] [employer] Mental Status Examination (SLUMS). Patient's receptive and expressive abilities appeared WFL for tasks assessed but patient's functional communication is impacted by severe speech deficits. Patient demonstrates reduced bilateral lingual ROM and strength and reduced facial ROM resulting in imprecise consonants. Patient's speech intelligibility is also impacted by inconsistent prosody and a strained vocal quality with forced expiration resulting in an increased rate of breathing after speech production. Patient also with verbal perseveration at the word level with decreased frustration tolerance during communication breakdowns. Patient responsive to cues to slow down and "relaxed" breathing.  Overall, patient is ~50% intelligible with decreased ability to utilize multimodal communication at this time due to motor deficits. Patient would benefit from skilled SLP intervention to maximize his functional communication and cognitive functioning in order to reduce caregiver burden at discharge.    Skilled Therapeutic Interventions          Administered a cognitive-linguistic evaluation, please see above for details. SLP facilitated session by providing Max-total A multimodal cues for use of a slow rate, diaphragmatic breathing and awareness of verbal perseveration at the word and phrase level resulting in 50% intelligibility.    SLP Assessment  Patient will need skilled Speech Lanaguage Pathology Services during CIR admission    Recommendations  Oral Care Recommendations: Oral care BID Recommendations for Other Services: Neuropsych consult Patient  destination: Home Follow up Recommendations: 24 hour supervision/assistance;Home Health SLP;Outpatient SLP Equipment Recommended: None recommended by SLP    SLP Frequency 3 to 5 out of 7   days   SLP Duration  SLP Intensity  SLP Treatment/Interventions 4 weeks  Minumum of 1-2 x/day, 30 to 90 minutes  Cognitive remediation/compensation;Internal/external aids;Speech/Language facilitation;Cueing hierarchy;Environmental controls;Therapeutic Activities;Functional tasks;Patient/family education    Pain No/Denies Pain   Prior Functioning Type of Home: House  Lives With: Family (Pt reports he will go to his sisters house) Available Help at Discharge: Family (sig other and sister but need to clarify) Vocation: Full time employment  SLP Evaluation Cognition Overall Cognitive Status: Impaired/Different from baseline Arousal/Alertness: Awake/alert Orientation Level: Oriented to person;Oriented to situation;Oriented to place Year: 2023 Month: November Day of Week: Incorrect Attention: Sustained Sustained Attention: Impaired Sustained Attention Impairment: Verbal basic Memory: Impaired Memory Impairment: Decreased short term memory Decreased Short Term Memory: Verbal basic Immediate Memory Recall: Sock;Bed;Blue Memory Recall Sock: Without Cue Memory Recall Blue: Without Cue Memory Recall Bed: Without Cue Awareness: Impaired Awareness Impairment: Emergent impairment Problem Solving: Impaired Problem Solving Impairment: Verbal basic Safety/Judgment: Impaired  Comprehension Auditory Comprehension Overall Auditory Comprehension: Appears within functional limits for tasks assessed Expression Expression Primary Mode of Expression: Verbal Verbal Expression Overall Verbal Expression: Appears within functional limits for tasks assessed Oral Motor Oral Motor/Sensory Function Overall Oral Motor/Sensory Function: Moderate impairment Facial ROM: Reduced right;Reduced left Facial  Symmetry: Abnormal symmetry right Facial Strength: Reduced right;Reduced left Lingual ROM: Reduced right;Reduced left Lingual Symmetry: Within Functional Limits Lingual Strength: Reduced Velum: Impaired right;Impaired left;Suspected CN X (Vagus) dysfunction Mandible: Impaired Motor Speech Overall Motor Speech: Impaired Respiration: Impaired Level of Impairment: Phrase Phonation: Low vocal intensity (strained) Articulation: Impaired Level of Impairment: Word Intelligibility: Intelligibility reduced Word: 0-24% accurate Phrase: 25-49% accurate Sentence: Not tested Conversation: Not tested Motor Planning: Witnin functional limits Effective Techniques: Slow rate;Increased vocal intensity;Over-articulate  Care Tool Care Tool Cognition Ability to hear (with hearing aid or hearing appliances if normally used Ability to hear (with hearing aid or hearing appliances if normally used): 0. Adequate - no difficulty in normal conservation, social interaction, listening to TV   Expression of Ideas and Wants Expression of Ideas and Wants: 2. Frequent difficulty - frequently exhibits difficulty with expressing needs and ideas   Understanding Verbal and Non-Verbal Content Understanding Verbal and Non-Verbal Content: 3. Usually understands - understands most conversations, but misses some part/intent of message. Requires cues at times to understand  Memory/Recall Ability Memory/Recall Ability : Current season;That he or she is in a hospital/hospital unit    Short Term Goals: Week 1: SLP Short Term Goal 1 (Week 1): Patient will demonstrate sustained attention to functional tasks for ~10 minutes with Mod verbal cues for redirection. SLP Short Term Goal 2 (Week 1): Patient will demonstrate emergent awareness of verbal perseveration at the word level with Max A multimodal cues. SLP Short Term Goal 3 (Week 1): Patient will utilize diaphragmatic breathing at the phrase level with Mod A multimodal  cues. SLP Short Term Goal 4 (Week 1): Patient will utilize speech intelligibility strategies at the phrase level with Max A multimodal cues to achieve ~75% intelligibility.  Refer to Care Plan for Long Term Goals  Recommendations for other services: None   Discharge Criteria: Patient will be discharged from SLP if patient refuses treatment 3 consecutive times without medical reason, if treatment goals not met, if there is a change in medical status, if patient makes no progress towards goals or if patient is discharged from hospital.  The above assessment, treatment plan, treatment alternatives and goals were discussed and mutually agreed upon: by patient  Latise Dilley 08/06/2021, 12:56 PM

## 2021-08-06 NOTE — Evaluation (Signed)
Physical Therapy Assessment and Plan  Patient Details  Name: Roy [name] MRN: [id number] Date of Birth: 08-03-1996  PT Diagnosis: Ataxia, Ataxic gait, Difficulty walking, Hemiplegia non-dominant, Impaired sensation, and Muscle weakness Rehab Potential: Good ELOS: 3.5-4 Weeks   Today's Date: 08/06/2021 PT Individual Time: 1405-1500 PT Individual Time Calculation (min): 55 min    Hospital Problem: Principal Problem:   Posterior circulation stroke [facility])   Past Medical History: History reviewed. No pertinent past medical history. Past Surgical History:  Past Surgical History:  Procedure Laterality Date   IR ANGIO EXTRACRAN SEL COM CAROTID INNOMINATE UNI R MOD SED  07/27/2021   IR CT HEAD LTD  07/27/2021   IR INTRA CRAN STENT  07/27/2021   IR PERCUTANEOUS ART THROMBECTOMY/INFUSION INTRACRANIAL INC DIAG ANGIO  07/27/2021   RADIOLOGY WITH ANESTHESIA N/A 07/27/2021   Procedure: IR WITH ANESTHESIA;  Surgeon: Luanne Bras, MD;  Location: [place];  Service: Radiology;  Laterality: N/A;    Assessment & Plan Clinical Impression: Patient is a 25 year old right-handed male with unremarkable past medical history on no prescription medications.  He does have a history of marijuana and alcohol use.  Per chart review he was living at his sister's house working at [employer].  He does have a girlfriend.  Presented 07/26/2021 unresponsive after having intercourse with his girlfriend requiring intubation for airway protection as well as noted seizure.  His fiance/girlfriend reports that patient had been having complaints of headache for 3 to 4 days.  Cranial CT scan negative.  CT angiogram of head and neck showed intimal irregularity with filling defect involving the proximal left vertebral artery, V1 segment concerning for acute arterial dissection.  Subtle irregular nonocclusive filling defect involving the distal basilar artery/basilar tip consistent with associated distal embolic disease.  Adjacent  superior cerebellar arteries were not visualized.  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  Negative CT venogram no evidence for dural sinus thrombosis.  Cranial CT scan was again repeated showing no acute pathology.  He was initially loaded with Keppra.  EEG showed no seizure activity and Keppra has since been discontinued.  Admission chemistries potassium 2.7-3.0, glucose 112, WBC 19,000, lactic acid 4.4, alcohol level less than 10, ammonia level 58, urine drug screen positive for marijuana.  Patient did undergo distal embolization to top of the basilar status post IR with left LV stenting per interventional radiology.  Currently maintained on aspirin 81 mg daily as well as Brilinta 90 mg twice daily.  Subcutaneous Lovenox for DVT prophylaxis.  Initially with Cortrak feeding tube diet is since been advanced to regular.  Bouts of restlessness agitation monitor closely on Seroquel nightly.  Patient transferred to CIR on 08/05/2021 .   Patient currently requires max with mobility secondary to muscle weakness, decreased cardiorespiratoy endurance, ataxia, decreased coordination, and decreased motor planning, decreased visual motor skills, decreased attention to left, and decreased sitting balance, decreased standing balance, decreased postural control, hemiplegia, and decreased balance strategies.  Prior to hospitalization, patient was independent  with mobility and lived with Family (Pt reports he will go to his sisters house) in a House home.  Home access is  Level entry.  Patient will benefit from skilled PT intervention to maximize safe functional mobility, minimize fall risk, and decrease caregiver burden for planned discharge home with 24 hour assist.  Anticipate patient will benefit from follow up [facility] at discharge.  PT - End of Session Activity Tolerance: Tolerates  30+ min activity with multiple  rests Endurance Deficit: Yes Endurance Deficit Description: Rest breaks with mobility tasks PT Assessment Rehab Potential (ACUTE/IP ONLY): Good PT Patient demonstrates impairments in the following area(s): Balance;Endurance;Motor;Pain;Perception;Safety;Sensory PT Transfers Functional Problem(s): Bed Mobility;Bed to Chair;Car;Furniture PT Locomotion Functional Problem(s): Ambulation;Wheelchair Mobility;Stairs PT Plan PT Intensity: Minimum of 1-2 x/day ,45 to 90 minutes PT Frequency: 5 out of 7 days PT Duration Estimated Length of Stay: 3.5-4 Weeks PT Treatment/Interventions: Ambulation/gait training;Community reintegration;DME/adaptive equipment instruction;Neuromuscular re-education;Psychosocial support;UE/LE Strength taining/ROM;Wheelchair propulsion/positioning;Stair training;Balance/vestibular training;Functional electrical stimulation;Pain management;Skin care/wound management;UE/LE Coordination activities;Therapeutic Activities;Cognitive remediation/compensation;Disease management/prevention;Patient/family education;Therapeutic Exercise;Visual/perceptual remediation/compensation;Splinting/orthotics;Functional mobility training;Discharge planning PT Transfers Anticipated Outcome(s): MinA PT Locomotion Anticipated Outcome(s): MinA Household distances PT Recommendation Follow Up Recommendations: Home health PT;24 hour supervision/assistance Patient destination: Home Equipment Recommended: To be determined   PT Evaluation Precautions/Restrictions Precautions Precautions: Fall Precaution Comments: R hemi, diplopia/visual deficits Restrictions Weight Bearing Restrictions: No Pain Interference Pain Interference Pain Effect on Sleep: 1. Rarely or not at all Pain Interference with Therapy Activities: 1. Rarely or not at all Pain Interference with Day-to-Day Activities: 1. Rarely or not at all Home Living/Prior [place] Available Help at Discharge: Family (sig other and  sister but need to clarify) Type of Home: House Home Access: Level entry Bathroom Shower/Tub: Chiropodist: Standard  Lives With: Family (Pt reports he will go to his sisters house) Prior Function Vocation: Full time employment Vision/Perception  Vision - History Ability to See in Adequate Light: 1 Impaired Vision - Assessment Eye Alignment: Impaired (comment) Perception Inattention/Neglect: Does not attend to left visual field;Does not attend to left side of body Praxis Praxis: Impaired Praxis Impairment Details: Initiation;Ideomotor;Motor planning;Perseveration  Cognition Overall Cognitive Status: Impaired/Different from baseline Arousal/Alertness: Awake/alert Orientation Level: Oriented to person;Oriented to situation;Oriented to place Sustained Attention: Impaired Sustained Attention Impairment: Verbal basic Memory: Impaired Memory Impairment: Decreased short term memory Decreased Short Term Memory: Verbal basic Awareness: Impaired Awareness Impairment: Emergent impairment Problem Solving: Impaired Problem Solving Impairment: Verbal basic Safety/Judgment: Impaired Sensation Sensation Light Touch: Impaired Detail Light Touch Impaired Details: Impaired LLE;Impaired LUE Proprioception Impaired Details: Impaired LUE;Impaired LLE Stereognosis: Impaired Detail Stereognosis Impaired Details: Impaired LLE;Impaired LUE Coordination Gross Motor Movements are Fluid and Coordinated: No Fine Motor Movements are Fluid and Coordinated: No Coordination and Movement Description: decreased smoothness and acuracy with B UE's. L UE more apraxic than R, but R UE weakness Motor  Motor Motor: Hemiplegia;Ataxia;Motor apraxia;Abnormal postural alignment and control  Trunk/Postural Assessment  Cervical Assessment Cervical Assessment:  (forward head) Thoracic Assessment Thoracic Assessment:  (rounded shoulders) Lumbar Assessment Lumbar Assessment:  (posterior pelvic  tilt) Postural Control Postural Control: Deficits on evaluation Trunk Control: Imapired Righting Reactions: Delayed and inadequate Protective Responses: Delayed and inadequate  Balance Balance Balance Assessed: Yes Static Sitting Balance Static Sitting - Balance Support: Feet supported;Bilateral upper extremity supported Static Sitting - Level of Assistance: 4: Min assist Dynamic Sitting Balance Dynamic Sitting - Balance Support: Bilateral upper extremity supported;Feet supported Dynamic Sitting - Level of Assistance: 3: Mod assist Sitting balance - Comments: Moderate assistance for sitting balance progressing to min Static Standing Balance Static Standing - Balance Support: During functional activity;Bilateral upper extremity supported Static Standing - Level of Assistance: 3: Mod assist Dynamic Standing Balance Dynamic Standing - Balance Support: Bilateral upper extremity supported;During functional activity Dynamic Standing - Level of Assistance: 2: Max assist Extremity Assessment  RLE Assessment RLE Assessment: Exceptions to [facility] General Strength Comments: Grossly 4/5 LLE Assessment General Strength Comments: Grossly 3/5  Care Tool  Care Tool Bed Mobility Roll left and right activity   Roll left and right assist level: Total Assistance - Patient < 25%    Sit to lying activity   Sit to lying assist level: Maximal Assistance - Patient 25 - 49%    Lying to sitting on side of bed activity   Lying to sitting on side of bed assist level: the ability to move from lying on the back to sitting on the side of the bed with no back support.: Maximal Assistance - Patient 25 - 49%     Care Tool Transfers Sit to stand transfer Sit to stand activity did not occur: Safety/medical concerns Sit to stand assist level: Maximal Assistance - Patient 25 - 49%    Chair/bed transfer Chair/bed transfer activity did not occur: Safety/medical concerns Chair/bed transfer assist level: Maximal  Assistance - Patient 25 - 49%     Toilet transfer Toilet transfer activity did not occur: Safety/medical concerns      Scientist, product/process development transfer activity did not occur: Safety/medical concerns        Care Tool Locomotion Ambulation Ambulation activity did not occur: Safety/medical concerns Assist level: 2 helpers (+3 with WC follow) Assistive device: Hand held assist Max distance: 15'  Walk 10 feet activity   Assist level: 2 helpers (+3 WC follow) Assistive device: Hand held assist   Walk 50 feet with 2 turns activity Walk 50 feet with 2 turns activity did not occur: Safety/medical concerns      Walk 150 feet activity Walk 150 feet activity did not occur: Safety/medical concerns      Walk 10 feet on uneven surfaces activity Walk 10 feet on uneven surfaces activity did not occur: Safety/medical concerns      Stairs Stair activity did not occur: Safety/medical concerns        Walk up/down 1 step activity Walk up/down 1 step or curb (drop down) activity did not occur: Safety/medical concerns      Walk up/down 4 steps activity Walk up/down 4 steps activity did not occur: Safety/medical concerns      Walk up/down 12 steps activity Walk up/down 12 steps activity did not occur: Safety/medical concerns      Pick up small objects from floor Pick up small object from the floor (from standing position) activity did not occur: Safety/medical concerns      Wheelchair Is the patient using a wheelchair?: No          Wheel 50 feet with 2 turns activity      Wheel 150 feet activity        Refer to Care Plan for Long Term Goals  SHORT TERM GOAL WEEK 1 PT Short Term Goal 1 (Week 1): Pt will perform bed mobility with modA PT Short Term Goal 2 (Week 1): Pt will perform sit to stand transfer with minA. PT Short Term Goal 3 (Week 1): Pt will perform bed to chair transfer with modA. PT Short Term Goal 4 (Week 1): Pt will ambulate x25' with modA +2.  Recommendations for other  services: None   Skilled Therapeutic Intervention  Evaluation completed (see details above and below) with education on PT POC and goals and individual treatment initiated with focus on bed mobility, balance, transfers, and ambulation. Pt received lying supine in perpendicular orientation to bed, with legs hanging off side of bed and with pt calling for help. PT assists pt into sitting position with maxA and cues for body mechanics. Pt initially reporting back  pain but says that pain improves after several minutes. PT provides rest breaks and mobility to manage pain. Pt performs sit to stand with maxA with cues for initiation and body mechanics. Pt initially unable to transfer to [facility] due to posterior bias and motor planning deficits. Pt sits back on bed and PT explains transfer and on 2nd attempt pt is able to complete with maxA and cues for sequencing and positioning. TotalA for scoot to back of WC and pt has near complete LOB forward due to lack of trunk control and motor planning. WC transport to gym. Pt transfers from current WC to lower WC due to height and improved fit, with PT providing maxA and cues for trunk control and sequencing. Following seated rest break, pt performs sit to stand with modA +2, and ambulates x15' with maxA +2 HHA and +3 WC follow, with consistent verbal and tactile cues for weight shifting, hip and trunk extension, upright gaze to improve posture and balance, progression of both feet in reciprocal pattern. Following seated rest break, pt performs sit to stand with modA and stand pivot to tilt in space WC with maxA. Left seated in tilt in space WC with alarm intact and all needs within reach.   Mobility Bed Mobility Bed Mobility: Sit to Supine;Supine to Sit Supine to Sit: Maximal Assistance - Patient - Patient 25-49% Sit to Supine: Maximal Assistance - Patient 25-49% Transfers Transfers: Stand to Sit;Sit to Stand;Stand Pivot Transfers Sit to Stand: Moderate Assistance -  Patient 50-74% Stand to Sit: Moderate Assistance - Patient 50-74% Stand Pivot Transfers: Maximal Assistance - Patient 25 - 49% Stand Pivot Transfer Details: Tactile cues for initiation;Verbal cues for sequencing;Verbal cues for technique;Tactile cues for weight shifting;Tactile cues for sequencing;Manual facilitation for weight shifting Transfer (Assistive device): 1 person hand held assist Locomotion  Gait Ambulation: Yes Gait Assistance: Other (comment) (+3) Gait Distance (Feet): 15 Feet Assistive device: 2 person hand held assist;Other (Comment) (+3 WC follow) Gait Gait: Yes Gait Pattern: Impaired Gait Pattern: Ataxic;Decreased stride length Stairs / Additional Locomotion Stairs: No Wheelchair Mobility Wheelchair Mobility: No   Discharge Criteria: Patient will be discharged from PT if patient refuses treatment 3 consecutive times without medical reason, if treatment goals not met, if there is a change in medical status, if patient makes no progress towards goals or if patient is discharged from hospital.  The above assessment, treatment plan, treatment alternatives and goals were discussed and mutually agreed upon: by patient  Breck Coons, PT, DPT 08/06/2021, 3:47 PM

## 2021-08-07 DIAGNOSIS — D72829 Elevated white blood cell count, unspecified: Secondary | ICD-10-CM

## 2021-08-07 DIAGNOSIS — R7401 Elevation of levels of liver transaminase levels: Secondary | ICD-10-CM

## 2021-08-07 DIAGNOSIS — E785 Hyperlipidemia, unspecified: Secondary | ICD-10-CM

## 2021-08-07 MED ORDER — CHLORHEXIDINE GLUCONATE 0.12 % MT SOLN
15.0000 mL | Freq: Once | OROMUCOSAL | Status: AC
  Administered 2021-08-07: 15 mL via OROMUCOSAL
  Filled 2021-08-07: qty 15

## 2021-08-07 NOTE — Progress Notes (Signed)
Speech Language Pathology Daily Session Note  Patient Details  Name: Roy [name] MRN: [id number] Date of Birth: 08/06/1996  Today's Date: 08/07/2021 SLP Individual Time: 2993-7169 SLP Individual Time Calculation (min): 55 min  Short Term Goals: Week 1: SLP Short Term Goal 1 (Week 1): Patient will demonstrate sustained attention to functional tasks for ~10 minutes with Mod verbal cues for redirection. SLP Short Term Goal 2 (Week 1): Patient will self-monitor and correct verbal perseveration at the word level with Max A verbal and visual cues. SLP Short Term Goal 3 (Week 1): Patient will utilize diaphragmatic breathing at the phrase level with Mod A multimodal cues. SLP Short Term Goal 4 (Week 1): Patient will utilize speech intelligibility strategies at the phrase level with Max A multimodal cues to achieve ~75% intelligibility.  Skilled Therapeutic Interventions: Skilled treatment session focused on functional communication. Patient's girlfriend present and patient appeared in brighter spirits today. SLP facilitated session by positioning patient semi-reclined in bed for education and awareness regarding diaphragmatic breathing. Patient required Max A multimodal cues to monitor rate of breathing inhalation and exhalation while sustaining vowels. Session also focused on easy onset to reduce strained vocal quality and reducing verbal perseveration as patient often repeats all words or phrases X 3. Patient responds best to putting your hand straight out as a visual cue to decrease perseveration. Patient required Max A multimodal cues to utilize all strategies at the word level during a structured verbal task. Overall, patient with increased intelligibility and was ~75% intelligible at the phrase level. Patient's girlfriend verbalized understanding of all education and reported she all attempt to carryover strategies throughout the day. Patient left upright in bed with alarm on girlfriend present.  Continue with current plan of care.      Pain Pain Assessment Pain Scale: 0-10 Pain Score: 0-No pain  Therapy/Group: Individual Therapy  Roy [name] 08/07/2021, 12:46 PM

## 2021-08-07 NOTE — Progress Notes (Signed)
PROGRESS NOTE   Subjective/Complaints: Patient seen laying in bed this morning.  Girlfriend at bedside.  Indicates he slept well overnight.  He denies complaints.  ROS: Denies CP, SOB, N/V/D  Objective:   No results found. Recent Labs    08/05/21 0215 08/06/21 0504  WBC 12.0* 11.3*  HGB 12.6* 12.8*  HCT 38.0* 36.5*  PLT 502* 496*    Recent Labs    08/05/21 0215 08/06/21 0504  NA 132* 133*  K 4.2 3.7  CL 98 99  CO2 22 24  GLUCOSE 98 97  BUN 17 15  CREATININE 0.88 0.91  CALCIUM 9.5 9.5     Intake/Output Summary (Last 24 hours) at 08/07/2021 1235 Last data filed at 08/06/2021 2200 Gross per 24 hour  Intake 820 ml  Output --  Net 820 ml         Physical Exam: Vital Signs Blood pressure 108/66, pulse 67, temperature 98.2 F (36.8 C), temperature source Oral, resp. rate 18, height 5\' 2"  (1.575 m), weight 98 kg, SpO2 99 %. Constitutional: No distress . Vital signs reviewed. HENT: Normocephalic.  Atraumatic. Eyes: Disconjugate gaze. No discharge. Cardiovascular: No JVD.  RRR. Respiratory: Normal effort.  No stridor.  Bilateral clear to auscultation. GI: Non-distended.  BS +. Skin: Warm and dry.  Intact. Psych: Normal mood.  Normal behavior. Musc: No edema in extremities.  No tenderness in extremities. Neuro: Alert Severe dysarthria Motor: RUE 5, RLE 4-5/5 LUE and LLE 3+ to 4/5 with 1/2 sensation present. DTR's 1+.  Assessment/Plan: 1. Functional deficits which require 3+ hours per day of interdisciplinary therapy in a comprehensive inpatient rehab setting. Physiatrist is providing close team supervision and 24 hour management of active medical problems listed below. Physiatrist and rehab team continue to assess barriers to discharge/monitor patient progress toward functional and medical goals  Care Tool:  Bathing        Body parts bathed by helper: Right arm, Left arm, Abdomen, Chest, Buttocks,  Front perineal area, Right upper leg, Left upper leg, Face, Left lower leg, Right lower leg     Bathing assist Assist Level: Total Assistance - Patient < 25%     Upper Body Dressing/Undressing Upper body dressing   What is the patient wearing?: Hospital gown only    Upper body assist Assist Level: Total Assistance - Patient < 25%    Lower Body Dressing/Undressing Lower body dressing      What is the patient wearing?: Incontinence brief     Lower body assist Assist for lower body dressing: Total Assistance - Patient < 25%     Toileting Toileting    Toileting assist Assist for toileting: Total Assistance - Patient < 25%     Transfers Chair/bed transfer  Transfers assist  Chair/bed transfer activity did not occur: Safety/medical concerns  Chair/bed transfer assist level: Maximal Assistance - Patient 25 - 49%     Locomotion Ambulation   Ambulation assist   Ambulation activity did not occur: Safety/medical concerns  Assist level: 2 helpers (+3 with WC follow) Assistive device: Hand held assist Max distance: 15'   Walk 10 feet activity   Assist     Assist level: 2 helpers (+  3 WC follow) Assistive device: Hand held assist   Walk 50 feet activity   Assist Walk 50 feet with 2 turns activity did not occur: Safety/medical concerns         Walk 150 feet activity   Assist Walk 150 feet activity did not occur: Safety/medical concerns         Walk 10 feet on uneven surface  activity   Assist Walk 10 feet on uneven surfaces activity did not occur: Safety/medical concerns         Wheelchair     Assist Is the patient using a wheelchair?: No             Wheelchair 50 feet with 2 turns activity    Assist            Wheelchair 150 feet activity     Assist          Blood pressure 108/66, pulse 67, temperature 98.2 F (36.8 C), temperature source Oral, resp. rate 18, height 5\' 2"  (1.575 m), weight 98 kg, SpO2 99  %.  Medical Problem List and Plan: 1. 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  Antithrombotics: -DVT/anticoagulation:  Pharmaceutical: Lovenox             -antiplatelet therapy: Aspirin 81 mg daily and Brilinta 90 mg twice daily 3. Pain Management: Tylenol as needed 4. Mood: Zoloft 100 mg daily, melatonin 3 mg nightly             -antipsychotic agents: Seroquel 50 mg nightly for agitation 5. Neuropsych: This patient is capable of making decisions on his own behalf. 6. Skin/Wound Care: Routine skin checks 7. Fluids/Electrolytes/Nutrition: encourage PO  -hyponatremia 133 on 11/18 8.  Hyperlipidemia.  Lipitor 9.  History of alcohol and marijuana use.  Urine drug screen positive marijuana.  Counseling 10. Morbid Obesity- BMI 39.52- counseling and diet education 11. Elevated LFT's  -sl increase from 11/8  -pt is on lipitor--- labs ordered for Monday 12. Mild leukocytosis: trending down  Afebrile  -no s/s infection  -Labs ordered for Monday   LOS: 2 days A FACE TO FACE EVALUATION WAS PERFORMED  Diamonds Lippard Thursday 08/07/2021, 12:35 PM

## 2021-08-07 NOTE — Progress Notes (Signed)
Occupational Therapy Session Note  Patient Details  Name: Roy [name] MRN: [id number] Date of Birth: 03/05/1996  Today's Date: 08/07/2021 OT Individual Time: 1345-1410 OT Individual Time Calculation (min): 25 min    Short Term Goals: Week 1:  OT Short Term Goal 1 (Week 1): Patient will perform sit<>stand with max A of 1 and LRAD in preparation for BADL tasks OT Short Term Goal 2 (Week 1): Pt will bring eating etensil to mouth with min A OT Short Term Goal 3 (Week 1): Patient will maintain grasp on toothbrush using adaptive equipment and brush for 30 seconds OT Short Term Goal 4 (Week 1): Patient will complete 1 step of UB dressing task.   Skilled Therapeutic Interventions/Progress Updates:    Pt greeted at time of session up in TIS chair, no pain reported. Difficulty with verbal communication throughout, cues for pacing and pt able to relay messages. Transported room <> ortho gym dependent, initially focused on attempting BUE NMR/improving AROM with SCIFIT, total A to keep L hand on handle and pt able to keep R hand on handle most of the time, able to propel on level 1 forward for 2 minutes with max assist at L hand. 2 rounds of BITS activities hitting simple circle with again Mod/Max support distally under LUE, pt able to utilize RUE grossly. Encouraged individual digit extension instead of gross hits at object. Back in room set up TIS reclined, alarm on, soft call bell attached to shirt.   Therapy Documentation Precautions:  Precautions Precautions: Fall Precaution Comments: R hemi, diplopia/visual deficits Restrictions Weight Bearing Restrictions: No     Therapy/Group: Individual Therapy  Erasmo Score 08/07/2021, 12:51 PM

## 2021-08-07 NOTE — Progress Notes (Signed)
Physical Therapy Session Note  Patient Details  Name: Roy [name] MRN: [id number] Date of Birth: [birth year]  Today's Date: 08/07/2021 PT Individual Time: 1000-1100 PT Individual Time Calculation (min): 60 min   Short Term Goals: Week 1:  PT Short Term Goal 1 (Week 1): Pt will perform bed mobility with modA PT Short Term Goal 2 (Week 1): Pt will perform sit to stand transfer with minA. PT Short Term Goal 3 (Week 1): Pt will perform bed to chair transfer with modA. PT Short Term Goal 4 (Week 1): Pt will ambulate x25' with modA +2.  Skilled Therapeutic Interventions/Progress Updates:     Patient in bed with his girlfriend at bedside upon PT arrival. Patient alert and agreeable to PT session. Patient denied pain during session.  Focused session on sitting and standing balance, transfer training, pre-gait training, reduced verbal perseveration, increased enunciation, and secretion management. Provided mod-max cues for speech and secretion management throughout session.   Therapeutic Activity: Bed Mobility: Patient performed supine to sit with min-mod A for trunk and lower extremity management in a flat bed without use of bed rails. Provided mod multimodal cues for sequencing and initiation while rolling through R side-lying with use of bottom arm to push up to sitting. Transfers: Patient performed stand pivot bed>TIS w/c and TIS w/c<>mat table with mod-max A. He performed  sit to/from stand x3 in the // bars with min-mod A. Provided verbal cues for scooting forward, foot placement, forward weight shift (required multiple trials to stand x3 due to posterior bias), and sequencing and weight shift for stepping during turns. Noted patient in 18"x18" TIS w/c with poor fit at hips and for management of increased B hip abduction in sitting. Swaped out current TIS w/c for 20"x20" TIS w/c with foam cushion during session for improved sitting tolerance and reduced pressure at hips and lateral thighs.    Neuromuscular Re-ed: Patient performed the following sitting and standing balance and pre-gait activities: -sitting balance EOB >4 min and EOM <2 min with min A progressing to supervision then reaching within BOS with B upper extremities focused on trunk control and force production with L arm and hand -standing balance 2x20-30 sec in // bars with B upper extremity support demonstrates R pelvic rotation and increased trunk extension in standing -progressed to lateral sway with weight shift to small steps forward in // bars x8 up to 6 feet with +2 for w/c follow  Patient in 20"x20" TIS w/c in the room at end of session with breaks locked, seat belt alarm set, and all needs within reach. Nursing staff informed that patient is sitting up and will need intermittent supervision for positioning and +2 assist using the Stedy to transfer back to bed.   Therapy Documentation Precautions:  Precautions Precautions: Fall Precaution Comments: R hemi, diplopia/visual deficits Restrictions Weight Bearing Restrictions: No   Therapy/Group: Individual Therapy  Gale Hulse L Asta Corbridge PT, DPT  08/07/2021, 4:20 PM

## 2021-08-07 NOTE — Progress Notes (Signed)
Physical Therapy Session Note  Patient Details  Name: Roy [name] MRN: [id number] Date of Birth: 03/06/1996  Today's Date: 08/07/2021 PT Individual Time: 1540-1615 PT Individual Time Calculation (min): 35 min   Short Term Goals: Week 1:  PT Short Term Goal 1 (Week 1): Pt will perform bed mobility with modA PT Short Term Goal 2 (Week 1): Pt will perform sit to stand transfer with minA. PT Short Term Goal 3 (Week 1): Pt will perform bed to chair transfer with modA. PT Short Term Goal 4 (Week 1): Pt will ambulate x25' with modA +2.  Skilled Therapeutic Interventions/Progress Updates:   Pt received supine in bed and agreeable to therapy session. Supine>sitting R EOB, HOB partially elevated, with heavy mod assist for trunk upright and then heavy min assist for scooting hips towards EOB with cuing and increased time to allow pt an opportunity to motor plan. Throughout session, pt noted to have episodes of increased respiratory rate but denies symptoms and pt also frequently complains of being hot and noticed he started sweating on the forehead - cool washcloth provided and pt utilizing fan in room. L squat pivot EOB>TIS w/c with heavy max assist of 1 primarily for pivoting hips - requires repeated cuing for anterior trunk lean and rotating pelvis - despite this being a lower W/C pt still not able to reach the floor fully making it challenging to scoot hips backwards in seat. Pt suddenly reports need to use bathroom. Retrieved stedy with +2 assistance. Sit>stand in stedy min assist with pt demoing ability to power up into standing - cuing for L hand placement onto stedy bar (pt with functional movement but unable to sustain grasp on the bar). Stedy transfer in/out of bathroom. Stand>sit on BSC over toilet min assist. Pt continent of bowels and bladder. Standing with light min assist for balance during total assist peri-care and LB clothing management. In stedy at sink, performed oral care dependent  assist as pt attempts to move hand to mouth without having toothbrush in his hand - also, pt demos impaired awareness by starting to spit toothpaste out without leaning forward over sink and it would have gone on his shirt if not caught by a towel. Pt also noted to have anterior spillage of saliva throughout session.  Pt also has active movement in L UE but appears to have flexor tone mixed with impaired proprioception/touch sensation to know when to reposition his arm/hand on the stedy after it has moved off of the bar. Sit>supine heavy max assist for trunk descent and B LE management into bed. Therapist assisted with B LE positioning in hooklying and pt able to scoot towards HOB. Pt left supine in bed with needs in reach, soft call button attached to his shirt, bed alarm on, and HOB elevated.  Therapy Documentation Precautions:  Precautions Precautions: Fall Precaution Comments: R hemi, diplopia/visual deficits Restrictions Weight Bearing Restrictions: No   Pain:  Denies pain during session.   Therapy/Group: Individual Therapy  Ginny Forth , PT, DPT, NCS, CSRS  08/07/2021, 3:21 PM

## 2021-08-08 NOTE — Progress Notes (Signed)
PROGRESS NOTE   Subjective/Complaints: Patient seen laying in bed this Am.  He states he slept well overnight.  He is still sleepy this AM.  Yesterday, noted to have black tooth dislodge, rinse ordered.   ROS: Denies CP, SOB, N/V/D  Objective:   No results found. Recent Labs    08/06/21 0504  WBC 11.3*  HGB 12.8*  HCT 36.5*  PLT 496*    Recent Labs    08/06/21 0504  NA 133*  K 3.7  CL 99  CO2 24  GLUCOSE 97  BUN 15  CREATININE 0.91  CALCIUM 9.5     Intake/Output Summary (Last 24 hours) at 08/08/2021 1613 Last data filed at 08/08/2021 0955 Gross per 24 hour  Intake 960 ml  Output --  Net 960 ml         Physical Exam: Vital Signs Blood pressure 102/70, pulse 90, temperature 98.1 F (36.7 C), resp. rate 15, height 5\' 2"  (1.575 m), weight 98 kg, SpO2 99 %. Constitutional: No distress . Vital signs reviewed. HENT: Normocephalic.  Atraumatic. Eyes: EOMI. No discharge. Cardiovascular: No JVD.  RRR. Respiratory: Normal effort.  No stridor.  Bilateral clear to auscultation. GI: Non-distended.  BS +. Skin: Warm and dry.  Intact. Psych: Normal mood.  Normal behavior. Musc: No edema in extremities.  No tenderness in extremities. Neuro: Alert Severe dysarthria, unchanged Motor: RUE 5/5, RLE 4-5/5 LUE and LLE 3+ to 4/5 with 1/2 sensation present. DTR's 1+.  Assessment/Plan: 1. Functional deficits which require 3+ hours per day of interdisciplinary therapy in a comprehensive inpatient rehab setting. Physiatrist is providing close team supervision and 24 hour management of active medical problems listed below. Physiatrist and rehab team continue to assess barriers to discharge/monitor patient progress toward functional and medical goals  Care Tool:  Bathing        Body parts bathed by helper: Right arm, Left arm, Abdomen, Chest, Buttocks, Front perineal area, Right upper leg, Left upper leg, Face, Left  lower leg, Right lower leg     Bathing assist Assist Level: Total Assistance - Patient < 25%     Upper Body Dressing/Undressing Upper body dressing   What is the patient wearing?: Hospital gown only    Upper body assist Assist Level: Total Assistance - Patient < 25%    Lower Body Dressing/Undressing Lower body dressing      What is the patient wearing?: Incontinence brief     Lower body assist Assist for lower body dressing: Total Assistance - Patient < 25%     Toileting Toileting    Toileting assist Assist for toileting: Total Assistance - Patient < 25%     Transfers Chair/bed transfer  Transfers assist  Chair/bed transfer activity did not occur: Safety/medical concerns  Chair/bed transfer assist level: Maximal Assistance - Patient 25 - 49%     Locomotion Ambulation   Ambulation assist   Ambulation activity did not occur: Safety/medical concerns  Assist level: 2 helpers (+3 with WC follow) Assistive device: Hand held assist Max distance: 15'   Walk 10 feet activity   Assist     Assist level: 2 helpers (+3 WC follow) Assistive device: Hand held assist  Walk 50 feet activity   Assist Walk 50 feet with 2 turns activity did not occur: Safety/medical concerns         Walk 150 feet activity   Assist Walk 150 feet activity did not occur: Safety/medical concerns         Walk 10 feet on uneven surface  activity   Assist Walk 10 feet on uneven surfaces activity did not occur: Safety/medical concerns         Wheelchair     Assist Is the patient using a wheelchair?: No             Wheelchair 50 feet with 2 turns activity    Assist            Wheelchair 150 feet activity     Assist          Blood pressure 102/70, pulse 90, temperature 98.1 F (36.7 C), resp. rate 15, height 5\' 2"  (1.575 m), weight 98 kg, SpO2 99 %.  Medical Problem List and Plan: 1. 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  Antithrombotics: -DVT/anticoagulation:  Pharmaceutical: Lovenox             -antiplatelet therapy: Aspirin 81 mg daily and Brilinta 90 mg twice daily 3. Pain Management: Tylenol as needed 4. Mood: Zoloft 100 mg daily, melatonin 3 mg nightly             -antipsychotic agents: Seroquel 50 mg nightly for agitation 5. Neuropsych: This patient is ?fully capable of making decisions on his own behalf. 6. Skin/Wound Care: Routine skin checks 7. Fluids/Electrolytes/Nutrition: encourage PO  -hyponatremia 133 on 11/18 8.  Hyperlipidemia.  Lipitor 9.  History of alcohol and marijuana use.  Urine drug screen positive marijuana.  Counseling 10. Morbid Obesity- BMI 39.52- counseling and diet education 11. Elevated LFT's  -sl increase from 11/8  -pt is on lipitor--- labs ordered for tomorrow 12. Mild leukocytosis: trending down  Afebrile  -no s/s infection  -Labs ordered for tomorrow   LOS: 3 days A FACE TO FACE EVALUATION WAS PERFORMED  Clarann Helvey 13/8 08/08/2021, 4:13 PM

## 2021-08-08 NOTE — Progress Notes (Signed)
Physical Therapy Session Note  Patient Details  Name: Roy [name] MRN: [id number] Date of Birth: 04/27/1996  Today's Date: 08/08/2021 PT Individual Time: 0905-1000 PT Individual Time Calculation (min): 55 min   Short Term Goals: Week 1:  PT Short Term Goal 1 (Week 1): Pt will perform bed mobility with modA PT Short Term Goal 2 (Week 1): Pt will perform sit to stand transfer with minA. PT Short Term Goal 3 (Week 1): Pt will perform bed to chair transfer with modA. PT Short Term Goal 4 (Week 1): Pt will ambulate x25' with modA +2.  Skilled Therapeutic Interventions/Progress Updates: Pt presented in bed agreeable to therapy with SO present. Pt c/o that he's unable to move anything but pt able to move all 4 extremities throughout session. PTA threaded pants total A for time management and pt attempted to perform bridge to pull pants over hips however unable to clear bed fully therefore performed rolling L/R with modA and max multimodal cues for sequencing. Pt then performed supine to sit with modA and use of bed features. Pt required HOH assist to reach bed rail with LUE but was unable to maintain grasp. Pt demonstrated fair sitting balance at EOB with intermittent posterior lean as PTA positioned self for transfer. Performed stand pivot transfer with maxA and PTA facilitating weight shifting to shift feet. Due to pt's height unable to use BLE to scoot back in TIS however once leg rests applied pt able follow cues to lean forward and scoot hips back. PTA noted pt not using BUE to facilitate scoot. Pt then transported to rehab gym and participated in Sit to stand in parallel bars for NMR via forced use. Pt able to perform x 3 Sit to stand with modA and stand approx ~1/1:30 min each bout. Pt required multimodal cues for erect posture and anterior translation of hips. Pt was able to maintain grip with RUE but noted extensor synergy in LUE. After standing trials pt then participate din NMR via forced use  reaching and placing bean bags on tray with emphasis on crossing midline. Pt did require HOH assist for reaching and verbal cues to "open hand" to grasp object L>R. Pt noted hand weights and verbalized that he wanted to use them. Explained that he has decent strength and that the communication between brain and body is disrupted with pt verbalizing some understanding. Pt  transported back to room at end of session and agreeable to remain in TIS for a bit and expressed to SO that nsg can put pt back in bed when ready. Pt left in TIS with belt alarm on, soft touch bell within reach and needs met.      Therapy Documentation Precautions:  Precautions Precautions: Fall Precaution Comments: R hemi, diplopia/visual deficits Restrictions Weight Bearing Restrictions: No General:   Vital Signs: Therapy Vitals Temp: 98.1 F (36.7 C) Pulse Rate: 90 Resp: 15 BP: 102/70 Patient Position (if appropriate): Lying Oxygen Therapy SpO2: 99 % O2 Device: Room Air Pain:   Mobility:   Locomotion :    Trunk/Postural Assessment :    Balance:   Exercises:   Other Treatments:      Therapy/Group: Individual Therapy    08/08/2021, 3:49 PM  

## 2021-08-09 LAB — COMPREHENSIVE METABOLIC PANEL
ALT: 159 U/L — ABNORMAL HIGH (ref 0–44)
AST: 78 U/L — ABNORMAL HIGH (ref 15–41)
Albumin: 3.6 g/dL (ref 3.5–5.0)
Alkaline Phosphatase: 87 U/L (ref 38–126)
Anion gap: 8 (ref 5–15)
BUN: 12 mg/dL (ref 6–20)
CO2: 28 mmol/L (ref 22–32)
Calcium: 9.6 mg/dL (ref 8.9–10.3)
Chloride: 100 mmol/L (ref 98–111)
Creatinine, Ser: 0.98 mg/dL (ref 0.61–1.24)
GFR, Estimated: >60 mL/min (ref >60)
Glucose, Bld: 93 mg/dL (ref 70–99)
Potassium: 4.1 mmol/L (ref 3.5–5.1)
Sodium: 136 mmol/L (ref 135–145)
Total Bilirubin: 0.6 mg/dL (ref 0.3–1.2)
Total Protein: 7.5 g/dL (ref 6.5–8.1)

## 2021-08-09 LAB — CBC
HCT: 38.6 % — ABNORMAL LOW (ref 39.0–52.0)
Hemoglobin: 12.8 g/dL — ABNORMAL LOW (ref 13.0–17.0)
MCH: 29.9 pg (ref 26.0–34.0)
MCHC: 33.2 g/dL (ref 30.0–36.0)
MCV: 90.2 fL (ref 80.0–100.0)
Platelets: 519 10*3/uL — ABNORMAL HIGH (ref 150–400)
RBC: 4.28 MIL/uL (ref 4.22–5.81)
RDW: 13 % (ref 11.5–15.5)
WBC: 10.1 10*3/uL (ref 4.0–10.5)
nRBC: 0 % (ref 0.0–0.2)

## 2021-08-09 NOTE — Care Management (Signed)
[facility] Individual Statement of Services  Patient Name:  Curby Carswell  Date:  08/09/2021  Welcome to the [facility].  Our goal is to provide you with an individualized program based on your diagnosis and situation, designed to meet your specific needs.  With this comprehensive rehabilitation program, you will be expected to participate in at least 3 hours of rehabilitation therapies Monday-Friday, with modified therapy programming on the weekends.  Your rehabilitation program will include the following services:  Physical Therapy (PT), Occupational Therapy (OT), Speech Therapy (ST), 24 hour per day rehabilitation nursing, Therapeutic Recreaction (TR), Psychology, Neuropsychology, Care Coordinator, Rehabilitation Medicine, Nutrition Services, Pharmacy Services, and Other  Weekly team conferences will be held on Tuesdays to discuss your progress.  Your Inpatient Rehabilitation Care Coordinator will talk with you frequently to get your input and to update you on team discussions.  Team conferences with you and your family in attendance may also be held.  Expected length of stay: 3.5-4 weeks    Overall anticipated outcome: Minimal Assistance  Depending on your progress and recovery, your program may change. Your Inpatient Rehabilitation Care Coordinator will coordinate services and will keep you informed of any changes. Your Inpatient Rehabilitation Care Coordinator's name and contact numbers are listed  below.  The following services may also be recommended but are not provided by the Inpatient [facility]:  Driving Evaluations Home Health Rehabiltiation Services Outpatient Rehabilitation Services Vocational Rehabilitation   Arrangements will be made to provide these services after discharge if needed.  Arrangements include referral to agencies that provide these services.  Your insurance has been verified to be:  Uninsured  Your  primary doctor is:  No PCP  Pertinent information will be shared with your doctor and your insurance company.  Inpatient Rehabilitation Care Coordinator:  Susie Cassette [phone] or ([phone]  Information discussed with and copy given to patient by: Gretchen Short, 08/09/2021, 10:11 AM

## 2021-08-09 NOTE — Progress Notes (Signed)
Physical Therapy Session Note  Patient Details  Name: Roy [name] MRN: [id number] Date of Birth: November 08, 1995  Today's Date: 08/09/2021 PT Individual Time: 0805-0930 PT Individual Time Calculation (min): 85 min   Short Term Goals: Week 1:  PT Short Term Goal 1 (Week 1): Pt will perform bed mobility with modA PT Short Term Goal 2 (Week 1): Pt will perform sit to stand transfer with minA. PT Short Term Goal 3 (Week 1): Pt will perform bed to chair transfer with modA. PT Short Term Goal 4 (Week 1): Pt will ambulate x25' with modA +2.  Skilled Therapeutic Interventions/Progress Updates:     Pt received supine in bed and agrees to therapy. Reports pain and tightness in low back. No number provided. PT provides rest breaks as needed to manage pain, as well as mobilization and soft tissue stretching. Pt performs bed mobility with modA and cues for hand placement and sequencing. Sit to stand from EOB with heavy modA. PT and girlfriend assist pt to pull up shorts. Pt reports that he needs a clean brief. Sit to stand to [place] with minA. Pt remains standing in stedy while PT changes brief. WC transport to gym for time management. Pt performs multiple reps of sit to stand in front of mirror holding onto parallel bar. ModA required and mirror used for visual feedback. Pt eventually able to stand without upper extremity support, with PT providing minA for stability and cues for posture.   Dois Davenport, OT, arrives to session for assistance as her schedule allows. Pt performs hand over hand "crawl" onto mat and assisted into tall kneeling with maxA +2. [name] arms supported on blue platform. PT provides modA for trunk extension and OT assists with stabilizing platform and positioning pt's arms for optimal WB and support. Pt transitions to having both forearms on exercise ball, rolling ball forward and backward to engage core and provide WB through upper extremities. Pt also performs "cat-cow" sequence with arms  propped on ball, with emphasis on providing core stabilization as well as soft tissue lengthening in low back.   Following seated rest break, pt performs sit to stand and ambulates x25' with maxA +2, with consistent manual support at chest due forward lean, and multimodal cueing for step progression. WC transport back to room. Left seated in tilt in space WC with alarm intact and all needs within reach.  Therapy Documentation Precautions:  Precautions Precautions: Fall Precaution Comments: R hemi, diplopia/visual deficits Restrictions Weight Bearing Restrictions: No    Therapy/Group: Individual Therapy  Beau Fanny, PT, DPT 08/09/2021, 4:03 PM

## 2021-08-09 NOTE — Progress Notes (Signed)
PROGRESS NOTE   Subjective/Complaints:  Working with OT, no new issues   ROS: Limited by cognition and severe dysarthria   Objective:   No results found. Recent Labs    08/09/21 0602  WBC 10.1  HGB 12.8*  HCT 38.6*  PLT 519*    Recent Labs    08/09/21 0602  NA 136  K 4.1  CL 100  CO2 28  GLUCOSE 93  BUN 12  CREATININE 0.98  CALCIUM 9.6     Intake/Output Summary (Last 24 hours) at 08/09/2021 1101 Last data filed at 08/09/2021 0900 Gross per 24 hour  Intake 480 ml  Output --  Net 480 ml         Physical Exam: Vital Signs Blood pressure (!) 141/82, pulse 78, temperature 98.5 F (36.9 C), resp. rate 18, height 5\' 2"  (1.575 m), weight 98 kg, SpO2 100 %.  General: No acute distress Mood and affect are appropriate Heart: Regular rate and rhythm no rubs murmurs or extra sounds Lungs: Clear to auscultation, breathing unlabored, no rales or wheezes Abdomen: Positive bowel sounds, soft nontender to palpation, nondistended Extremities: No clubbing, cyanosis, or edema Skin: No evidence of breakdown, no evidence of rash   Musc: No edema in extremities.  No tenderness in extremities. Neuro: Alert, partial INO Left pupil  Severe dysarthria, unchanged Motor: RUE 5/5, RLE 4-5/5 LUE and LLE 3+ to 4/5 with 1/2 sensation present. DTR's 1+. Ataxia L>R UE  Assessment/Plan: 1. Functional deficits which require 3+ hours per day of interdisciplinary therapy in a comprehensive inpatient rehab setting. Physiatrist is providing close team supervision and 24 hour management of active medical problems listed below. Physiatrist and rehab team continue to assess barriers to discharge/monitor patient progress toward functional and medical goals  Care Tool:  Bathing        Body parts bathed by helper: Right arm, Left arm, Abdomen, Chest, Buttocks, Front perineal area, Right upper leg, Left upper leg, Face, Left lower  leg, Right lower leg     Bathing assist Assist Level: Total Assistance - Patient < 25%     Upper Body Dressing/Undressing Upper body dressing   What is the patient wearing?: Hospital gown only    Upper body assist Assist Level: Total Assistance - Patient < 25%    Lower Body Dressing/Undressing Lower body dressing      What is the patient wearing?: Incontinence brief     Lower body assist Assist for lower body dressing: Total Assistance - Patient < 25%     Toileting Toileting    Toileting assist Assist for toileting: Total Assistance - Patient < 25%     Transfers Chair/bed transfer  Transfers assist  Chair/bed transfer activity did not occur: Safety/medical concerns  Chair/bed transfer assist level: Maximal Assistance - Patient 25 - 49%     Locomotion Ambulation   Ambulation assist   Ambulation activity did not occur: Safety/medical concerns  Assist level: 2 helpers (+3 with WC follow) Assistive device: Hand held assist Max distance: 15'   Walk 10 feet activity   Assist     Assist level: 2 helpers (+3 WC follow) Assistive device: Hand held assist   Walk  50 feet activity   Assist Walk 50 feet with 2 turns activity did not occur: Safety/medical concerns         Walk 150 feet activity   Assist Walk 150 feet activity did not occur: Safety/medical concerns         Walk 10 feet on uneven surface  activity   Assist Walk 10 feet on uneven surfaces activity did not occur: Safety/medical concerns         Wheelchair     Assist Is the patient using a wheelchair?: No             Wheelchair 50 feet with 2 turns activity    Assist            Wheelchair 150 feet activity     Assist          Blood pressure (!) 141/82, pulse 78, temperature 98.5 F (36.9 C), resp. rate 18, height 5\' 2"  (1.575 m), weight 98 kg, SpO2 100 %.  Medical Problem List and Plan: 1. 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  Antithrombotics: -DVT/anticoagulation:  Pharmaceutical: Lovenox             -antiplatelet therapy: Aspirin 81 mg daily and Brilinta 90 mg twice daily 3. Pain Management: Tylenol as needed 4. Mood: Zoloft 100 mg daily, melatonin 3 mg nightly             -antipsychotic agents: Seroquel 50 mg nightly for agitation 5. Neuropsych: This patient is ?fully capable of making decisions on his own behalf. 6. Skin/Wound Care: Routine skin checks 7. Fluids/Electrolytes/Nutrition: encourage PO  -hyponatremia 133 on 11/18 8.  Hyperlipidemia.  Lipitor 9.  History of alcohol and marijuana use.  Urine drug screen positive marijuana.  Counseling 10. Morbid Obesity- BMI 39.52- counseling and diet education 11. Elevated LFT's  -sl increase from 11/8  -pt is on lipitor--- labs ordered for tomorrow 12. Mild leukocytosis: trending down  Afebrile  -no s/s infection  -Labs ordered for tomorrow   LOS: 4 days A FACE TO FACE EVALUATION WAS PERFORMED  13/8 08/09/2021, 11:01 AM

## 2021-08-09 NOTE — Progress Notes (Signed)
Last charted BM was 08/05/2021. Verified with pt's significant other who states pt had a BM 08/07/2021, but reports pt had frequent constipation before his hospitalization.

## 2021-08-09 NOTE — Progress Notes (Signed)
Occupational Therapy Session Note  Patient Details  Name: Roy [name] MRN: [id number] Date of Birth: 1996/05/28  Today's Date: 08/09/2021 OT Individual Time: 1033-1130 OT Individual Time Calculation (min): 57 min   Short Term Goals: Week 1:  OT Short Term Goal 1 (Week 1): Patient will perform sit<>stand with max A of 1 and LRAD in preparation for BADL tasks OT Short Term Goal 2 (Week 1): Pt will bring eating etensil to mouth with min A OT Short Term Goal 3 (Week 1): Patient will maintain grasp on toothbrush using adaptive equipment and brush for 30 seconds OT Short Term Goal 4 (Week 1): Patient will complete 1 step of UB dressing task.  Skilled Therapeutic Interventions/Progress Updates:    Pt greeted seated in TIS wc and had spilled ensure all over the floor and on his lower body. Bathing/dressing tasks completed from wc at the sink. Worked on functional use of B UE's within UB bathing tasks. Pt with weakness on R side and ataxia on L side. Pt was able to bring wash cloth to face today, but very uncoordinated and only able to keep it up there for short periods of time. Multimodal cues to maintain attention to task as pt getting distracted by his reflection saying " I look good, I look good, I look good" Overall total A for UB ADLs, but able to assist 2ith ~ 20% of task. Sit<>stajnd in [place] with OT assist to get B UE's to the bar, then max A to stand. Pt incontinent of urine in brief requiring total A for peri-care and brief change, but able to maintain standing during this task with min/mod A in [place]. Pt left seated in TIS wc at end of session with alarm belt on, call bell in reach, and needs met.   Therapy Documentation Precautions:  Precautions Precautions: Fall Precaution Comments: R hemi, diplopia/visual deficits Restrictions Weight Bearing Restrictions: No Pain: Pain Assessment Pain Scale: 0-10 Pain Score: 0-No pain   Therapy/Group: Individual Therapy  Valma Cava 08/09/2021, 11:34 AM

## 2021-08-09 NOTE — Progress Notes (Signed)
Patient ID: Roy [name], male   DOB: 1995-12-12, 25 y.o.   MRN: [id number]  SW returned phone call/left message for pt aunt Constance Goltz [phone]).    Cecile Sheerer, MSW, LCSWA Office: [phone] Cell: [phone] Fax: [phone]

## 2021-08-09 NOTE — Progress Notes (Signed)
Speech Language Pathology Daily Session Note  Patient Details  Name: Roy [name] MRN: [id number] Date of Birth: September 30, 1995  Today's Date: 08/09/2021 SLP Individual Time: 1400-1425 SLP Individual Time Calculation (min): 25 min  Short Term Goals: Week 1: SLP Short Term Goal 1 (Week 1): Patient will demonstrate sustained attention to functional tasks for ~10 minutes with Mod verbal cues for redirection. SLP Short Term Goal 2 (Week 1): Patient will self-monitor and correct verbal perseveration at the word level with Max A verbal and visual cues. SLP Short Term Goal 3 (Week 1): Patient will utilize diaphragmatic breathing at the phrase level with Mod A multimodal cues. SLP Short Term Goal 4 (Week 1): Patient will utilize speech intelligibility strategies at the phrase level with Max A multimodal cues to achieve ~75% intelligibility.  Skilled Therapeutic Interventions: Skilled treatment session focused on communication goals. SLP facilitated session by providing Max A verbal and visual cues for utilization of slow diaphragmatic breathing while at rest. Max verbal cues were also needed for use of over-articulation and monitoring phonemic errors as well managing verbal perseveration. Patient would repeat some words and phrases up to 6 times in a row. Patient appears tense throughout his body during verbal expression also causing a strained vocal quality. Patient communicated his menu choices at the word and phrase level with an unfamiliar communication partner and was ~50% intelligible. Patient left upright in bed with alarm on and all needs within reach. Continue with current plan of care.      Pain No/Denies Pain   Therapy/Group: Individual Therapy  Indyah Saulnier 08/09/2021, 3:53 PM

## 2021-08-10 NOTE — Progress Notes (Signed)
Patient ID: Roy [name], male   DOB: Aug 08, 1996, 25 y.o.   MRN: [id number]  SW met with pt, and pt older sister Roy [name] and her children in room. SW provided pt with brief updates, and d/c date 12/16. Pt aware SW will follow-up with his aunt Roy [name].   SW spoke with pt aunt Roy [name] [phone]) to provide updates from team and d/c date. She confirms she will be primary contact. Updated demo sheet. SW dicussed concerns related to pt being uninsured and barriers for DME needs and ongoing therapy services needed at discharge. SW encouraged her to look for a w/c, RW, DABSC, and TTB. SW explained [facility] medication assistance program, and HH or OPT will be determined closer towards discharge. SW informed will follow-up once able to determine if he will have a disability greater than 12 months. SW informed will continue to provide updates as they are available. SW sent email to review conversation at her request: [email or web address]  Roy [name], MSW, [place] Office: [phone] Cell: [phone] Fax: [phone]

## 2021-08-10 NOTE — Progress Notes (Signed)
Physical Therapy Session Note  Patient Details  Name: Roy [name] MRN: [id number] Date of Birth: 1995/12/30  Today's Date: 08/10/2021 PT Individual Time: 1300-1400 PT Individual Time Calculation (min): 60 min   Short Term Goals: Week 1:  PT Short Term Goal 1 (Week 1): Pt will perform bed mobility with modA PT Short Term Goal 2 (Week 1): Pt will perform sit to stand transfer with minA. PT Short Term Goal 3 (Week 1): Pt will perform bed to chair transfer with modA. PT Short Term Goal 4 (Week 1): Pt will ambulate x25' with modA +2.  Skilled Therapeutic Interventions/Progress Updates:     Pt reclined in TIS w/c on arrival. Awake and agreeable to PT tx. Does not report of any pain at start of session. Pt transported in w/c to main rehab gym.   Completes sit<>stand with no AD with modA - requires assist for positioning LE's due to B extension bias. Completes stand<>pivot transfer with modA of 1 person from w/c to mat table with max cues for sequencing and assist primarily for lateral weight shift and steadying. Pt completes 1x10 repeated sit<>stands with min/modA with facilitation for hip/trunk extension and feet positioning. MinA for static standing balance, unsupported.   Initiated gait training in rehab gym with +2 maxA, ambulated 2x37f on level ground. VC for hip/trunk extension remain persistent in gait, as well as emphasis on taking shorter steps due to ataxia and motor planning deficits. Demo's narrow BOS and sometimes will step on heels. PT facilitating pelvic rotation, lateral weight shift, and foot placement during gait. No over knee buckling but lacks knee flexion in swing on LLE.   After 2nd gait trial, pt sitting edge of mat and began to moan/scream out in pain, sweating from face also. Difficulty finding comfortable position but most comfortable in extension > flexion. Initially thought pain was back related but with questioning, able to determine he was having abdominal pain that  would be described as cramping, pointing to his L flank/side. This took a significant amount of time to determine and find relieving position.   Pt assisted back to his w/c with +2 modA stand<>pivot transfer with similar cues as above. Pt reclined in TIS w/c for comfort and then returned to his room. LPN notified via secure chat of pt's pain and response to therapy. BP checked at end of session, reading 133/84. Pt reports being comfortable in reclined TIS w/c. Safety belt alarm on, soft call bell pinned to shirt and within reach, all needs met.   Therapy Documentation Precautions:  Precautions Precautions: Fall Precaution Comments: R hemi, diplopia/visual deficits Restrictions Weight Bearing Restrictions: No General:    Therapy/Group: Individual Therapy  [name]11/22/2022, 7:29 AM

## 2021-08-10 NOTE — Progress Notes (Signed)
Occupational Therapy Session Note  Patient Details  Name: Roy [name] MRN: [id number] Date of Birth: 12/14/1995  Today's Date: 08/10/2021 OT Individual Time: 1000-1054 OT Individual Time Calculation (min): 54 min    Short Term Goals: Week 1:  OT Short Term Goal 1 (Week 1): Patient will perform sit<>stand with max A of 1 and LRAD in preparation for BADL tasks OT Short Term Goal 2 (Week 1): Pt will bring eating etensil to mouth with min A OT Short Term Goal 3 (Week 1): Patient will maintain grasp on toothbrush using adaptive equipment and brush for 30 seconds OT Short Term Goal 4 (Week 1): Patient will complete 1 step of UB dressing task.  Skilled Therapeutic Interventions/Progress Updates:  Pt greeted supine in bed agreeable to OT intervention. Session focus on BADL reeducation and functional transfers. Pt completed supine>sit with MOD A +2 with most assist needed to elevate trunk and scoot hips to EOB. Pt able to sit EOB with no UE support with CGA. Pt completed lateral scoot transfer from EOB>TIS towards L side with MAX A +2. Pt transported to sink for bathing. MAX A for bathing from w/c with pt able to wash chest and ABD but needed assist to wash UEs and all of LB d/t ataxia and RUE weakness. Pt may benefit from bath mit? Pt needed MAX A for UB dressing needing assist to thread BUEs and pull shirt [place], total A for LB Dressing to thread pants and new brief, MOD A +2 for sit<>stand with BUEs supported on sink, assist needed to pull pants up to waist line. Pt needed MAX A for oral care for hand over hand assist to brush teeth with RUE and apply paste with RUE with LUE acting as stabilizer. Total A needed to don shoes. Pt very interactive and jovial during session, laughing and making joked with RT. Pts speech continues to be dysarthric but able to decipher with increased attempts. pt left semi reclined in TIS with alarm belt activated and all need within reach. Pt able to demo how ot use soft  touch call bell.                     Therapy Documentation Precautions:  Precautions Precautions: Fall Precaution Comments: R hemi, diplopia/visual deficits Restrictions Weight Bearing Restrictions: No  Pain: no pain reported during session     Therapy/Group: Individual Therapy  Pollyann Glen [facility] 08/10/2021, 12:03 PM

## 2021-08-10 NOTE — Progress Notes (Signed)
Physical Therapy Session Note  Patient Details  Name: Roy [name] MRN: [id number] Date of Birth: May 26, 1996  Today's Date: 08/10/2021 PT Individual Time: 1101-1159 PT Individual Time Calculation (min): 58 min   Short Term Goals: Week 1:  PT Short Term Goal 1 (Week 1): Pt will perform bed mobility with modA PT Short Term Goal 2 (Week 1): Pt will perform sit to stand transfer with minA. PT Short Term Goal 3 (Week 1): Pt will perform bed to chair transfer with modA. PT Short Term Goal 4 (Week 1): Pt will ambulate x25' with modA +2.  Skilled Therapeutic Interventions/Progress Updates:     Pt received seated in [facility] and agrees to therapy. No complaint of pain. WC transport to gym for time management. Pt performs stand step transfer to [place] with maxA and facilitation of anterior weight shift to stand, lateral weight shifting to progress legs, and multimodal cueing for sequencing and positioning. Pt completes total of 10:00 on [place] with average steps per minute ~20. Pt cued to attempt to grip both handles with upper extremities and requires consistent cues to regrip, as well as cues to attend to R foot as he has tendency to supinate distal to R ankle during activity. Pt performs stand step transfer to [facility] with modA +2 with cues to transfer weight posteriorly when transitioning from stand to sit. Pt attempts Wii bowling to challenge coordination but is unable to functionally grip remote control, so activity terminated. Pt ambulates x40' with heavy modA +2, with cues at hips for extension, consistent verbal cues for progression of reciprocal gait pattern, postioning of feet during stance phase. Following extended seated rest break, pt performs multiple reps of sit to stand with mirror for visual feedback. PT provides modA for sit to stand, and minA to modA to facilitate standing balance, with multimodal cues for posture, hip and trunk extension, and upright gaze to improve balance. Pt has posterior bias  and has posterior LOB when PT allows pt to attempt standing without support, abruptly sitting back on mat. Stand step transfer back to [facility] with modA +2. Pt left seated with alarm intact and all needs within reach.  Therapy Documentation Precautions:  Precautions Precautions: Fall Precaution Comments: R hemi, diplopia/visual deficits Restrictions Weight Bearing Restrictions: No    Therapy/Group: Individual Therapy  Beau Fanny, PT, DPT 08/10/2021, 12:31 PM

## 2021-08-10 NOTE — Progress Notes (Signed)
Speech Language Pathology Daily Session Note  Patient Details  Name: Roy [name] MRN: [id number] Date of Birth: [birth year]  Today's Date: 08/10/2021 SLP Individual Time: 7824-2353 SLP Individual Time Calculation (min): 28 min  Short Term Goals: Week 1: SLP Short Term Goal 1 (Week 1): Patient will demonstrate sustained attention to functional tasks for ~10 minutes with Mod verbal cues for redirection. SLP Short Term Goal 2 (Week 1): Patient will self-monitor and correct verbal perseveration at the word level with Max A verbal and visual cues. SLP Short Term Goal 3 (Week 1): Patient will utilize diaphragmatic breathing at the phrase level with Mod A multimodal cues. SLP Short Term Goal 4 (Week 1): Patient will utilize speech intelligibility strategies at the phrase level with Max A multimodal cues to achieve ~75% intelligibility.  Skilled Therapeutic Interventions: Skilled treatment session focused on communication and cognitive goals. Upon arrival, patient was consuming his breakfast meal of regular textures with thin liquids. Patient requires total A for self-feeding but was able to alternate his attention between PO intake and functional conversation for ~20 minutes with Min verbal cues for redirection. Patient participated in a basic conversation that focused on recall of speech strategies and ongoing education regarding maximizing intelligibility. Patient required supervision level verbal cues to recall all strategies but demonstrated improved breath support throughout session today for verbal expression. Patient's voice also noted to be less strained today. Mod-Max verbal cues are required for a slow rate, over-articulation and to decrease perseveration. Overall, patient was ~70% intelligible today at the word and phrase level. Patient left upright in bed with alarm on and all needs within reach. Continue with current plan of care.      Pain Pain Assessment Pain Scale: 0-10 Pain Score:  0-No pain  Therapy/Group: Individual Therapy  Aquilla Voiles 08/10/2021, 1:21 PM

## 2021-08-10 NOTE — Progress Notes (Signed)
PROGRESS NOTE   Subjective/Complaints:  No new problems this morning. SLP working on speech with patient. Denies pain. Seems to have slept  ROS: limited d/t speech, ?cognition  Objective:   No results found. Recent Labs    08/09/21 0602  WBC 10.1  HGB 12.8*  HCT 38.6*  PLT 519*   Recent Labs    08/09/21 0602  NA 136  K 4.1  CL 100  CO2 28  GLUCOSE 93  BUN 12  CREATININE 0.98  CALCIUM 9.6    Intake/Output Summary (Last 24 hours) at 08/10/2021 1058 Last data filed at 08/10/2021 1022 Gross per 24 hour  Intake 738 ml  Output --  Net 738 ml        Physical Exam: Vital Signs Blood pressure 106/70, pulse 85, temperature 99.1 F (37.3 C), resp. rate 16, height 5\' 2"  (1.575 m), weight 98 kg, SpO2 100 %.  Constitutional: No distress . Vital signs reviewed. HEENT: NCAT, EOMI, oral membranes moist Neck: supple Cardiovascular: RRR without murmur. No JVD    Respiratory/Chest: CTA Bilaterally without wheezes or rales. Normal effort    GI/Abdomen: BS +, non-tender, non-distended Ext: no clubbing, cyanosis, or edema Psych: pleasant and cooperative  Skin: clean, dry Musc: No edema in extremities.  No tenderness in extremities. Neuro: Alert, partial INO Left pupil  Severe dysarthria is improved. Pt repeats a lot, However, which makes speech still difficult to comprehend Motor: RUE 5/5, RLE 4-5/5 LUE and LLE 3+ to 4/5 with 1/2 sensation present. DTR's 1+. Ataxia L>R UE ongoing   Assessment/Plan: 1. Functional deficits which require 3+ hours per day of interdisciplinary therapy in a comprehensive inpatient rehab setting. Physiatrist is providing close team supervision and 24 hour management of active medical problems listed below. Physiatrist and rehab team continue to assess barriers to discharge/monitor patient progress toward functional and medical goals  Care Tool:  Bathing        Body parts bathed by  helper: Right arm, Left arm, Abdomen, Chest, Buttocks, Front perineal area, Right upper leg, Left upper leg, Face, Left lower leg, Right lower leg     Bathing assist Assist Level: Total Assistance - Patient < 25%     Upper Body Dressing/Undressing Upper body dressing   What is the patient wearing?: Hospital gown only    Upper body assist Assist Level: Total Assistance - Patient < 25%    Lower Body Dressing/Undressing Lower body dressing      What is the patient wearing?: Incontinence brief     Lower body assist Assist for lower body dressing: Total Assistance - Patient < 25%     Toileting Toileting    Toileting assist Assist for toileting: Total Assistance - Patient < 25%     Transfers Chair/bed transfer  Transfers assist  Chair/bed transfer activity did not occur: Safety/medical concerns  Chair/bed transfer assist level: Maximal Assistance - Patient 25 - 49%     Locomotion Ambulation   Ambulation assist   Ambulation activity did not occur: Safety/medical concerns  Assist level: 2 helpers (+3 with WC follow) Assistive device: Hand held assist Max distance: 15'   Walk 10 feet activity   Assist  Assist level: 2 helpers (+3 WC follow) Assistive device: Hand held assist   Walk 50 feet activity   Assist Walk 50 feet with 2 turns activity did not occur: Safety/medical concerns         Walk 150 feet activity   Assist Walk 150 feet activity did not occur: Safety/medical concerns         Walk 10 feet on uneven surface  activity   Assist Walk 10 feet on uneven surfaces activity did not occur: Safety/medical concerns         Wheelchair     Assist Is the patient using a wheelchair?: No             Wheelchair 50 feet with 2 turns activity    Assist            Wheelchair 150 feet activity     Assist          Blood pressure 106/70, pulse 85, temperature 99.1 F (37.3 C), resp. rate 16, height 5\' 2"  (1.575  m), weight 98 kg, SpO2 100 %.  Medical Problem List and Plan: 1. 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 Interdisciplinary team conference today to discuss goals, barriers to discharge, and dc planning.   2.  Antithrombotics: -DVT/anticoagulation:  Pharmaceutical: Lovenox             -antiplatelet therapy: Aspirin 81 mg daily and Brilinta 90 mg twice daily 3. Pain Management: Tylenol as needed 4. Mood: Zoloft 100 mg daily, melatonin 3 mg nightly             -antipsychotic agents: Seroquel 50 mg nightly for agitation  -mood seems better controlled 5. Neuropsych: This patient is not fully capable of making decisions on his own behalf. 6. Skin/Wound Care: Routine skin checks 7. Fluids/Electrolytes/Nutrition: encourage PO  -hyponatremia 136 11/21 8.  Hyperlipidemia.  Lipitor 9.  History of alcohol and marijuana use.  Urine drug screen positive marijuana.  Counseling 10. Morbid Obesity- BMI 39.52- counseling and diet education 11. Elevated LFT's  11/22 -further increase in AST/ALT today  -pt is on lipitor--- will hold 12. Mild leukocytosis: trending down  Afebrile  -no s/s infection  -improved today 11/22 LOS: 5 days A FACE TO FACE EVALUATION WAS PERFORMED  12/22 08/10/2021, 10:58 AM

## 2021-08-10 NOTE — Patient Care Conference (Signed)
Inpatient RehabilitationTeam Conference and Plan of Care Update Date: 08/10/2021   Time: 10:33 AM    Patient Name: Roy [name]      Medical Record Number: [id number]  Date of Birth: 04/26/1996 Sex: Male         Room/Bed: 4W20C/4W20C-01 Payor Info: Payor: MEDICAID POTENTIAL / Plan: MEDICAID POTENTIAL / Product Type: *No Product type* /    Admit Date/Time:  08/05/2021  2:14 PM  Primary Diagnosis:  Posterior circulation stroke [facility])  Hospital Problems: Principal Problem:   Posterior circulation stroke [facility]) Active Problems:   Leukocytosis   Transaminitis   Dyslipidemia    Expected Discharge Date: Expected Discharge Date: 09/03/21  Team Members Present: Physician leading conference: Dr. Faith Rogue Social Worker Present: Cecile Sheerer, LCSWA Nurse Present: Kennyth Arnold, RN PT Present: Malachi Pro, PT OT Present: Jake Shark, OT SLP Present: Feliberto Gottron, SLP PPS Coordinator present : Fae Pippin, SLP     Current Status/Progress Goal Weekly Team Focus  Bowel/Bladder   incont. of B&B LBM 11/19  regain contenience  I7O cath if no void q 8 hr   Swallow/Nutrition/ Hydration             ADL's   Total A for BADL tasks 2/2 global UE weakness and ataxia. Mod/max A sit<>stands in [place]  Min A  UB NMR, transfers, sit<>stand, activity tolerance, cognitive retraining, self-care retraining   Mobility   modA/maxA bed mobility, modA sit to stand, modA +2 x50' gait with HHA  minA  bed mobility, balance, NMR, coordination, ambulation   Communication   max A  Min A  use of speech intelligibility strategies, self-monitoring verbal perseveration, controlled breathing during verbal expression   Safety/Cognition/ Behavioral Observations  Mod-Max A  Min A  sustained attention, emergent awareness, basic problem solving   Pain   no complaints of pain  pain <3  assess q shift and prn   Skin   no sking break down  prevent new skin breakdown  assess q shift and prn      Discharge Planning:  Pt is uninsured. Pt to d/c to home with his sister who does not work. Support from other family as well. SW will confirm no barriers to discharge.   Team Discussion: Vertebral artery dissection. BP doing well, no pain reported. Incontinent bladder, continent bowel, will begin timed toileting schedule. Uninsured and discharging home with sister. Incision at groin is CDI. Has frequent constipation PTA. Total assist for ADL's. Mod assist STS, walked 50 ft with mod assist +2. Working on Systems developer, and attention issues. Patient stutters at baseline.  Patient on target to meet rehab goals: Min assist goals  *See Care Plan and progress notes for long and short-term goals.   Revisions to Treatment Plan:  Continue to monitor medications  Teaching Needs: Family education, medication management, B/B management, skin/wound care, transfer training, etc.  Current Barriers to Discharge: Decreased caregiver support, Incontinence, Wound care, Lack of/limited family support, Insurance for SNF coverage, Weight, and Medication compliance  Possible Resolutions to Barriers: Family education Follow up PT/OT Order recommended DME     Medical Summary Current Status: vertebral artery dissection with posterior circulation infarct causing balance deficits, ataxia, dysarthria. bp well controlled. behavior mproved  Barriers to Discharge: Medical stability   Possible Resolutions to [employer] Focus: daily assessment of patient data and vital signs.   Continued Need for Acute Rehabilitation Level of Care: The patient requires daily medical management by a physician with specialized training in physical medicine and rehabilitation  for the following reasons: Direction of a multidisciplinary physical rehabilitation program to maximize functional independence : Yes Medical management of patient stability for increased activity during participation in an intensive  rehabilitation regime.: Yes Analysis of laboratory values and/or radiology reports with any subsequent need for medication adjustment and/or medical intervention. : Yes   I attest that I was present, lead the team conference, and concur with the assessment and plan of the team.   Tennis Must 08/10/2021, 1:05 PM

## 2021-08-11 MED ORDER — BISACODYL 10 MG RE SUPP
10.0000 mg | Freq: Every day | RECTAL | Status: DC | PRN
  Administered 2021-08-11 – 2021-09-07 (×5): 10 mg via RECTAL
  Filled 2021-08-11 (×5): qty 1

## 2021-08-11 MED ORDER — SORBITOL 70 % SOLN
30.0000 mL | Freq: Every day | Status: DC | PRN
  Administered 2021-08-11 – 2021-09-01 (×5): 30 mL via ORAL
  Filled 2021-08-11 (×7): qty 30

## 2021-08-11 NOTE — Progress Notes (Signed)
Physical Therapy Session Note  Patient Details  Name: Roy [name] MRN: [id number] Date of Birth: 07-13-1996  Today's Date: 08/11/2021 PT Individual Time: 1105-1200 and 1435-1530 PT Individual Time Calculation (min): 55 min and 55 min  Short Term Goals: Week 1:  PT Short Term Goal 1 (Week 1): Pt will perform bed mobility with modA PT Short Term Goal 2 (Week 1): Pt will perform sit to stand transfer with minA. PT Short Term Goal 3 (Week 1): Pt will perform bed to chair transfer with modA. PT Short Term Goal 4 (Week 1): Pt will ambulate x25' with modA +2.  Skilled Therapeutic Interventions/Progress Updates:     1st Session:  Pt received supine in bed and agrees to therapy. Reports some pain in L lower quadrant of abdomen. Number not provided. PT provides repositioning and rest breaks to manage pain. Pt performs supine to sit with maxA and cues for logrolling, sequencing, and positioning at EOB. Pt performs sit to stand with maxA and rehab tech assists with pulling up shorts in standing. Stand step transfer to [facility] with maxA and cues for weight shifting. WC transport to gym. Pt ambulates x40' including a 180 degree turn with maxA +2, with verbal and tactile cueing for each step, as well as multimodal cues for posture, hip extension, and weight shifting. Pt tends to take overly long step with L leg and short step with R leg. Following seated rest break, PT places 4lb ankle weights on each leg to provide increased NM and proprioceptive feedback. Pt ambulates x50' with maxA +2 and similar gait pattern, very ataxic and requiring several brief standing breaks to regain balance and upright posture.   Pt performs multiple reps of sit to stand from mat, requiring modA initially and progressing to minA with cues for anterior weight shift, foot placement, and use of power for efficiency. Pt remains standing 2-3 minutes each rep, requiring primarily modA and cue to increase hip extension and anterior COG.    Pt performs forward and sidesteps to return to [facility], requiring maxA +2 and heavy facilitation of weight shifting. Left seated with alarm intact and all needs within reach.  2nd Session: Pt received seated in [facility] and agrees to therapy. Reports pain in abdomen but no number provided. PT provides mobility and rest breaks to manage pain. WC transport to gym for time management. Pt stands from [facility] with modA +2 with cues for initiation, body mechanics, and weight shifting. Pt performs forward "bear crawl" transfer onto mat table with maxA +2 and manual facilitation of neutral pelvic positioning and clearance of each leg onto table. Pt assisted into modified quadruped position with exercise ball underneath pt's abdomen. Ball positioned to provide pt with partial weight relief, in addition to facilitating core stability and external pressure on abdomen for pain relief. Pt performs alternating arm lifts with rehab tech stabilizing legs, then progresses to bilateral arm raises.  Pt assisted into sidelying for rest break. Pt transitions to prone with minA and performs modified press ups on elbows to provide back extension as well as WB through upper extremities. Pt progresses to performing full press ups on hands with increased hip and trunk extension, and is eventually able to transition to quadruped for several seconds at a time.   Prone to supine with max verbal cueing and only minA manual facilitatoin at L arm and trunk. Supine to sitting with maxA. Stand step transfer back to [facility] with maxA +2. Left seated with alarm intact and all needs within  reach.  Therapy Documentation Precautions:  Precautions Precautions: Fall Precaution Comments: R hemi, diplopia/visual deficits Restrictions Weight Bearing Restrictions: No   Therapy/Group: Individual Therapy  Beau Fanny, PT, DPT 08/11/2021, 3:42 PM

## 2021-08-11 NOTE — Progress Notes (Signed)
PROGRESS NOTE   Subjective/Complaints:  Pt had a good night. Reports no pain or new problems  ROS: Limited due to cognitive/behavioral   Objective:   No results found. Recent Labs    08/09/21 0602  WBC 10.1  HGB 12.8*  HCT 38.6*  PLT 519*   Recent Labs    08/09/21 0602  NA 136  K 4.1  CL 100  CO2 28  GLUCOSE 93  BUN 12  CREATININE 0.98  CALCIUM 9.6    Intake/Output Summary (Last 24 hours) at 08/11/2021 0817 Last data filed at 08/10/2021 1753 Gross per 24 hour  Intake 498 ml  Output 50 ml  Net 448 ml        Physical Exam: Vital Signs Blood pressure 124/81, pulse 82, temperature 98.1 F (36.7 C), resp. rate 20, height 5\' 2"  (1.575 m), weight 98 kg, SpO2 99 %.  Constitutional: No distress . Vital signs reviewed. HEENT: NCAT, EOMI, oral membranes moist Neck: supple Cardiovascular: RRR without murmur. No JVD    Respiratory/Chest: CTA Bilaterally without wheezes or rales. Normal effort    GI/Abdomen: BS +, non-tender, non-distended Ext: no clubbing, cyanosis, or edema Psych: pleasant and cooperative  Skin: clean, dry Musc: No edema in extremities.  No tenderness in extremities. Neuro: Alert, partial INO Left pupil --no change Severe dysarthria improving. Pt repeats a lot, However, which makes speech still difficult to comprehend Motor: RUE 5/5, RLE 4-5/5 LUE and LLE 3+ to 4/5 with 1/2 sensation present. DTR's 1+. Ataxia L>R UE without change   Assessment/Plan: 1. Functional deficits which require 3+ hours per day of interdisciplinary therapy in a comprehensive inpatient rehab setting. Physiatrist is providing close team supervision and 24 hour management of active medical problems listed below. Physiatrist and rehab team continue to assess barriers to discharge/monitor patient progress toward functional and medical goals  Care Tool:  Bathing    Body parts bathed by patient: Chest, Abdomen    Body parts bathed by helper: Right arm, Left arm, Buttocks, Right upper leg, Left upper leg, Right lower leg, Left lower leg     Bathing assist Assist Level: Maximal Assistance - Patient 24 - 49%     Upper Body Dressing/Undressing Upper body dressing   What is the patient wearing?: Pull over shirt    Upper body assist Assist Level: Maximal Assistance - Patient 25 - 49%    Lower Body Dressing/Undressing Lower body dressing      What is the patient wearing?: Incontinence brief, Pants     Lower body assist Assist for lower body dressing: Total Assistance - Patient < 25%     Toileting Toileting    Toileting assist Assist for toileting: Total Assistance - Patient < 25%     Transfers Chair/bed transfer  Transfers assist  Chair/bed transfer activity did not occur: Safety/medical concerns  Chair/bed transfer assist level: 2 Helpers     Locomotion Ambulation   Ambulation assist   Ambulation activity did not occur: Safety/medical concerns  Assist level: 2 helpers (+3 with WC follow) Assistive device: Hand held assist Max distance: 15'   Walk 10 feet activity   Assist     Assist level: 2 helpers (+  3 WC follow) Assistive device: Hand held assist   Walk 50 feet activity   Assist Walk 50 feet with 2 turns activity did not occur: Safety/medical concerns         Walk 150 feet activity   Assist Walk 150 feet activity did not occur: Safety/medical concerns         Walk 10 feet on uneven surface  activity   Assist Walk 10 feet on uneven surfaces activity did not occur: Safety/medical concerns         Wheelchair     Assist Is the patient using a wheelchair?: No             Wheelchair 50 feet with 2 turns activity    Assist            Wheelchair 150 feet activity     Assist          Blood pressure 124/81, pulse 82, temperature 98.1 F (36.7 C), resp. rate 20, height 5\' 2"  (1.575 m), weight 98 kg, SpO2 99  %.  Medical Problem List and Plan: 1. 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  Antithrombotics: -DVT/anticoagulation:  Pharmaceutical: Lovenox             -antiplatelet therapy: Aspirin 81 mg daily and Brilinta 90 mg twice daily 3. Pain Management: Tylenol as needed 4. Mood: Zoloft 100 mg daily, melatonin 3 mg nightly             -antipsychotic agents: Seroquel 50 mg nightly for agitation  -mood better controlled 5. Neuropsych: This patient is not fully capable of making decisions on his own behalf. 6. Skin/Wound Care: Routine skin checks 7. Fluids/Electrolytes/Nutrition: encourage PO  -hyponatremia 136 11/21 8.  Hyperlipidemia.  Lipitor 9.  History of alcohol and marijuana use.  Urine drug screen positive marijuana.  Counseling 10. Morbid Obesity- BMI 39.52- counseling and diet education 11. Elevated LFT's  11/22 -further increase in AST/ALT today  11/23 lipitor held--recheck LFT's Friday 12. Mild leukocytosis: trending down  Afebrile  -no s/s infection  -improved   11/22 LOS: 6 days A FACE TO FACE EVALUATION WAS PERFORMED  12/22 08/11/2021, 8:17 AM

## 2021-08-11 NOTE — Progress Notes (Signed)
Occupational Therapy Session Note  Patient Details  Name: Roy [name] MRN: [id number] Date of Birth: 02/01/1996  Today's Date: 08/11/2021 OT Individual Time: 1300-1409 OT Individual Time Calculation (min): 69 min    Short Term Goals: Week 1:  OT Short Term Goal 1 (Week 1): Patient will perform sit<>stand with max A of 1 and LRAD in preparation for BADL tasks OT Short Term Goal 2 (Week 1): Pt will bring eating etensil to mouth with min A OT Short Term Goal 3 (Week 1): Patient will maintain grasp on toothbrush using adaptive equipment and brush for 30 seconds OT Short Term Goal 4 (Week 1): Patient will complete 1 step of UB dressing task.  Skilled Therapeutic Interventions/Progress Updates:  Pt greeted  seated in TIS   agreeable to OT intervention. Session focus on various therapeutic activities focused on dynamic sitting/standing balance, functional sit<>stands, RUE/LUE coordination and increasing overall activity tolerance to facilitate improvement in ADL participation. Pt transported to gym with total A from TIS. Stand step transfer from TIS>EOM with MOD A+2 for safety. Worked on dynamic sitting balance and BUE coordination. Pt instructed to reach out of BOS to place matching cards on mirror, pt needing MIN A for FM skills on R side and needed board lowered to 90* sh flexion d/t reports of pain in RUE. Pt able to remove cards from board with LUE with 50% accuracy as pt presents with depth perception issues as well as dyskinesia as pt noted to think he had removed card when pt actually presenting with uncoordinated reach towards board and unable to grasp card even with gross grasp. Worked on reaching for cones with BUEs and stacking on top of each other, better success with RUE but able to complete with LUE with MOD A. Pt completed multiple sit<>stands with MOD A +2 (LLE blocked), pt even able to reach out of BOS with BUEs to tap stimulus to promote improved dynamic balance and BUE  coordination.pt able to lift RLE off of group x2 sets of 10 to promote NMR on LLE. Pt loves music and enjoyed dancing from EOM to promote dynamic sitting balance and incorporating BUEs into synchronized task to promote improved bilateral integration to facilitate improved independence with ADL participation. Bilateral tasks included clapping, arm rolls and rotation through trunk. Pt also accurately stating song lyrics without cues. Pt did have drastic personality change at end of session gesturing to stomach stating "help me." Assisted pt back to TIS with MOD A +2 for safety via stand step transfer. Alerted RN who thinks pt needs to have BM, plan to issue suppository after all therapies, pt transported back to room with total A where pt left semi reclined in TIS with call bell in reach.                  Therapy Documentation Precautions:  Precautions Precautions: Fall Precaution Comments: R hemi, diplopia/visual deficits Restrictions Weight Bearing Restrictions: No       Therapy/Group: Individual Therapy  Barron Schmid 08/11/2021, 2:16 PM

## 2021-08-12 LAB — GLUCOSE, CAPILLARY: Glucose-Capillary: 140 mg/dL — ABNORMAL HIGH (ref 70–99)

## 2021-08-12 LAB — CREATININE, SERUM
Creatinine, Ser: 0.83 mg/dL (ref 0.61–1.24)
GFR, Estimated: >60 mL/min (ref >60)

## 2021-08-12 NOTE — Progress Notes (Signed)
PROGRESS NOTE   Subjective/Complaints:  No issues overnite, wishes he had therapy today  Still dysarthric but can say "happy Thanksgiving"  ROS: Limited due to cognitive/behavioral   Objective:   No results found. No results for input(s): WBC, HGB, HCT, PLT in the last 72 hours.  Recent Labs    08/12/21 0523  CREATININE 0.83     Intake/Output Summary (Last 24 hours) at 08/12/2021 1017 Last data filed at 08/11/2021 1700 Gross per 24 hour  Intake 480 ml  Output --  Net 480 ml         Physical Exam: Vital Signs Blood pressure 111/69, pulse 83, temperature 98.3 F (36.8 C), temperature source Oral, resp. rate 20, height 5\' 2"  (1.575 m), weight 98 kg, SpO2 100 %.   General: No acute distress Mood and affect are appropriate Heart: Regular rate and rhythm no rubs murmurs or extra sounds Lungs: Clear to auscultation, breathing unlabored, no rales or wheezes Abdomen: Positive bowel sounds, soft nontender to palpation, nondistended Extremities: No clubbing, cyanosis, or edema Skin: No evidence of breakdown, no evidence of rash   Skin: clean, dry Musc: No edema in extremities.  No tenderness in extremities. Neuro: Alert, partial INO Left pupil --no change Severe dysarthria improving. Pt repeats a lot, However, which makes speech still difficult to comprehend Motor: RUE 5/5, RLE 4-5/5 LUE and LLE 3+ to 4/5 with 1/2 sensation present. DTR's 1+. Ataxia L>R UE without change Sensation reduced on Left side    Assessment/Plan: 1. Functional deficits which require 3+ hours per day of interdisciplinary therapy in a comprehensive inpatient rehab setting. Physiatrist is providing close team supervision and 24 hour management of active medical problems listed below. Physiatrist and rehab team continue to assess barriers to discharge/monitor patient progress toward functional and medical goals  Care Tool:  Bathing     Body parts bathed by patient: Chest, Abdomen   Body parts bathed by helper: Right arm, Left arm, Buttocks, Right upper leg, Left upper leg, Right lower leg, Left lower leg     Bathing assist Assist Level: Maximal Assistance - Patient 24 - 49%     Upper Body Dressing/Undressing Upper body dressing   What is the patient wearing?: Pull over shirt    Upper body assist Assist Level: Maximal Assistance - Patient 25 - 49%    Lower Body Dressing/Undressing Lower body dressing      What is the patient wearing?: Incontinence brief, Pants     Lower body assist Assist for lower body dressing: Total Assistance - Patient < 25%     Toileting Toileting    Toileting assist Assist for toileting: Total Assistance - Patient < 25%     Transfers Chair/bed transfer  Transfers assist  Chair/bed transfer activity did not occur: Safety/medical concerns  Chair/bed transfer assist level: 2 Helpers     Locomotion Ambulation   Ambulation assist   Ambulation activity did not occur: Safety/medical concerns  Assist level: 2 helpers (+3 with WC follow) Assistive device: Hand held assist Max distance: 15'   Walk 10 feet activity   Assist     Assist level: 2 helpers (+3 WC follow) Assistive device: Hand held assist  Walk 50 feet activity   Assist Walk 50 feet with 2 turns activity did not occur: Safety/medical concerns         Walk 150 feet activity   Assist Walk 150 feet activity did not occur: Safety/medical concerns         Walk 10 feet on uneven surface  activity   Assist Walk 10 feet on uneven surfaces activity did not occur: Safety/medical concerns         Wheelchair     Assist Is the patient using a wheelchair?: No             Wheelchair 50 feet with 2 turns activity    Assist            Wheelchair 150 feet activity     Assist          Blood pressure 111/69, pulse 83, temperature 98.3 F (36.8 C), temperature source  Oral, resp. rate 20, height 5\' 2"  (1.575 m), weight 98 kg, SpO2 100 %.  Medical Problem List and Plan: 1. 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  Antithrombotics: -DVT/anticoagulation:  Pharmaceutical: Lovenox             -antiplatelet therapy: Aspirin 81 mg daily and Brilinta 90 mg twice daily 3. Pain Management: Tylenol as needed 4. Mood: Zoloft 100 mg daily, melatonin 3 mg nightly             -antipsychotic agents: Seroquel 50 mg nightly for agitation  -mood better controlled 5. Neuropsych: This patient is not fully capable of making decisions on his own behalf. 6. Skin/Wound Care: Routine skin checks 7. Fluids/Electrolytes/Nutrition: encourage PO  -hyponatremia 136 11/21 8.  Hyperlipidemia.  Lipitor 9.  History of alcohol and marijuana use.  Urine drug screen positive marijuana.  Counseling 10. Morbid Obesity- BMI 39.52- counseling and diet education 11. Elevated LFT's  11/22 -further increase in AST/ALT today  11/23 lipitor held--recheck LFT's Friday 12. Mild leukocytosis: trending down  Afebrile  -no s/s infection  -improved   11/22 LOS: 7 days A FACE TO FACE EVALUATION WAS PERFORMED  12/22 08/12/2021, 10:17 AM

## 2021-08-13 LAB — HEPATIC FUNCTION PANEL
ALT: 185 U/L — ABNORMAL HIGH (ref 0–44)
AST: 84 U/L — ABNORMAL HIGH (ref 15–41)
Albumin: 3.4 g/dL — ABNORMAL LOW (ref 3.5–5.0)
Alkaline Phosphatase: 77 U/L (ref 38–126)
Bilirubin, Direct: <0.1 mg/dL (ref 0.0–0.2)
Total Bilirubin: 0.6 mg/dL (ref 0.3–1.2)
Total Protein: 6.7 g/dL (ref 6.5–8.1)

## 2021-08-13 NOTE — Progress Notes (Signed)
PROGRESS NOTE   Subjective/Complaints: Seen in PT, very severe trunkal ataxia and weakness, discussed supported amb using lite gait with therapist   ROS: Limited due to cognitive/behavioral   Objective:   No results found. No results for input(s): WBC, HGB, HCT, PLT in the last 72 hours.  Recent Labs    08/12/21 0523  CREATININE 0.83     Intake/Output Summary (Last 24 hours) at 08/13/2021 0844 Last data filed at 08/12/2021 1250 Gross per 24 hour  Intake 120 ml  Output --  Net 120 ml         Physical Exam: Vital Signs Blood pressure 112/75, pulse 82, temperature 98.1 F (36.7 C), resp. rate 18, height 5\' 2"  (1.575 m), weight 98 kg, SpO2 97 %.  General: No acute distress Mood and affect are appropriate Heart: Regular rate and rhythm no rubs murmurs or extra sounds Lungs: Clear to auscultation, breathing unlabored, no rales or wheezes Abdomen: Positive bowel sounds, soft nontender to palpation, nondistended Extremities: No clubbing, cyanosis, or edema Skin: No evidence of breakdown, no evidence of rash Skin: clean, dry Musc: No edema in extremities.  No tenderness in extremities. Neuro: Alert, partial INO Left pupil --no change Severe dysarthria improving. Pt repeats a lot, However, which makes speech still difficult to comprehend Motor: RUE 5/5, RLE 4-5/5 LUE and LLE 3+ to 4/5 with 1/2 sensation present. DTR's 1+. Ataxia L>R UE without change Sensation reduced on Left side    Assessment/Plan: 1. Functional deficits which require 3+ hours per day of interdisciplinary therapy in a comprehensive inpatient rehab setting. Physiatrist is providing close team supervision and 24 hour management of active medical problems listed below. Physiatrist and rehab team continue to assess barriers to discharge/monitor patient progress toward functional and medical goals  Care Tool:  Bathing    Body parts bathed by  patient: Chest, Abdomen   Body parts bathed by helper: Right arm, Left arm, Buttocks, Right upper leg, Left upper leg, Right lower leg, Left lower leg     Bathing assist Assist Level: Maximal Assistance - Patient 24 - 49%     Upper Body Dressing/Undressing Upper body dressing   What is the patient wearing?: Pull over shirt    Upper body assist Assist Level: Maximal Assistance - Patient 25 - 49%    Lower Body Dressing/Undressing Lower body dressing      What is the patient wearing?: Incontinence brief, Pants     Lower body assist Assist for lower body dressing: Total Assistance - Patient < 25%     Toileting Toileting    Toileting assist Assist for toileting: Total Assistance - Patient < 25%     Transfers Chair/bed transfer  Transfers assist  Chair/bed transfer activity did not occur: Safety/medical concerns  Chair/bed transfer assist level: 2 Helpers     Locomotion Ambulation   Ambulation assist   Ambulation activity did not occur: Safety/medical concerns  Assist level: 2 helpers (+3 with WC follow) Assistive device: Hand held assist Max distance: 15'   Walk 10 feet activity   Assist     Assist level: 2 helpers (+3 WC follow) Assistive device: Hand held assist   Walk 50 feet  activity   Assist Walk 50 feet with 2 turns activity did not occur: Safety/medical concerns         Walk 150 feet activity   Assist Walk 150 feet activity did not occur: Safety/medical concerns         Walk 10 feet on uneven surface  activity   Assist Walk 10 feet on uneven surfaces activity did not occur: Safety/medical concerns         Wheelchair     Assist Is the patient using a wheelchair?: No             Wheelchair 50 feet with 2 turns activity    Assist            Wheelchair 150 feet activity     Assist          Blood pressure 112/75, pulse 82, temperature 98.1 F (36.7 C), resp. rate 18, height 5\' 2"  (1.575 m), weight  98 kg, SpO2 97 %.  Medical Problem List and Plan: 1. 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  Antithrombotics: -DVT/anticoagulation:  Pharmaceutical: Lovenox             -antiplatelet therapy: Aspirin 81 mg daily and Brilinta 90 mg twice daily 3. Pain Management: Tylenol as needed 4. Mood: Zoloft 100 mg daily, melatonin 3 mg nightly             -antipsychotic agents: Seroquel 50 mg nightly for agitation  -mood better controlled 5. Neuropsych: This patient is not fully capable of making decisions on his own behalf. 6. Skin/Wound Care: Routine skin checks 7. Fluids/Electrolytes/Nutrition: encourage PO  -hyponatremia 136 11/21 8.  Hyperlipidemia.  Lipitor 9.  History of alcohol and marijuana use.  Urine drug screen positive marijuana.  Counseling 10. Morbid Obesity- BMI 39.52- counseling and diet education 11. Elevated LFT's  11/22 -further increase in AST/ALT today  11/23 lipitor held--recheck LFT's Friday 12. Mild leukocytosis: trending down  Afebrile  -no s/s infection  -improved   11/22 LOS: 8 days A FACE TO FACE EVALUATION WAS PERFORMED  12/22 08/13/2021, 8:44 AM

## 2021-08-13 NOTE — Progress Notes (Signed)
Physical Therapy Weekly Progress Note  Patient Details  Name: Roy [name] MRN: [id number] Date of Birth: 1995/10/13  Beginning of progress report period: August 06, 2021 End of progress report period: August 13, 2021  Today's Date: 08/13/2021 PT Individual Time: 0805-0900 PT Individual Time Calculation (min): 55 min   Patient has met 0 of 4 short term goals.  Pt is progressing toward mobility goals, but varies in level of assistance required for functional tasks secondary to motor control and coordination deficits. At times pt has been able to perform sit to stand with minA but occasionally requires modA to [place]. Bed to chair transfers and ambulation, similarly, can require as little as modA or mod +2 at times, but may also require heavy maxA +2. Pt will benefit from skilled family education prior to DC.  Patient continues to demonstrate the following deficits muscle weakness, decreased cardiorespiratoy endurance, motor apraxia, ataxia, decreased coordination, and decreased motor planning, field cut, and decreased sitting balance, decreased standing balance, decreased postural control, and decreased balance strategies and therefore will continue to benefit from skilled PT intervention to increase functional independence with mobility.  Patient progressing toward long term goals..  Continue plan of care.  PT Short Term Goals Week 1:  PT Short Term Goal 1 (Week 1): Pt will perform bed mobility with modA PT Short Term Goal 1 - Progress (Week 1): Progressing toward goal PT Short Term Goal 2 (Week 1): Pt will perform sit to stand transfer with minA. PT Short Term Goal 2 - Progress (Week 1): Progressing toward goal PT Short Term Goal 3 (Week 1): Pt will perform bed to chair transfer with modA. PT Short Term Goal 3 - Progress (Week 1): Progressing toward goal PT Short Term Goal 4 (Week 1): Pt will ambulate x25' with modA +2. PT Short Term Goal 4 - Progress (Week 1): Progressing toward  goal Week 2:  PT Short Term Goal 1 (Week 2): Pt will perform bed mobility consistently with modA PT Short Term Goal 2 (Week 2): Pt will perform sit to stand transfer consistently with minA. PT Short Term Goal 3 (Week 2): Pt will perform bed to chair transfer consistently with modA. PT Short Term Goal 4 (Week 2): Pt will ambulate x25' with modA +2.  Skilled Therapeutic Interventions/Progress Updates:  Ambulation/gait training;Community reintegration;DME/adaptive equipment instruction;Neuromuscular re-education;Psychosocial support;UE/LE Strength taining/ROM;Wheelchair propulsion/positioning;Stair training;Balance/vestibular training;Functional electrical stimulation;Pain management;Skin care/wound management;UE/LE Coordination activities;Therapeutic Activities;Cognitive remediation/compensation;Disease management/prevention;Patient/family education;Therapeutic Exercise;Visual/perceptual remediation/compensation;Splinting/orthotics;Functional mobility training;Discharge planning   Pt received supine in bed and agrees to therapy. No complaint of pain. Pt performs logrolling to the R with minA and cues for body mechanics and sequencing, and modA for sidelying to sitting. Pt performs sit to stand with heavy modA and stand step transfer to [facility] with maxA and manual facilitation of weight shifting, trunk control, and positioning. WC transport to gym for time management. Pt performs stand pivot transfer to mat with modA +2. Seated at edge of mat facing mirror for visual feedback, pt performs multiple reps of sit to stand with modA. Pt attempts to maintain static standing balance, requiring minA to modA +2, tending to lean forward but also at time losing balance backward. Pt maintains static standing for max of 5 seconds with CGA prior to LOB backward.   Pt performs NMR for trunk control while seated at edge of mat. PT positioned behind pt on exercise ball and rehab tech in front of pt. Pt tasked with performing  anterior trunk lean until elbows are resting on  distal thighs, then extending trunk back to semifowlers position with back supported on exercise ball. Pt performs x15 reps slowly and without requiring physical assistance, gradually increasing range with each rep.   Sit to stand and stand step transfer back to [facility] with modA +2 and cues for posture and positioning. Left seated in WC with alarm intact and all needs within reach.  Therapy Documentation Precautions:  Precautions Precautions: Fall Precaution Comments: R hemi, diplopia/visual deficits Restrictions Weight Bearing Restrictions: No  Therapy/Group: Individual Therapy  Breck Coons 08/13/2021, 10:52 AM

## 2021-08-13 NOTE — Progress Notes (Signed)
Physical Therapy Session Note  Patient Details  Name: Roy [name] MRN: [id number] Date of Birth: Mar 08, 1996  Today's Date: 08/13/2021 PT Individual Time: 1400-1430 PT Individual Time Calculation (min): 30 min   Short Term Goals: Week 1:  PT Short Term Goal 1 (Week 1): Pt will perform bed mobility with modA PT Short Term Goal 1 - Progress (Week 1): Progressing toward goal PT Short Term Goal 2 (Week 1): Pt will perform sit to stand transfer with minA. PT Short Term Goal 2 - Progress (Week 1): Progressing toward goal PT Short Term Goal 3 (Week 1): Pt will perform bed to chair transfer with modA. PT Short Term Goal 3 - Progress (Week 1): Progressing toward goal PT Short Term Goal 4 (Week 1): Pt will ambulate x25' with modA +2. PT Short Term Goal 4 - Progress (Week 1): Progressing toward goal Week 2:  PT Short Term Goal 1 (Week 2): Pt will perform bed mobility consistently with modA PT Short Term Goal 2 (Week 2): Pt will perform sit to stand transfer consistently with minA. PT Short Term Goal 3 (Week 2): Pt will perform bed to chair transfer consistently with modA. PT Short Term Goal 4 (Week 2): Pt will ambulate x25' with modA +2. Week 3:     Skilled Therapeutic Interventions/Progress Updates:  Pt initially supine.  Supine to sit w/max cues, max assist.  Static sit w/min assist, scoots in sitting w/cues, mod assist.   Sit to stand w/mod assist d/t instability w/task.  Attempted sidestep to R but pt unable to control R fool placment/steps forward w/mild adduction, therapist assisted w/stepping back, return to sitting.   Worked on static standing/upright posture x 1 min Progressed to stand pivot transfer w/mod to max assist d/t ataxia, poor balance. Pt transported to hall.   Sit to stand w/cues for sequencing/hand placement.   Gait 33ft w/+2 max HHA, very ataxic gait, limited by gradually increasing L flexor synergy eventually progressing to LLE flexor withdrawal. Pt left oob in wc  w/alarm belt set and needs in reach  Therapy Documentation Precautions:  Precautions Precautions: Fall Precaution Comments: R hemi, diplopia/visual deficits Restrictions Weight Bearing Restrictions: No     Therapy/Group: Individual Therapy Rada Hay, PT   Shearon Balo 08/13/2021, 3:49 PM

## 2021-08-13 NOTE — Progress Notes (Signed)
Physical Therapy Session Note  Patient Details  Name: Roy [name] MRN: [id number] Date of Birth: 1995-10-15  Today's Date: 08/13/2021 PT Individual Time:  -      Short Term Goals: Week 1:  PT Short Term Goal 1 (Week 1): Pt will perform bed mobility with modA PT Short Term Goal 2 (Week 1): Pt will perform sit to stand transfer with minA. PT Short Term Goal 3 (Week 1): Pt will perform bed to chair transfer with modA. PT Short Term Goal 4 (Week 1): Pt will ambulate x25' with modA +2.  Skilled Therapeutic Interventions/Progress Updates: Pt presents reclined in TIS and reluctantly agreeable to therapy.  Pt fatigued from previous therapies.  Pt wheeled to Dayroom for time conservation.  Pt transferred sit to stand w/ max A and then transitioned to HHA x 2 w/ max A still to maintain balance.  Pt amb x 3' to Nu-step w/ max A for weight shift for advancing LEs w/ occasional scissoring.  Pt performed Nu-step at level 2 using B LEs and UEs w constant cueing for attention to hand grasp, R > L.  Pt performed also w/ LE only.  Pt performed x 6' w/ 1' rest break, followed by 4'.  RLE required manual assist to maintain.  Pt transferred sit to stand w/ max A and then step-pivot to w/c w/ facilitation for weight shift as well as verbal cueing for sequencing, SBA of 2nd person.  Pt w/ increased sway and decreased placement of Les.  Pt remained sitting up in TIS, reclined back w/ chair alarm on and all needs in reach, S.O in room.     Therapy Documentation Precautions:  Precautions Precautions: Fall Precaution Comments: R hemi, diplopia/visual deficits Restrictions Weight Bearing Restrictions: No General:   Vital Signs:   Pain:0/10 Pain Assessment Pain Scale: 0-10 Pain Score: 0-No pain     Therapy/Group: Individual Therapy  Lucio Edward 08/13/2021, 9:57 AM

## 2021-08-13 NOTE — Progress Notes (Signed)
Occupational Therapy Weekly Progress Note  Patient Details  Name: Roy [name] MRN: [id number] Date of Birth: 08/17/1996  Beginning of progress report period: August 06, 2021 End of progress report period: August 13, 2021  Today's Date: 08/13/2021 OT Individual Time: 1102-1200 OT Individual Time Calculation (min): 58 min    Patient has met 2 of 4 short term goals.  Patient is making slow, but steady progress towards OT goals. Pt has made progress in sit<>stands, activity tolerance, and transfers this week. R UE is still weak with L UE ataxia and proprioceptive deficits impacting BADL performance. Continue current plan of care.   Patient continues to demonstrate the following deficits: muscle weakness, impaired timing and sequencing, abnormal tone, unbalanced muscle activation, motor apraxia, ataxia, decreased coordination, and decreased motor planning, decreased visual perceptual skills and decreased visual motor skills, decreased midline orientation and decreased motor planning, decreased initiation, decreased attention, decreased awareness, decreased problem solving, decreased safety awareness, decreased memory, and delayed processing, and decreased sitting balance, decreased standing balance, decreased postural control, hemiplegia, and decreased balance strategies and therefore will continue to benefit from skilled OT intervention to enhance overall performance with BADL and Reduce care partner burden.  Patient progressing toward long term goals..  Continue plan of care.  OT Short Term Goals Week 1:  OT Short Term Goal 1 (Week 1): Patient will perform sit<>stand with max A of 1 and LRAD in preparation for BADL tasks OT Short Term Goal 1 - Progress (Week 1): Met OT Short Term Goal 2 (Week 1): Pt will bring eating etensil to mouth with min A OT Short Term Goal 2 - Progress (Week 1): Progressing toward goal OT Short Term Goal 3 (Week 1): Patient will maintain grasp on toothbrush using  adaptive equipment and brush for 30 seconds OT Short Term Goal 3 - Progress (Week 1): Met OT Short Term Goal 4 (Week 1): Patient will complete 1 step of UB dressing task. OT Short Term Goal 4 - Progress (Week 1): Progressing toward goal Week 2:  OT Short Term Goal 1 (Week 2): Pt will bring eating etensil to mouth with mod A OT Short Term Goal 2 (Week 2): Patient will complete 1 step of UB dressing task. OT Short Term Goal 3 (Week 2): Patient will complete toilet transfer with max A of 1 and LRAD  Skilled Therapeutic Interventions/Progress Updates:    Patient greeted semi-reclined in bed and agreeable to OT treatment session focused on self-care retraining. Addressed bed mobility with patient able to initiate reaching and scooting, but still needed mod/max A to get there. Sit<>stand in [place] with mod A and max A to get hands to the bars. Pt over reaching with L hand and unable to get R hand up to the bar. Worked on core strength and functional use of  B UE's with UB bathing task perched on [place]. Pt needed min to intermittent mod A to maintain upright posture on [place]. Hand over hand at times to integrate R UE and maintain grasp on wash cloth with L hand. Attempted for pt to wash peri-area in standing, but with proprioceptive, ataxia, and sensation deficits, pt unable to maintain wash cloth grip and wash thoroughly requiring OT assist. UB dressing seated in wc with focus on forced use of B UE's for neuro re-ed throughout task. Overall, still needed total A for dressing tasks. Pt left seated in TIS wc at end of session with alarm belt on, call bell in reach, and needs met.   Therapy  Documentation Precautions:  Precautions Precautions: Fall Precaution Comments: R hemi, diplopia/visual deficits Restrictions Weight Bearing Restrictions: No Pain: Pain Assessment Pain Scale: 0-10 Pain Score: 0-No pain   Therapy/Group: Individual Therapy  Valma Cava 08/13/2021, 11:50 AM

## 2021-08-13 NOTE — Progress Notes (Signed)
Speech Language Pathology Weekly Progress and Session Note  Patient Details  Name: Roy [name] MRN: [id number] Date of Birth: 12/02/1995  Beginning of progress report period: August 06, 2021 End of progress report period: August 13, 2021  Today's Date: 08/13/2021 SLP Individual Time: 1435-1500 SLP Individual Time Calculation (min): 25 min  Short Term Goals: Week 1: SLP Short Term Goal 1 (Week 1): Patient will demonstrate sustained attention to functional tasks for ~10 minutes with Mod verbal cues for redirection. SLP Short Term Goal 1 - Progress (Week 1): Met SLP Short Term Goal 2 (Week 1): Patient will self-monitor and correct verbal perseveration at the word level with Max A verbal and visual cues. SLP Short Term Goal 2 - Progress (Week 1): Not met SLP Short Term Goal 3 (Week 1): Patient will utilize diaphragmatic breathing at the phrase level with Mod A multimodal cues. SLP Short Term Goal 3 - Progress (Week 1): Not met SLP Short Term Goal 4 (Week 1): Patient will utilize speech intelligibility strategies at the phrase level with Max A multimodal cues to achieve ~75% intelligibility. SLP Short Term Goal 4 - Progress (Week 1): Not met    New Short Term Goals: Week 2: SLP Short Term Goal 1 (Week 2): Patient will demonstrate sustained attention to functional tasks for ~25 minutes with Mod verbal cues for redirection. SLP Short Term Goal 2 (Week 2): Patient will self-monitor and correct verbal perseveration at the word level with Max A verbal and visual cues in 25% of opportunities. SLP Short Term Goal 3 (Week 2): Patient will utilize diaphragmatic breathing at the phrase level with Mod A multimodal cues. SLP Short Term Goal 4 (Week 2): Patient will utilize speech intelligibility strategies at the phrase level with Max A multimodal cues to achieve ~75% intelligibility.  Weekly Progress Updates: Patient has made slow gains and has met 1 of 4 STGs this reporting period. Currently,  patient has severe dysarthria and is ~50-75% intelligible at the phrase level with Max-Total A multimodal cues needed for use of diaphragmatic breathing, a slow rate, over-articulation and to self-monitor and correct verbal perseveration. Patient does demonstrate improved sustained attention and use of slow, diaphragmatic breathing at rest. Patient and family education ongoing. Patient would benefit from continued skilled SLP intervention to maximize his speech and cognitive functioning prior to discharge.     Intensity: Minumum of 1-2 x/day, 30 to 90 minutes Frequency: 3 to 5 out of 7 days Duration/Length of Stay: 09/13/21 Treatment/Interventions: Cognitive remediation/compensation;Internal/external aids;Speech/Language facilitation;Cueing hierarchy;Environmental controls;Therapeutic Activities;Functional tasks;Patient/family education   Daily Session  Skilled Therapeutic Interventions:  Skilled treatment session focused on communication goals. SLP facilitated session by providing Mod verbal cues for diaphragmatic breathing but Max verbal cues for slow exhalation. Max verbal cues were also needed for initiation vocalization at beginning of exhalation rather than the end. Max verbal cues were also needed for a slow rate of speech with use of over-articulation to improve intelligibility to ~75% at the phrase level. Patient with decreased management of secretions with intermittent anterior spillage of saliva. Patient left upright in the wheelchair with alarm on and all needs within reach. Continue with current plan of care.     Pain No/Denies Pain   Therapy/Group: Individual Therapy  ,  08/13/2021, 6:28 AM       

## 2021-08-14 NOTE — Progress Notes (Signed)
Speech Language Pathology Daily Session Note  Patient Details  Name: Roy [name] MRN: [id number] Date of Birth: 10/13/1995  Today's Date: 08/14/2021 SLP Individual Time: 1300-1400 SLP Individual Time Calculation (min): 60 min  Short Term Goals: Week 2: SLP Short Term Goal 1 (Week 2): Patient will demonstrate sustained attention to functional tasks for ~25 minutes with Mod verbal cues for redirection. SLP Short Term Goal 2 (Week 2): Patient will self-monitor and correct verbal perseveration at the word level with Max A verbal and visual cues in 25% of opportunities. SLP Short Term Goal 3 (Week 2): Patient will utilize diaphragmatic breathing at the phrase level with Mod A multimodal cues. SLP Short Term Goal 4 (Week 2): Patient will utilize speech intelligibility strategies at the phrase level with Max A multimodal cues to achieve ~75% intelligibility.  Skilled Therapeutic Interventions: Pt seen for skilled ST with focus on speech goals, upright in wheelchair and motivated for therapy. SLP facilitated session by providing moderate verbal cues for diaphragmatic breathing vs shallow upper airway breathing. Pt denies feeling tense during speech tasks, speech occasionally sounds strained. Pt benefits from max verbal cues for slow exhalation as well as max cues to break stuttering speech episodes (repeating phonemes or partial words >6 times) with deep breaths. When pt speaking in error free conversational tasks, intelligibility ~50%. When patient cued to reduce utterance to single word or ~3 word phrase, intelligibility increased to 75%. Pt did demonstrate independence with identifying and repairing communication breakdowns including utilizing gestures or spelling difficult words. Pt becoming emotional at end of tx session regarding his mother and current medical situation. SLP providing appropriate emotional support and education. Pt left in wheelchair with soft call button pinned to shirt. Cont ST  POC.   Pain Pain Assessment Pain Scale: 0-10 Pain Score: 0-No pain  Therapy/Group: Individual Therapy  Tacey Ruiz 08/14/2021, 1:52 PM

## 2021-08-14 NOTE — Progress Notes (Signed)
Occupational Therapy Session Note  Patient Details  Name: Roy [name] MRN: [id number] Date of Birth: 11-12-1995  Today's Date: 08/14/2021 OT Individual Time: 0905-1005 OT Individual Time Calculation (min): 60 min    Short Term Goals: Week 2:  OT Short Term Goal 1 (Week 2): Pt will bring eating etensil to mouth with mod A OT Short Term Goal 2 (Week 2): Patient will complete 1 step of UB dressing task. OT Short Term Goal 3 (Week 2): Patient will complete toilet transfer with max A of 1 and LRAD  Skilled Therapeutic Interventions/Progress Updates:    Pt in bed to start session with his significant other present.  She was arguing with him about giving more effort in therapy and to stop saying "I can't".  He was able to transfer to the EOB with mod assist and mod demonstrational cueing for activation of core.  Max assist for stand pivot transfer to the tilt in space wheelchair for work on B/D tasks at the sink.  Max assist for removal of UB clothing.  He was then able to wash his face with min assist sitting EOC using the LUE.  Mod facilitation needed to integrate BUEs for applying soap to the washcloth and squeezing it out secondary to decreased functional use.  He reports not being able to feel in the LUE with hemiparesis noted in the RUE.  He was then able to complete washing the RUE, chest, and abdomen using the LUE with min assist while needing max assist for integration of the RUE for washing the LUE.  Total assist for washing front and back peri area in standing.  Pt attempted but was unsuccessful with holding the washcloth in the left hand without dropping it once standing.  Worked on donning his pullover shirt with max assist and then donned his brief and pants at max assist level sit to stand.  Pt finished session sitting in the tilt in space wheelchair with his significant other present.  Safety alarm in place with soft touch call button in his lap.    Therapy Documentation Precautions:   Precautions Precautions: Fall Precaution Comments: R hemi, diplopia/visual deficits Restrictions Weight Bearing Restrictions: No  Pain: Pain Assessment Pain Scale: 0-10 Pain Score: 0-No pain ADL: See Care Tool Section for some details of mobility and selfcare  Therapy/Group: Individual Therapy  Tasnim Balentine OTR/L 08/14/2021, 12:28 PM

## 2021-08-14 NOTE — Progress Notes (Signed)
PROGRESS NOTE   Subjective/Complaints: Shakes head no to pain Pleasant and interactive Participated well with therapy today Walked with Lite Gait  ROS: Limited due to cognitive/behavioral   Objective:   No results found. No results for input(s): WBC, HGB, HCT, PLT in the last 72 hours.  Recent Labs    08/12/21 0523  CREATININE 0.83    Intake/Output Summary (Last 24 hours) at 08/14/2021 1530 Last data filed at 08/14/2021 1303 Gross per 24 hour  Intake 480 ml  Output 300 ml  Net 180 ml        Physical Exam: Vital Signs Blood pressure 114/84, pulse 97, temperature 98.7 F (37.1 C), resp. rate 18, height 5\' 2"  (1.575 m), weight 98 kg, SpO2 100 %. Gen: no distress, normal appearing HEENT: oral mucosa pink and moist, NCAT Cardio: Reg rate Chest: normal effort, normal rate of breathing Abd: soft, non-distended Ext: no edema Psych: pleasant, normal affect Skin: intact Musc: No edema in extremities.  No tenderness in extremities. Neuro: Alert, partial INO Left pupil --no change Severe dysarthria improving. Pt repeats a lot, However, which makes speech still difficult to comprehend Motor: RUE 5/5, RLE 4-5/5 LUE and LLE 3+ to 4/5 with 1/2 sensation present. DTR's 1+. Ataxia L>R UE without change Sensation reduced on Left side    Assessment/Plan: 1. Functional deficits which require 3+ hours per day of interdisciplinary therapy in a comprehensive inpatient rehab setting. Physiatrist is providing close team supervision and 24 hour management of active medical problems listed below. Physiatrist and rehab team continue to assess barriers to discharge/monitor patient progress toward functional and medical goals  Care Tool:  Bathing    Body parts bathed by patient: Chest, Abdomen, Right arm, Right upper leg, Left upper leg, Face   Body parts bathed by helper: Front perineal area, Buttocks, Left lower leg, Right  lower leg, Left arm     Bathing assist Assist Level: Maximal Assistance - Patient 24 - 49%     Upper Body Dressing/Undressing Upper body dressing   What is the patient wearing?: Pull over shirt    Upper body assist Assist Level: Maximal Assistance - Patient 25 - 49%    Lower Body Dressing/Undressing Lower body dressing      What is the patient wearing?: Incontinence brief, Pants     Lower body assist Assist for lower body dressing: Total Assistance - Patient < 25%     Toileting Toileting    Toileting assist Assist for toileting: Total Assistance - Patient < 25%     Transfers Chair/bed transfer  Transfers assist  Chair/bed transfer activity did not occur: Safety/medical concerns  Chair/bed transfer assist level: Maximal Assistance - Patient 25 - 49% (stand pivot)     Locomotion Ambulation   Ambulation assist   Ambulation activity did not occur: Safety/medical concerns  Assist level: 2 helpers Assistive device: Hand held assist Max distance: 5   Walk 10 feet activity   Assist     Assist level: 2 helpers (+3 WC follow) Assistive device: Hand held assist   Walk 50 feet activity   Assist Walk 50 feet with 2 turns activity did not occur: Safety/medical concerns  Walk 150 feet activity   Assist Walk 150 feet activity did not occur: Safety/medical concerns         Walk 10 feet on uneven surface  activity   Assist Walk 10 feet on uneven surfaces activity did not occur: Safety/medical concerns         Wheelchair     Assist Is the patient using a wheelchair?: No             Wheelchair 50 feet with 2 turns activity    Assist            Wheelchair 150 feet activity     Assist          Blood pressure 114/84, pulse 97, temperature 98.7 F (37.1 C), resp. rate 18, height 5\' 2"  (1.575 m), weight 98 kg, SpO2 100 %.  Medical Problem List and Plan: 1. 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  Impaired mobility: -DVT/anticoagulation:  Pharmaceutical: Continue Lovenox. Monitoring creatinine on Thursdays             -antiplatelet therapy: Aspirin 81 mg daily and Brilinta 90 mg twice daily 3. Pain Management: Tylenol as needed 4. Depression/insomnia/agitation: Continue Zoloft 100 mg daily, melatonin 3 mg nightly             -antipsychotic agents: Seroquel 50 mg nightly for agitation  -mood better controlled 5. Neuropsych: This patient is not fully capable of making decisions on his own behalf. 6. Skin/Wound Care: Routine skin checks 7. Fluids/Electrolytes/Nutrition: encourage PO  -hyponatremia 136 11/21 8.  Hyperlipidemia.  Continue Lipitor 9.  History of alcohol and marijuana use.  Urine drug screen positive marijuana.  Counseling 10. Morbid Obesity- BMI 39.52- counseling and diet education 11. Elevated LFT's  11/22 -further increase in AST/ALT today  11/23 lipitor held--recheck LFT's Friday 12. Mild leukocytosis: trending down  Afebrile  -no s/s infection  -improved   11/22 LOS: 9 days A FACE TO FACE EVALUATION WAS PERFORMED  Roy [name] 08/14/2021, 3:30 PM

## 2021-08-14 NOTE — Progress Notes (Signed)
Physical Therapy Session Note  Patient Details  Name: Roy [name] MRN: [id number] Date of Birth: May 20, 1996  Today's Date: 08/14/2021 PT Individual Time: 1110-1205 PT Individual Time Calculation (min): 55 min   Short Term Goals: Week 1:  PT Short Term Goal 1 (Week 1): Pt will perform bed mobility with modA PT Short Term Goal 1 - Progress (Week 1): Progressing toward goal PT Short Term Goal 2 (Week 1): Pt will perform sit to stand transfer with minA. PT Short Term Goal 2 - Progress (Week 1): Progressing toward goal PT Short Term Goal 3 (Week 1): Pt will perform bed to chair transfer with modA. PT Short Term Goal 3 - Progress (Week 1): Progressing toward goal PT Short Term Goal 4 (Week 1): Pt will ambulate x25' with modA +2. PT Short Term Goal 4 - Progress (Week 1): Progressing toward goal  Skilled Therapeutic Interventions/Progress Updates:     Patient in TIS w/c in the room upon PT arrival. Patient alert and agreeable to PT session. Patient denied pain during session. Patient requested to focus part of the session on his R arm recovery, focused remainder of session on gait training in the Lite Gait for improved trunk control with body weight support.   Therapeutic Activity: Transfers: Patient performed sit to/from stand x4 with min A with and without upper extremity support. Provided verbal cues for scooting forward, forward weight shift, and gluteal activation for hip/trunk extension in standing. Patient maintained standing balance to donn/doff Lite Gait harness with total A of 1-2.   Gait Training:  Patient ambulated 60 feet and 93 feet using Lite Gait with ~30% body weight support. Ambulated with increased R adductor tone causing scissoring or narrow BOS, R trunk lean, decreased R knee/hip extension in stance, decreased R DF and knee flexion in swing. Provided max multimodal cues for sequencing, weight shifting, leading with his R heel for increased foot clearance, and increased  BOS.  Neuromuscular Re-ed: Patient performed the following R upper extremity motor control activities: -performed R reach and grasp for multiple different objects x2-3 trials (cone, palm sized ball, tissue box, and red clothes pin) on a table in front of him with moderate hand-over-hand facilitation for grip formation and reaching; patient had most success with smaller grip with reduced grasp through digits 4 and 5  -performed finger flexion/extension, elbow flexion/extension against resistance, and shoulder flexion x5 with min assist for complete range for shoulder flexion and max A for finger extension  Patient in TIS w/c in the room at end of session with breaks locked, seat belt alarm set, and all needs within reach.   Therapy Documentation Precautions:  Precautions Precautions: Fall Precaution Comments: R hemi, diplopia/visual deficits Restrictions Weight Bearing Restrictions: No     Therapy/Group: Individual Therapy  Kady Toothaker L Kinzee Happel PT, DPT  08/14/2021, 4:18 PM

## 2021-08-16 NOTE — Progress Notes (Signed)
PROGRESS NOTE   Subjective/Complaints: Pt up in bed. Slept ok. I commented on numerous cups of fruit in room and he said he liked peaches!  ROS: Patient denies fever, rash, sore throat, blurred vision, nausea, vomiting, diarrhea, cough, shortness of breath or chest pain, joint or back pain, headache, or mood change.   Objective:   No results found. No results for input(s): WBC, HGB, HCT, PLT in the last 72 hours.  No results for input(s): NA, K, CL, CO2, GLUCOSE, BUN, CREATININE, CALCIUM in the last 72 hours.   Intake/Output Summary (Last 24 hours) at 08/16/2021 1117 Last data filed at 08/16/2021 0102 Gross per 24 hour  Intake 378 ml  Output 875 ml  Net -497 ml        Physical Exam: Vital Signs Blood pressure 118/81, pulse 72, temperature 98.7 F (37.1 C), temperature source Oral, resp. rate 18, height 5\' 2"  (1.575 m), weight 98 kg, SpO2 100 %. Constitutional: No distress . Vital signs reviewed. HEENT: NCAT, EOMI, oral membranes moist Neck: supple Cardiovascular: RRR without murmur. No JVD    Respiratory/Chest: CTA Bilaterally without wheezes or rales. Normal effort    GI/Abdomen: BS +, non-tender, non-distended Ext: no clubbing, cyanosis, or edema Psych: pleasant and cooperative  Skin: intact Musc: No edema in extremities.  No tenderness in extremities. Neuro: Alert, partial INO Left pupil --no change Severe dysarthria improving but still can be limiting for communication.   Motor: RUE 5/5, RLE 4-5/5 LUE and LLE 3+ to 4/5--stable, with 1/2 sensation present. DTR's 1+. Ataxia L>R UE without change Sensation reduced on Left side    Assessment/Plan: 1. Functional deficits which require 3+ hours per day of interdisciplinary therapy in a comprehensive inpatient rehab setting. Physiatrist is providing close team supervision and 24 hour management of active medical problems listed below. Physiatrist and rehab team  continue to assess barriers to discharge/monitor patient progress toward functional and medical goals  Care Tool:  Bathing    Body parts bathed by patient: Chest, Abdomen, Right arm, Right upper leg, Left upper leg, Face   Body parts bathed by helper: Front perineal area, Buttocks, Left lower leg, Right lower leg, Left arm     Bathing assist Assist Level: Maximal Assistance - Patient 24 - 49%     Upper Body Dressing/Undressing Upper body dressing   What is the patient wearing?: Pull over shirt    Upper body assist Assist Level: Maximal Assistance - Patient 25 - 49%    Lower Body Dressing/Undressing Lower body dressing      What is the patient wearing?: Incontinence brief, Pants     Lower body assist Assist for lower body dressing: Total Assistance - Patient < 25%     Toileting Toileting    Toileting assist Assist for toileting: Total Assistance - Patient < 25%     Transfers Chair/bed transfer  Transfers assist  Chair/bed transfer activity did not occur: Safety/medical concerns  Chair/bed transfer assist level: Maximal Assistance - Patient 25 - 49% (stand pivot)     Locomotion Ambulation   Ambulation assist   Ambulation activity did not occur: Safety/medical concerns  Assist level: 2 helpers Assistive device: Hand held assist  Max distance: 5   Walk 10 feet activity   Assist     Assist level: 2 helpers (+3 WC follow) Assistive device: Hand held assist   Walk 50 feet activity   Assist Walk 50 feet with 2 turns activity did not occur: Safety/medical concerns         Walk 150 feet activity   Assist Walk 150 feet activity did not occur: Safety/medical concerns         Walk 10 feet on uneven surface  activity   Assist Walk 10 feet on uneven surfaces activity did not occur: Safety/medical concerns         Wheelchair     Assist Is the patient using a wheelchair?: No             Wheelchair 50 feet with 2 turns  activity    Assist            Wheelchair 150 feet activity     Assist          Blood pressure 118/81, pulse 72, temperature 98.7 F (37.1 C), temperature source Oral, resp. rate 18, height 5\' 2"  (1.575 m), weight 98 kg, SpO2 100 %.  Medical Problem List and Plan: 1. 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  Impaired mobility: -DVT/anticoagulation:  Pharmaceutical: Continue Lovenox. Monitoring creatinine on Thursdays             -antiplatelet therapy: Aspirin 81 mg daily and Brilinta 90 mg twice daily 3. Pain Management: Tylenol as needed 4. Depression/insomnia/agitation: Continue Zoloft 100 mg daily, melatonin 3 mg nightly             -antipsychotic agents: Seroquel 50 mg nightly for agitation  -mood reasonable 5. Neuropsych: This patient is not fully capable of making decisions on his own behalf. 6. Skin/Wound Care: Routine skin checks 7. Fluids/Electrolytes/Nutrition: encourage PO  -hyponatremia 136 11/21 8.  Hyperlipidemia.  Continue Lipitor 9.  History of alcohol and marijuana use.  Urine drug screen positive marijuana.  Counseling 10. Morbid Obesity- BMI 39.52- counseling and diet education 11. Elevated LFT's  11/22 -further increase in AST/ALT today  11/23 lipitor held-LFT's actually higher--repeat again 11/29, if further elevated, check hep panel 12. Mild leukocytosis: trending down  Afebrile  -no s/s infection  -improved   11/21 LOS: 11 days A FACE TO FACE EVALUATION WAS PERFORMED  12/21 08/16/2021, 11:17 AM

## 2021-08-16 NOTE — Progress Notes (Signed)
Speech Language Pathology Daily Session Note  Patient Details  Name: Roy [name] MRN: [id number] Date of Birth: 1996-02-13  Today's Date: 08/16/2021 SLP Individual Time: 1430-1500 SLP Individual Time Calculation (min): 30 min  Short Term Goals: Week 2: SLP Short Term Goal 1 (Week 2): Patient will demonstrate sustained attention to functional tasks for ~25 minutes with Mod verbal cues for redirection. SLP Short Term Goal 2 (Week 2): Patient will self-monitor and correct verbal perseveration at the word level with Max A verbal and visual cues in 25% of opportunities. SLP Short Term Goal 3 (Week 2): Patient will utilize diaphragmatic breathing at the phrase level with Mod A multimodal cues. SLP Short Term Goal 4 (Week 2): Patient will utilize speech intelligibility strategies at the phrase level with Max A multimodal cues to achieve ~75% intelligibility.  Skilled Therapeutic Interventions: Skilled treatment session focused on speech goals. SLP facilitated session by providing Mod A verbal cues for use of a slow rate, over-articulation, and easy onset while reading aloud at the phrase level. Mod verbal cues were also needed to self-monitor and correct errors to achieve ~75% intelligibility. Patient's girlfriend present and helped reinforce intelligibility strategies. Patient left upright in bed with alarm on and all needs within reach. Continue with current plan of care.      Pain No/Denies Pain   Therapy/Group: Individual Therapy  Nija Koopman 08/16/2021, 3:50 PM

## 2021-08-16 NOTE — Progress Notes (Signed)
Occupational Therapy Session Note  Patient Details  Name: Roy [name] MRN: [id number] Date of Birth: 08-05-1996  Today's Date: 08/16/2021 OT Individual Time: 1155-2080 OT Individual Time Calculation (min): 70 min   Short Term Goals: Week 2:  OT Short Term Goal 1 (Week 2): Pt will bring eating etensil to mouth with mod A OT Short Term Goal 2 (Week 2): Patient will complete 1 step of UB dressing task. OT Short Term Goal 3 (Week 2): Patient will complete toilet transfer with max A of 1 and LRAD  Skilled Therapeutic Interventions/Progress Updates:    Pt greeted seated in TIS wc and agreeable to OT treatment session focused on self-care retraining at shower level. Rehab tech present as +2. Sit<>stand in [place] with mod A today. Stedy transfer onto tub bench facing out. Worked on functional use of B UE's within bathing tasks. Pt able to get B hands up to head to assist with washing hair for ~ 30 second intervals.  B UE NMR with hand over hand on R hand to integrate into bathing tasks, then just guided A from L hand to wash stomach and L leg. Lateral leans in shower with max A for washing buttocks and lower limbs. Worked on anterior weight shift and forced use of B UE's within LB dressing tasks. Pt able to grasp briefly with R hand and try to pull, but unable to maintain. Sit<>stand with mod A +2, then +2 to pull pants up over hips. Blocked practice for UB dressing tasks with some improved coordination in L hand today with cues to look at L hand when moving it, still max A to get shirt on. Pt brought to therapy gym and B UE's placed on UE Ergometer handles. Pt able to grasp initially, but needed ACE wrap to maintain grasp. Pt with difficulty breaking up movement of pushing and pulling on opposite UE's requiring guided A from OT and moderate cues to push and pull when appropriate. OT issued soft pink squeeze foam to keep working on R hand grasp. Pt returned to room and left seated in TIS wc with alarm belt  on, call bell in reach, and needs met.   Therapy Documentation Precautions:  Precautions Precautions: Fall Precaution Comments: R hemi, diplopia/visual deficits Restrictions Weight Bearing Restrictions: No Pain:  Denies pain   Therapy/Group: Individual Therapy  Valma Cava 08/16/2021, 11:15 AM

## 2021-08-16 NOTE — Progress Notes (Signed)
Physical Therapy Session Note  Patient Details  Name: Roy [name] MRN: [id number] Date of Birth: 1995/12/16  Today's Date: 08/16/2021 PT Individual Time: 0802-0929 PT Individual Time Calculation (min): 87 min   Short Term Goals: Week 2:  PT Short Term Goal 1 (Week 2): Pt will perform bed mobility consistently with modA PT Short Term Goal 2 (Week 2): Pt will perform sit to stand transfer consistently with minA. PT Short Term Goal 3 (Week 2): Pt will perform bed to chair transfer consistently with modA. PT Short Term Goal 4 (Week 2): Pt will ambulate x25' with modA +2.  Skilled Therapeutic Interventions/Progress Updates:     Pt received supine in bed and agrees to therapy. No complaint of pain. PT assists pt with threading pant legs. Supine to sit using step-by-step verbal cueing for sequencing and mod manual assist for logrolling and sidelying to sit. Sit to stand with modA. Pt requires modA to maxA in standing while PT assists to pull up pants. Pt primarily with posterior bias and L lateral leaning. Stand step transfer to [facility] with modA and cues for weight shifting and positioning. RN present to provide pt with morning medicines.   WC transport to gym for time management. Session focused on standing balance and upright tolerance Multiple reps of sit to stand from [facility] to high low table with minA/modA, depending on pt's motor planning and adequate weight shifting. PT provides verbal and tactile cues for optimal sequencing and weight transition. In standing, pt positioned with forearms shoulder width apart and WB through table to provide NM feedback as well as approximation of upper extremity joints for NMR. Pt able to stand for 3-5 minute bouts with CGA to minA. Extended seated rest breaks between each bout with pt visibly perspiring.   WC transport back to room. Pt left seated with alarm intact and all needs within reach.   Therapy Documentation Precautions:  Precautions Precautions:  Fall Precaution Comments: R hemi, diplopia/visual deficits Restrictions Weight Bearing Restrictions: No    Therapy/Group: Individual Therapy  Beau Fanny, PT, DPT 08/16/2021, 4:09 PM

## 2021-08-17 LAB — HEPATITIS PANEL, ACUTE
HCV Ab: NONREACTIVE
Hep A IgM: NONREACTIVE
Hep B C IgM: NONREACTIVE
Hepatitis B Surface Ag: NONREACTIVE

## 2021-08-17 NOTE — Progress Notes (Signed)
Speech Language Pathology Daily Session Note  Patient Details  Name: Roy [name] MRN: [id number] Date of Birth: 30-Aug-1996  Today's Date: 08/17/2021 SLP Individual Time: 1430-1500 SLP Individual Time Calculation (min): 30 min  Short Term Goals: Week 2: SLP Short Term Goal 1 (Week 2): Patient will demonstrate sustained attention to functional tasks for ~25 minutes with Mod verbal cues for redirection. SLP Short Term Goal 2 (Week 2): Patient will self-monitor and correct verbal perseveration at the word level with Max A verbal and visual cues in 25% of opportunities. SLP Short Term Goal 3 (Week 2): Patient will utilize diaphragmatic breathing at the phrase level with Mod A multimodal cues. SLP Short Term Goal 4 (Week 2): Patient will utilize speech intelligibility strategies at the phrase level with Max A multimodal cues to achieve ~75% intelligibility.  Skilled Therapeutic Interventions: Skilled treatment session focused on speech goals. SLP facilitated session by providing overall Mod A verbal and visual cues for patient to utilize speech intelligibility strategies at the phrase level during an informal verbal description task. Overall, patient was ~75% intelligible. Patient reporting family is having difficulty understanding him while talking on the phone. SLP provided education regarding utilization of facetime to maximize visual feedback during verbal expression. Patient verbalized understanding. Patient left upright in bed with alarm on and all needs within reach. Continue with current plan of care.      Pain No/Denies Pain   Therapy/Group: Individual Therapy  Marilyn Nihiser 08/17/2021, 3:16 PM

## 2021-08-17 NOTE — Patient Care Conference (Signed)
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 Faith Rogue Social Worker Present: Cecile Sheerer, LCSWA Nurse Present: Kennyth Arnold, RN PT Present: Malachi Pro, PT OT Present: Kearney Hard, OT SLP Present: Feliberto Gottron, SLP PPS Coordinator present : Fae Pippin, SLP     Current Status/Progress Goal Weekly Team Focus  Bowel/Bladder   Incontinent of B/B. LBM 11/28  regain contenience  Toilet Q2-3hr.   Swallow/Nutrition/ Hydration             ADL's   Max A overall, global weakness, ataxia slightly improved, but still working on using UE's functionally, max A transfers, can stand in [place] with mod A  Min A  B UE NMR, sit<>stands, transfers, self-care retraining, cognitive retraining   Mobility   modA/maxA bed mobility, minA/modA sit to stand, modA +2 x50' with HHA  minA  coordination, balance, NMR, bed mobility, core strength, ambulation   Communication   Mod-Max A  Min A  use of over-articulation, easy onset, diaphragmatic  breathing, self-monitoring and correcting of verbal perseveration   Safety/Cognition/ Behavioral Observations  Mod A  Min A  sustained attention, awareness, basic problem solving   Pain   no c/o pain  pain <3  Assess Qshift and prn   Skin   skin intact  prevent new skin breakdown  Assess Qshift  and prn     Discharge Planning:  Pt is uninsured. Pt to d/c to home with his sister who does not work. Support from other family as well. SW will confirm no barriers to discharge.   Team Discussion: LFT's rising. Will order Hep panel. Has PRN's for reported pain. Uninsured. Will need to schedule a family meeting. Slow progress with therapy.  Patient on target to meet rehab goals: Max assist overall currently. Standing in steady can be mod assist, heavy max assist to keep from falling over. Ataxic with lack of coordination. Having hard time managing breath support, short-term memory, and sustained attention. Min assist goals.  *See Care Plan and progress notes for long and short-term goals.   Revisions to Treatment Plan:  Rechecking labs and ordering Hep panel, adjusting medications.  Teaching Needs: Family education, medication/pain management, skin/wound care, safety awareness, transfer training, etc.  Current Barriers to Discharge: Decreased caregiver support, Home enviroment access/layout, Wound care, Lack of/limited family support, and Medication compliance  Possible Resolutions to Barriers: Family education Provide HEP  Pharmacy [facility]     Medical Summary Current Status: ongoing ataxia and dysarthria. ongoing pain--managing with meds. LFT's elevated---further work up needed  Barriers to Discharge: Medical stability   Possible Resolutions to [employer]: daily assessment of labs, pain control, pt data   Continued Need for Acute Rehabilitation Level of Care: The patient requires daily medical management by a physician with specialized training in physical medicine and rehabilitation for the following reasons: Direction of  a multidisciplinary physical rehabilitation program to maximize functional independence : Yes Medical management of patient stability for increased activity during participation in an intensive rehabilitation regime.: Yes Analysis of laboratory values  and/or radiology reports with any subsequent need for medication adjustment and/or medical intervention. : Yes   I attest that I was present, lead the team conference, and concur with the assessment and plan of the team.   Tennis Must 08/17/2021, 2:20 PM

## 2021-08-17 NOTE — Progress Notes (Signed)
Physical Therapy Session Note  Patient Details  Name: Roy [name] MRN: [id number] Date of Birth: 1996/04/08  Today's Date: 08/17/2021 PT Individual Time: 0902-0959 PT Individual Time Calculation (min): 57 min   Short Term Goals: Week 2:  PT Short Term Goal 1 (Week 2): Pt will perform bed mobility consistently with modA PT Short Term Goal 2 (Week 2): Pt will perform sit to stand transfer consistently with minA. PT Short Term Goal 3 (Week 2): Pt will perform bed to chair transfer consistently with modA. PT Short Term Goal 4 (Week 2): Pt will ambulate x25' with modA +2.  Skilled Therapeutic Interventions/Progress Updates:     Pt received supine in bed and agrees to therapy. Supine to sit with modA and cues for sequencing, hand placement, and body mechanics. No complaint of pain. Sit to stand with modA with multimodal cues for anterior weight transition. In standing PT provides miNA-maxA due to lack of proprioception and pt heavily leaning in all directions, L>R. PT provides maxA for stand step transfer to [facility]. WC transport to gym for time management. Stand step transfer from [facility] to mat with modA and improved safety and sequencing relative to previous transfers. Pt works on sitting balance in short sitting at edge of mat. Mirror provided for visual feedback. PT provides verbal and tactile cues for anterior pelvic tilt and upright posture. Pt then practices coming into sideleaning on R and L sides, WB through alternating elbows, then pressing back up to midline sitting. PT provides manual facilitation of lateral pelvic tilting and NM feedback through sidebody for increased core activation. Pt requires minA/modA to complete.   Pt performs sit to supine with modA and cues for sequencing. Pt practices rolling on mat to work on sequencing, as well as motor planning and coordination of movement for improved functional mobility. PT provides verbal and tactile cues for logrolling technique, incorporating  momentum and reaching with upper extremities. Pt typically requires minA to complete roll but on several attempts is able to complete without physical assistance. Supine to sit from flat mat with modA. Stand step transfer to [facility] with modA and cues for posture, body mechanics, and sequencing. Pt left seated with alarm intact and all needs within reach.  Therapy Documentation Precautions:  Precautions Precautions: Fall Precaution Comments: R hemi, diplopia/visual deficits Restrictions Weight Bearing Restrictions: No  Therapy/Group: Individual Therapy  Beau Fanny, PT, DPT 08/17/2021, 1:51 PM

## 2021-08-17 NOTE — Progress Notes (Addendum)
Patient ID: Roy [name], male   DOB: 1996/09/13, 25 y.o.   MRN: [id number]  SW spoke with pt aunt Roy [name] [phone]) to provide updates from team conference, and d/c date remains 12/16. Sw discussed family meeting. Available for family mtg on 12/9 at 10am. She will confirm with SW who all will be present for family meeting.Pt aunt asked if pt will be eligible for Medicaid. SW informed once confirming with physician if he will have a disability greater than 12 months will update financial counselors since pt is listed as not eligible for MCD (per [facility] acct). SW discussed if any success with finding DME. Reports she discussed with his brother who reports he will get the items.   *SW confirmed with attending pt will have disability greater than 12 months. SW sent email to Roy [name], Roy [name] and Roy [name] to ask them to screen patient for MCD and SSD and to contact his aunt Roy [name]. SW left message for his aunt Roy [name] with above updates.   Pt scheduled for hospital follow-up appt with Dr. Laural [name] on Monday, January 12 at 9:30am with Roy [name]. Request for financial counseling to be discussed.   Cecile Sheerer, MSW, LCSWA Office: [phone] Cell: [phone] Fax: [phone]

## 2021-08-17 NOTE — Progress Notes (Signed)
Occupational Therapy Session Note  Patient Details  Name: Roy [name] MRN: [id number] Date of Birth: [birth year]  Today's Date: 08/17/2021 OT Individual Time: 1130-1200 OT Individual Time Calculation (min): 30 min    Short Term Goals: Week 2:  OT Short Term Goal 1 (Week 2): Pt will bring eating etensil to mouth with mod A OT Short Term Goal 2 (Week 2): Patient will complete 1 step of UB dressing task. OT Short Term Goal 3 (Week 2): Patient will complete toilet transfer with max A of 1 and LRAD  Skilled Therapeutic Interventions/Progress Updates:    Pt received sitting up in the w/c with no c/o pain. Pt completed the BITS for functional reaching bilaterally to address coordination, motor planning, and accuracy. Average reaction of 18% accuracy and min facilitation required at the RUE for shoulder flexion >65 degrees. Circles were increased to largest size to increase target size and reduce coordination demands. Pt then completed closed chain functional reaching with a large ball, requiring mod facilitation for the RUE to maintain contact with the ball. Pt was returned to his room and was left sitting up with all needs met, chair alarm belt set.   Therapy Documentation Precautions:  Precautions Precautions: Fall Precaution Comments: R hemi, diplopia/visual deficits Restrictions Weight Bearing Restrictions: No   Therapy/Group: Individual Therapy  Curtis Sites 08/17/2021, 6:22 AM

## 2021-08-17 NOTE — Progress Notes (Signed)
Occupational Therapy Session Note  Patient Details  Name: Roy [name] MRN: [id number] Date of Birth: May 05, 1996  Today's Date: 08/17/2021 OT Individual Time: 1245-1400 OT Individual Time Calculation (min): 75 min    Short Term Goals: Week 2:  OT Short Term Goal 1 (Week 2): Pt will bring eating etensil to mouth with mod A OT Short Term Goal 2 (Week 2): Patient will complete 1 step of UB dressing task. OT Short Term Goal 3 (Week 2): Patient will complete toilet transfer with max A of 1 and LRAD  Skilled Therapeutic Interventions/Progress Updates:    Pt greeted seated in TIS wc after finishing lunch and agreeable to OT treatment session. Pt wanted to wash up today. Focus on functional use of B UE's within bathing tasks for neuro re-ed. Noted increased resting tremor on L hand today, but decreased with intentional movement. Pt able to get R hand to L forearm today and push downward and back up. Pt then able to wash under arms with guided A from OT. Improved coordination and ability to maintain grasp on wash cloth with L hand today as well. Pt able to grasp toothbrush with L hand and bring to mouth to brush teeth today! Sit<>stands at the sink with mod A, requiring mod/max A for standing balance while +2 assisted with washing buttocks and peri-area, then donning clean brief. Mirror feedback for balance. Blocked practice for donning shirt with pt able to thread R UE with min A today, but then needed max A to complete task. OT adjusted tape on glasses per pt request, then pt brought down to therapy gym. Visual scanning and functional use of B UE's with BITS activity using large dots on contrasted background. Focus on achieving finger isolation which pt was able to do intermittently on L hand, but unable to on R. Guided A to accurately hit dots on B UE's. Pt returned to room and Stedy used to transfer back to bed with mod A, then max+2 to transfer back to supine. Pt left semi-reclined in bed with bed  alarm on, call bell in reach and needs met.   Therapy Documentation Precautions:  Precautions Precautions: Fall Precaution Comments: R hemi, diplopia/visual deficits Restrictions Weight Bearing Restrictions: No Pain:  Denies pain   Therapy/Group: Individual Therapy  Valma Cava 08/17/2021, 2:07 PM

## 2021-08-17 NOTE — Progress Notes (Signed)
PROGRESS NOTE   Subjective/Complaints: Pt slept ok. No new complaints. Having pain in back at times.   ROS: Patient denies fever, rash, sore throat, blurred vision, nausea, vomiting, diarrhea, cough, shortness of breath or chest pain, joint or back pain, headache, or mood change.   Objective:   No results found. No results for input(s): WBC, HGB, HCT, PLT in the last 72 hours.  No results for input(s): NA, K, CL, CO2, GLUCOSE, BUN, CREATININE, CALCIUM in the last 72 hours.   Intake/Output Summary (Last 24 hours) at 08/17/2021 1053 Last data filed at 08/16/2021 2130 Gross per 24 hour  Intake 472 ml  Output 500 ml  Net -28 ml        Physical Exam: Vital Signs Blood pressure 97/69, pulse 69, temperature 98.1 F (36.7 C), temperature source Oral, resp. rate 18, height 5\' 2"  (1.575 m), weight 98 kg, SpO2 94 %. Constitutional: No distress . Vital signs reviewed. HEENT: NCAT, EOMI, oral membranes moist Neck: supple Cardiovascular: RRR without murmur. No JVD    Respiratory/Chest: CTA Bilaterally without wheezes or rales. Normal effort    GI/Abdomen: BS +, non-tender, non-distended Ext: no clubbing, cyanosis, or edema Psych: pleasant and cooperative  Skin: intact Musc: No edema in extremities.  No tenderness in extremities. Neuro: Alert, partial INO Left pupil --no change Severe dysarthria remains. Motor: RUE 5/5, RLE 4-5/5 LUE and LLE 3+ to 4/5--stable, with 1/2 sensation present. DTR's 1+. Ataxia L>R UE persists.  Sensation reduced on Left side    Assessment/Plan: 1. Functional deficits which require 3+ hours per day of interdisciplinary therapy in a comprehensive inpatient rehab setting. Physiatrist is providing close team supervision and 24 hour management of active medical problems listed below. Physiatrist and rehab team continue to assess barriers to discharge/monitor patient progress toward functional and medical  goals  Care Tool:  Bathing    Body parts bathed by patient: Chest, Abdomen, Right arm, Right upper leg, Left upper leg, Face   Body parts bathed by helper: Front perineal area, Buttocks, Left lower leg, Right lower leg, Left arm     Bathing assist Assist Level: Maximal Assistance - Patient 24 - 49%     Upper Body Dressing/Undressing Upper body dressing   What is the patient wearing?: Pull over shirt    Upper body assist Assist Level: Maximal Assistance - Patient 25 - 49%    Lower Body Dressing/Undressing Lower body dressing      What is the patient wearing?: Incontinence brief, Pants     Lower body assist Assist for lower body dressing: Total Assistance - Patient < 25%     Toileting Toileting    Toileting assist Assist for toileting: Total Assistance - Patient < 25%     Transfers Chair/bed transfer  Transfers assist  Chair/bed transfer activity did not occur: Safety/medical concerns  Chair/bed transfer assist level: Maximal Assistance - Patient 25 - 49% (stand pivot)     Locomotion Ambulation   Ambulation assist   Ambulation activity did not occur: Safety/medical concerns  Assist level: 2 helpers Assistive device: Hand held assist Max distance: 5   Walk 10 feet activity   Assist     Assist  level: 2 helpers (+3 WC follow) Assistive device: Hand held assist   Walk 50 feet activity   Assist Walk 50 feet with 2 turns activity did not occur: Safety/medical concerns         Walk 150 feet activity   Assist Walk 150 feet activity did not occur: Safety/medical concerns         Walk 10 feet on uneven surface  activity   Assist Walk 10 feet on uneven surfaces activity did not occur: Safety/medical concerns         Wheelchair     Assist Is the patient using a wheelchair?: No             Wheelchair 50 feet with 2 turns activity    Assist            Wheelchair 150 feet activity     Assist          Blood  pressure 97/69, pulse 69, temperature 98.1 F (36.7 C), temperature source Oral, resp. rate 18, height 5\' 2"  (1.575 m), weight 98 kg, SpO2 94 %.  Medical Problem List and Plan: 1. 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 Interdisciplinary team conference today to discuss goals, barriers to discharge, and dc planning.   2.  Impaired mobility: -DVT/anticoagulation:  Pharmaceutical: Continue Lovenox. Monitoring creatinine on Thursdays             -antiplatelet therapy: Aspirin 81 mg daily and Brilinta 90 mg twice daily 3. Pain Management: Tylenol as needed 4. Depression/insomnia/agitation: Continue Zoloft 100 mg daily, melatonin 3 mg nightly             -antipsychotic agents: Seroquel 50 mg nightly for agitation  -mood reasonable 5. Neuropsych: This patient is not fully capable of making decisions on his own behalf. 6. Skin/Wound Care: Routine skin checks 7. Fluids/Electrolytes/Nutrition: encourage PO  -hyponatremia 136 11/21 8.  Hyperlipidemia.  Continue Lipitor 9.  History of alcohol and marijuana use.  Urine drug screen positive marijuana.  Counseling 10. Morbid Obesity- BMI 39.52- counseling and diet education 11. Elevated LFT's  11/29 further drift upward today--check hepatitis panel 12. Mild leukocytosis: trending down  Afebrile  -no s/s infection  -improved   11/21   LOS: 12 days A FACE TO FACE EVALUATION WAS PERFORMED  12/21 08/17/2021, 10:53 AM

## 2021-08-18 NOTE — Progress Notes (Signed)
Occupational Therapy Session Note  Patient Details  Name: Roy [name] MRN: [id number] Date of Birth: March 08, 1996  Today's Date: 08/18/2021 OT Individual Time: 0930-1030 OT Individual Time Calculation (min): 60 min    Short Term Goals: Week 2:  OT Short Term Goal 1 (Week 2): Pt will bring eating etensil to mouth with mod A OT Short Term Goal 2 (Week 2): Patient will complete 1 step of UB dressing task. OT Short Term Goal 3 (Week 2): Patient will complete toilet transfer with max A of 1 and LRAD  Skilled Therapeutic Interventions/Progress Updates:    Pt received sitting in the w/c with no c/o pain, agreeable to OT session. Started session with visit from the therapy dog Dixie. Pt was able to reach out and pet Dixie several times. Pt completed a stand pivot transfer with mod +2 assist from the w/c to the mat. Pt was assisted in donning two boxing gloves for boxing type activity- mod A to straighten his hands out to don. Focus on reciprocal functional reaching and motor planning with facilitation for coordination and increasing power of strike. Several trials completed, activity graded as fatigue progressed and in standing. Pt was returned to his room and was left sitting up with all needs met, chair alarm set.   Therapy Documentation Precautions:  Precautions Precautions: Fall Precaution Comments: R hemi, diplopia/visual deficits Restrictions Weight Bearing Restrictions: No  Therapy/Group: Individual Therapy  Curtis Sites 08/18/2021, 6:18 AM

## 2021-08-18 NOTE — Progress Notes (Signed)
Speech Language Pathology Daily Session Note  Patient Details  Name: Roy [name] MRN: [id number] Date of Birth: [birth year]  Today's Date: 08/18/2021 SLP Individual Time: 0915-1000 SLP Individual Time Calculation (min): 45 min  Short Term Goals: Week 2: SLP Short Term Goal 1 (Week 2): Patient will demonstrate sustained attention to functional tasks for ~25 minutes with Mod verbal cues for redirection. SLP Short Term Goal 2 (Week 2): Patient will self-monitor and correct verbal perseveration at the word level with Max A verbal and visual cues in 25% of opportunities. SLP Short Term Goal 3 (Week 2): Patient will utilize diaphragmatic breathing at the phrase level with Mod A multimodal cues. SLP Short Term Goal 4 (Week 2): Patient will utilize speech intelligibility strategies at the phrase level with Max A multimodal cues to achieve ~75% intelligibility.  Skilled Therapeutic Interventions: Skilled treatment session focused on communication goals. Upon arrival, patient was awake in the wheelchair but appeared down due to current family dynamics. SLP provided emotional support. Throughout informal conversation, patient was ~75% intelligible with increased use of speech strategies noted. Patient participated in a task that focused on over-articulation utilizing minimal pairs. Patient completed task with overall Min verbal cues to self-monitor and correct phonemic errors. Patient reports difficulty with communicating wants/needs with nursing, therefore, SLP provided a simple communication board to utilize during communication breakdown. Patient left upright in bed the wheelchair with alarm on and all needs within reach. Continue with current plan of care.      Pain No/Denies Pain   Therapy/Group: Individual Therapy  Carely Nappier 08/18/2021, 2:49 PM

## 2021-08-18 NOTE — Progress Notes (Signed)
Occupational Therapy Session Note  Patient Details  Name: Roy [name] MRN: [id number] Date of Birth: 08/15/1996  Today's Date: 08/18/2021 OT Individual Time: 1500-1525 OT Individual Time Calculation (min): 25 min    Short Term Goals: Week 1:  OT Short Term Goal 1 (Week 1): Patient will perform sit<>stand with max A of 1 and LRAD in preparation for BADL tasks OT Short Term Goal 1 - Progress (Week 1): Met OT Short Term Goal 2 (Week 1): Pt will bring eating etensil to mouth with min A OT Short Term Goal 2 - Progress (Week 1): Progressing toward goal OT Short Term Goal 3 (Week 1): Patient will maintain grasp on toothbrush using adaptive equipment and brush for 30 seconds OT Short Term Goal 3 - Progress (Week 1): Met OT Short Term Goal 4 (Week 1): Patient will complete 1 step of UB dressing task. OT Short Term Goal 4 - Progress (Week 1): Progressing toward goal Week 2:  OT Short Term Goal 1 (Week 2): Pt will bring eating etensil to mouth with mod A OT Short Term Goal 2 (Week 2): Patient will complete 1 step of UB dressing task. OT Short Term Goal 3 (Week 2): Patient will complete toilet transfer with max A of 1 and LRAD   Skilled Therapeutic Interventions/Progress Updates:    Pt greeted at time of session semireclined in bed resting, but agreeable to OT session. No pain reported. Pt wanting to get OOB, supine > sit extended time and cues to use bed rails, Min/Mod for scooting to EOB. Stand pivot bed > wheelchair Mod A x 2 with 2nd helper posterior for safety, no AD. Pt remembering hand placement from previous sessions. Set up at sink and pt dancing to his music seated, switched to standing at sink surface with Mod A for sit > stand and able to stand at sink while dancing to music of choice to improve standing balance/tolerance and body awareness. Pt able to stand Mod A overall, extensor tone in BLEs, for approx 2-3 minutes. Returned to sitting, TIS reclined, alarm on, soft call bell in reach  on shirt.   Therapy Documentation Precautions:  Precautions Precautions: Fall Precaution Comments: R hemi, diplopia/visual deficits Restrictions Weight Bearing Restrictions: No     Therapy/Group: Individual Therapy   C  08/18/2021, 7:25 AM 

## 2021-08-18 NOTE — Progress Notes (Signed)
Physical Therapy Session Note  Patient Details  Name: Roy [name] MRN: [id number] Date of Birth: February 18, 1996  Today's Date: 08/18/2021 PT Individual Time: 0805-0915 PT Individual Time Calculation (min): 70 min   Short Term Goals: Week 2:  PT Short Term Goal 1 (Week 2): Pt will perform bed mobility consistently with modA PT Short Term Goal 2 (Week 2): Pt will perform sit to stand transfer consistently with minA. PT Short Term Goal 3 (Week 2): Pt will perform bed to chair transfer consistently with modA. PT Short Term Goal 4 (Week 2): Pt will ambulate x25' with modA +2.  Skilled Therapeutic Interventions/Progress Updates:     Pt received supine in bed and agrees to therapy. No complaint of pain. Supine to sit with improved logrolling and requiring minA/modA at hips and trunk to perform sidelying to sit. Sit to stand with minA, and static standing balance requiring as much as modA dur to lack of motor control. Stand step transfer to [facility] with modA/maxA and step by step verbal cues for weight shifting and step sequencing. WC transport to gym for time management. Pt completes stand step transfer to mat with modA and same step by step cueing. Session focused on NMR for standing balance, with mirror for visual feedback. Multiple reps of sit to stand with minA +2 with PT and rehab tech on either side of pt and facilitating anterior weight shift. In standing, pt performs targeted stepping with both feet, depending on PT command. PT provides step by step verbal cues for weight shifting and stepping, with manual cueing at hips to promote increased stability via glute medius activation. Pt performs with as little as minA +2 but typically modA +2, especially when weight does not adequately shift to stance foot. Pt perspires heavily and multiple extended seated rest breaks required throughout activity. Stand step transfer back to [facility] with modA. Left seated with all needs within reach.  Therapy  Documentation Precautions:  Precautions Precautions: Fall Precaution Comments: R hemi, diplopia/visual deficits Restrictions Weight Bearing Restrictions: No    Therapy/Group: Individual Therapy  Beau Fanny, PT, DPT 08/18/2021, 4:28 PM

## 2021-08-18 NOTE — Progress Notes (Signed)
Patient ID: Roy [name], male   DOB: [birth year], 25 y.o.   MRN: [id number]  SW received updates from speech therapy that pt did not want SW to have his aunt Roy [name] listed as primary contact. SW pet with pt and speech therapist to discuss further. SW called pt aunt Roy [name] for conference call to discuss patient concerns. Pt would like his sister Roy [name] [phone]) to be primary contact. SW called pt sister Roy [name] to inform on updates, and inform on Family mtg next week on 12/9 at 10am. She indicated she was already aware of meeting as she has been speaking with her aunt Roy [name]. Intends to have aunt Roy [name], sister Roy [name] and girlfriend Roy [name] present for this meeting. SW informed will continue to follow-up with updates.   Cecile Sheerer, MSW, LCSWA Office: [phone] Cell: [phone] Fax: [phone]

## 2021-08-19 LAB — CREATININE, SERUM
Creatinine, Ser: 1.03 mg/dL (ref 0.61–1.24)
GFR, Estimated: >60 mL/min (ref >60)

## 2021-08-19 NOTE — Progress Notes (Signed)
Occupational Therapy Session Note  Patient Details  Name: Roy [name] MRN: [id number] Date of Birth: [birth year]  Today's Date: 08/19/2021 OT Individual Time: 1048-1200 OT Individual Time Calculation (min): 72 min   Short Term Goals: Week 2:  OT Short Term Goal 1 (Week 2): Pt will bring eating etensil to mouth with mod A OT Short Term Goal 2 (Week 2): Patient will complete 1 step of UB dressing task. OT Short Term Goal 3 (Week 2): Patient will complete toilet transfer with max A of 1 and LRAD  Skilled Therapeutic Interventions/Progress Updates:    Pt greeted seated in TIS wc and agreeable to OT treatment session. Pt requesting to shower today. Stedy used for transfer into shower with mod A to get to standing and assistance to get B hands to bar. Bathing completed from tub bench with focus on use of B UE's to wash body and hair for neuro re-ed. Improved push and pull form R arm today with OT assist to maintain grasp on wash cloth. OT issued LH sponge to increase access to lower body. Lateral leans to wash buttocks with OT assist. Dressing tasks completed from wc with focus on anterior weight shift, motor planning, and sit<>stands, Facilitation at hips and chest to promote full hip and trunk extension in standing. Pt brought to therapy dayroom and addressed fine motor coordination with large peg board task. Pt able to grasp with L hand, but then needed guided A to place in peg board de to over and undershooting. With R hand, pt able to achieve enough grasp, but needed more assistance from elbow and shoulder to get up to the peg board. Pt returned to room and left seated in TIS wc with alarm belt on, gf present, and needs met.   Therapy Documentation Precautions:  Precautions Precautions: Fall Precaution Comments: R hemi, diplopia/visual deficits Restrictions Weight Bearing Restrictions: No Pain: Pain Assessment Pain Scale: 0-10 Pain Score: 0-No pain   Therapy/Group: Individual  Therapy  Valma Cava 08/19/2021, 11:25 AM

## 2021-08-19 NOTE — Progress Notes (Signed)
Speech Language Pathology Daily Session Note  Patient Details  Name: Roy [name] MRN: [id number] Date of Birth: January 05, 1996  Today's Date: 08/19/2021 SLP Individual Time: 1005-1045 SLP Individual Time Calculation (min): 40 min  Short Term Goals: Week 2: SLP Short Term Goal 1 (Week 2): Patient will demonstrate sustained attention to functional tasks for ~25 minutes with Mod verbal cues for redirection. SLP Short Term Goal 2 (Week 2): Patient will self-monitor and correct verbal perseveration at the word level with Max A verbal and visual cues in 25% of opportunities. SLP Short Term Goal 3 (Week 2): Patient will utilize diaphragmatic breathing at the phrase level with Mod A multimodal cues. SLP Short Term Goal 4 (Week 2): Patient will utilize speech intelligibility strategies at the phrase level with Max A multimodal cues to achieve ~75% intelligibility.  Skilled Therapeutic Interventions: Skilled treatment session focused on cognitive goals. SLP facilitated session by providing Min A verbal cues for patient to self-monitor and correct phonemic errors while producing single words that focused on /ch/ in all positions (per patient's request). Mod verbal cues were needed for breath support, articulatory placement and easy onset throughout task. Overall, patient's speech intelligibility has improved at both the word and phrase level for functional communication to achieve ~75-80% intelligibility. Patient handed off to OT. Continue with current plan of care.      Pain Pain Assessment Pain Scale: 0-10 Pain Score: 0-No pain  Therapy/Group: Individual Therapy  Roy [name] 08/19/2021, 1:20 PM

## 2021-08-19 NOTE — Progress Notes (Signed)
PROGRESS NOTE   Subjective/Complaints: Pt in good spirits. [birth year] today! Therapy has noticed a resting tremor in RLE esp which hasn't been interfering with any functional activities  ROS: Patient denies fever, rash, sore throat, blurred vision, nausea, vomiting, diarrhea, cough, shortness of breath or chest pain, joint or back pain, headache, or mood change.   Objective:   No results found. No results for input(s): WBC, HGB, HCT, PLT in the last 72 hours.  Recent Labs    08/19/21 0505  CREATININE 1.03     Intake/Output Summary (Last 24 hours) at 08/19/2021 0917 Last data filed at 08/19/2021 0700 Gross per 24 hour  Intake 830 ml  Output 925 ml  Net -95 ml        Physical Exam: Vital Signs Blood pressure 101/72, pulse 76, temperature 97.9 F (36.6 C), temperature source Oral, resp. rate 18, height 5\' 2"  (1.575 m), weight 98 kg, SpO2 96 %. Constitutional: No distress . Vital signs reviewed. HEENT: NCAT, EOMI, oral membranes moist Neck: supple Cardiovascular: RRR without murmur. No JVD    Respiratory/Chest: CTA Bilaterally without wheezes or rales. Normal effort    GI/Abdomen: BS +, non-tender, non-distended Ext: no clubbing, cyanosis, or edema Psych: pleasant and cooperative  Skin: intact Musc: No edema in extremities.  No tenderness in extremities. Neuro: Alert, partial INO Left pupil --no change Severe dysarthria remains, voice out of sync with breathing. Motor: RUE 5/5, RLE 4-5/5 LUE and LLE 3+ to 4/5--stable, with 1/2 sensation present. DTR's 1+. Ataxia L>R UE is ongoing. Saw no intentional or resting tremors on exam today. Good sitting balance.  Sensation reduced on Left side    Assessment/Plan: 1. Functional deficits which require 3+ hours per day of interdisciplinary therapy in a comprehensive inpatient rehab setting. Physiatrist is providing close team supervision and 24 hour management of  active medical problems listed below. Physiatrist and rehab team continue to assess barriers to discharge/monitor patient progress toward functional and medical goals  Care Tool:  Bathing    Body parts bathed by patient: Chest, Abdomen, Right arm, Right upper leg, Left upper leg, Face   Body parts bathed by helper: Front perineal area, Buttocks, Left lower leg, Right lower leg, Left arm     Bathing assist Assist Level: Maximal Assistance - Patient 24 - 49%     Upper Body Dressing/Undressing Upper body dressing   What is the patient wearing?: Pull over shirt    Upper body assist Assist Level: Maximal Assistance - Patient 25 - 49%    Lower Body Dressing/Undressing Lower body dressing      What is the patient wearing?: Incontinence brief, Pants     Lower body assist Assist for lower body dressing: Total Assistance - Patient < 25%     Toileting Toileting    Toileting assist Assist for toileting: Total Assistance - Patient < 25%     Transfers Chair/bed transfer  Transfers assist  Chair/bed transfer activity did not occur: Safety/medical concerns  Chair/bed transfer assist level: Maximal Assistance - Patient 25 - 49% (stand pivot)     Locomotion Ambulation   Ambulation assist   Ambulation activity did not occur: Safety/medical concerns  Assist level: 2 helpers Assistive device: Hand held assist Max distance: 5   Walk 10 feet activity   Assist     Assist level: 2 helpers (+3 WC follow) Assistive device: Hand held assist   Walk 50 feet activity   Assist Walk 50 feet with 2 turns activity did not occur: Safety/medical concerns         Walk 150 feet activity   Assist Walk 150 feet activity did not occur: Safety/medical concerns         Walk 10 feet on uneven surface  activity   Assist Walk 10 feet on uneven surfaces activity did not occur: Safety/medical concerns         Wheelchair     Assist Is the patient using a wheelchair?:  No             Wheelchair 50 feet with 2 turns activity    Assist            Wheelchair 150 feet activity     Assist          Blood pressure 101/72, pulse 76, temperature 97.9 F (36.6 C), temperature source Oral, resp. rate 18, height 5\' 2"  (1.575 m), weight 98 kg, SpO2 96 %.  Medical Problem List and Plan: 1. 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    2.  Impaired mobility: -DVT/anticoagulation:  Pharmaceutical: Continue Lovenox. Monitoring creatinine on Thursdays             -antiplatelet therapy: Aspirin 81 mg daily and Brilinta 90 mg twice daily 3. Pain Management: Tylenol as needed 4. Depression/insomnia/agitation: Continue Zoloft 100 mg daily, melatonin 3 mg nightly             -antipsychotic agents: Seroquel 50 mg nightly for agitation  -mood positive 5. Neuropsych: This patient is not fully capable of making decisions on his own behalf. 6. Skin/Wound Care: Routine skin checks 7. Fluids/Electrolytes/Nutrition: encourage PO  -hyponatremia 136 11/21 8.  Hyperlipidemia.  Continue Lipitor 9.  History of alcohol and marijuana use.  Urine drug screen positive marijuana.  Counseling 10. Morbid Obesity- BMI 39.52- counseling and diet education 11. Elevated LFT's  12/1 hep panel negative, HIV neg  -recheck LFT's in am. May be stressed induced. 12. Mild leukocytosis: trending down  Afebrile  -no s/s infection  -improved   11/21   LOS: 14 days A FACE TO FACE EVALUATION WAS PERFORMED  12/21 08/19/2021, 9:17 AM

## 2021-08-19 NOTE — Progress Notes (Signed)
Physical Therapy Session Note  Patient Details  Name: Roy [name] MRN: [id number] Date of Birth: 12-May-1996  Today's Date: 08/19/2021 PT Individual Time: 0805-0900 and 1402-1430 PT Individual Time Calculation (min): 55 min and 28 min  Short Term Goals: Week 2:  PT Short Term Goal 1 (Week 2): Pt will perform bed mobility consistently with modA PT Short Term Goal 2 (Week 2): Pt will perform sit to stand transfer consistently with minA. PT Short Term Goal 3 (Week 2): Pt will perform bed to chair transfer consistently with modA. PT Short Term Goal 4 (Week 2): Pt will ambulate x25' with modA +2.  Skilled Therapeutic Interventions/Progress Updates:     1st Session: Pt co-treated with fellow PT for 2 sets of skilled hands and peer feedback. Pt received supine in bed and agrees to therapy. No complaint of pain. Pt performs supine to R sidelying with modA and multimodal cues for sequencing. MinA/modA for sidelying to sitting with cues at trunk and hips. Sit to stand with minA to guide pt forward. Stand step transfer slowly to [facility] with modA +1 and step-by-step cues for sequencing and weight shifting. WC transport to gym for time management. Stand step transfer to mat with modA and same cueing. Pt performs sit to stand to with minA +2 HHA, ambulating x25' slowly with modA +2 R HHA and PT providing trunk support on L side. Multimodal cues for lateral weight shifting and progression of contralateral foot. Cueing as well to maintain shorter, more controlled stride lengths and maintaining adequate BOS as pt has tendency for scissoring gait, but is able to correct with consistent cueing. Following seated rest break, pt continues gait training with EVA walker to allow for increased upright posture, WB through bilateral upper extremities. PT provides resistance on EVA walker to control propulsion, while other PT provides tactile cues at hips and trunk for posture and sequencing of gait progression. Pt completes 97'  and 33' with seated rest break in between. Pt has improved gait pattern when cued for shorter strides as well as consistent cueing for trunk and hip extension.  Pt left seated in WC with all needs within reach.  2nd session: Pt received seated in [facility] and agrees to therapy. No complaint of pain. WC transport to gym for time management. Pt performs NMR for standing balance in parallel bars. Multiple reps of sit to stand with minA to modA, depending on sequencing and body mechanics. Pt has bilateral upper extremity support on parallel bars. In standing, pt provided with multimodal cueing, including visual feedback from  mirror, on posture and trunk and hip extension to maintain COG over BOS. Pt able to maintain static standing balance with CGA for 10-15 seconds at a time. Pt progresses to performing marching in place with minA/modA for balance, and cues for weight shifting and foot positioning. WC transport back to room. Stand step transfer to bed with modA. Sit to supine with modA and cues for sequencing. Left supine with alarm intact and all needs within reach.  Therapy Documentation Precautions:  Precautions Precautions: Fall Precaution Comments: R hemi, diplopia/visual deficits Restrictions Weight Bearing Restrictions: No    Therapy/Group: Individual Therapy  Beau Fanny, PT, DPT 08/19/2021, 4:11 PM

## 2021-08-19 NOTE — Progress Notes (Signed)
Occupational Therapy Weekly Progress Note  Patient Details  Name: Roy [name] MRN: [id number] Date of Birth: 11-May-1996  Beginning of progress report period: August 06, 2021 End of progress report period: August 20, 2021  Today's Date: 08/20/2021 OT Individual Time: 3545-6256 OT Individual Time Calculation (min): 56 min    Patient has met 2 of 3 short term goals.  Patient is making slow, but steady progress towards OT goals. Pt has demonstrated improved sit<>stands in [place] for transfers, improved attention, and awareness. Patient requires max A for BADL tasks, but he is able to use B UE's more functionally.  Continue current POC.   Patient continues to demonstrate the following deficits: muscle weakness, decreased cardiorespiratoy endurance, impaired timing and sequencing, abnormal tone, unbalanced muscle activation, motor apraxia, ataxia, decreased coordination, and decreased motor planning, decreased visual perceptual skills, decreased midline orientation and decreased motor planning, decreased initiation, decreased attention, decreased awareness, decreased problem solving, decreased safety awareness, decreased memory, and delayed processing, and decreased sitting balance, decreased standing balance, decreased postural control, hemiplegia, and decreased balance strategies and therefore will continue to benefit from skilled OT intervention to enhance overall performance with BADL and Reduce care partner burden.  Patient progressing toward long term goals..  Continue plan of care.  OT Short Term Goals Week 2:  OT Short Term Goal 1 (Week 2): Pt will bring eating etensil to mouth with mod A OT Short Term Goal 1 - Progress (Week 2): Met OT Short Term Goal 2 (Week 2): Patient will complete 1 step of UB dressing task. OT Short Term Goal 2 - Progress (Week 2): Progressing toward goal OT Short Term Goal 3 (Week 2): Patient will complete toilet transfer with max A of 1 and LRAD OT Short Term  Goal 3 - Progress (Week 2): Met Week 3:  OT Short Term Goal 1 (Week 3): Patient will complete 1 step of UB dressing task OT Short Term Goal 2 (Week 3): Pt will stand at the sink with mod A of 1 person in preparation for BADL task OT Short Term Goal 3 (Week 3): Patient will use R UE to grasp toothbrush while applying toothpaste with L hand and overall mod A  Skilled Therapeutic Interventions/Progress Updates:    Pt greeted semi-reclined in bed and agreeable to OT treatment session. Pt completed bed mobility with OT assist to get L hand to bedrail, then able to hook feet and push up into sitting with mod A. Worked on sitting balance at EOB unsupported while participating in UB bathing tasks. OT used NDT techniques to facilitate trunk and pelvic position during functional tasks. Worked on functional use of B UE's within all bathing tasks for neuro re-ed. Sit<>stand at EOB with mod A +2, then trialed a RW in standing, but pt unable to maintain grip functionally with B UE's. Pt was able to tolerate standing for 2 minutes with mod A +2 while OT assisted with washing buttock and donning new brief. Addressed anterior weight shift and sitting balance to try to don pants. Pt still needed max A for this task with similar stand completed as prior. Stand-pivot to wc on R side with mod A this morning. Pt brought to therapy gym and worked on B UE NMR with theraputty activity. Incorporated bimanual task of grasping and trying to pull apart putty. Pt then completed large peg board task with B UE's with pt able to grasp with both hands and cross midline. Pt returned to room and left seated in TIS wc  with nurse tech present and needs met.   Therapy Documentation Precautions:  Precautions Precautions: Fall Precaution Comments: R hemi, diplopia/visual deficits Restrictions Weight Bearing Restrictions: No Pain: Pain Assessment Pain Scale: 0-10 Pain Score: 0-No pain   Therapy/Group: Individual Therapy  Valma Cava 08/19/2021, 9:42 PM

## 2021-08-20 LAB — HEPATIC FUNCTION PANEL
ALT: 127 U/L — ABNORMAL HIGH (ref 0–44)
AST: 56 U/L — ABNORMAL HIGH (ref 15–41)
Albumin: 3.5 g/dL (ref 3.5–5.0)
Alkaline Phosphatase: 81 U/L (ref 38–126)
Bilirubin, Direct: <0.1 mg/dL (ref 0.0–0.2)
Total Bilirubin: 0.3 mg/dL (ref 0.3–1.2)
Total Protein: 7 g/dL (ref 6.5–8.1)

## 2021-08-20 NOTE — Progress Notes (Signed)
Physical Therapy Session Note  Patient Details  Name: Roy [name] MRN: [id number] Date of Birth: 19-Nov-1995  Today's Date: 08/20/2021 PT Individual Time: 5997-8776 PT Individual Time Calculation (min): 54 min   Short Term Goals:  Week 3:  PT Short Term Goal 1 (Week 3): Pt will perform bed mobility consistently with minA. PT Short Term Goal 2 (Week 3): Pt will perform sit to stand consistently with minA. PT Short Term Goal 3 (Week 3): Pt will perform bed to chair consistently with minA. PT Short Term Goal 4 (Week 3): Pt will ambulate x25' with modA and LRAD.  Skilled Therapeutic Interventions/Progress Updates:   Pt received sitting in WC and agreeable to PT. Pt transported to rehab gym in [facility]. Sit<>stand x 8 with min-mod assist. Reciprocal marching in Parallel bars 2 x 5 BLE with mod assist to prevent scissoring on the RLE. Pt performed static standing in parallel bars 3 x 30sec with CGA min assist and visual feedback from mirror. Gait training in parallel bars 3 x 44f with mod-max assist and max cues for sequencing, posture, and step width on the RLE as well as visual feedback from mirror.   Pt performed reciprocal marching in kinetron 5 x 1 min with 60-90sec rest break between bouts with min assist and moderate cues throughout for full ROM on BLE as well as min assist to improve position of the RUE on rail. [facility] and all needs met.       Therapy Documentation Precautions:  Precautions Precautions: Fall Precaution Comments: R hemi, diplopia/visual deficits Restrictions Weight Bearing Restrictions: No  Vital Signs: Therapy Vitals Temp: 99.1 F (37.3 C) Temp Source: Oral Pulse Rate: 98 Resp: 20 BP: 122/79 Oxygen Therapy SpO2: 100 % Pain: denies    Therapy/Group: Individual Therapy  ALorie Phenix12/10/2020, 4:33 PM

## 2021-08-20 NOTE — Progress Notes (Signed)
Speech Language Pathology Weekly Progress and Session Note  Patient Details  Name: Roy [name] MRN: [id number] Date of Birth: 02-25-1996  Beginning of progress report period: August 13, 2021 End of progress report period: August 20, 2021  Today's Date: 08/20/2021 SLP Individual Time: 1400-1430 SLP Individual Time Calculation (min): 30 min  Short Term Goals: Week 2: SLP Short Term Goal 1 (Week 2): Patient will demonstrate sustained attention to functional tasks for ~25 minutes with Mod verbal cues for redirection. SLP Short Term Goal 1 - Progress (Week 2): Met SLP Short Term Goal 2 (Week 2): Patient will self-monitor and correct verbal perseveration at the word level with Max A verbal and visual cues in 25% of opportunities. SLP Short Term Goal 2 - Progress (Week 2): Met SLP Short Term Goal 3 (Week 2): Patient will utilize diaphragmatic breathing at the phrase level with Mod A multimodal cues. SLP Short Term Goal 3 - Progress (Week 2): Met SLP Short Term Goal 4 (Week 2): Patient will utilize speech intelligibility strategies at the phrase level with Max A multimodal cues to achieve ~75% intelligibility. SLP Short Term Goal 4 - Progress (Week 2): Met    New Short Term Goals: Week 3: SLP Short Term Goal 1 (Week 3): Patient will demonstrate sustained attention to functional tasks for ~25 minutes with Min verbal cues for redirection. SLP Short Term Goal 2 (Week 3): Patient will self-monitor and correct verbal perseveration at the phrase level with Max A verbal and visual cues in 50% of opportunities. SLP Short Term Goal 3 (Week 3): Patient will utilize diaphragmatic breathing at the phrase level with Min A multimodal cues. SLP Short Term Goal 4 (Week 3): Patient will utilize speech intelligibility strategies at the phrase level with Mod A multimodal cues to achieve ~75% intelligibility. SLP Short Term Goal 5 (Week 3): Patient will demonstrate functional problem solving for basic and  familiar tasks with Min verbal cues.  Weekly Progress Updates: Patient has made excellent gains and has met 4 of 4 STGs this reporting period. Currently, patient demonstrates improved ability to utilize speech intelligibility strategies at the word and phrase level resulting in improved intelligibility to ~75%. Patient is utilizing over-articulation, easy onset, and diaphragmatic breathing with Mod articulatory, verbal and visual cues. Patient also demonstrates decreased perseveration at the word and phrase level. Multimodal communication was initiated this week for utilization during communication breakdowns, especially with staff. Patient with improved sustained attention this week with focus on functional problem solving this upcoming reporting period. Patient and family education ongoing. Patient would benefit from continued skilled SLP intervention to maximize his speech intelligibility and cognitive functioning prior to discharge.     Intensity: Minumum of 1-2 x/day, 30 to 90 minutes Frequency: 3 to 5 out of 7 days Duration/Length of Stay: 09/03/21 Treatment/Interventions: Cognitive remediation/compensation;Internal/external aids;Speech/Language facilitation;Cueing hierarchy;Environmental controls;Therapeutic Activities;Functional tasks;Patient/family education   Daily Session  Skilled Therapeutic Interventions:   Skilled treatment session focused on communication goals. SLP facilitated session by providing a structured speech task that focused on producing both /k/ and /g/ in initial position at the word level. Patient completed task with 80% accuracy and Min verbal cues needed to self-monitor and correct articulatory errors. Patient left upright in the wheelchair with alarm on and all needs within reach. Continue with current plan of care.      Pain No/Denies Pain   Therapy/Group: Individual Therapy  Keymon Mcelroy 08/20/2021, 6:15 AM

## 2021-08-20 NOTE — Progress Notes (Signed)
Physical Therapy Weekly Progress Note  Patient Details  Name: Roy [name] MRN: [id number] Date of Birth: Jul 27, 1996  Beginning of progress report period: August 13, 2021 End of progress report period: August 20, 2021  Today's Date: 08/20/2021 PT Individual Time: 1102-1200 PT Individual Time Calculation (min): 58 min   Patient has met 3 of 4 short term goals.  Pt is progressing very well toward mobility goals, improving independence with bed mobility, balance, transfers, and ambulation. Pt performing bed mobility consistently at modA level, transfers require between minA and modA +2 depending on motor planning and step-by-step sequencing, and ambulation up to 17' with EVA walker and modA +2. Pt will benefit from family ed prior to DC.  Patient continues to demonstrate the following deficits muscle weakness, decreased cardiorespiratoy endurance, impaired timing and sequencing, abnormal tone, unbalanced muscle activation, motor apraxia, ataxia, decreased coordination, and decreased motor planning, and decreased sitting balance, decreased standing balance, decreased postural control, and decreased balance strategies and therefore will continue to benefit from skilled PT intervention to increase functional independence with mobility.  Patient progressing toward long term goals..  Continue plan of care.  PT Short Term Goals Week 2:  PT Short Term Goal 1 (Week 2): Pt will perform bed mobility consistently with modA PT Short Term Goal 1 - Progress (Week 2): Met PT Short Term Goal 2 (Week 2): Pt will perform sit to stand transfer consistently with minA. PT Short Term Goal 2 - Progress (Week 2): Progressing toward goal PT Short Term Goal 3 (Week 2): Pt will perform bed to chair transfer consistently with modA. PT Short Term Goal 3 - Progress (Week 2): Met PT Short Term Goal 4 (Week 2): Pt will ambulate x25' with modA +2. PT Short Term Goal 4 - Progress (Week 2): Met Week 3:  PT Short Term Goal  1 (Week 3): Pt will perform bed mobility consistently with minA. PT Short Term Goal 2 (Week 3): Pt will perform sit to stand consistently with minA. PT Short Term Goal 3 (Week 3): Pt will perform bed to chair consistently with minA. PT Short Term Goal 4 (Week 3): Pt will ambulate x25' with modA and LRAD.  Skilled Therapeutic Interventions/Progress Updates:     Pt received seated in [facility] and agrees to therapy. No complaint of pain. WC transport to gym for time management. Pt performs multiple reps of sit to stand during session with EVA walker and minA to modA +2, depending on sequencing and motor planning. Session focused on gait training with EVA walker. Pt ambulates bouts of 30', 30', 45', and 45'. Extended seated rest breaks between each bout. ModA +2 required with PT facilitating trunk and hip stability with facilitation of lateral weight shifting, and rehab tech assisting to control propulsion of EVA walker. Multimodal cues for trunk and hip extension, decreasing stride length for more controlled steps, maintaining adequate BOS due to tendency for scissoring gait pattern. WC transport back to room. Left seated with all needs within reach.  Therapy Documentation Precautions:  Precautions Precautions: Fall Precaution Comments: R hemi, diplopia/visual deficits Restrictions Weight Bearing Restrictions: No   Therapy/Group: Individual Therapy  Breck Coons, PT, DPT 08/20/2021, 3:36 PM

## 2021-08-20 NOTE — Progress Notes (Signed)
PROGRESS NOTE   Subjective/Complaints: Pt doing well. Had a good day for birthday yesterday. Slept well.   ROS: Patient denies fever, rash, sore throat, blurred vision, nausea, vomiting, diarrhea, cough, shortness of breath or chest pain, headache, or mood change.   Objective:   No results found. No results for input(s): WBC, HGB, HCT, PLT in the last 72 hours.  Recent Labs    08/19/21 0505  CREATININE 1.03     Intake/Output Summary (Last 24 hours) at 08/20/2021 0915 Last data filed at 08/20/2021 0021 Gross per 24 hour  Intake 640 ml  Output 200 ml  Net 440 ml        Physical Exam: Vital Signs Blood pressure 103/69, pulse 76, temperature 98.2 F (36.8 C), temperature source Oral, resp. rate 17, height 5\' 2"  (1.575 m), weight 98 kg, SpO2 100 %. Constitutional: No distress . Vital signs reviewed. HEENT: NCAT, EOMI, oral membranes moist Neck: supple Cardiovascular: RRR without murmur. No JVD    Respiratory/Chest: CTA Bilaterally without wheezes or rales. Normal effort    GI/Abdomen: BS +, non-tender, non-distended Ext: no clubbing, cyanosis, or edema Psych: pleasant and cooperative  Skin: intact Musc: No edema in extremities.  No tenderness in extremities. Neuro: Alert, partial INO Left pupil --stable appearance Severe dysarthria remains, voice out of sync with breathing. Motor: RUE 5/5, RLE 4-5/5 LUE and LLE 3+ to 4/5--stable, with 1/2 sensation present--no changes. DTR's 1+. Ataxia L>R UE is ongoing. Saw no intentional or resting tremors on exam today.       Assessment/Plan: 1. Functional deficits which require 3+ hours per day of interdisciplinary therapy in a comprehensive inpatient rehab setting. Physiatrist is providing close team supervision and 24 hour management of active medical problems listed below. Physiatrist and rehab team continue to assess barriers to discharge/monitor patient progress toward  functional and medical goals  Care Tool:  Bathing    Body parts bathed by patient: Chest, Abdomen, Right arm, Right upper leg, Left upper leg, Face   Body parts bathed by helper: Front perineal area, Buttocks, Left lower leg, Right lower leg, Left arm     Bathing assist Assist Level: Maximal Assistance - Patient 24 - 49%     Upper Body Dressing/Undressing Upper body dressing   What is the patient wearing?: Pull over shirt    Upper body assist Assist Level: Maximal Assistance - Patient 25 - 49%    Lower Body Dressing/Undressing Lower body dressing      What is the patient wearing?: Incontinence brief, Pants     Lower body assist Assist for lower body dressing: Total Assistance - Patient < 25%     Toileting Toileting    Toileting assist Assist for toileting: Total Assistance - Patient < 25%     Transfers Chair/bed transfer  Transfers assist  Chair/bed transfer activity did not occur: Safety/medical concerns  Chair/bed transfer assist level: Maximal Assistance - Patient 25 - 49% (stand pivot)     Locomotion Ambulation   Ambulation assist   Ambulation activity did not occur: Safety/medical concerns  Assist level: 2 helpers Assistive device: Hand held assist Max distance: 5   Walk 10 feet activity   Assist  Assist level: 2 helpers (+3 WC follow) Assistive device: Hand held assist   Walk 50 feet activity   Assist Walk 50 feet with 2 turns activity did not occur: Safety/medical concerns         Walk 150 feet activity   Assist Walk 150 feet activity did not occur: Safety/medical concerns         Walk 10 feet on uneven surface  activity   Assist Walk 10 feet on uneven surfaces activity did not occur: Safety/medical concerns         Wheelchair     Assist Is the patient using a wheelchair?: No             Wheelchair 50 feet with 2 turns activity    Assist            Wheelchair 150 feet activity      Assist          Blood pressure 103/69, pulse 76, temperature 98.2 F (36.8 C), temperature source Oral, resp. rate 17, height 5\' 2"  (1.575 m), weight 98 kg, SpO2 100 %.  Medical Problem List and Plan: 1. 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    2.  Impaired mobility: -DVT/anticoagulation:  Pharmaceutical: Continue Lovenox. Monitoring creatinine on Thursdays             -antiplatelet therapy: Aspirin 81 mg daily and Brilinta 90 mg twice daily 3. Pain Management: Tylenol as needed 4. Depression/insomnia/agitation: Continue Zoloft 100 mg daily, melatonin 3 mg nightly             -antipsychotic agents: Seroquel 50 mg nightly for agitation  -mood remains positive 5. Neuropsych: This patient is not fully capable of making decisions on his own behalf. 6. Skin/Wound Care: Routine skin checks 7. Fluids/Electrolytes/Nutrition: encourage PO  -hyponatremia 136 11/21 8.  Hyperlipidemia.  Continue Lipitor 9.  History of alcohol and marijuana use.  Urine drug screen positive marijuana.  Counseling 10. Morbid Obesity- BMI 39.52- counseling and diet education 11. Elevated LFT's  12/1 hep panel negative, HIV neg  12/2 f/u LFT's finally show improvement. F/u again prior to discharge for resolution.. 12. Mild leukocytosis:    Afebrile  -no s/s infection  -improved   11/21--recheck Monday   LOS: 15 days A FACE TO FACE EVALUATION WAS PERFORMED  Saturday 08/20/2021, 9:15 AM

## 2021-08-21 DIAGNOSIS — E871 Hypo-osmolality and hyponatremia: Secondary | ICD-10-CM

## 2021-08-21 DIAGNOSIS — G441 Vascular headache, not elsewhere classified: Secondary | ICD-10-CM

## 2021-08-21 LAB — GLUCOSE, CAPILLARY: Glucose-Capillary: 116 mg/dL — ABNORMAL HIGH (ref 70–99)

## 2021-08-21 NOTE — Progress Notes (Signed)
PROGRESS NOTE   Subjective/Complaints: Patient seen laying in bed this AM.  He indicates he slept well overnight.  Overnight called by nursing regarding headaches.  Per nursing, patient did not want to notify provider due to low intensity, however family adamant provider be notified.  Appear to be related to patient being frustrated with family after being woken up out of sleep.  Encouraged rest, fluids, Tylenol as needed.  This AM, patient denies headaches and attempts to state that they ?  Happen once a day.  ROS: Denies CP, SOB, N/V/D  Objective:   No results found. No results for input(s): WBC, HGB, HCT, PLT in the last 72 hours.  Recent Labs    08/19/21 0505  CREATININE 1.03      Intake/Output Summary (Last 24 hours) at 08/21/2021 1334 Last data filed at 08/21/2021 1316 Gross per 24 hour  Intake 920 ml  Output 300 ml  Net 620 ml         Physical Exam: Vital Signs Blood pressure 117/87, pulse 99, temperature 98.5 F (36.9 C), resp. rate 16, height 5\' 2"  (1.575 m), weight 98 kg, SpO2 100 %. Constitutional: No distress . Vital signs reviewed. HENT: Normocephalic.  Atraumatic. Eyes: Limited extraocular muscle. No discharge. Cardiovascular: No JVD.  RRR. Respiratory: Normal effort.  No stridor.  Bilateral clear to auscultation. GI: Non-distended.  BS +. Skin: Warm and dry.  Intact. Psych: Normal mood.  Normal behavior. Musc: No edema in extremities.  No tenderness in extremities. Neuro: Alert Severe dysarthria remains, unchanged  Motor: RUE 5/5, RLE 4-5/5 LUE and LLE 3+ to 4/5--some improvement  Assessment/Plan: 1. Functional deficits which require 3+ hours per day of interdisciplinary therapy in a comprehensive inpatient rehab setting. Physiatrist is providing close team supervision and 24 hour management of active medical problems listed below. Physiatrist and rehab team continue to assess barriers to  discharge/monitor patient progress toward functional and medical goals  Care Tool:  Bathing    Body parts bathed by patient: Chest, Abdomen, Right arm, Right upper leg, Left upper leg, Face   Body parts bathed by helper: Front perineal area, Buttocks, Left lower leg, Right lower leg, Left arm     Bathing assist Assist Level: Maximal Assistance - Patient 24 - 49%     Upper Body Dressing/Undressing Upper body dressing   What is the patient wearing?: Pull over shirt    Upper body assist Assist Level: Maximal Assistance - Patient 25 - 49%    Lower Body Dressing/Undressing Lower body dressing      What is the patient wearing?: Incontinence brief, Pants     Lower body assist Assist for lower body dressing: Total Assistance - Patient < 25%     Toileting Toileting    Toileting assist Assist for toileting: Total Assistance - Patient < 25%     Transfers Chair/bed transfer  Transfers assist  Chair/bed transfer activity did not occur: Safety/medical concerns  Chair/bed transfer assist level: Maximal Assistance - Patient 25 - 49% (stand pivot)     Locomotion Ambulation   Ambulation assist   Ambulation activity did not occur: Safety/medical concerns  Assist level: 2 helpers Assistive device: Hand held assist  Max distance: 5   Walk 10 feet activity   Assist     Assist level: 2 helpers (+3 WC follow) Assistive device: Hand held assist   Walk 50 feet activity   Assist Walk 50 feet with 2 turns activity did not occur: Safety/medical concerns         Walk 150 feet activity   Assist Walk 150 feet activity did not occur: Safety/medical concerns         Walk 10 feet on uneven surface  activity   Assist Walk 10 feet on uneven surfaces activity did not occur: Safety/medical concerns         Wheelchair     Assist Is the patient using a wheelchair?: No             Wheelchair 50 feet with 2 turns activity    Assist             Wheelchair 150 feet activity     Assist          Blood pressure 117/87, pulse 99, temperature 98.5 F (36.9 C), resp. rate 16, height 5\' 2"  (1.575 m), weight 98 kg, SpO2 100 %.  Medical Problem List and Plan: 1. 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  Impaired mobility: -DVT/anticoagulation:  Pharmaceutical: Continue Lovenox. Monitoring creatinine on Thursdays             -antiplatelet therapy: Aspirin 81 mg daily and Brilinta 90 mg twice daily 3. Pain Management:   Tylenol as needed for vascular headache 4. Depression/insomnia/agitation: Continue Zoloft 100 mg daily, melatonin 3 mg nightly             -antipsychotic agents: Seroquel 50 mg nightly for agitation  -mood remains positive 5. Neuropsych: This patient is not fully capable of making decisions on his own behalf. 6. Skin/Wound Care: Routine skin checks 7. Fluids/Electrolytes/Nutrition: encourage PO  -hyponatremia 136 11/21  Labs ordered for Monday to follow sodium as well as creatinine 8.  Hyperlipidemia.  Continue Lipitor 9.  History of alcohol and marijuana use.  Urine drug screen positive marijuana.  Counseling 10. Morbid Obesity- BMI 39.52- counseling and diet education 11.  Transaminitis  12/1 hep panel negative, HIV neg  LFTs elevated, but improving on 12/2 12. Mild leukocytosis: Resolved  Afebrile  -no s/s infection   LOS: 16 days A FACE TO FACE EVALUATION WAS PERFORMED  Deby Adger 14/2 08/21/2021, 1:34 PM

## 2021-08-21 NOTE — Progress Notes (Signed)
Physical Therapy Session Note  Patient Details  Name: Roy [name] MRN: [id number] Date of Birth: Dec 10, 1995  Today's Date: 08/21/2021 PT Individual Time: 0900-1000 PT Individual Time Calculation (min): 60 min   Short Term Goals: Week 3:  PT Short Term Goal 1 (Week 3): Pt will perform bed mobility consistently with minA. PT Short Term Goal 2 (Week 3): Pt will perform sit to stand consistently with minA. PT Short Term Goal 3 (Week 3): Pt will perform bed to chair consistently with minA. PT Short Term Goal 4 (Week 3): Pt will ambulate x25' with modA and LRAD.  Skilled Therapeutic Interventions/Progress Updates:     Retrieved [place] TIS w/c in efforts to progress w/c mobility and increase functional indep. Will need seat width adjusted to fit - leg rests were adjusted to fit. Pt supine in bed to start session - agreeable to therapy with no reports of pain. Assisted in donning pants, requiring maxA +2 with bridging method - pt able to fully bridge to clear hips to assist in pulling pants up. Donned socks and shoes at [place] level for time as well. Supine<>sit with HOB flat and use of bed rail, required minA for rolling and +2 minA for sidelying to sitting. Doffed dirty shirt with supervision and cues only - able to don clean t-shirt with modA. Applied deodorant to underarms with hand-over-hand assist due to ataxia and poor grip control. Completed stand<>pivot transfer with no AD with +2 min/modA from EOB to [facility] w/c. For time, pt transported to main rehab gym. Worked on w/c propulsion on level tile with [place] chair - required totalA (Pt <25%) for propulsion using BLE's in both forward and backward direction, ~73f each way. Ataxia and motor planning deficits impacting. Will benefit from further ed and training. Worked on gait training with EVA walker where he was able to stand from w/c with minA and then required +2 modA for ambulating 477f Primary gait deficits include scissoring, ataxia,  forward trunk lean, and narrow BOS. PT facilitating lateral weight shift and widening BOS with VC for other gait deficits. Pt assisted back to w/c with stand<>pivot modA transfer. Returned to his room where he requested to remain seated in [place] as he likes this one better. Girlfriend at bedside. Safety belt alarm on and soft call bell within reach. All needs met.   Therapy Documentation Precautions:  Precautions Precautions: Fall Precaution Comments: R hemi, diplopia/visual deficits Restrictions Weight Bearing Restrictions: No General:    Therapy/Group: Individual Therapy  Shalunda Lindh P Lauralye Kinn PT 08/21/2021, 7:35 AM

## 2021-08-21 NOTE — Progress Notes (Signed)
Occupational Therapy Session Note  Patient Details  Name: Roy [name] MRN: [id number] Date of Birth: 05-Jul-1996  Today's Date: 08/22/2021 OT Individual Time: 1301-1400 OT Individual Time Calculation (min): 59 min   Skilled Therapeutic Interventions/Progress Updates:    Pt greeted in bed with no c/o pain. Girlfriend present, sleeping in the recliner but awaking to assist therapist with locating pts personal ADL items. Min A for supine<sit and then pt engaged in BADLs EOB at sit<stand level using Stedy for standing support as needed. Pts Lt alien arm almost grabbed/crushed the basin x2 so OT had to place soapy basin of water in the sink and hand each wash cloth to pt. Pt essentially completed bathing tasks x2 in order to wash with personal soap and then rinse soap off of body. Pt required cues for visually attending to the Lt arm due to dropping wash cloths and deodorant. Min hand over hand guidance to fully wash his Lt underarm using Rt hand, cues to bend Lt elbow "make a chicken wing" so that his Lt arm wouldn't rise and knock over items on his table. Mod A overall to wash LB, Max A for dressing, pt unable to self organize with handed shirt or pants due to apraxia, needed cues and assistance. He completed UB dressing while semi perched on Stedy to challenge trunk control. He required CGA for balance overall, CGA for sit<stands in [place] today, even from his [place] w/c! Vcs to extend hips once in standing. He transferred to the w/c via Stedy at close of session. He was tilted back for safety, left pt with soft call bell pinned to his shirt and safety belt fastened. Tx focus placed on adaptive self care skills, NMR, sit<stands, dynamic balance, and trunk control.   Therapy Documentation Precautions:  Precautions Precautions: Fall Precaution Comments: R hemi, diplopia/visual deficits Restrictions Weight Bearing Restrictions: No  ADL: ADL Eating: Dependent Grooming: Dependent Upper Body  Bathing: Dependent Lower Body Bathing: Dependent Upper Body Dressing: Dependent Lower Body Dressing: Dependent Toileting: Dependent Toilet Transfer: Maximal assistance (Stedy)   Therapy/Group: Individual Therapy  Bryella Diviney A Charlaine Utsey 08/22/2021, 4:40 PM

## 2021-08-21 NOTE — Progress Notes (Addendum)
Patient was very upset and uncontrollable crying and complaining of a headache. Family at bedside stated that the surgeon advised them that if he ever got a headache that he should receive a "work up" because it could be another bleed leading to a stroke. MD Allena Katz made aware. Allena Katz advised [place] to continue to monitor patient and increase oral fluids. Vital signs within normal limits. Patient and family member requesting an increase of melatonin and seroquel. Patient and family member was heard arguing and screaming with eachother an hour before. This nurse heard family at bedside state "she will throat patient". No harm was done to patient they are sleeping at this time. No other concerns to report.

## 2021-08-22 DIAGNOSIS — K5901 Slow transit constipation: Secondary | ICD-10-CM

## 2021-08-22 DIAGNOSIS — G479 Sleep disorder, unspecified: Secondary | ICD-10-CM

## 2021-08-22 LAB — GLUCOSE, CAPILLARY: Glucose-Capillary: 94 mg/dL (ref 70–99)

## 2021-08-22 MED ORDER — POLYETHYLENE GLYCOL 3350 17 G PO PACK
17.0000 g | PACK | Freq: Two times a day (BID) | ORAL | Status: DC
  Administered 2021-08-22 – 2021-09-09 (×29): 17 g via ORAL
  Filled 2021-08-22 (×36): qty 1

## 2021-08-22 MED ORDER — MELATONIN 5 MG PO TABS
5.0000 mg | ORAL_TABLET | Freq: Every day | ORAL | Status: DC
  Administered 2021-08-22 – 2021-09-09 (×19): 5 mg via ORAL
  Filled 2021-08-22 (×19): qty 1

## 2021-08-22 NOTE — Progress Notes (Signed)
PROGRESS NOTE   Subjective/Complaints: Patient seen laying in bed this morning.  States he did not sleep well overnight because he has difficulty falling asleep.  Family states it is because he keeps being woken up by staff.  Appears to be discrepancy between patient and family's account, in general.  Family report constipation.  ROS: Denies CP, SOB, N/V/D  Objective:   No results found. No results for input(s): WBC, HGB, HCT, PLT in the last 72 hours.  No results for input(s): NA, K, CL, CO2, GLUCOSE, BUN, CREATININE, CALCIUM in the last 72 hours.    Intake/Output Summary (Last 24 hours) at 08/22/2021 1624 Last data filed at 08/22/2021 1549 Gross per 24 hour  Intake 180 ml  Output 525 ml  Net -345 ml         Physical Exam: Vital Signs Blood pressure (!) 107/94, pulse 84, temperature 98.2 F (36.8 C), resp. rate 16, height 5\' 2"  (1.575 m), weight 98 kg, SpO2 100 %. Constitutional: No distress . Vital signs reviewed. HENT: Normocephalic.  Atraumatic. Eyes: EOMI. No discharge. Cardiovascular: No JVD.  RRR. Respiratory: Normal effort.  No stridor.  Bilateral clear to auscultation. GI: Non-distended.  BS +. Skin: Warm and dry.  Intact. Psych: Normal mood.  Normal behavior. Musc: No edema in extremities.  No tenderness in extremities. Neuro: Alert Severe dysarthria remains, stable/improving-increase intelligibility Motor: RUE 5/5, RLE 4-5/5 LUE and LLE 3+ to 4/5--some improvement  Assessment/Plan: 1. Functional deficits which require 3+ hours per day of interdisciplinary therapy in a comprehensive inpatient rehab setting. Physiatrist is providing close team supervision and 24 hour management of active medical problems listed below. Physiatrist and rehab team continue to assess barriers to discharge/monitor patient progress toward functional and medical goals  Care Tool:  Bathing    Body parts bathed by patient:  Chest, Abdomen, Right arm, Right upper leg, Left upper leg, Face, Front perineal area   Body parts bathed by helper: Buttocks, Right lower leg, Left lower leg     Bathing assist Assist Level: Moderate Assistance - Patient 50 - 74%     Upper Body Dressing/Undressing Upper body dressing   What is the patient wearing?: Pull over shirt    Upper body assist Assist Level: Maximal Assistance - Patient 25 - 49%    Lower Body Dressing/Undressing Lower body dressing      What is the patient wearing?: Incontinence brief, Pants     Lower body assist Assist for lower body dressing: Maximal Assistance - Patient 25 - 49%     Toileting Toileting    Toileting assist Assist for toileting: Total Assistance - Patient < 25%     Transfers Chair/bed transfer  Transfers assist  Chair/bed transfer activity did not occur: Safety/medical concerns  Chair/bed transfer assist level: Maximal Assistance - Patient 25 - 49% (stand pivot)     Locomotion Ambulation   Ambulation assist   Ambulation activity did not occur: Safety/medical concerns  Assist level: 2 helpers Assistive device: Hand held assist Max distance: 5   Walk 10 feet activity   Assist     Assist level: 2 helpers (+3 WC follow) Assistive device: Hand held assist   Walk  50 feet activity   Assist Walk 50 feet with 2 turns activity did not occur: Safety/medical concerns         Walk 150 feet activity   Assist Walk 150 feet activity did not occur: Safety/medical concerns         Walk 10 feet on uneven surface  activity   Assist Walk 10 feet on uneven surfaces activity did not occur: Safety/medical concerns         Wheelchair     Assist Is the patient using a wheelchair?: No             Wheelchair 50 feet with 2 turns activity    Assist            Wheelchair 150 feet activity     Assist          Blood pressure (!) 107/94, pulse 84, temperature 98.2 F (36.8 C), resp.  rate 16, height 5\' 2"  (1.575 m), weight 98 kg, SpO2 100 %.  Medical Problem List and Plan: 1. 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  Impaired mobility: -DVT/anticoagulation:  Pharmaceutical: Continue Lovenox. Monitoring creatinine on Thursdays             -antiplatelet therapy: Aspirin 81 mg daily and Brilinta 90 mg twice daily 3. Pain Management:   Tylenol as needed for vascular headache 4. Depression/insomnia/agitation: Continue Zoloft 100 mg daily, melatonin 3 mg nightly             -antipsychotic agents: Seroquel 50 mg nightly for agitation  -mood remains positive  Appears to be improved 5. Neuropsych: This patient is not fully capable of making decisions on his own behalf. 6. Skin/Wound Care: Routine skin checks 7. Fluids/Electrolytes/Nutrition: encourage PO  -hyponatremia 136 11/21  Labs ordered for tomorrow to follow sodium as well as creatinine 8.  Hyperlipidemia.  Continue Lipitor 9.  History of alcohol and marijuana use.  Urine drug screen positive marijuana.  Counseling 10. Morbid Obesity- BMI 39.52- counseling and diet education 11.  Transaminitis  12/1 hep panel negative, HIV neg  LFTs elevated, but improving on 12/2 12. Mild leukocytosis: Resolved  Afebrile  -no s/s infection 13.  Sleep disturbance  Meds increased on 12/4 14.  Slow transit constipation  Bowel meds increased on 12/4   LOS: 17 days A FACE TO FACE EVALUATION WAS PERFORMED  Orlandus Borowski 14/4 08/22/2021, 4:24 PM

## 2021-08-23 LAB — BASIC METABOLIC PANEL
Anion gap: 8 (ref 5–15)
BUN: 8 mg/dL (ref 6–20)
CO2: 26 mmol/L (ref 22–32)
Calcium: 9 mg/dL (ref 8.9–10.3)
Chloride: 102 mmol/L (ref 98–111)
Creatinine, Ser: 0.96 mg/dL (ref 0.61–1.24)
GFR, Estimated: >60 mL/min (ref >60)
Glucose, Bld: 105 mg/dL — ABNORMAL HIGH (ref 70–99)
Potassium: 3.5 mmol/L (ref 3.5–5.1)
Sodium: 136 mmol/L (ref 135–145)

## 2021-08-23 LAB — CBC
HCT: 36.7 % — ABNORMAL LOW (ref 39.0–52.0)
Hemoglobin: 12.4 g/dL — ABNORMAL LOW (ref 13.0–17.0)
MCH: 30.7 pg (ref 26.0–34.0)
MCHC: 33.8 g/dL (ref 30.0–36.0)
MCV: 90.8 fL (ref 80.0–100.0)
Platelets: 379 10*3/uL (ref 150–400)
RBC: 4.04 MIL/uL — ABNORMAL LOW (ref 4.22–5.81)
RDW: 13.4 % (ref 11.5–15.5)
WBC: 9.2 10*3/uL (ref 4.0–10.5)
nRBC: 0 % (ref 0.0–0.2)

## 2021-08-23 NOTE — Progress Notes (Signed)
Patient checked and sleeping with bed alarm on. No urine output noted. Girlfriend at bedside awake and stated that patient is young and normally voids when he wakes up and  does not want to bother him.  This RN explained to her about our bladder protocol/MD order and the risk of urine infection. She upset but woke him up, RN offered the urinal but pt refused to void. Patient refused bladder scan and in and out cath as well. Explained the risk of urinary infection but patient insisted/stated "sleep". Reminded them to call when he voids. Charge RN Lurena Joiner) informed.

## 2021-08-23 NOTE — Progress Notes (Deleted)
Occupational Therapy Note  Patient Details  Name: Roy [name] MRN: [id number] Date of Birth: 07-18-1996  Today's Date: 08/23/2021 OT Missed Time: 60 Minutes Missed Time Reason: Patient fatigue  Patient greeted semi-reclined in bed asleep with girlfriend asleep in recliner. Per girlfriend and pt, pt very tired and was up all night using the bathroom. Pt and girlfriend request to rest this morning. OT to follow up later this afternoon as time allows.   Merlene Laughter Banita Lehn 08/23/2021, 8:13 AM

## 2021-08-23 NOTE — Progress Notes (Signed)
Speech Language Pathology Daily Session Note  Patient Details  Name: Roy [name] MRN: [id number] Date of Birth: [birth year]  Today's Date: 08/23/2021 SLP Individual Time: 1430-1500 SLP Individual Time Calculation (min): 30 min  Short Term Goals: Week 3: SLP Short Term Goal 1 (Week 3): Patient will demonstrate sustained attention to functional tasks for ~25 minutes with Min verbal cues for redirection. SLP Short Term Goal 2 (Week 3): Patient will self-monitor and correct verbal perseveration at the phrase level with Max A verbal and visual cues in 50% of opportunities. SLP Short Term Goal 3 (Week 3): Patient will utilize diaphragmatic breathing at the phrase level with Min A multimodal cues. SLP Short Term Goal 4 (Week 3): Patient will utilize speech intelligibility strategies at the phrase level with Mod A multimodal cues to achieve ~75% intelligibility. SLP Short Term Goal 5 (Week 3): Patient will demonstrate functional problem solving for basic and familiar tasks with Min verbal cues.  Skilled Therapeutic Interventions: Skilled treatment session focused on communication goals. SLP facilitated session by providing a structured speech task that focused on producing both /p/ and /d/ in initial position at the word level. Patient completed task with 80% accuracy and Min verbal cues to self-monitor and correct articulatory errors. Patient left upright in the wheelchair with alarm on and all needs within reach. Continue with current plan of care.                Pain No/Denies Pain   Therapy/Group: Individual Therapy  Damascus Feldpausch 08/23/2021, 3:12 PM

## 2021-08-23 NOTE — Progress Notes (Signed)
Physical Therapy Session Note  Patient Details  Name: Roy [name] MRN: [id number] Date of Birth: January 14, 1996  Today's Date: 08/23/2021 PT Individual Time: 2010-0712 PT Individual Time Calculation (min): 58 min   Short Term Goals: Week 3:  PT Short Term Goal 1 (Week 3): Pt will perform bed mobility consistently with minA. PT Short Term Goal 2 (Week 3): Pt will perform sit to stand consistently with minA. PT Short Term Goal 3 (Week 3): Pt will perform bed to chair consistently with minA. PT Short Term Goal 4 (Week 3): Pt will ambulate x25' with modA and LRAD.  Skilled Therapeutic Interventions/Progress Updates:     Pt received supine in bed and agrees to therapy. No complaint of pain. Pt performs supine to sit from flat bed with minA/modA cues at trunk and verbal cues for sequencing and positioning. Stand step transfer to [facility] with modA and multimodal cues for posture, lateral weight shifting, and positioning. WC transport to gym for time management.  Pt performs forward, "crawling" transfer onto mat table with modA +2, and is positioned in quadruped. Pt in quadruped to provide WB through bilateral upper extremities, working on core strength and balance. Pt requires minA +1 to maintain position, with tactile cues to perform scapular protraction to activate serratus anterior. Pt performs lateral weight shifting with verbal and tactile cues for sequencing. Rest break taken in prone. Pt then challenged to return to quadruped from prone, with emphasis on coordination and sequencing. Pt is able to complete >50% of transfer without physical assistance but is unable to coordinate motor planning required to position both knees under COG. PT utilizes bed sheet to provide tactile cueing and manual assist for pt to return to quadruped. Weight added to pt's upper back to increase challenge and pt is able to remain in quadruped for 1-2 minutes prior ot requiring rest break.  Pt performs prone to supine roll  with verbal cues for body mechanics and sequencing. Supine to sit with modA. Pt performs multiple reps of sit to stand from mat with minA +1 to modA +1, depending on adequate anterior weight shift. In standing, mirror provided for visual feedback. Pt tasked with maintaining balance with as little assistance as possible. Pt able to maintain static standing balance for ~5 seconds at a time. Pt has significant anterior-posterior sway and generally requires modA for stability. Multimodal cues for posture, including posterior pelvic tilt and trunk extension.  Pt performs stand step transfer back to [facility] with mod A+2 and difficulty with sequencing and balance, possibly related to fatigue. Pt left seated with alarm intact and all needs within reach.  Therapy Documentation Precautions:  Precautions Precautions: Fall Precaution Comments: R hemi, diplopia/visual deficits Restrictions Weight Bearing Restrictions: No   Therapy/Group: Individual Therapy  Beau Fanny, PT, DPT 08/23/2021, 12:24 PM

## 2021-08-23 NOTE — Progress Notes (Signed)
PROGRESS NOTE   Subjective/Complaints: Didn't sleep well again. Says he was woken up by staff. Nurse told me he was refusing I/O caths (hadn't voided for >8 hours). Suspect this is what general issues was. Otherwise he's doing ok  ROS: Limited due to cognitive/behavioral   Objective:   No results found. Recent Labs    08/23/21 0545  WBC 9.2  HGB 12.4*  HCT 36.7*  PLT 379    Recent Labs    08/23/21 0545  NA 136  K 3.5  CL 102  CO2 26  GLUCOSE 105*  BUN 8  CREATININE 0.96  CALCIUM 9.0     Intake/Output Summary (Last 24 hours) at 08/23/2021 1029 Last data filed at 08/23/2021 0723 Gross per 24 hour  Intake 120 ml  Output 200 ml  Net -80 ml        Physical Exam: Vital Signs Blood pressure 107/68, pulse 81, temperature 98.5 F (36.9 C), resp. rate 14, height 5\' 2"  (1.575 m), weight 98 kg, SpO2 99 %. Constitutional: No distress . Vital signs reviewed. HEENT: NCAT, EOMI, oral membranes moist Neck: supple Cardiovascular: RRR without murmur. No JVD    Respiratory/Chest: CTA Bilaterally without wheezes or rales. Normal effort    GI/Abdomen: BS +, non-tender, non-distended Ext: no clubbing, cyanosis, or edema Psych: pleasant and cooperative  Skin: Warm and dry.  Intact.. Musc: No edema in extremities.  No tenderness in extremities. Neuro: Alert Ongoing dysarthria makes it difficult to communicate at times. Motor: RUE 5/5, RLE 4-5/5 LUE and LLE 3+ to 4/5--some improvement in limb ataxia  Assessment/Plan: 1. Functional deficits which require 3+ hours per day of interdisciplinary therapy in a comprehensive inpatient rehab setting. Physiatrist is providing close team supervision and 24 hour management of active medical problems listed below. Physiatrist and rehab team continue to assess barriers to discharge/monitor patient progress toward functional and medical goals  Care Tool:  Bathing    Body parts  bathed by patient: Chest, Abdomen, Right arm, Right upper leg, Left upper leg, Face, Front perineal area   Body parts bathed by helper: Buttocks, Right lower leg, Left lower leg     Bathing assist Assist Level: Moderate Assistance - Patient 50 - 74%     Upper Body Dressing/Undressing Upper body dressing   What is the patient wearing?: Pull over shirt    Upper body assist Assist Level: Maximal Assistance - Patient 25 - 49%    Lower Body Dressing/Undressing Lower body dressing      What is the patient wearing?: Incontinence brief, Pants     Lower body assist Assist for lower body dressing: Maximal Assistance - Patient 25 - 49%     Toileting Toileting    Toileting assist Assist for toileting: Total Assistance - Patient < 25%     Transfers Chair/bed transfer  Transfers assist  Chair/bed transfer activity did not occur: Safety/medical concerns  Chair/bed transfer assist level: Maximal Assistance - Patient 25 - 49% (stand pivot)     Locomotion Ambulation   Ambulation assist   Ambulation activity did not occur: Safety/medical concerns  Assist level: 2 helpers Assistive device: Hand held assist Max distance: 5   Walk 10  feet activity   Assist     Assist level: 2 helpers (+3 WC follow) Assistive device: Hand held assist   Walk 50 feet activity   Assist Walk 50 feet with 2 turns activity did not occur: Safety/medical concerns         Walk 150 feet activity   Assist Walk 150 feet activity did not occur: Safety/medical concerns         Walk 10 feet on uneven surface  activity   Assist Walk 10 feet on uneven surfaces activity did not occur: Safety/medical concerns         Wheelchair     Assist Is the patient using a wheelchair?: No             Wheelchair 50 feet with 2 turns activity    Assist            Wheelchair 150 feet activity     Assist          Blood pressure 107/68, pulse 81, temperature 98.5 F  (36.9 C), resp. rate 14, height 5\' 2"  (1.575 m), weight 98 kg, SpO2 99 %.  Medical Problem List and Plan: 1. 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  Impaired mobility: -DVT/anticoagulation:  Pharmaceutical: Continue Lovenox. Monitoring creatinine on Thursdays             -antiplatelet therapy: Aspirin 81 mg daily and Brilinta 90 mg twice daily 3. Pain Management:   Tylenol as needed for vascular headache 4. Depression/insomnia/agitation: Continue Zoloft 100 mg daily, melatonin 3 mg nightly             -antipsychotic agents: Seroquel 50 mg nightly for agitation  -mood remains positive    5. Neuropsych: This patient is not fully capable of making decisions on his own behalf. 6. Skin/Wound Care: Routine skin checks 7. Fluids/Electrolytes/Nutrition: encourage PO  -hyponatremia 136 11/21 and again 12/5  I personally reviewed the patient's labs today.  WNL 8.  Hyperlipidemia.  Continue Lipitor 9.  History of alcohol and marijuana use.  Urine drug screen positive marijuana.  Counseling 10. Morbid Obesity- BMI 39.52- counseling and diet education 11.  Transaminitis  12/1 hep panel negative, HIV neg  LFTs still elevated, but improvedon 12/2 12. Mild leukocytosis: Resolved  Afebrile  -no s/s infection 14.  Slow transit constipation  Bowel meds increased on 12/4--->moved bowels   LOS: 18 days A FACE TO FACE EVALUATION WAS PERFORMED  14/2 08/23/2021, 10:29 AM

## 2021-08-23 NOTE — Progress Notes (Signed)
Occupational Therapy Session Note  Patient Details  Name: Roy [name] MRN: [id number] Date of Birth: 1996/09/04  Today's Date: 08/23/2021 OT Individual Time: 8184-0375 OT Individual Time Calculation (min): 87 min  and Today's Date: 08/23/2021 OT Missed Time: 30 Minutes Missed Time Reason: Patient fatigue   Short Term Goals: Week 2:  OT Short Term Goal 1 (Week 2): Pt will bring eating etensil to mouth with mod A OT Short Term Goal 1 - Progress (Week 2): Met OT Short Term Goal 2 (Week 2): Patient will complete 1 step of UB dressing task. OT Short Term Goal 2 - Progress (Week 2): Progressing toward goal OT Short Term Goal 3 (Week 2): Patient will complete toilet transfer with max A of 1 and LRAD OT Short Term Goal 3 - Progress (Week 2): Met  Skilled Therapeutic Interventions/Progress Updates:      Patient greeted semi-reclined in bed asleep with girlfriend asleep in recliner. Per girlfriend and pt, pt very tired and was up all night using the bathroom. Pt and girlfriend request to rest this morning.  OT returned in the afternoon and was able to make up 30 minutes with patient. Patient still missed 30 minutes of treatment time this morning.  Patient up seated in wc upon OT arrival and requesting to shower. Stedy used for shower transfer with only min A and verbal cues for full hip and trunk extension into standing. Bathing completed using B UE's for neuro re-ed. Pt able to get R arm up under L to wash armpit today! L UE function continues to be ataxic and apraxic, but more controlled. Blocked practice for dressing techinques. OT placed pt's shirt over his head, the with extra time, he was able to get B UE's into shirt with min A. Max A to thread pant legs, but worked on sit<>stands with min/mod A and facilitation for upright trunk and full hp extension. Pt able to grasp toothbrush with L hand and bursh teeth with set-up A. Pt brought to therapy gym for UB NMR. Utilized theraputty in medium  sized balls flattened onto table. Pt had to use finger to feel off putty and place in container. OT played patient preferred music and used song to incorporate B UE movement across body, overhead, and across midline. Focus on trying move B UE's to the beat at the same time. Pt returned to room and left seated in wc with alarm belt on, call bell in reach, and needs met.   Therapy Documentation Precautions:  Precautions Precautions: Fall Precaution Comments: R hemi, diplopia/visual deficits Restrictions Weight Bearing Restrictions: No General: General OT Amount of Missed Time: 30 Minutes Pain:  Denies pain   Therapy/Group: Individual Therapy  Valma Cava 08/23/2021, 2:45 PM

## 2021-08-24 NOTE — Progress Notes (Signed)
PROGRESS NOTE   Subjective/Complaints: Met pt out in the hall about to go to gym. Slept better last night. No new issues today  ROS: Patient denies fever, rash, sore throat, blurred vision, nausea, vomiting, diarrhea, cough, shortness of breath or chest pain, joint or back pain, headache, or mood change.   Objective:   No results found. Recent Labs    08/23/21 0545  WBC 9.2  HGB 12.4*  HCT 36.7*  PLT 379    Recent Labs    08/23/21 0545  NA 136  K 3.5  CL 102  CO2 26  GLUCOSE 105*  BUN 8  CREATININE 0.96  CALCIUM 9.0     Intake/Output Summary (Last 24 hours) at 08/24/2021 0958 Last data filed at 08/24/2021 0131 Gross per 24 hour  Intake 720 ml  Output 0 ml  Net 720 ml        Physical Exam: Vital Signs Blood pressure 105/73, pulse 78, temperature 97.9 F (36.6 C), temperature source Oral, resp. rate 16, height '5\' 2"'  (1.575 m), weight 98 kg, SpO2 99 %. Constitutional: No distress . Vital signs reviewed. HEENT: NCAT, EOMI, oral membranes moist Neck: supple Cardiovascular: RRR without murmur. No JVD    Respiratory/Chest: CTA Bilaterally without wheezes or rales. Normal effort    GI/Abdomen: BS +, non-tender, non-distended Ext: no clubbing, cyanosis, or edema Psych: pleasant and cooperative  Skin: Warm and dry.  Intact.. Musc: No edema in extremities.  No tenderness in extremities. Neuro: Alert Speech was about as clear as i've heard it thus far. Breathing better with phonation Motor: RUE 5/5, RLE 4-5/5 LUE and LLE 3+ to 4/5--some improvement in limb ataxia  Assessment/Plan: 1. Functional deficits which require 3+ hours per day of interdisciplinary therapy in a comprehensive inpatient rehab setting. Physiatrist is providing close team supervision and 24 hour management of active medical problems listed below. Physiatrist and rehab team continue to assess barriers to discharge/monitor patient progress  toward functional and medical goals  Care Tool:  Bathing    Body parts bathed by patient: Chest, Abdomen, Right arm, Right upper leg, Left upper leg, Face, Front perineal area   Body parts bathed by helper: Buttocks, Right lower leg, Left lower leg     Bathing assist Assist Level: Moderate Assistance - Patient 50 - 74%     Upper Body Dressing/Undressing Upper body dressing   What is the patient wearing?: Pull over shirt    Upper body assist Assist Level: Maximal Assistance - Patient 25 - 49%    Lower Body Dressing/Undressing Lower body dressing      What is the patient wearing?: Incontinence brief, Pants     Lower body assist Assist for lower body dressing: Maximal Assistance - Patient 25 - 49%     Toileting Toileting    Toileting assist Assist for toileting: Total Assistance - Patient < 25%     Transfers Chair/bed transfer  Transfers assist  Chair/bed transfer activity did not occur: Safety/medical concerns  Chair/bed transfer assist level: Maximal Assistance - Patient 25 - 49% (stand pivot)     Locomotion Ambulation   Ambulation assist   Ambulation activity did not occur: Safety/medical concerns  Assist  level: 2 helpers Assistive device: Hand held assist Max distance: 5   Walk 10 feet activity   Assist     Assist level: 2 helpers (+3 WC follow) Assistive device: Hand held assist   Walk 50 feet activity   Assist Walk 50 feet with 2 turns activity did not occur: Safety/medical concerns         Walk 150 feet activity   Assist Walk 150 feet activity did not occur: Safety/medical concerns         Walk 10 feet on uneven surface  activity   Assist Walk 10 feet on uneven surfaces activity did not occur: Safety/medical concerns         Wheelchair     Assist Is the patient using a wheelchair?: No             Wheelchair 50 feet with 2 turns activity    Assist            Wheelchair 150 feet activity      Assist          Blood pressure 105/73, pulse 78, temperature 97.9 F (36.6 C), temperature source Oral, resp. rate 16, height '5\' 2"'  (1.575 m), weight 98 kg, SpO2 99 %.  Medical Problem List and Plan: 1. 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 Interdisciplinary team conference today to discuss goals, barriers to discharge, and dc planning.   2.  Impaired mobility: -DVT/anticoagulation:  Pharmaceutical: Continue Lovenox. Monitoring creatinine on Thursdays             -antiplatelet therapy: Aspirin 81 mg daily and Brilinta 90 mg twice daily 3. Pain Management:   Tylenol as needed for vascular headache 4. Depression/insomnia/agitation: Continue Zoloft 100 mg daily, melatonin 3 mg nightly             -antipsychotic agents: Seroquel 50 mg nightly for agitation  -mood is positive    5. Neuropsych: This patient is not fully capable of making decisions on his own behalf. 6. Skin/Wound Care: Routine skin checks 7. Fluids/Electrolytes/Nutrition: encourage PO  -hyponatremia 136 11/21 and again 12/5   -continue current plan 8.  Hyperlipidemia.  Continue Lipitor 9.  History of alcohol and marijuana use.  Urine drug screen positive marijuana.  Counseling 10. Morbid Obesity- BMI 39.52- counseling and diet education 11.  Transaminitis  12/1 hep panel negative, HIV neg  LFTs still elevated, but improved on 12/2 12. Mild leukocytosis: Resolved  Afebrile  -no s/s infection 14.  Slow transit constipation  Bowel meds increased on 12/4--->moving bowels   LOS: 19 days A FACE TO FACE EVALUATION WAS PERFORMED  Meredith Staggers 08/24/2021, 9:58 AM

## 2021-08-24 NOTE — Patient Care Conference (Signed)
Inpatient RehabilitationTeam Conference and Plan of Care Update Date: 08/24/2021   Time: 10:36 AM    Patient Name: Roy [name]      Medical Record Number: [id number]  Date of Birth: 01-23-1996 Sex: Male         Room/Bed: 4W20C/4W20C-01 Payor Info: Payor: MEDICAID POTENTIAL / Plan: MEDICAID POTENTIAL / Product Type: *No Product type* /    Admit Date/Time:  08/05/2021  2:14 PM  Primary Diagnosis:  Posterior circulation stroke [facility])  Hospital Problems: Principal Problem:   Posterior circulation stroke (HCC) Active Problems:   Leukocytosis   Transaminitis   Dyslipidemia   Hyponatremia   Vascular headache   Sleep disturbance   Slow transit constipation    Expected Discharge Date: Expected Discharge Date: 09/03/21  Team Members Present: Physician leading conference: Dr. Faith Rogue Social Worker Present: Cecile Sheerer, LCSWA Nurse Present: Kennyth Arnold, RN PT Present: Malachi Pro, PT OT Present: Kearney Hard, OT SLP Present: Feliberto Gottron, SLP PPS Coordinator present : Fae Pippin, SLP     Current Status/Progress Goal Weekly Team Focus  Bowel/Bladder   Continent of B/B. LBM 08/22/21  Maintain continence      Swallow/Nutrition/ Hydration             ADL's   Mod A transfers, sit<>stand mod/min A, UE function improving  Min A  B UE NMR, transfrs, sit<>stand, cognitive retraining, self-care   Mobility   minA/modA bed mobility, minA/modA sit to stand, modA +2 84' with EVA walker  minA      Communication   Min-Mod A  Min A  use of over-articulation, easy onset, self-monitoring and correcting of verbal perseveration, diaphragmatic breathing   Safety/Cognition/ Behavioral Observations  Supervision-Min A  Min A  selective attention, emergent awareness, functional problem solving   Pain   Denies pain  Remain pain free.  Assess q shift and prn.   Skin   Skkin Intact  Prevent new breakdowns.  Asssess Q  shift and prn.     Discharge Planning:  Pt is  uninsured. Pt to d/c to home with his sister who does not work. Support from other family as well. Family meeting scheduled for 12/9 at 10am. SW will confirm no barriers to discharge.   Team Discussion: Speech noticeably improved. Slept well last night. Monitoring labs. No current nursing issues. Family meeting scheduled for Friday at 10 AM. Ambulated 60 ft mod assist +2 in the EVA walker. Continuing to work on Systems developer. BUE showing more function. Patient on target to meet rehab goals: yes, min assist goals. Currently min/mod assist.  *See Care Plan and progress notes for long and short-term goals.   Revisions to Treatment Plan:  Not at this time.  Teaching Needs: Family education, medication management, safety awareness, transfer/gait training, etc.  Current Barriers to Discharge: Decreased caregiver support, Home enviroment access/layout, Lack of/limited family support, and Medication compliance  Possible Resolutions to Barriers: Family education Family meeting Order DME Follow up PT/OT/SLP     Medical Summary Current Status: improving speech and balance/coordination. LFT' normalizing. mood improved. sleeping well  Barriers to Discharge: Medical stability   Possible Resolutions to [employer] Focus: daily assessment of labs and pt data, maximizing sleep/wake   Continued Need for Acute Rehabilitation Level of Care: The patient requires daily medical management by a physician with specialized training in physical medicine and rehabilitation for the following reasons: Direction of a multidisciplinary physical rehabilitation program to maximize functional independence : Yes Medical management of patient stability for increased activity  during participation in an intensive rehabilitation regime.: Yes Analysis of laboratory values and/or radiology reports with any subsequent need for medication adjustment and/or medical intervention. : Yes   I attest that I was  present, lead the team conference, and concur with the assessment and plan of the team.   Tennis Must 08/24/2021, 3:22 PM

## 2021-08-24 NOTE — Progress Notes (Signed)
Physical Therapy Session Note  Patient Details  Name: Roy [name] MRN: [id number] Date of Birth: 1996/03/16  Today's Date: 08/24/2021 PT Individual Time: 3300-7622 and 1103-1200 PT Individual Time Calculation (min): 56 min and 57 min  Short Term Goals: Week 3:  PT Short Term Goal 1 (Week 3): Pt will perform bed mobility consistently with minA. PT Short Term Goal 2 (Week 3): Pt will perform sit to stand consistently with minA. PT Short Term Goal 3 (Week 3): Pt will perform bed to chair consistently with minA. PT Short Term Goal 4 (Week 3): Pt will ambulate x25' with modA and LRAD.  Skilled Therapeutic Interventions/Progress Updates:     1st Session: Pt received supine in bed and agrees to therapy. No complaint of pain. Pt attempt supine to sit without physical assistance, and is able to nearly complete, ~75% of full transition, but is unable to complete due to lack of adequate anterior shift of COG. MinA/modA to complete on 2nd attempt. Pt performs sit to stand from EOB with minA and multimodal cues for body mechanics and posture. Stand step transfer to [facility] with modA facilitation of lateral weight shifting, with step-by-step verbal cues for sequencing. WC transport to gym for time management. Stand step transfer to mat table with same cues, modA. Session primarily focused on NMR, with emphasis on core control, anterior weight transition, and rotation. Pt positioned in short sitting on edge of mat with blue platform between legs. Pt attempts to retrieve 5lb weight placed at opposite end of platform, but is unable to grip, so weight exchanged for 3lb. Pt able to retrieve 3lb dumbbell, then returns to neutral posture with cues for trunk extension and core engagement. Pt rotates to contralateral side from upper extremity ho9lding weight, cued to reach weight across body to hand to rehab tech. Pt alternates hands and completes x10 reps each. PT provides tactile cues at hips and trunk to facilitate  extension and then rotation. Pt able to reach with more control and larger amplitude with L upper extremity vs R.   Activity progressed to having pt attempt sit to stand transfer with platform between legs to provide pt with external "safety net", giving him confidence to shift shoulders forward to adeuately transition COG for sit to stand. After several attempts pt is able to complete with only verbal cues, emphasizing glute contraction and sequencing of transfer. Pt completes x5 with platform, then x5 additional reps with platform removed and with PT positioned very close to pt. On final bout of standing PT provides perturbations, primarily anterior->posterior, with pt able to remain standing, using backs of legs against mat to brace himself. Pt then attempts stand step transfer to [facility] without physical assistance but has posterior LOB, sitting back on mat. ModA required for safe transfer. Pt left seated in WC with alarm intact and all needs within reach.  2nd Session: [facility] and agrees to therapy. No complaint of pain. WC transpor to gym for time management. Pt performs stand step transfer to mat table with modA and step-by-step cueing for weight shifting, foot progression, and positioning. Session focused on forward and backward ambulation with mirror provided for visual feedback. Heavy modA +2 required for majority of activity due to poor motor planning and trunk control. Forward x10' and backward x10'. Facilitation of lateral weight shifting, cues for shorter stride lengths, maintaining upright gaze and trunk extension to improve balance. 3 lb ankle weights added to provide increased proprioceptive input, especially for L lower extremity.  Pt ataxia slightly improved with ankle weights. Pt works on blocked practice of forward and backward step with L lower extremity, which is more ataxic than R lower extremity. Seated rest breaks in between each bout. Pt attempts ambulating with +1 assist  but rehab tech continues to be required mid gait due to poor coordination and balance. Following, pt performs stand step transfer back to [facility] with modA +1. Left seated in WC with alarm intact and all needs within reach.  Therapy Documentation Precautions:  Precautions Precautions: Fall Precaution Comments: R hemi, diplopia/visual deficits Restrictions Weight Bearing Restrictions: No   Therapy/Group: Individual Therapy  Beau Fanny, PT, DPT 08/24/2021, 12:53 PM

## 2021-08-24 NOTE — Progress Notes (Signed)
Occupational Therapy Session Note  Patient Details  Name: Roy [name] MRN: [id number] Date of Birth: 1996/02/24  Today's Date: 08/24/2021 OT Individual Time: 2411-4643 OT Individual Time Calculation (min): 43 min    Short Term Goals: Week 3:  OT Short Term Goal 1 (Week 3): Patient will complete 1 step of UB dressing task OT Short Term Goal 2 (Week 3): Pt will stand at the sink with mod A of 1 person in preparation for BADL task OT Short Term Goal 3 (Week 3): Patient will use R UE to grasp toothbrush while applying toothpaste with L hand and overall mod A  Skilled Therapeutic Interventions/Progress Updates:    Pt greeted seated in wc and wanting to shower. Pt completed sit<>stand in [place] with supervision. Able to get B hands to stedy bar with cues. Bathing completed seated on shower bench with pt able to use B hands for 40% of bathing tasks. Pt able to hold wash cloth with R hand and wash L arm today!! Utilized LH sponge with good grapsp on L hand to wash lower legs. B hand grasp with wringing out sponge after bathing. Stedy transfer out of shower with min A. Pt needed mod A to don shirt with increased time and max A for LB dressing due to time. Pt left seated in wc with alarm belt on, call bell in reach, and needs met.   Therapy Documentation Precautions:  Precautions Precautions: Fall Precaution Comments: R hemi, diplopia/visual deficits Restrictions Weight Bearing Restrictions: No Pain: Pain Assessment Pain Scale: 0-10 Pain Score: 0-No pain    Therapy/Group: Individual Therapy  Valma Cava 08/24/2021, 10:56 AM

## 2021-08-24 NOTE — Progress Notes (Signed)
Speech Language Pathology Daily Session Note  Patient Details  Name: Roy [name] MRN: [id number] Date of Birth: December 19, 1995  Today's Date: 08/24/2021 SLP Individual Time: 1430-1500 SLP Individual Time Calculation (min): 30 min  Short Term Goals: Week 3: SLP Short Term Goal 1 (Week 3): Patient will demonstrate sustained attention to functional tasks for ~25 minutes with Min verbal cues for redirection. SLP Short Term Goal 2 (Week 3): Patient will self-monitor and correct verbal perseveration at the phrase level with Max A verbal and visual cues in 50% of opportunities. SLP Short Term Goal 3 (Week 3): Patient will utilize diaphragmatic breathing at the phrase level with Min A multimodal cues. SLP Short Term Goal 4 (Week 3): Patient will utilize speech intelligibility strategies at the phrase level with Mod A multimodal cues to achieve ~75% intelligibility. SLP Short Term Goal 5 (Week 3): Patient will demonstrate functional problem solving for basic and familiar tasks with Min verbal cues.  Skilled Therapeutic Interventions: Skilled treatment session focused on communication goals. SLP facilitated session by providing supervision level verbal cues for patient to self-monitor and correct phonemic errors while producing phonemes /d/ and /g/ in initial position at the word level and while producing two word utterances  with the /g/ and /d/ in random positions. Overall, patient was ~90% intelligible. Patient also participated in a semi-structured verbal reasoning task in which he was Mod I for reasoning and ~90% intelligible at the phrase level with supervision level verbal cues. Patient left upright in the wheelchair with alarm on and all needs within reach. Continue with current plan of care.   Pain Pain Assessment Pain Scale: 0-10 Pain Score: 0-No pain  Therapy/Group: Individual Therapy  Roy [name] 08/24/2021, 3:04 PM

## 2021-08-24 NOTE — Progress Notes (Signed)
Patient ID: Roy [name], male   DOB: [birth year], 25 y.o.   MRN: [id number]  SW left message for pt sister [name] ([phone]) to provide updates from team conference, and confirm family meeting on Friday (12/9) at 10am. SW waiting on follow-up.   Cecile Sheerer, MSW, LCSWA Office: [phone] Cell: [phone] Fax: [phone]

## 2021-08-25 NOTE — Progress Notes (Signed)
Physical Therapy Session Note  Patient Details  Name: Roy [name] MRN: [id number] Date of Birth: 1996/07/24  Today's Date: 08/25/2021 PT Individual Time: 0900-1000 and 1330-1431 PT Individual Time Calculation (min): 60 min and 61 min  Short Term Goals: Week 2:  PT Short Term Goal 1 (Week 2): Pt will perform bed mobility consistently with modA PT Short Term Goal 1 - Progress (Week 2): Met PT Short Term Goal 2 (Week 2): Pt will perform sit to stand transfer consistently with minA. PT Short Term Goal 2 - Progress (Week 2): Progressing toward goal PT Short Term Goal 3 (Week 2): Pt will perform bed to chair transfer consistently with modA. PT Short Term Goal 3 - Progress (Week 2): Met PT Short Term Goal 4 (Week 2): Pt will ambulate x25' with modA +2. PT Short Term Goal 4 - Progress (Week 2): Met Week 3:  PT Short Term Goal 1 (Week 3): Pt will perform bed mobility consistently with minA. PT Short Term Goal 2 (Week 3): Pt will perform sit to stand consistently with minA. PT Short Term Goal 3 (Week 3): Pt will perform bed to chair consistently with minA. PT Short Term Goal 4 (Week 3): Pt will ambulate x25' with modA and LRAD.  Skilled Therapeutic Interventions/Progress Updates:  Session 1: Patient supine in bed on entrance to room. Patient alert and agreeable to PT session. [name] girlfriend in room and visibly/ verbally upset. Something pt said to her upset her and she is apparently stressed with juggling numerous family and homelife issues. This therapist brought focus back to pt and getting ready for therapy session allowing girlfriend to ready self to leave for day/ work.  Patient with no pain complaint throughout session.  Therapeutic Activity: Bed Mobility: Pants donned in supine with pt able to thread feet into pant leg. Requires MaxA to complete. Pt bridges to complete. Patient performed supine <> sit with Mod/ MaxA following instructions to bring BLE off EOB and then pushing with UE  from elevated HOB to reach seated position. Requires ModA for forward scoot to EOB for bil feet supported on floor. VC/ tc required throughout for technique.  Transfers: Patient performed lateral scoot on bedside with MinA after producing appropriate forward lean and activating lift. MinA req'd for lateral movement. With proper foot positioning, pt is able to perform squat/stand pivot without stepping and reducing torque to ankle/ knee throughout session with MinA. Requires ModA without proper foot positioning. MinA with consistent vc/ tc for guidance throughout session to produce forward lean to produce scoot forward/ backward in w/c.   Neuromuscular Re-ed: NMR facilitated during session with focus on sitting balance, proprioception, finding midline. Pt guided in seated scooting along EOM, trunk activation with twist and reach for 3# weighted ball with BUE and pass off to rehab tech to opposite side. Pt also guided in abdominal activation using theraball behind pt placed for tc and support to back as neccessary. Pt able to perform modified crunches 2x10.  NMR performed for improvements in motor control and coordination, balance, sequencing, judgement, and self confidence/ efficacy in performing all aspects of mobility at highest level of independence.   Patient seated upright in w/c at end of session with brakes locked, belt alarm set, and all needs within reach.  Session 2:  Patient seated upright in w/c on entrance to room. Patient alert and agreeable to PT session.   Patient with no pain complaint throughout session.  Therapeutic Activity: Transfers: Patient performed sit<>stand and stand pivot transfers  throughout session with Min/ ModA. Provided vc/tc for weight shifting and stepping.  Gait Training:  Patient ambulated 8' x2 using // bars with heavy CGA/ MinA to facilitate lateral weight shift. Pt responds well to vc/ tc for shifting weight to one LE and stepping with other. Pt progressed to  ambulation using EVA walker requiring Mod/ MaxA to control walker, but pt able to advance BLE with no physical assist except for increasing weight shift from L to R more so than R to L. Ambulates 4' x1/ 27' x1 with +2 for safety and walker control.   Patient seated  in w/c at end of session with brakes locked, belt alarm set, and all needs within reach. Rehab Tech remaining in room with pt for next session with OT to start within 15 min.      Therapy Documentation Precautions:  Precautions Precautions: Fall Precaution Comments: R hemi, diplopia/visual deficits Restrictions Weight Bearing Restrictions: No General:   Pain:  No pain complaint this day  Therapy/Group: Individual Therapy  Alger Simons PT, DPT 08/25/2021, 8:18 AM

## 2021-08-25 NOTE — Progress Notes (Signed)
Occupational Therapy Session Note  Patient Details  Name: Roy [name] MRN: [id number] Date of Birth: 09/09/1996  Today's Date: 08/25/2021 OT Individual Time: 1445-1539 OT Individual Time Calculation (min): 54 min    Short Term Goals: Week 3:  OT Short Term Goal 1 (Week 3): Patient will complete 1 step of UB dressing task OT Short Term Goal 2 (Week 3): Pt will stand at the sink with mod A of 1 person in preparation for BADL task OT Short Term Goal 3 (Week 3): Patient will use R UE to grasp toothbrush while applying toothpaste with L hand and overall mod A   Skilled Therapeutic Interventions/Progress Updates:    Pt greeted at time of session up in wheelchair agreeable to OT session, no pain. Pt requesting to brush teeth, self propel with BLE propulsion to sink with Min A. Pt brushed teeth with Mod/Max A able to hold toothbrush in L hand and toothepaste in R, needing assist to fully squeeze tube and place on toothbrush but able to then brush teeth. Needing assist from therapist eventually for thoroughness. Transported to gym and performed SCIFIT with both hands ACE wrapped to support grip for 4 minutes as UB warm up and provide reciprocal movement. Set up at rebounder trampoline and performed 1x10 throws with large lightweight ball with Max A for hand placement and coordination to improve reaction speed and [place], difficulty coordinating and needing Max A to complete task. 3x30 punches at kickboxing bag alternating UE to improve GMC, anterior weight shift, and core strength. Transported back to room and alarm on call bell in reach, all needs met.   Therapy Documentation Precautions:  Precautions Precautions: Fall Precaution Comments: R hemi, diplopia/visual deficits Restrictions Weight Bearing Restrictions: No     Therapy/Group: Individual Therapy  Viona Gilmore 08/25/2021, 1:01 PM

## 2021-08-25 NOTE — Progress Notes (Signed)
Speech Language Pathology Daily Session Note  Patient Details  Name: Roy [name] MRN: [id number] Date of Birth: [birth year]  Today's Date: 08/25/2021 SLP Individual Time: 1300-1330 SLP Individual Time Calculation (min): 30 min  Short Term Goals: Week 3: SLP Short Term Goal 1 (Week 3): Patient will demonstrate sustained attention to functional tasks for ~25 minutes with Min verbal cues for redirection. SLP Short Term Goal 2 (Week 3): Patient will self-monitor and correct verbal perseveration at the phrase level with Max A verbal and visual cues in 50% of opportunities. SLP Short Term Goal 3 (Week 3): Patient will utilize diaphragmatic breathing at the phrase level with Min A multimodal cues. SLP Short Term Goal 4 (Week 3): Patient will utilize speech intelligibility strategies at the phrase level with Mod A multimodal cues to achieve ~75% intelligibility. SLP Short Term Goal 5 (Week 3): Patient will demonstrate functional problem solving for basic and familiar tasks with Min verbal cues.  Skilled Therapeutic Interventions: Skilled treatment session focused on communication goals. SLP facilitated session by providing overall supervision level verbal cues for patient to self-monitor and correct phonemic errors while producing words that begin with initial /s/ consonant blends. Patient also read at the sentence level and was ~80% intelligible. [name]. Continue with current plan of care.       Pain No/Denies Pain   Therapy/Group: Individual Therapy  [name] 08/25/2021, 3:28 PM

## 2021-08-26 LAB — CREATININE, SERUM
Creatinine, Ser: 0.89 mg/dL (ref 0.61–1.24)
GFR, Estimated: >60 mL/min (ref >60)

## 2021-08-26 NOTE — Progress Notes (Signed)
Speech Language Pathology Daily Session Note  Patient Details  Name: Roy [name] MRN: [id number] Date of Birth: 07/10/1996  Today's Date: 08/26/2021 SLP Individual Time: 1415-1455 SLP Individual Time Calculation (min): 40 min  Short Term Goals: Week 3: SLP Short Term Goal 1 (Week 3): Patient will demonstrate sustained attention to functional tasks for ~25 minutes with Min verbal cues for redirection. SLP Short Term Goal 2 (Week 3): Patient will self-monitor and correct verbal perseveration at the phrase level with Max A verbal and visual cues in 50% of opportunities. SLP Short Term Goal 3 (Week 3): Patient will utilize diaphragmatic breathing at the phrase level with Min A multimodal cues. SLP Short Term Goal 4 (Week 3): Patient will utilize speech intelligibility strategies at the phrase level with Mod A multimodal cues to achieve ~75% intelligibility. SLP Short Term Goal 5 (Week 3): Patient will demonstrate functional problem solving for basic and familiar tasks with Min verbal cues.  Skilled Therapeutic Interventions: Skilled treatment session focused on cognitive goals. SLP facilitated session by providing overall extra time for functional problem solving with basic money and mathematical problems. Patient was also overall Mod I for functional problem solving with basic medication management tasks. Patient demonstrated sustained attention to all tasks for 30 minutes with Mod I. Patient left upright in his wheelchair with alarm on and all needs within reach. Continue with current plan of care.       Pain No/Denies Pain   Therapy/Group: Individual Therapy  [name] 08/26/2021, 3:29 PM

## 2021-08-26 NOTE — Progress Notes (Signed)
Occupational Therapy Session Note  Patient Details  Name: Roy [name] MRN: [id number] Date of Birth: 07/06/1996  Today's Date: 08/27/2021 OT Group Time: 1100-1200 OT Group Time Calculation (min): 60 min  Skilled Therapeutic Interventions/Progress Updates:    Pt engaged in therapeutic w/c level dance group focusing on patient choice, UE/LE strengthening, salience, activity tolerance, and social participation. Pt was guided through various dance-based exercises involving UEs/LEs and trunk. All music was selected by group members. Emphasis placed on NMR, coordination, and activity tolerance. Pt exhibited very(!) high levels of participation with profuse sweating. Visitors were also present, very encouraging and motivating. Pt actively coordinating structured exercises by OT and also doing his own dance moves, synchronizing simultaneous UE/LE movement and also core. Pt also sang to multiple familiar songs. He was tilted back in liberty w/c, safety belt fastened. Visitors assisted pt back to the room for lunch.   Therapy Documentation Precautions:  Precautions Precautions: Fall Precaution Comments: R hemi, diplopia/visual deficits Restrictions Weight Bearing Restrictions: No  Pain: no s/s pain during tx   Therapy/Group: Group Therapy  Roy [name] A Malcolm Hetz 08/27/2021, 12:45 PM

## 2021-08-26 NOTE — Progress Notes (Signed)
PROGRESS NOTE   Subjective/Complaints: Pt in bed. Sleeping well. No pain. Had another good day yesterday.   ROS: Patient denies fever, rash, sore throat, blurred vision, nausea, vomiting, diarrhea, cough, shortness of breath or chest pain, joint or back pain, headache, or mood change.    Objective:   No results found. No results for input(s): WBC, HGB, HCT, PLT in the last 72 hours.   Recent Labs    08/26/21 0508  CREATININE 0.89     Intake/Output Summary (Last 24 hours) at 08/26/2021 0905 Last data filed at 08/25/2021 1830 Gross per 24 hour  Intake 660 ml  Output --  Net 660 ml        Physical Exam: Vital Signs Blood pressure 92/66, pulse 70, temperature 98.5 F (36.9 C), resp. rate 20, height 5\' 2"  (1.575 m), weight 98 kg, SpO2 100 %. Constitutional: No distress . Vital signs reviewed. HEENT: NCAT, EOMI, oral membranes moist Neck: supple Cardiovascular: RRR without murmur. No JVD    Respiratory/Chest: CTA Bilaterally without wheezes or rales. Normal effort    GI/Abdomen: BS +, non-tender, non-distended Ext: no clubbing, cyanosis, or edema Psych: pleasant and cooperative  Skin: Warm and dry.  Intact.. Musc: No edema in extremities.  No tenderness in extremities. Neuro: Alert Articulating/breathing better. Speech still nasal sounding but Breathing better with phonation Motor: RUE 5/5, RLE 4-5/5 LUE and LLE 3+ to 4/5--some improvement in limb ataxia with FTN/HTS  Assessment/Plan: 1. Functional deficits which require 3+ hours per day of interdisciplinary therapy in a comprehensive inpatient rehab setting. Physiatrist is providing close team supervision and 24 hour management of active medical problems listed below. Physiatrist and rehab team continue to assess barriers to discharge/monitor patient progress toward functional and medical goals  Care Tool:  Bathing    Body parts bathed by patient: Chest,  Abdomen   Body parts bathed by helper: Buttocks     Bathing assist Assist Level: Moderate Assistance - Patient 50 - 74%     Upper Body Dressing/Undressing Upper body dressing   What is the patient wearing?: Pull over shirt    Upper body assist Assist Level: Maximal Assistance - Patient 25 - 49%    Lower Body Dressing/Undressing Lower body dressing      What is the patient wearing?: Incontinence brief     Lower body assist Assist for lower body dressing: Maximal Assistance - Patient 25 - 49%     Toileting Toileting    Toileting assist Assist for toileting: Total Assistance - Patient < 25%     Transfers Chair/bed transfer  Transfers assist  Chair/bed transfer activity did not occur: Safety/medical concerns  Chair/bed transfer assist level: Maximal Assistance - Patient 25 - 49%     Locomotion Ambulation   Ambulation assist   Ambulation activity did not occur: Safety/medical concerns  Assist level: 2 helpers Assistive device: Hand held assist Max distance: 5   Walk 10 feet activity   Assist     Assist level: 2 helpers Assistive device: Hand held assist   Walk 50 feet activity   Assist Walk 50 feet with 2 turns activity did not occur: Safety/medical concerns  Walk 150 feet activity   Assist Walk 150 feet activity did not occur: Safety/medical concerns         Walk 10 feet on uneven surface  activity   Assist Walk 10 feet on uneven surfaces activity did not occur: Safety/medical concerns         Wheelchair     Assist Is the patient using a wheelchair?: No             Wheelchair 50 feet with 2 turns activity    Assist            Wheelchair 150 feet activity     Assist          Blood pressure 92/66, pulse 70, temperature 98.5 F (36.9 C), resp. rate 20, height 5\' 2"  (1.575 m), weight 98 kg, SpO2 100 %.  Medical Problem List and Plan: 1. 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  Impaired mobility: -DVT/anticoagulation:  Pharmaceutical: Continue Lovenox. Monitoring creatinine on Thursdays             -antiplatelet therapy: Aspirin 81 mg daily and Brilinta 90 mg twice daily 3. Pain Management:   Tylenol as needed for vascular headache  -pain controlled 4. Depression/insomnia/agitation: Continue Zoloft 100 mg daily, melatonin 3 mg nightly             -antipsychotic agents: Seroquel 50 mg nightly for agitation  -mood remains positive    5. Neuropsych: This patient is not fully capable of making decisions on his own behalf. 6. Skin/Wound Care: Routine skin checks 7. Fluids/Electrolytes/Nutrition: encourage PO  -hyponatremia 136 11/21 and again 12/5   -continue current plan 8.  Hyperlipidemia.  Continue Lipitor 9.  History of alcohol and marijuana use.  Urine drug screen positive marijuana.  Counseling 10. Morbid Obesity- BMI 39.52- counseling and diet education 11.  Transaminitis  12/1 hep panel negative, HIV neg  LFTs still elevated, but improved on 12/2 12. Mild leukocytosis: Resolved  Afebrile  -no s/s infection 14.  Slow transit constipation  Bowel meds increased on 12/4--->moved bowels 12/6   LOS: 21 days A FACE TO FACE EVALUATION WAS PERFORMED  14/2 08/26/2021, 9:05 AM

## 2021-08-26 NOTE — Progress Notes (Signed)
Physical Therapy Session Note  Patient Details  Name: Roy [name] MRN: [id number] Date of Birth: 06-04-1996  Today's Date: 08/26/2021 PT Individual Time: 1300-1400 PT Individual Time Calculation (min): 60 min   Short Term Goals: Week 3:  PT Short Term Goal 1 (Week 3): Pt will perform bed mobility consistently with minA. PT Short Term Goal 2 (Week 3): Pt will perform sit to stand consistently with minA. PT Short Term Goal 3 (Week 3): Pt will perform bed to chair consistently with minA. PT Short Term Goal 4 (Week 3): Pt will ambulate x25' with modA and LRAD.  Skilled Therapeutic Interventions/Progress Updates:     Patient in w/c in the room upon PT arrival. Patient alert and agreeable to PT session. Patient denied pain during session.  Therapeutic Activity: Transfers: Patient performed stand pivot w/c>mat table with min A to stand and mod A to pivot due to decreased motor planning with stepping. Provided verbal cues for sequencing an weight shifting with facilitation and visual cues for foot placement.  Gait Training:  Patient ambulated 82 feet and 95 feet in the Lite Gait with no body weight support on first trial and ~30 body weight support on second trial with facilitation for weight shift and multimodal cues for sequencing, erect posture, hip/knee elongation in stance, and increased BOS. Stepping pattern much improved with increased body weight support. Ambulated with ataxic gait with extensor patterning on L lower extremity leading to increased hip adduction with stepping and slow knee flexion in pre-swing.  Patient ambulated 24 feet using a RW with 2x4 lb weights on the front legs and 3 lb weight on B ankles with mod A +2 and max cues for foot placement and sequencing. Patient's girlfriend stood in front for visual target and motivation.   Wheelchair Mobility:  Patient propelled wheelchair 45 feet with min A progressing to supervision. Patient unable to follow cues for forward  propulsion with B lower extremities initially, has patient propel backwards x20 feet for improved motor control, then patient able to turn 180 deg and progress too forward propulsion and added B upper extremity propulsion with max cuing for coordination. W/c propulsion very slow and laborious at this time.   Neuromuscular Re-ed: Patient performed the following lower extremity motor control activities: -sit to stand with min A-CGA with B upper extremity support and Lite gait handles x2 and with RW x8 focused on midline orientation to reduce R lean, hip/knee/trunk extension to come to standing, and controlled initiation with forward weight shift and descent -standing marching hold RW with focus on initiation of knee flexion of L lower extremity and weight shifting without trunk lean x10 with min-mod A for balance and weight shift  Patient in w/c with his girlfriend in the room at end of session with breaks locked, seat belt alarm set, and all needs within reach. Patient reports he has been moving the w/c in the room and even into the bathroom. Educated patient on safety risk going into the bathroom due to incline at doorway and that he is not safe to attempt transfers on his own and needs to call for assistance. Cleared patient for in room w/c mobility as able, but reinforced no transfers without assistance at this time. Patient and his girlfriend in agreement.   Therapy Documentation Precautions:  Precautions Precautions: Fall Precaution Comments: R hemi, diplopia/visual deficits Restrictions Weight Bearing Restrictions: No    Therapy/Group: Individual Therapy  Maicol Bowland L Demarie Hyneman PT, DPT  08/26/2021, 4:33 PM

## 2021-08-26 NOTE — Progress Notes (Signed)
Occupational Therapy Session Note  Patient Details  Name: Roy [name] MRN: [id number] Date of Birth: June 26, 1996  Today's Date: 08/26/2021 OT Individual Time: 1046-1200 OT Individual Time Calculation (min): 74 min    Short Term Goals: Week 3:  OT Short Term Goal 1 (Week 3): Patient will complete 1 step of UB dressing task OT Short Term Goal 2 (Week 3): Pt will stand at the sink with mod A of 1 person in preparation for BADL task OT Short Term Goal 3 (Week 3): Patient will use R UE to grasp toothbrush while applying toothpaste with L hand and overall mod A  Skilled Therapeutic Interventions/Progress Updates:    Pt in bed to start with significant other in the room.  He was able to transfer to the EOB with mod assist and then work on donning his pants and shoes.  Max assist overall for pants with hemi sequencing, beginning with the RLE first.  He demonstrated decreased efficiency with holding onto the pants while attempting to thread them as well as when attempting to pull them up.  He was better with attempted use of the LUE but lacks sensation for sustained grip and needs mod assist to maintain grip on the right.  Max assist for standing to pull them up over his hips with total assist for donning slip on shoes prior to standing.  He was able to then complete stand pivot transfer to the wheelchair at max assist level and was taken down to the therapy gym.  Most of session had pt working on weightbearing and trunk activation in quadriped.  Had him stand from the wheelchair and crawl onto the mat with hands and knees and total +2 (pt 40%).  Worked on backward and forward weightshifts to increase weightbearing over BUEs to start.  Progressed to having him unweight the LUE for washing the therapy mat with a washcloth as well as for reaching to pick up and place foam blocks in a container.  He needed rest breaks in tall kneeling as well as left and right sidelying secondary to fatigue with perfuse  sweating noted.  Max assist to transition from quadriped to tall kneeling with mod assist to maintain secondary to forward LOB and decreased sustained hip extension.  He needed total assist +2 (pt 50%) for transition for right sidelying back up to quadriped as well.  He was then able to transition from sidelying to sitting with mod assist.  Worked on scooting up to the left on the mat with mod demonstrational cueing and mod assist.  Then worked on standing and stepping toward target with the LUE for increased weightshift and weightbearing over the RLE.  Pt with increased trunk and cervical flexion in standing when attempting to move the LLE.  He also demonstrated decreased ability to control how far forward or backwards he stepped with the LLE.  Increased shaking noted in the LLE and RLE at times mimicking clonus.  Total +2 (pt 40%) needed for dynamic standing balance while working on pre-gait tasks.  Finished session with stand pivot transfer back to the wheelchair at total +2 (pt 40%) with pt being rolled back to the room where he was left sitting up in the tilt in space wheelchair with the call button and phone in reach and safety alarm belt in place.    Therapy Documentation Precautions:  Precautions Precautions: Fall Precaution Comments: R hemi, diplopia/visual deficits Restrictions Weight Bearing Restrictions: No   Pain: Pain Assessment Pain Scale: Faces Pain Score: 0-No  pain ADL: See Care Tool Section for some details of mobility and selfcare   Therapy/Group: Individual Therapy  Gavin Faivre OTR/L 08/26/2021, 12:33 PM

## 2021-08-26 NOTE — Progress Notes (Signed)
Patient ID: Roy [name], male   DOB: [birth year], 25 y.o.   MRN: [id number]  SW left message for pt sister [name] ([phone]) to confirm family meeting on Friday (12/9) at 10am. SW waiting on follow-up.   Marland Kitchena

## 2021-08-27 MED ORDER — SENNOSIDES-DOCUSATE SODIUM 8.6-50 MG PO TABS
2.0000 | ORAL_TABLET | Freq: Every day | ORAL | Status: DC
  Administered 2021-08-27 – 2021-09-01 (×6): 2 via ORAL
  Filled 2021-08-27 (×6): qty 2

## 2021-08-27 MED ORDER — SORBITOL 70 % SOLN
30.0000 mL | Freq: Once | Status: AC
  Administered 2021-08-27: 30 mL via ORAL
  Filled 2021-08-27: qty 30

## 2021-08-27 NOTE — Progress Notes (Signed)
Physical Therapy Note  Patient Details  Name: Roy [name] MRN: [id number] Date of Birth: 25-Jul-1996 Today's Date: 08/27/2021    Entered patients room due to audible yelling from within >5 min while PT was with another patient. Patient in the room pulling on an arm chair with multiple items scattered on the floor and him and his girlfriend yelling at each other. Able to calm both and found out that the conflict was about the patient eating his dinner. Educated on decreased frustration tolerance and challenges with changes in relationship dynamics after stroke. Encouraged the patient's girlfriend to take a break when frustration escalates. She declined at this time. Patient agreeable to have NT assist with eating. Provided patient with new bed side table due to the other being in use by his girlfriend. Patient and his girlfriend calm and appreciative when PT exited the room.    Syliva Mee L Baudelia Schroepfer PT, DPT  08/26/2021, 1540 PM

## 2021-08-27 NOTE — Progress Notes (Signed)
Occupational Therapy Weekly Progress Note  Patient Details  Name: Roy [name] MRN: [id number] Date of Birth: [birth year]  Beginning of progress report period: August 21, 2021 End of progress report period: August 27, 2021  Today's Date: 08/27/2021 OT Individual Time: 1304-1401 OT Individual Time Calculation (min): 57 min    Patient has met 1 of 3 short term goals.  Roy [name] continues to make slow but steady progress with OT at this time.  He currently needs min to mod assist for UB bathing with max assist for UB dressing.  LB bathing and dressing are also max assist sit to stand secondary to ataxia as well decreased sensory processing in the left UE and LE.  He continues to complete squat pivot transfers at mod assist but needs total +2 (pt 30%) for stand pivot transfers without use of an assistive device.  LUE functional use is at a gross assist level with min assist overall as he demonstrates decreased coordination but also decreased proprioception and stereognosis when attempting to hold items with the hand such as the chair arm rests or when attempting to pull up clothing.  The RUE demonstrates increased ataxia as well as decreased functional use being more impaired than the left and needing mod to max assist for integration into selfcare tasks.  Roy [name] continues to exhibit diplopia in most visual fields but does demonstrate small areas of fused single vision in the left superior and inferior fields.  Overall, feel he is continuing to make steady progress with OT at this time with anticipated discharge on 12/16.  Goals are currently min assist level overall and feel based on his significant physical deficits at this time, that he will likely not meet these.  Will downgrade goals as well.  Recommend continued CIR level therapy with focus on neuromuscular re-education and selfcare retraining, as well as family education in preparation for discharge.     Patient continues to demonstrate  the following deficits: muscle weakness and muscle paralysis, impaired timing and sequencing, unbalanced muscle activation, motor apraxia, ataxia, decreased coordination, and decreased motor planning, decreased visual motor skills and diplopia, decreased motor planning, decreased safety awareness, and decreased sitting balance, decreased standing balance, decreased postural control, hemiplegia, and decreased balance strategies and therefore will continue to benefit from skilled OT intervention to enhance overall performance with BADL and Reduce care partner burden.  Patient not progressing toward long term goals.  See goal revision..  Continue plan of care.  OT Short Term Goals Week 4:  OT Short Term Goal 1 (Week 4): Continue working on established LTGs downgraded to mod assist overall.  Skilled Therapeutic Interventions/Progress Updates:    Pt in wheelchair ready for therapy to start.  Took him down to the therapy gym for OT session with second therapist available to help treatment.  He was able to transfer from the wheelchair onto the therapy mat at total +2 (pt 30%) in quadriped position.  Worked on walking forward on the mat on all fours with mod assist.  Incorporated simple forward and backwards weightshifts onto and off of hands with min mod facilitation to maintain midline.  Progressed to lifting the LUE off of the mat to reach to target while maintaining weightbearing on the right.  Mod to max assist needed to maintain balance as well as for weightshift to the right.  Max assist for transitions to tall kneeling to rest with increased forward lean and decreased hip extension noted.  He would rest down in right sidelying with  increased fatigue, which required max assist to achieve.  Total +2 (pt 30%) was needed for transition back to quadriped from left sidelying.  Transitioned back to sitting with max facilitation and worked on reciprical scooting on the EOM.  He needs mod demonstrational cueing to  complete on the right side with tendency to try and use the left side to complete all scoots forward without separation of left and right.  Mod assist for scooting up the EOM and for squat pivot transfer to the wheelchair.  He was left sitting up in the wheelchair per request with the call button and phone in reach.  Safety alarm belt in place as well.   Therapy Documentation Precautions:  Precautions Precautions: Fall Precaution Comments: R hemi, diplopia/visual deficits Restrictions Weight Bearing Restrictions: No  Pain: Pain Assessment Pain Scale: Faces Pain Score: 0-No pain ADL: See Care Tool Section for some details of mobility and selfcare   Therapy/Group: Individual Therapy  Itzamara Casas OTR/L 08/27/2021, 3:52 PM

## 2021-08-27 NOTE — Progress Notes (Signed)
PROGRESS NOTE   Subjective/Complaints: Had a good night. Denies pain. Sleeping well. Happy that he's making progress.  ROS: Patient denies fever, rash, sore throat, blurred vision, nausea, vomiting, diarrhea, cough, shortness of breath or chest pain, joint or back pain, headache, or mood change.    Objective:   No results found. No results for input(s): WBC, HGB, HCT, PLT in the last 72 hours.   Recent Labs    08/26/21 0508  CREATININE 0.89     Intake/Output Summary (Last 24 hours) at 08/27/2021 0824 Last data filed at 08/26/2021 1330 Gross per 24 hour  Intake 240 ml  Output --  Net 240 ml        Physical Exam: Vital Signs Blood pressure 102/68, pulse 82, temperature 98.3 F (36.8 C), resp. rate 16, height 5\' 2"  (1.575 m), weight 98 kg, SpO2 100 %. Constitutional: No distress . Vital signs reviewed. HEENT: NCAT, EOMI, oral membranes moist Neck: supple Cardiovascular: RRR without murmur. No JVD    Respiratory/Chest: CTA Bilaterally without wheezes or rales. Normal effort    GI/Abdomen: BS +, non-tender, non-distended Ext: no clubbing, cyanosis, or edema Psych: pleasant and cooperative  Skin: Warm and dry.  Intact.. Musc: No edema in extremities.  No tenderness in extremities. Neuro: Alert Speech still nasal/ataxic but much more intelligible.  Motor: RUE 5/5, RLE 4-5/5 LUE and LLE 3+ to 4/5- improving limb ataxia with FTN/HTS  Assessment/Plan: 1. Functional deficits which require 3+ hours per day of interdisciplinary therapy in a comprehensive inpatient rehab setting. Physiatrist is providing close team supervision and 24 hour management of active medical problems listed below. Physiatrist and rehab team continue to assess barriers to discharge/monitor patient progress toward functional and medical goals  Care Tool:  Bathing    Body parts bathed by patient: Chest, Abdomen   Body parts bathed by helper:  Buttocks     Bathing assist Assist Level: Moderate Assistance - Patient 50 - 74%     Upper Body Dressing/Undressing Upper body dressing   What is the patient wearing?: Pull over shirt    Upper body assist Assist Level: Maximal Assistance - Patient 25 - 49%    Lower Body Dressing/Undressing Lower body dressing      What is the patient wearing?: Incontinence brief     Lower body assist Assist for lower body dressing: Maximal Assistance - Patient 25 - 49%     Toileting Toileting    Toileting assist Assist for toileting: Total Assistance - Patient < 25%     Transfers Chair/bed transfer  Transfers assist  Chair/bed transfer activity did not occur: Safety/medical concerns  Chair/bed transfer assist level: Maximal Assistance - Patient 25 - 49% (stand pivot)     Locomotion Ambulation   Ambulation assist   Ambulation activity did not occur: Safety/medical concerns  Assist level: 2 helpers Assistive device: Hand held assist Max distance: 5   Walk 10 feet activity   Assist     Assist level: 2 helpers Assistive device: Hand held assist   Walk 50 feet activity   Assist Walk 50 feet with 2 turns activity did not occur: Safety/medical concerns  Walk 150 feet activity   Assist Walk 150 feet activity did not occur: Safety/medical concerns         Walk 10 feet on uneven surface  activity   Assist Walk 10 feet on uneven surfaces activity did not occur: Safety/medical concerns         Wheelchair     Assist Is the patient using a wheelchair?: No             Wheelchair 50 feet with 2 turns activity    Assist            Wheelchair 150 feet activity     Assist          Blood pressure 102/68, pulse 82, temperature 98.3 F (36.8 C), resp. rate 16, height 5\' 2"  (1.575 m), weight 98 kg, SpO2 100 %.  Medical Problem List and Plan: 1. 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  Impaired mobility: -DVT/anticoagulation:  Pharmaceutical: Continue Lovenox. Monitoring creatinine on Thursdays             -antiplatelet therapy: Aspirin 81 mg daily and Brilinta 90 mg twice daily 3. Pain Management:   Tylenol as needed for vascular headache  -not having pain currently 4. Depression/insomnia/agitation: Continue Zoloft 100 mg daily, melatonin 3 mg nightly             -antipsychotic agents: Seroquel 50 mg nightly for agitation  -mood remains positive  -will wean seroquel beginning Monday 12/12 5. Neuropsych: This patient is not fully capable of making decisions on his own behalf. 6. Skin/Wound Care: Routine skin checks 7. Fluids/Electrolytes/Nutrition: encourage PO  -hyponatremia 136 11/21 and again 12/5   -recheck labs Monday 8.  Hyperlipidemia.  Continue Lipitor 9.  History of alcohol and marijuana use.  Urine drug screen positive marijuana.  Counseling 10. Morbid Obesity- BMI 39.52- counseling and diet education 11.  Transaminitis  12/1 hep panel negative, HIV neg   improved on 12/2--recheck Monday 12/12 12. Mild leukocytosis: Resolved  Afebrile  -no s/s infection 14.  Slow transit constipation  -generally improved   -needs bowel movement today 12/9   -give sorbitol LOS: 22 days A FACE TO FACE EVALUATION WAS PERFORMED  14/9 08/27/2021, 8:24 AM

## 2021-08-27 NOTE — Progress Notes (Signed)
Speech Language Pathology Weekly Progress Note  Patient Details  Name: Roy [name] MRN: [id number] Date of Birth: 10/29/1995  Beginning of progress report period: August 20, 2021 End of progress report period: August 27, 2021   Short Term Goals: Week 3: SLP Short Term Goal 1 (Week 3): Patient will demonstrate sustained attention to functional tasks for ~25 minutes with Min verbal cues for redirection. SLP Short Term Goal 1 - Progress (Week 3): Met SLP Short Term Goal 2 (Week 3): Patient will self-monitor and correct verbal perseveration at the phrase level with Max A verbal and visual cues in 50% of opportunities. SLP Short Term Goal 2 - Progress (Week 3): Met SLP Short Term Goal 3 (Week 3): Patient will utilize diaphragmatic breathing at the phrase level with Min A multimodal cues. SLP Short Term Goal 3 - Progress (Week 3): Met SLP Short Term Goal 4 (Week 3): Patient will utilize speech intelligibility strategies at the phrase level with Mod A multimodal cues to achieve ~75% intelligibility. SLP Short Term Goal 4 - Progress (Week 3): Met SLP Short Term Goal 5 (Week 3): Patient will demonstrate functional problem solving for basic and familiar tasks with Min verbal cues. SLP Short Term Goal 5 - Progress (Week 3): Met    New Short Term Goals: Week 4: SLP Short Term Goal 1 (Week 4): STGs=LTGs due to ELOS  Weekly Progress Updates: Patient has made excellent gains and has met 5 of 5 STGs this reporting period. Currently, patient is overall Mod I for sustained attention and functional problem solving with functional and familiar tasks. Patient demonstrates improved speech intelligibility at the word and phrase level and is ~75% intelligible with Min verbal cues for use of speech intelligibility strategies. Min verbal cues are also needed to self-monitor and correct phonemic errors. Patient and family education ongoing. Patient would benefit from continued skilled SLP intervention to  maximize his functional communication prior to discharge.     Intensity: Minumum of 1-2 x/day, 30 to 90 minutes Frequency: 3 to 5 out of 7 days Duration/Length of Stay: 09/03/21 Treatment/Interventions: Internal/external aids;Speech/Language facilitation;Cueing hierarchy;Environmental controls;Therapeutic Activities;Functional tasks;Patient/family education    ,  08/27/2021, 6:40 AM       

## 2021-08-27 NOTE — Progress Notes (Signed)
Physical Therapy Session Note  Patient Details  Name: Roy [name] MRN: [id number] Date of Birth: 09/23/1995  Today's Date: 08/27/2021 PT Individual Time: 1003-1058 PT Individual Time Calculation (min): 55 min   Short Term Goals: Week 1:  PT Short Term Goal 1 (Week 1): Pt will perform bed mobility with modA PT Short Term Goal 1 - Progress (Week 1): Progressing toward goal PT Short Term Goal 2 (Week 1): Pt will perform sit to stand transfer with minA. PT Short Term Goal 2 - Progress (Week 1): Progressing toward goal PT Short Term Goal 3 (Week 1): Pt will perform bed to chair transfer with modA. PT Short Term Goal 3 - Progress (Week 1): Progressing toward goal PT Short Term Goal 4 (Week 1): Pt will ambulate x25' with modA +2. PT Short Term Goal 4 - Progress (Week 1): Progressing toward goal Week 2:  PT Short Term Goal 1 (Week 2): Pt will perform bed mobility consistently with modA PT Short Term Goal 1 - Progress (Week 2): Met PT Short Term Goal 2 (Week 2): Pt will perform sit to stand transfer consistently with minA. PT Short Term Goal 2 - Progress (Week 2): Progressing toward goal PT Short Term Goal 3 (Week 2): Pt will perform bed to chair transfer consistently with modA. PT Short Term Goal 3 - Progress (Week 2): Met PT Short Term Goal 4 (Week 2): Pt will ambulate x25' with modA +2. PT Short Term Goal 4 - Progress (Week 2): Met Week 3:  PT Short Term Goal 1 (Week 3): Pt will perform bed mobility consistently with minA. PT Short Term Goal 2 (Week 3): Pt will perform sit to stand consistently with minA. PT Short Term Goal 3 (Week 3): Pt will perform bed to chair consistently with minA. PT Short Term Goal 4 (Week 3): Pt will ambulate x25' with modA and LRAD.  Skilled Therapeutic Interventions/Progress Updates:   Pt initially oob in wc awaiting PT arrival. Transported to gym for session.  Sit to stand in parallel bars w/min assist, set up assist/cues for foot placement. In standing  worked on midline posture, tends to lean heavily to L but able to correct w/cues.  Worked on maintaining midline while alternating widestep to target/cone w/return to midline/tends to overshoot to narrow base/difficulty maintaining mindline w/bias to L lean.   Pt fatigues after 5-6 reps requiring seated rest.  Standing in parallel bars worked on controlling midline/core activation while attepting to pick up horshoes from R bar w/rhand, raising overhead, then returning to bar.  Completes x 4, x 10, x 6 before requiring seated rest.  Repeats 3 sets   Seated worked on transferring foam blocks from L hand to R hand + horizontal abd/add w shoulder at 90* to midline for transfer   Wc retropulsion to work on bipedal activity, coordination, mod assist for straight path d/t ataxic movment w/Les.    Seated worked on placing large pegs into large hole peg board, then transitioned to standing and worked on removing pegs from board while maintaining midline posture - able to stand x 4 min and complete task w/cga and cues for upright posture, RUE support on table.   At end of session, pt transported to dayroom and handed off to therapist for group session.  Therapy Documentation Precautions:  Precautions Precautions: Fall Precaution Comments: R hemi, diplopia/visual deficits Restrictions Weight Bearing Restrictions: No   Therapy/Group: Individual Therapy  , PT    M  08/27/2021, 12:15 PM  

## 2021-08-27 NOTE — Plan of Care (Signed)
Problem: RH Balance Goal: LTG: Patient will maintain dynamic sitting balance (OT) Description: LTG:  Patient will maintain dynamic sitting balance with assistance during activities of daily living (OT) Flowsheets (Taken 08/27/2021 1617) LTG: Pt will maintain dynamic sitting balance during ADLs with: (Goal downgraded based on slower progrress than originally expected.) Minimal Assistance - Patient > 75% Note: Goal downgraded based on slower progrress than originally expected Goal: LTG Patient will maintain dynamic standing with ADLs (OT) Description: LTG:  Patient will maintain dynamic standing balance with assist during activities of daily living (OT)  Flowsheets (Taken 08/27/2021 1617) LTG: Pt will maintain dynamic standing balance during ADLs with: (Goal downgraded based on slower progrress than originally expected) Maximal Assistance - Patient 25 - 49% Note: Goal downgraded based on slower progrress than originally expected   Problem: Sit to Stand Goal: LTG:  Patient will perform sit to stand in prep for activites of daily living with assistance level (OT) Description: LTG:  Patient will perform sit to stand in prep for activites of daily living with assistance level (OT) Flowsheets (Taken 08/27/2021 1617) LTG: PT will perform sit to stand in prep for activites of daily living with assistance level: (Goal downgraded based on slower progrress than originally expected) Moderate Assistance - Patient 50 - 74% Note: Goal downgraded based on slower progrress than originally expected   Problem: RH Bathing Goal: LTG Patient will bathe all body parts with assist levels (OT) Description: LTG: Patient will bathe all body parts with assist levels (OT) Flowsheets (Taken 08/27/2021 1617) LTG: Pt will perform bathing with assistance level/cueing: (Goal downgraded based on slower progrress than originally expected) Moderate Assistance - Patient 50 - 74% LTG: Position pt will perform bathing: Sit to  Stand Note: Goal downgraded based on slower progrress than originally expected   Problem: RH Dressing Goal: LTG Patient will perform upper body dressing (OT) Description: LTG Patient will perform upper body dressing with assist, with/without cues (OT). Flowsheets (Taken 08/27/2021 1617) LTG: Pt will perform upper body dressing with assistance level of: (Goal downgraded based on slower progrress than originally expected) Moderate Assistance - Patient 50 - 74% Note: Goal downgraded based on slower progrress than originally expected Goal: LTG Patient will perform lower body dressing w/assist (OT) Description: LTG: Patient will perform lower body dressing with assist, with/without cues in positioning using equipment (OT) Flowsheets (Taken 08/27/2021 1617) LTG: Pt will perform lower body dressing with assistance level of: (Goal downgraded based on slower progrress than originally expected) Maximal Assistance - Patient 25 - 49% Note: Goal downgraded based on slower progrress than originally expected   Problem: RH Toileting Goal: LTG Patient will perform toileting task (3/3 steps) with assistance level (OT) Description: LTG: Patient will perform toileting task (3/3 steps) with assistance level (OT)  Flowsheets (Taken 08/27/2021 1617) LTG: Pt will perform toileting task (3/3 steps) with assistance level: (Goal downgraded based on slower progrress than originally expected) Moderate Assistance - Patient 50 - 74% Note: Goal downgraded based on slower progrress than originally expected   Problem: RH Functional Use of Upper Extremity Goal: LTG Patient will use RT/LT upper extremity as a (OT) Description: LTG: Patient will use right/left upper extremity as a stabilizer/gross assist/diminished/nondominant/dominant level with assist, with/without cues during functional activity (OT) Flowsheets (Taken 08/27/2021 1617) LTG: Use of upper extremity in functional activities:  LUE as gross assist level  RUE as  gross assist level LTG: Pt will use upper extremity in functional activity with assistance level of: (Goal downgraded based on slower progrress  than originally expected) Supervision/Verbal cueing Note: Goal downgraded based on slower progrress than originally expected   Problem: RH Toilet Transfers Goal: LTG Patient will perform toilet transfers w/assist (OT) Description: LTG: Patient will perform toilet transfers with assist, with/without cues using equipment (OT) Flowsheets (Taken 08/27/2021 1617) LTG: Pt will perform toilet transfers with assistance level of: (Goal downgraded based on slower progrress than originally expected) Moderate Assistance - Patient 50 - 74%   Problem: RH Tub/Shower Transfers Goal: LTG Patient will perform tub/shower transfers w/assist (OT) Description: LTG: Patient will perform tub/shower transfers with assist, with/without cues using equipment (OT) Flowsheets (Taken 08/27/2021 1617) LTG: Pt will perform tub/shower stall transfers with assistance level of: (Goal downgraded based on slower progrress than originally expected) Moderate Assistance - Patient 50 - 74% LTG: Pt will perform tub/shower transfers from: Tub/shower combination Note: Goal downgraded based on slower progrress than originally expected

## 2021-08-28 ENCOUNTER — Encounter (HOSPITAL_COMMUNITY): Payer: Self-pay | Admitting: Physical Medicine & Rehabilitation
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
## 2021-08-29 NOTE — Progress Notes (Signed)
Speech Language Pathology Daily Session Note  Patient Details  Name: Roy [name] MRN: [id number] Date of Birth: 09/16/1996  Today's Date: 08/29/2021 SLP Individual Time: 5300-5110 SLP Individual Time Calculation (min): 45 min  Short Term Goals: Week 4: SLP Short Term Goal 1 (Week 4): STGs=LTGs due to ELOS  Skilled Therapeutic Interventions:   Pt was seen in room for ST treatment with focus on speech production for expressive communication. SLP facilitated increased intelligibility by focusing on articulatory placement cues for /s/ in all positions of words (initial, medial, final) using minimal pairs contrasts. Pt was observed to be ~80% intelligible with known context.  During single words, pt was able to monitor himself to produce /s/ with little to no instruction from SLP (after initial reminder). Pt also worked on producing short, functional phrases. Pt required modA verbal and visual cues to add pauses in between words to increase intelligibility. With multimodal cueing, pt was 100% intelligible. Pausing between words decreased articulatory breakdown, creating more clear speech. Pt was left in room, bed alarm activated, and all immediate needs within reach. Cont with current POC .  Pain Pain Assessment Pain Scale: 0-10 Pain Score: 7  Pain Location: Other (Comment) (stomach (related to constipation)) Pain Descriptors / Indicators: Aching;Cramping Pain Onset: On-going Pain Intervention(s): MD notified (Comment) (Doc was present)  Therapy/Group: Individual Therapy  Dorena Bodo, SLP 08/29/2021, 12:37 PM

## 2021-08-29 NOTE — Progress Notes (Signed)
Physical Therapy Session Note  Patient Details  Name: Roy [name] MRN: [id number] Date of Birth: [birth year]  Today's Date: 08/29/2021 PT Individual Time: 4081-4481 PT Individual Time Calculation (min): 38 min   Short Term Goals: Week 3:  PT Short Term Goal 1 (Week 3): Pt will perform bed mobility consistently with minA. PT Short Term Goal 2 (Week 3): Pt will perform sit to stand consistently with minA. PT Short Term Goal 3 (Week 3): Pt will perform bed to chair consistently with minA. PT Short Term Goal 4 (Week 3): Pt will ambulate x25' with modA and LRAD.  Skilled Therapeutic Interventions/Progress Updates:    Pt received sitting in w/c with his girlfriend present and it appears they may have been having an intense conversation, but they both redirected attention to therapist upon entering therefore difficult to determine. Pt agreeable to therapy session. Donned socks and tennis shoes with max assist with girlfriend's assistance for time management. [name] girlfriend says goodnight to pt and exits upon therapist transporting him down to therapy gym in w/c.  Pt reports that his girlfriend is "encouraging" and states she tells him to "be positive" but then confesses he has had a hard time staying positive today in particular and believes it is due to decreased therapy over the weekend.  Pt reports he feels like he is "alone" in this experience. Therapist provided emotional support and provided pt with [facility] Stroke Support Group pamphlet and pt very interested in attending a support group meeting.  Sit>stand w/c> B UE support on // bars with light to heavy min assist depending on whether or not pt performed sufficient anterior trunk lean because if not he would push backs of legs strongly against wheelchair (almost tipping it backwards) resulting in L LE full knee extension and ankle plantarflexion starting to roll into ankle inversion - cuing to move hands forward on // bars to promote  increased anterior trunk lean - cuing to ensure L foot backwards far enough on ground prior to initiating coming to stand.   Gait training length of // bars (~71ft) 2x with +2 w/c follow and able to ambulate ~11ft backwards on 1st set - requires mod assist for balance and step-by-step verbal, visual, and tactile cuing for R/L weight shifting onto stance limb, forward movement of hands on bars, and tactile cuing/manual facilitation for forward weight shifting of pelvis - responded well to cue to move hip towards the bar to promote full weight shift onto stance limb - demos significant incoordination in B LEs (L>R) during swing phase with L LE noticeably more impaired with pt landing on toes at initial contact with knee extended and inconsistent foot clearance, step length, and step width due to ataxia. When ambulating backwards pt requires heavier mod assist for balance due to anterior trunk lean with delayed posterior weight shifting - continued step-by-step verbal cuing for sequencing LE stepping, weight shifting, and moving hands along // bars - has L LE scissoring with pt responding well to verbal cue to keep it more towards the mirror.   Transported back to room and left seated tilted back partially in w/c with needs in reach and seat belt alarm on.   Therapy Documentation Precautions:  Precautions Precautions: Fall Precaution Comments: R hemi, diplopia/visual deficits Restrictions Weight Bearing Restrictions: No   Pain: Denies pain during session.   Therapy/Group: Individual Therapy  Ginny Forth , PT, DPT, NCS, CSRS 08/29/2021, 1:18 PM
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
## 2021-08-30 LAB — COMPREHENSIVE METABOLIC PANEL
ALT: 124 U/L — ABNORMAL HIGH (ref 0–44)
AST: 63 U/L — ABNORMAL HIGH (ref 15–41)
Albumin: 3.5 g/dL (ref 3.5–5.0)
Alkaline Phosphatase: 89 U/L (ref 38–126)
Anion gap: 9 (ref 5–15)
BUN: 14 mg/dL (ref 6–20)
CO2: 26 mmol/L (ref 22–32)
Calcium: 9.2 mg/dL (ref 8.9–10.3)
Chloride: 102 mmol/L (ref 98–111)
Creatinine, Ser: 0.92 mg/dL (ref 0.61–1.24)
GFR, Estimated: >60 mL/min (ref >60)
Glucose, Bld: 89 mg/dL (ref 70–99)
Potassium: 4.2 mmol/L (ref 3.5–5.1)
Sodium: 137 mmol/L (ref 135–145)
Total Bilirubin: 0.7 mg/dL (ref 0.3–1.2)
Total Protein: 6.9 g/dL (ref 6.5–8.1)

## 2021-08-30 LAB — CBC
HCT: 37.8 % — ABNORMAL LOW (ref 39.0–52.0)
Hemoglobin: 12.6 g/dL — ABNORMAL LOW (ref 13.0–17.0)
MCH: 30.4 pg (ref 26.0–34.0)
MCHC: 33.3 g/dL (ref 30.0–36.0)
MCV: 91.3 fL (ref 80.0–100.0)
Platelets: 355 10*3/uL (ref 150–400)
RBC: 4.14 MIL/uL — ABNORMAL LOW (ref 4.22–5.81)
RDW: 13.3 % (ref 11.5–15.5)
WBC: 9.3 10*3/uL (ref 4.0–10.5)
nRBC: 0 % (ref 0.0–0.2)

## 2021-08-30 MED ORDER — QUETIAPINE FUMARATE 25 MG PO TABS
25.0000 mg | ORAL_TABLET | Freq: Every day | ORAL | Status: DC
  Administered 2021-08-30 – 2021-09-01 (×3): 25 mg via ORAL
  Filled 2021-08-30 (×3): qty 1

## 2021-08-30 NOTE — Progress Notes (Signed)
Speech Language Pathology Daily Session Note  Patient Details  Name: Roy [name] MRN: [id number] Date of Birth: 04/15/1996  Today's Date: 08/30/2021 SLP Individual Time: 6761-9509 SLP Individual Time Calculation (min): 40 min  Short Term Goals: Week 4: SLP Short Term Goal 1 (Week 4): STGs=LTGs due to ELOS  Skilled Therapeutic Interventions: Skilled treatment session focused on communication goals. SLP facilitated session by providing extra time and overall Min verbal cues for patient to self-monitor and correct phonemic errors at the sentence level. Patient was ~80% accurate during a structured language barrier task at the sentence level. [name]. Continue with current plan of care.      Pain Pain Assessment Pain Scale: 0-10 Pain Score: 0-No pain  Therapy/Group: Individual Therapy  Roy [name] 08/30/2021, 12:02 PM

## 2021-08-30 NOTE — Progress Notes (Signed)
PROGRESS NOTE   Subjective/Complaints:  Pt up in shower. No new complaints. Happy that he's improving. No pain  ROS: Patient denies fever, rash, sore throat, blurred vision, nausea, vomiting, diarrhea, cough, shortness of breath or chest pain, joint or back pain, headache, or mood change.    Objective:   No results found. Recent Labs    08/30/21 0557  WBC 9.3  HGB 12.6*  HCT 37.8*  PLT 355     Recent Labs    08/30/21 0557  NA 137  K 4.2  CL 102  CO2 26  GLUCOSE 89  BUN 14  CREATININE 0.92  CALCIUM 9.2      Intake/Output Summary (Last 24 hours) at 08/30/2021 1035 Last data filed at 08/30/2021 0851 Gross per 24 hour  Intake 360 ml  Output 700 ml  Net -340 ml        Physical Exam: Vital Signs Blood pressure 90/61, pulse 90, temperature 98 F (36.7 C), temperature source Oral, resp. rate 18, height 5\' 2"  (1.575 m), weight 98 kg, SpO2 100 %.    Constitutional: No distress . Vital signs reviewed. HEENT: NCAT, EOMI, oral membranes moist Neck: supple Cardiovascular: RRR without murmur. No JVD    Respiratory/Chest: CTA Bilaterally without wheezes or rales. Normal effort    GI/Abdomen: BS +, non-tender, non-distended Ext: no clubbing, cyanosis, or edema Psych: pleasant and cooperative  Skin: Warm and dry.  Intact.. Musc: No edema in extremities.  No tenderness in extremities. Neuro: Alert Speech continues to improve, breathing better with speech  Motor: RUE 5/5, RLE 4-5/5 LUE and LLE 3+ to 4/5- persistent but improved limb ataxia with FTN/HTS  Assessment/Plan: 1. Functional deficits which require 3+ hours per day of interdisciplinary therapy in a comprehensive inpatient rehab setting. Physiatrist is providing close team supervision and 24 hour management of active medical problems listed below. Physiatrist and rehab team continue to assess barriers to discharge/monitor patient progress toward  functional and medical goals  Care Tool:  Bathing    Body parts bathed by patient: Chest, Abdomen   Body parts bathed by helper: Buttocks     Bathing assist Assist Level: Moderate Assistance - Patient 50 - 74%     Upper Body Dressing/Undressing Upper body dressing   What is the patient wearing?: Pull over shirt    Upper body assist Assist Level: Maximal Assistance - Patient 25 - 49%    Lower Body Dressing/Undressing Lower body dressing      What is the patient wearing?: Incontinence brief     Lower body assist Assist for lower body dressing: Maximal Assistance - Patient 25 - 49%     Toileting Toileting    Toileting assist Assist for toileting: Total Assistance - Patient < 25%     Transfers Chair/bed transfer  Transfers assist  Chair/bed transfer activity did not occur: Safety/medical concerns  Chair/bed transfer assist level: Maximal Assistance - Patient 25 - 49% (stand pivot)     Locomotion Ambulation   Ambulation assist   Ambulation activity did not occur: Safety/medical concerns  Assist level: 2 helpers (min/mod A of 1 & +2 w/c follow) Assistive device: Parallel bars Max distance: 26ft   Walk  10 feet activity   Assist     Assist level: 2 helpers Assistive device: Hand held assist   Walk 50 feet activity   Assist Walk 50 feet with 2 turns activity did not occur: Safety/medical concerns         Walk 150 feet activity   Assist Walk 150 feet activity did not occur: Safety/medical concerns         Walk 10 feet on uneven surface  activity   Assist Walk 10 feet on uneven surfaces activity did not occur: Safety/medical concerns         Wheelchair     Assist Is the patient using a wheelchair?: No             Wheelchair 50 feet with 2 turns activity    Assist            Wheelchair 150 feet activity     Assist          Blood pressure 90/61, pulse 90, temperature 98 F (36.7 C), temperature source  Oral, resp. rate 18, height 5\' 2"  (1.575 m), weight 98 kg, SpO2 100 %.  Medical Problem List and Plan: 1. 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  Impaired mobility: -DVT/anticoagulation:  Pharmaceutical: Continue Lovenox. Monitoring creatinine on Thursdays             -antiplatelet therapy: Aspirin 81 mg daily and Brilinta 90 mg twice daily 3. Pain Management:   Tylenol as needed for vascular headache  12/11- no HA's pain- con't tylenol prn 4. Depression/insomnia/agitation: Continue Zoloft 100 mg daily, melatonin 3 mg nightly             -antipsychotic agents: Seroquel 50 mg nightly for agitation  -mood remains positive  -will wean seroquel beginning Monday 12/12  12/12 decrease seroquel to 25 mg xr qhs 5. Neuropsych: This patient is not fully capable of making decisions on his own behalf. 6. Skin/Wound Care: Routine skin checks 7. Fluids/Electrolytes/Nutrition: encourage PO  -hyponatremia 137 12/12   - all labs reviewed 8.  Hyperlipidemia.  Continue Lipitor 9.  History of alcohol and marijuana use.  Urine drug screen positive marijuana.  Counseling 10. Morbid Obesity- BMI 39.52- counseling and diet education 11.  Transaminitis  12/1 hep panel negative, HIV neg   LFT's stable, still sl elevated---f/u as outpt 12. Mild leukocytosis: Resolved  Afebrile  -no s/s infection 14.  Slow transit constipation  -moved bowels over weekend   LOS: 25 days A FACE TO FACE EVALUATION WAS PERFORMED  14/1 08/30/2021, 10:35 AM

## 2021-08-30 NOTE — Progress Notes (Signed)
Physical Therapy Weekly Progress Note  Patient Details  Name: Roy [name] MRN: [id number] Date of Birth: Oct 07, 1995  Beginning of progress report period: August 20, 2021 End of progress report period: August 30, 2021  Today's Date: 08/30/2021 PT Individual Time: 1103-1200 PT Individual Time Calculation (min): 57 min   Patient has met 0 of 4 short term goals.  Pt is progressing toward mobility goals, but has not met any of his short term goals for this recording session due to inconsistency with motor planning, affecting balance and coordination. Pt has made progress and has improved coordination and independence with functional mobility, but severity of multiple strokes have made progress slow at times. Pt can perform mobility tasks with as little as minA, but frequently requires modA to maxA due to poor motor planning and sequencing of task. Pt has had family education and will continue to benefit from therapy to address deficits to prepare for safe DC.  Patient continues to demonstrate the following deficits muscle weakness, decreased cardiorespiratoy endurance, impaired timing and sequencing, abnormal tone, unbalanced muscle activation, motor apraxia, ataxia, decreased coordination, and decreased motor planning, and decreased sitting balance, decreased standing balance, decreased postural control, and decreased balance strategies and therefore will continue to benefit from skilled PT intervention to increase functional independence with mobility.  Patient progressing toward long term goals..  Continue plan of care.  PT Short Term Goals Week 3:  PT Short Term Goal 1 (Week 3): Pt will perform bed mobility consistently with minA. PT Short Term Goal 1 - Progress (Week 3): Progressing toward goal PT Short Term Goal 2 (Week 3): Pt will perform sit to stand consistently with minA. PT Short Term Goal 2 - Progress (Week 3): Progressing toward goal PT Short Term Goal 3 (Week 3): Pt will  perform bed to chair consistently with minA. PT Short Term Goal 3 - Progress (Week 3): Progressing toward goal PT Short Term Goal 4 (Week 3): Pt will ambulate x25' with modA and LRAD. PT Short Term Goal 4 - Progress (Week 3): Progressing toward goal Week 4:  PT Short Term Goal 1 (Week 4): STGs=LTGs  Skilled Therapeutic Interventions/Progress Updates:     Pt received seated in WC and agrees to therapy. No complaint of pain. WC transport to gym for time management. Pt performs sit to stand and stand step transfer to mat with modA and multimodal cues for weight shifting and step-by-step sequencing. Pt performs multiple bouts of ambulation during session, primarily without AD and with modA +2, x25' and x50'. PT provides manual facilitation of weight shifting laterally and anteriorly over each stance leg, and provides multimodal cues for step length and placement to adequately support weight. Cues as well to engage hip abductors on stance leg to prevent internal rotation and adduction during stance phase. Pt attempts x1 bout of ambulation with EVA walker and has poorer gait mechanics with walker than without, and also verbalizes L arm fatigue when ambulating 15'. Pt places a lot of weight through EVA walker but does not adequately shift weight in lower extremities for functional ambulation. WC transport back to room. Pt left seated with alarm intact and all needs within reach.  Therapy Documentation Precautions:  Precautions Precautions: Fall Precaution Comments: R hemi, diplopia/visual deficits Restrictions Weight Bearing Restrictions: No  Therapy/Group: Individual Therapy  Breck Coons, PT, DPT 08/30/2021, 4:22 PM

## 2021-08-30 NOTE — Progress Notes (Signed)
Occupational Therapy Session Note  Patient Details  Name: Roy [name] MRN: [id number] Date of Birth: 14-May-1996  Today's Date: 08/30/2021 Session 1 OT Individual Time: 9068-9340 OT Individual Time Calculation (min): 56 min   Session 2 OT Individual Time: 6840-3353 OT Individual Time Calculation (min): 30 min    Short Term Goals: Week 4:  OT Short Term Goal 1 (Week 4): Continue working on established LTGs downgraded to mod assist overall.  Skilled Therapeutic Interventions/Progress Updates:  Session 1   Pt greeted semi-reclined in bed with girlfriend present and agreeable to OT treatment session. Increased time for bed mobility today with verbal and tactile cues for initiation and head/hips relationship for scooting. Worked on stand-pivot transfers today to get in and out of shower. Discussed with gf starting hands on training this week. Pt needed mod A for stand-pivot transfers with facilitation for pivot and increased time to initiate and motor plan transitional movements. Bathing completed from shower bench with improved strength and coordination using B UE's to bathe for neuro re-ed. Sit<>stand in shower using grab bar and min A, while OT assisted with washing  buttocks. Pt able to use LH sponge to wash lower legs with set-up A. Blocked practice for dressing techniques OT threading head first, then pt able to integrate b UE,s but still needing max A for dressing tasks. Sit<>stand at the sink with mod A, then min A for balance while tech traded out wheelchair. Pt then performed grooming tasks using L hand to brush teeth with set-up A. Pt left seated in wc with alarm belt on, call bell in reach, and needs met.  Denies pain  Session 2 Pt greeted seated in wc and agreeable to OT treatment session. Pt brought to therapy gym and performed squat-pivot with 3 trials to power up, but then mod A to pivot. Pt brought into supine with wedge placed under back. Pt completed 3 sets of 10 chest press,  straight arm raise, and triceps overhead press. Pt able to maintain grasp on 1 lb dowel rod and only needed shoulder support with triceps extensions. Pt with much more control with bimanual task. Pt needed mod A +2 to pivot back to wc. Pt brought back to room and left seated in wc with alarm belt on, call bell in reach, and needs met.   Therapy Documentation Precautions:  Precautions Precautions: Fall Precaution Comments: R hemi, diplopia/visual deficits Restrictions Weight Bearing Restrictions: No  Pain:  Denies pain   Therapy/Group: Individual Therapy  Valma Cava 08/30/2021, 2:55 PM

## 2021-08-31 NOTE — Progress Notes (Signed)
Speech Language Pathology Daily Session Note  Patient Details  Name: Demontray Franta MRN: [id number] Date of Birth: Apr 25, 1996  Today's Date: 08/31/2021 SLP Individual Time: 1430-1500 SLP Individual Time Calculation (min): 30 min  Short Term Goals: Week 4: SLP Short Term Goal 1 (Week 4): STGs=LTGs due to ELOS  Skilled Therapeutic Interventions: Skilled treatment session focused on communication goals and caregiver education with the patient's girlfriend. SLP facilitated session by providing supervision level verbal cues to self-monitor and correct phonemic errors at the word level while while producing multisyllabic words to achieve ~90% intelligibility. Patient's girlfriend present and educated regarding speech intelligibility strategies and appropriate cueing. Both verbalized understanding. Patient left upright in bed with alarm on and all needs within reach. Continue with current plan of care.      Pain No/Denies Pain   Therapy/Group: Individual Therapy  Perry Molla 08/31/2021, 3:11 PM

## 2021-08-31 NOTE — Progress Notes (Signed)
Patient ID: Roy [name], male   DOB: [birth year], 25 y.o.   MRN: [id number]  SW met with pt and pt girlfriend Darleen Crocker in the room to provide updates from team conference, and gains made in rehab where now his d/c date is now 12/23 instead of 12/16. Pt in agreement. SW discussed if there were any updates on DME. Darleen Crocker stated she was not sure. She also state that his sister may not be here today because of childcare challenges. SW shared will explore a charity HHA next week since that is his discharge week.   Loralee Pacas, MSW, [place] Office: [phone] Cell: [phone] Fax: [phone]

## 2021-08-31 NOTE — Progress Notes (Signed)
PROGRESS NOTE   Subjective/Complaints:  In good spirits. Denies pain.   ROS: Patient denies fever, rash, sore throat, blurred vision, nausea, vomiting, diarrhea, cough, shortness of breath or chest pain, joint or back pain, headache, or mood change.   Objective:   No results found. Recent Labs    08/30/21 0557  WBC 9.3  HGB 12.6*  HCT 37.8*  PLT 355     Recent Labs    08/30/21 0557  NA 137  K 4.2  CL 102  CO2 26  GLUCOSE 89  BUN 14  CREATININE 0.92  CALCIUM 9.2      Intake/Output Summary (Last 24 hours) at 08/31/2021 1214 Last data filed at 08/31/2021 0900 Gross per 24 hour  Intake 480 ml  Output 500 ml  Net -20 ml        Physical Exam: Vital Signs Blood pressure 102/63, pulse 68, temperature 97.7 F (36.5 C), temperature source Oral, resp. rate 18, height 5\' 2"  (1.575 m), weight 98 kg, SpO2 100 %.  Constitutional: No distress . Vital signs reviewed. HEENT: NCAT, EOMI, oral membranes moist Neck: supple Cardiovascular: RRR without murmur. No JVD    Respiratory/Chest: CTA Bilaterally without wheezes or rales. Normal effort    GI/Abdomen: BS +, non-tender, non-distended Ext: no clubbing, cyanosis, or edema Psych: pleasant and cooperative  Skin: Warm and dry.  Intact.. Musc: No edema in extremities.  No tenderness in extremities. Neuro: Alert Speech dysarthric  Motor: RUE 5/5, RLE 4-5/5 LUE and LLE 3+ to 4/5- persistent, improving limb ataxia with FTN/HTS  Assessment/Plan: 1. Functional deficits which require 3+ hours per day of interdisciplinary therapy in a comprehensive inpatient rehab setting. Physiatrist is providing close team supervision and 24 hour management of active medical problems listed below. Physiatrist and rehab team continue to assess barriers to discharge/monitor patient progress toward functional and medical goals  Care Tool:  Bathing    Body parts bathed by patient:  Chest, Abdomen   Body parts bathed by helper: Buttocks     Bathing assist Assist Level: Moderate Assistance - Patient 50 - 74%     Upper Body Dressing/Undressing Upper body dressing   What is the patient wearing?: Pull over shirt    Upper body assist Assist Level: Maximal Assistance - Patient 25 - 49%    Lower Body Dressing/Undressing Lower body dressing      What is the patient wearing?: Incontinence brief     Lower body assist Assist for lower body dressing: Maximal Assistance - Patient 25 - 49%     Toileting Toileting    Toileting assist Assist for toileting: Total Assistance - Patient < 25%     Transfers Chair/bed transfer  Transfers assist  Chair/bed transfer activity did not occur: Safety/medical concerns  Chair/bed transfer assist level: Maximal Assistance - Patient 25 - 49% (stand pivot)     Locomotion Ambulation   Ambulation assist   Ambulation activity did not occur: Safety/medical concerns  Assist level: 2 helpers (min/mod A of 1 & +2 w/c follow) Assistive device: Parallel bars Max distance: 37ft   Walk 10 feet activity   Assist     Assist level: 2 helpers Assistive device: Hand  held assist   Walk 50 feet activity   Assist Walk 50 feet with 2 turns activity did not occur: Safety/medical concerns         Walk 150 feet activity   Assist Walk 150 feet activity did not occur: Safety/medical concerns         Walk 10 feet on uneven surface  activity   Assist Walk 10 feet on uneven surfaces activity did not occur: Safety/medical concerns         Wheelchair     Assist Is the patient using a wheelchair?: No             Wheelchair 50 feet with 2 turns activity    Assist            Wheelchair 150 feet activity     Assist          Blood pressure 102/63, pulse 68, temperature 97.7 F (36.5 C), temperature source Oral, resp. rate 18, height 5\' 2"  (1.575 m), weight 98 kg, SpO2 100 %.  Medical  Problem List and Plan: 1. 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 Interdisciplinary team conference today to discuss goals, barriers to discharge, and dc planning.   2.  Impaired mobility: -DVT/anticoagulation:  Pharmaceutical: Continue Lovenox. Monitoring creatinine on Thursdays             -antiplatelet therapy: Aspirin 81 mg daily and Brilinta 90 mg twice daily 3. Pain Management:   Tylenol as needed for vascular headache  12/13- no HA's pain- con't tylenol prn 4. Depression/insomnia/agitation: Continue Zoloft 100 mg daily, melatonin 3 mg nightly             -antipsychotic agents: Seroquel 50 mg nightly for agitation  -mood remains positive  -will wean seroquel beginning Monday 12/12  12/12 decreased seroquel to 25 mg xr qhs---no problems-may dc 12/14 5. Neuropsych: This patient is not fully capable of making decisions on his own behalf. 6. Skin/Wound Care: Routine skin checks 7. Fluids/Electrolytes/Nutrition: encourage PO  -hyponatremia 137 12/12   - all labs reviewed 8.  Hyperlipidemia.  Continue Lipitor 9.  History of alcohol and marijuana use.  Urine drug screen positive marijuana.  Counseling 10. Morbid Obesity- BMI 39.52- counseling and diet education 11.  Transaminitis  12/1 hep panel negative, HIV neg   LFT's stable, still sl elevated---f/u as outpt 12. Mild leukocytosis: Resolved  Afebrile  -no s/s infection 14.  Slow transit constipation  -moved bowels over weekend   LOS: 26 days A FACE TO FACE EVALUATION WAS PERFORMED  14/1 08/31/2021, 12:14 PM

## 2021-08-31 NOTE — Progress Notes (Signed)
Physical Therapy Session Note  Patient Details  Name: Roy [name] MRN: [id number] Date of Birth: 1995-09-29  Today's Date: 08/31/2021 PT Individual Time: [phone] and 1347-1430 PT Individual Time Calculation (min): 57 min and 43 min  Short Term Goals: Week 4:  PT Short Term Goal 1 (Week 4): STGs=LTGs  Skilled Therapeutic Interventions/Progress Updates:     1st Session: Pt received seated in WC and agrees to therapy. Reports pain in L shoulder during session. PT provides mobility and rest breaks to manage pain. WC transport to gym for time management. Fleet Contras from [facility] present for [facility] evaluation. Pt fitted for [facility] chair and PT provides information on pt's functional deficits and mobility status.   Pt performs gait training in parallel bars. Multiple reps of sit to stand with bilateral upper extremity grasp on bars, with CGA and cues for anterior weight shift and body mechanics. Mirror provided for visual feedback. Pt ambulates forward and backward x5' with minA, with tactile cueing at trunk for extension and optimal posture, as well as step-by-step verbal cueing for weight shifting and contralateral foot progression. Pt completes x3 more bouts of ambulation, with extended seated rest breaks in between, and requires up to [facility] for balance as fatigue increases. PT also provides increased cueing for progression of COG over stance leg as pt tends to keep hips posteriorly oriented and uses bilateral grip to maintain upright balance.   WC transport back to room. Left seated in WC with alarm intact and all needs within reach.  2nd Session: Pt received seated in [facility] and agrees to therapy. No complaint of pain. Girlfriend present for family education and OT present for skilled cotreat. Pt performs forward "crawling" transfer onto mat table. MinA +2 for sit to stand and mod A+2 to transfer onto mat, with PT facilitating lateral weight shifting and trunk extension for safe  completion. Pt assisted into tall kneeling with bilateral upper extremities on platform bench and mirror provided for visual feedback. In tall kneeling, pt reaches alternating hands across body toward targets to encourage core activation, rotation, and coordination, as well as WB through arms for NM feedback. Multimodal cueing for hip extension and positioning of "stance arm" on platform for stability. Pt transitiones into balancing in tall kneeling with no upper extremity support for several seconds at a time with CGA. Pt takes extended seated rest break, then assisted into quadruped and performs similar activity, reaching with alternating arms to encourage core activation and contralateral upper extremity WB. Pt takes rest break in prone and then presses back up into quadruped with minA +1, then down onto elbows to work on coordination and thoracic extension. Pt assisted back to seated position at edge of mat. PT and OT provide education on safe transfers and pt's girlfriend assists pt with performs stand step transfer, requiring minA/modA from OT for safety. PT then demonstrates safe body mechanics and assists pt with additional transfer, requiring minA/modA +1. Pt handed of to SLP.   Therapy Documentation Precautions:  Precautions Precautions: Fall Precaution Comments: R hemi, diplopia/visual deficits Restrictions Weight Bearing Restrictions: No    Therapy/Group: Individual Therapy  Beau Fanny, PT, DPT 08/31/2021, 3:54 PM

## 2021-08-31 NOTE — Progress Notes (Signed)
Occupational Therapy Session Note  Patient Details  Name: Roy [name] MRN: [id number] Date of Birth: 1995-09-22  Today's Date: 08/31/2021 Session 1 OT Individual Time: 0277-4128 OT Individual Time Calculation (min): 28 min   Session 2 OT Individual Time: 1300-1345 OT Individual Time Calculation (min): 45 min    Short Term Goals: Week 3:  OT Short Term Goal 1 (Week 3): Patient will complete 1 step of UB dressing task OT Short Term Goal 1 - Progress (Week 3): Met OT Short Term Goal 2 (Week 3): Pt will stand at the sink with mod A of 1 person in preparation for BADL task OT Short Term Goal 2 - Progress (Week 3): Not met OT Short Term Goal 3 (Week 3): Patient will use R UE to grasp toothbrush while applying toothpaste with L hand and overall mod A OT Short Term Goal 3 - Progress (Week 3): Not met  Skilled Therapeutic Interventions/Progress Updates:  Session 1   Pt greeted semi-reclined in bed and agreeable to OT treatment session. Pt able to complete bed mobility with increased time and min A. Worked on sitting balance at EOB and anterior weight shift with LB dressing task. Pt demonstrated improved trunk control and B LE activation to maintain sitting balance while lifting unilateral LE to place in leg hole. Pt then able to reach down and try to pull pants up towards knees with min A for sitting balance, still needed mod/max A to complete task. Sit<>stands from EOB using RW. Pt with tendency for L hand to skip off of RW handle due to poor proprioception and ataxia, but much improved from previous attempts. Pt was able to get to standing with min A, then needed OT assist to pull up pants. Worked on pivot to wc with increased time, facilitation for weight shifting and verbal/tactile cues to take small steps. Overall mod/max A for transfer but pt in more control overall. Pt completed 3 more sit<>stands with RW from wc with min/mod A. Pt left seated in wc handoff to PT for next therapy session.    Session 2 Pt greeted seated In wc with girlfriend present for family education and case manager giving updates on plan of care. [name] sister not present for family education as she does not have childcare available. Pt brought to therapy gym and worked on sit<>stands with anterior weight shift at high-low table. Addressed standing balance/endurance, while also integrate UE fine motor control with large peg board pattern. Pt able to reach into container and collect pegs. Worked on advancing grasp from palmer grasp to more thumb-forefinger grasp on large pegs. Took seated rest breaks, then worked on L hand fine motor with pt having increased difficulty coordinating grasp due to ataxia on L side. [name] girlfriend then assisted with sit<>stands with education provided for body mechanics, safety awareness, and providing appropriate cues. OT handoff to PT where OT provided +2 skilled Cotreat focused on neuro re-ed in quadruped positioning on therapy mat.   Therapy Documentation Precautions:  Precautions Precautions: Fall Precaution Comments: R hemi, diplopia/visual deficits Restrictions Weight Bearing Restrictions: No Pain:  Denies pain   Therapy/Group: Individual Therapy  Valma Cava 08/31/2021, 2:47 PM

## 2021-08-31 NOTE — Progress Notes (Signed)
Education provided to patient and partner who is at bedside. Discussed medications, stroke prevention, dietary modifications and safety precautions at home. Pt and partner verbalize understanding. Partner has question regarding home transport and finding primary care provider. Message left for SW.    Marylu Lund, RN

## 2021-09-01 ENCOUNTER — Encounter (HOSPITAL_COMMUNITY): Payer: Self-pay | Admitting: Physical Medicine & Rehabilitation

## 2021-09-01 NOTE — Progress Notes (Signed)
Physical Therapy Session Note  Patient Details  Name: Roy [name] MRN: [id number] Date of Birth: 08-14-1996  Today's Date: 09/01/2021 PT Individual Time: 930-955     Short Term Goals:  Week 4:  PT Short Term Goal 1 (Week 4): STGs=LTGs  Skilled Therapeutic Interventions/Progress Updates:   Pt received sitting in WC and agreeable to PT. WC mobility with BLE x 1f with min assist. Attempted to incorperate BUE< but unable to coordinate BUE/BLE effectively, noted to have poor grasp on  LUE. PT applied tband to wheel rims to improve friction and allow increased grasp on the LLE. Pt performed reciprocal marches and LAQ 2 x 10 while PT applied tband. BUE WC propulsion x 579fwith min-mod assist and moderate cues for attention to the LUE to prevent veer to the L. [facility] returned to [facility] reach and all needs met.         Therapy Documentation Precautions:  Precautions Precautions: Fall Precaution Comments: R hemi, diplopia/visual deficits Restrictions Weight Bearing Restrictions: No  Pain: denies    Therapy/Group: Individual Therapy  AuLorie Phenix2/14/2022, 8:46 AM

## 2021-09-01 NOTE — Progress Notes (Signed)
Speech Language Pathology Daily Session Note  Patient Details  Name: Roy [name] MRN: [id number] Date of Birth: [birth year]  Today's Date: 09/01/2021 SLP Individual Time: 4239-5320 SLP Individual Time Calculation (min): 39 min  Short Term Goals: Week 4: SLP Short Term Goal 1 (Week 4): STGs=LTGs due to ELOS  Skilled Therapeutic Interventions: Skilled treatment session focused on communication goals. SLP facilitated session by providing overall Min A verbal cues for patient to self-monitor and correct speech errors at sentence level during an informal language task. Patient appeared to demonstrate decreased intelligibility today with an increased speech rate and vocal strain. [name]. Continue with current plan of care.      Pain No/Denies Pain   Therapy/Group: Individual Therapy  Roy [name] 09/01/2021, 3:53 PM

## 2021-09-01 NOTE — Progress Notes (Signed)
Recreational Therapy Session Note  Patient Details  Name: Theus Espin MRN: [id number] Date of Birth: 1995/09/27 Today's Date: 09/01/2021  Pain:no c/o  Pt participated in animal assisted activity seated w/c level petting the dog alternately with UEs, min questioning cues for intelligeable speech with conversation.  Pt appreciative of this visit. Visitor present in the room observing. Shaneta Cervenka 09/01/2021, 4:09 PM

## 2021-09-01 NOTE — Progress Notes (Signed)
Patient ID: Roy [name], male   DOB: 03/20/1996, 25 y.o.   MRN: [id number]  SW left message for pt sister Roy [name] to provide updates from team conference, and d/c date is now 12/23. SW also inquired if there was an update on any of the DME he needed, and informed on specialty w/c being a [facility] w/c; and charity [facility]HA will be explored next week. SW informed they were still down for family edu tomorrow 9am-2pm. SW waiting on follow-up.  Cecile Sheerer, MSW, LCSWA Office: [phone] Cell: [phone] Fax: [phone]

## 2021-09-01 NOTE — Patient Care Conference (Addendum)
Inpatient RehabilitationTeam Conference and Plan of Care Update Date: 08/31/2021   Time: 10:33 AM    Patient Name: Roy [name]      Medical Record Number: [id number]  Date of Birth: 24-Jan-1996 Sex: Male         Room/Bed: 4W20C/4W20C-01 Payor Info: Payor: MEDICAID POTENTIAL / Plan: MEDICAID POTENTIAL / Product Type: *No Product type* /    Admit Date/Time:  08/05/2021  2:14 PM  Primary Diagnosis:  Posterior circulation stroke [facility])  Hospital Problems: Principal Problem:   Posterior circulation stroke (HCC) Active Problems:   Leukocytosis   Transaminitis   Dyslipidemia   Hyponatremia   Vascular headache   Sleep disturbance   Slow transit constipation    Expected Discharge Date: Expected Discharge Date: 09/10/21  Team Members Present: Physician leading conference: Dr. Faith Rogue Social Worker Present: Cecile Sheerer, LCSWA Nurse Present: Kennyth Arnold, RN PT Present: Malachi Pro, PT OT Present: Kearney Hard, OT SLP Present: Feliberto Gottron, SLP     Current Status/Progress Goal Weekly Team Focus  Bowel/Bladder   continent B/B  Maintain continence  continue time toileting   Swallow/Nutrition/ Hydration             ADL's   Mod A transfers, can stand with as little as min A, but more consistently mod A, B UE function is greatly improving  Min A  self-care retraining, dc planning, pt/family education, B UE NMR, fine motor control, motor planning   Mobility   MinA bed mobility, minA sit to stand, modA +2 HHA x60' ambulation  minA  ambulation, WC training, family ed, transfers, balance training   Communication   Min A-80% intelligibile at the sentence level  Min A  use of over-articulation, a slow rate, easy onset, diaphragmatic breathing, error awareness   Safety/Cognition/ Behavioral Observations            Pain   no pain reported  Remain pain free.  assess pain q 4hr and prn   Skin   CDI  Prevent new breakdowns.  assess skin q shift and prn      Discharge Planning:  Pt is uninsured. Pt to d/c to home with his sister who does not work. Support from other family as well. Family meeting occured on 12/9 at 10am.Fam edu scheduled for Tuesday (12/13) 1pm-3:30pm and Thurs (9/15) 9am-2pm. family intends to begin looking for DME. SW will confirm type of specialty w/c.   Team Discussion: Medically ready for discharge. Adjusting medications. Family education scheduled for this week. Improvement in using both upper extremities. Stands with min assist. Can be min assist in parallel bars, mod +2 out of parallel bars. Doing well with SLP. Request to extend stay due to improvement. No current nursing issues.   Patient on target to meet rehab goals: yes, Min assist goals  *See Care Plan and progress notes for long and short-term goals.   Revisions to Treatment Plan:  MD adjusting medications, finalizing discharge plans, and extended stay and additional week.   Teaching Needs: Family education, medication management, safety awareness, transfer/gait training, etc.  Current Barriers to Discharge: Home enviroment access/layout, Lack of/limited family support, Weight, Medication compliance, and Behavior  Possible Resolutions to Barriers: Family education Order DME Follow up PT/OT/SLP     Medical Summary Current Status: improving speech and ataxia. labs stable, behavior controlled  Barriers to Discharge: Medical stability   Possible Resolutions to [employer] Focus: daily assessment of labs and patient data. tapering meds to simplify home regimen   Continued Need  for Acute Rehabilitation Level of Care: The patient requires daily medical management by a physician with specialized training in physical medicine and rehabilitation for the following reasons: Direction of a multidisciplinary physical rehabilitation program to maximize functional independence : Yes Medical management of patient stability for increased activity during  participation in an intensive rehabilitation regime.: Yes Analysis of laboratory values and/or radiology reports with any subsequent need for medication adjustment and/or medical intervention. : Yes   I attest that I was present, lead the team conference, and concur with the assessment and plan of the team.   Kennyth Arnold G 09/01/2021, 10:14 AM

## 2021-09-01 NOTE — Progress Notes (Signed)
Occupational Therapy Session Note  Patient Details  Name: Roy [name] MRN: [id number] Date of Birth: 08-10-1996  Today's Date: 09/01/2021 OT Individual Time: 5391-2258 OT Individual Time Calculation (min): 74 min    Short Term Goals: Week 3:  OT Short Term Goal 1 (Week 3): Patient will complete 1 step of UB dressing task OT Short Term Goal 1 - Progress (Week 3): Met OT Short Term Goal 2 (Week 3): Pt will stand at the sink with mod A of 1 person in preparation for BADL task OT Short Term Goal 2 - Progress (Week 3): Not met OT Short Term Goal 3 (Week 3): Patient will use R UE to grasp toothbrush while applying toothpaste with L hand and overall mod A OT Short Term Goal 3 - Progress (Week 3): Not met Week 4:  OT Short Term Goal 1 (Week 4): Continue working on established LTGs downgraded to mod assist overall.  Skilled Therapeutic Interventions/Progress Updates:  Patient met lying supine in bed in agreement with OT treatment session. 0/10 pain reported at rest and with activity. Significant other present at bedside. Supine to EOB with assist for rolling to R advancing BLE and for elevating trunk. Sit to stand in stedy in prep for transfer to walk-in shower. Patient able to bathe RUE, chest and abdomen with increased time and LUE. Fair control of [place] during task. Patient also able to wash front perineal area and bilateral upper legs seated on shower chair. Attempted lateral leans for washing buttocks in sitting but patient unable. Assist to wash lower legs and buttocks. Patient then donned UB clothing with assist to don overhead shirt over head and to pull shirt down over abdomen. Patient able to thread both arms with increased time/effort. Patient able to lean outside of BOS to initiate threading of BLE in pants but ultimately required Max A to complete task. Min A for sit to stand EOB and Max A to hike pants over hips in standing. Oral hygiene completed in sitting with assist to put toothpaste  on toothbrush. Session concluded with patient seated in wc with call bell within reach, belt alarm activated and all needs met.   Therapy Documentation Precautions:  Precautions Precautions: Fall Precaution Comments: R hemi, diplopia/visual deficits Restrictions Weight Bearing Restrictions: No General:    Therapy/Group: Individual Therapy  Zanaria Morell R [place] 09/01/2021, 6:43 AM

## 2021-09-01 NOTE — Progress Notes (Signed)
Physical Therapy Session Note  Patient Details  Name: Ronda Kazmi MRN: [id number] Date of Birth: Jun 14, 1996  Today's Date: 09/01/2021 PT Individual Time: 1050-1200 PT Individual Time Calculation (min): 70 min   Short Term Goals: Week 4:  PT Short Term Goal 1 (Week 4): STGs=LTGs  Skilled Therapeutic Interventions/Progress Updates: Pt presented in w/c agreeable to therapy. Pt denies pain during session. First part of session pt focused on w/c mobility  using BUE for forced use of LUE. Pt was able to propel ~72f with min verbal cues for sequencing and significantly increased time. Pt transported remaining distance to rehab gym and transferred to parallel bars. Pt participated in Sit to stand x 5 as well as forward/backwards stepping for LLE coordination with block placed between legs for increased BOS. Pt then participated in gait training with bilateral HHA 327fx 2 with modA. Pt required facilitation for weight shifting, erect posture, and foot clearance. Initially with more forward flexed posture pt noted to have difficulty initiating hip flexion for step through however once posture improved and PTA cueing with word "step" pt noted to have improved sequencing. Pt noted to have improved sequencing during second bout vs first but will with considerable ataxic movement. Pt transported back to room at end of session and left in [place] with belt alarm on, call bell within reach and needs met.      Therapy Documentation Precautions:  Precautions Precautions: Fall Precaution Comments: R hemi, diplopia/visual deficits Restrictions Weight Bearing Restrictions: No General:   Vital Signs: Therapy Vitals Temp: 98.6 F (37 C) Temp Source: Oral Pulse Rate: (!) 104 Resp: 18 BP: 129/89 Patient Position (if appropriate): Sitting Oxygen Therapy SpO2: 99 % O2 Device: Room Air Pain:   Mobility:   Locomotion :    Trunk/Postural Assessment :    Balance:   Exercises:   Other  Treatments:      Therapy/Group: Individual Therapy  Hetal Proano 09/01/2021, 4:15 PM

## 2021-09-02 LAB — CREATININE, SERUM
Creatinine, Ser: 0.92 mg/dL (ref 0.61–1.24)
GFR, Estimated: >60 mL/min (ref >60)

## 2021-09-02 MED ORDER — QUETIAPINE FUMARATE 25 MG PO TABS: 25.0000 mg | ORAL_TABLET | Freq: Every evening | ORAL | Status: DC | PRN

## 2021-09-02 MED ORDER — SENNOSIDES-DOCUSATE SODIUM 8.6-50 MG PO TABS
2.0000 | ORAL_TABLET | Freq: Every day | ORAL | Status: DC
  Administered 2021-09-03 – 2021-09-09 (×7): 2 via ORAL
  Filled 2021-09-02 (×7): qty 2

## 2021-09-02 NOTE — Progress Notes (Signed)
Occupational Therapy Session Note  Patient Details  Name: Roy [name] MRN: [id number] Date of Birth: [birth year]  Today's Date: 09/02/2021 Session 1 OT Individual Time: 8144-8185 OT Individual Time Calculation (min): 71 min   Session 2 OT Individual Time: 1250-1350 OT Individual Time Calculation (min): 60 min    Short Term Goals: Week 4:  OT Short Term Goal 1 (Week 4): Continue working on established LTGs downgraded to mod assist overall.  Skilled Therapeutic Interventions/Progress Updates:  Session 1   Pt greeted semi-reclined in bed and agreeable to OT treatment session. Pt requesting to shower today. Pt completed bed mobility with increased time and min A. OT demonstrated squat-pivot transfer to drop arm wc with pt able to complete with CGA today to the L. Only slight facilitation at hips for pivot, good head/hips relationship. Shower transfer with heavy use of grab bars and min A to get to standing, then min/mod A, increased time, and verbal/tactile cues to motor plan transitional pivot. Bathing completed using B UE's to wash body with min A. Min A for sit><stand in shower using grab bars while OT assisted with washing buttocks. Worked on dressing techniques and sit<>stands at the sink with pt's girlfriend. Focus on positioning of feet over hips and anterior weight shift. Sit<>stands and reaching overhead for strengthening and balance. Pt left seated in wc at end of session with alarm belt on, call bell in reach, and needs met.  Pain:  Denies pain  Session 2 Pt greeted sitting in wc with gf finishing lunch. Pt able to self-feed with larger finger foods and able to grasp cup and bring to mouth. Pt still with difficulty w/ utensils due to grasp, motor planning, and ataxia. OT issued weighted handle spoon and practiced wrist motion for scooping and motor planning bite to mouth. Practiced squat-pivot transfers bed<>drop arm wc using bobath method. OT had pt's girlfriend also practice  transfers with min  A to the L but mod A to the R. Pt brought to therapy gym and trialed slideboard transfer, but pt unable to safely motor plan scoot and it was safer to to just do a squat-pivot transfer. Worked on sit<>stands with weighted RW and mirror feedback for upright  posture. Pt able to get into standing with CGA/min A if his feet and hands were set-up correctly. Addressed weight shifting and stepping forward and back for pre-gait and balance. Pt returned to room and left seated in wc with alarm belt on, call bell in reach, and needs met.   Therapy Documentation Precautions:  Precautions Precautions: Fall Precaution Comments: R hemi, diplopia/visual deficits Restrictions Weight Bearing Restrictions: No Pain:  Denies pain   Therapy/Group: Individual Therapy  Valma Cava 09/02/2021, 1:51 PM

## 2021-09-02 NOTE — Progress Notes (Signed)
PROGRESS NOTE   Subjective/Complaints:  Pt doing well. Admiring himself in the mirror when I came in room. No new complaints  ROS: Patient denies fever, rash, sore throat, blurred vision, nausea, vomiting, diarrhea, cough, shortness of breath or chest pain, joint or back pain, headache, or mood change.    Objective:   No results found. No results for input(s): WBC, HGB, HCT, PLT in the last 72 hours.    Recent Labs    09/02/21 0502  CREATININE 0.92      Intake/Output Summary (Last 24 hours) at 09/02/2021 1029 Last data filed at 09/01/2021 1356 Gross per 24 hour  Intake 480 ml  Output 400 ml  Net 80 ml        Physical Exam: Vital Signs Blood pressure 99/63, pulse 75, temperature 97.8 F (36.6 C), temperature source Oral, resp. rate 18, height 5\' 2"  (1.575 m), weight 98 kg, SpO2 100 %.  Constitutional: No distress . Vital signs reviewed. HEENT: NCAT, EOMI, oral membranes moist Neck: supple Cardiovascular: RRR without murmur. No JVD    Respiratory/Chest: CTA Bilaterally without wheezes or rales. Normal effort    GI/Abdomen: BS +, non-tender, non-distended Ext: no clubbing, cyanosis, or edema Psych: pleasant and cooperative  Skin: Warm and dry.  Intact.. Musc: No edema in extremities.  No tenderness in extremities. Neuro: Alert Speech dysarthric  Motor: RUE 5/5, RLE 4-5/5 LUE and LLE 3+ to 4/5- persistent, improving limb ataxia with FTN/HTS--stable to improved today  Assessment/Plan: 1. Functional deficits which require 3+ hours per day of interdisciplinary therapy in a comprehensive inpatient rehab setting. Physiatrist is providing close team supervision and 24 hour management of active medical problems listed below. Physiatrist and rehab team continue to assess barriers to discharge/monitor patient progress toward functional and medical goals  Care Tool:  Bathing    Body parts bathed by patient:  Right arm, Chest, Abdomen, Front perineal area, Right upper leg, Left upper leg, Face   Body parts bathed by helper: Left arm, Buttocks, Right lower leg, Left lower leg     Bathing assist Assist Level: Moderate Assistance - Patient 50 - 74%     Upper Body Dressing/Undressing Upper body dressing   What is the patient wearing?: Pull over shirt    Upper body assist Assist Level: Minimal Assistance - Patient > 75%    Lower Body Dressing/Undressing Lower body dressing      What is the patient wearing?: Incontinence brief, Pants     Lower body assist Assist for lower body dressing: Maximal Assistance - Patient 25 - 49%     Toileting Toileting    Toileting assist Assist for toileting: Total Assistance - Patient < 25%     Transfers Chair/bed transfer  Transfers assist  Chair/bed transfer activity did not occur: Safety/medical concerns  Chair/bed transfer assist level: Maximal Assistance - Patient 25 - 49% (stand pivot)     Locomotion Ambulation   Ambulation assist   Ambulation activity did not occur: Safety/medical concerns  Assist level: 2 helpers (min/mod A of 1 & +2 w/c follow) Assistive device: Parallel bars Max distance: 85ft   Walk 10 feet activity   Assist     Assist  level: 2 helpers Assistive device: Hand held assist   Walk 50 feet activity   Assist Walk 50 feet with 2 turns activity did not occur: Safety/medical concerns         Walk 150 feet activity   Assist Walk 150 feet activity did not occur: Safety/medical concerns         Walk 10 feet on uneven surface  activity   Assist Walk 10 feet on uneven surfaces activity did not occur: Safety/medical concerns         Wheelchair     Assist Is the patient using a wheelchair?: No             Wheelchair 50 feet with 2 turns activity    Assist            Wheelchair 150 feet activity     Assist          Blood pressure 99/63, pulse 75, temperature 97.8 F  (36.6 C), temperature source Oral, resp. rate 18, height 5\' 2"  (1.575 m), weight 98 kg, SpO2 100 %.  Medical Problem List and Plan: 1. 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  Impaired mobility: -DVT/anticoagulation:  Pharmaceutical: Continue Lovenox. Monitoring creatinine on Thursdays             -antiplatelet therapy: Aspirin 81 mg daily and Brilinta 90 mg twice daily 3. Pain Management:   Tylenol as needed for vascular headache  12/15- no HA's pain- con't tylenol prn 4. Depression/insomnia/agitation: Continue Zoloft 100 mg daily, melatonin 3 mg nightly             -antipsychotic agents: Seroquel 50 mg nightly for agitation  -mood remains positive  -will wean seroquel beginning Monday 12/12  12/15 make hs seroquel prn 5. Neuropsych: This patient is capable of making decisions on his own behalf. 6. Skin/Wound Care: Routine skin checks 7. Fluids/Electrolytes/Nutrition: encourage PO  -hyponatremia 137 12/12     8.  Hyperlipidemia.  Continue Lipitor 9.  History of alcohol and marijuana use.  Urine drug screen positive marijuana.  Counseling 10. Morbid Obesity- BMI 39.52- counseling and diet education 11.  Transaminitis  12/1 hep panel negative, HIV neg   LFT's stable, still sl elevated---f/u as outpt 12. Mild leukocytosis: Resolved  Afebrile  -no s/s infection 14.  Slow transit constipation  -moving bowels regularly, liquidy of late   LOS: 28 days A FACE TO FACE EVALUATION WAS PERFORMED  14/1 09/02/2021, 10:29 AM

## 2021-09-02 NOTE — Progress Notes (Signed)
Physical Therapy Session Note  Patient Details  Name: Roy [name] MRN: [id number] Date of Birth: 04-15-1996  Today's Date: 09/02/2021 PT Individual Time: 9485-4627 PT Individual Time Calculation (min): 57 min   Short Term Goals: Week 4:  PT Short Term Goal 1 (Week 4): STGs=LTGs  Skilled Therapeutic Interventions/Progress Updates:     Pt received seated in WC and agrees to therapy. Girlfriend present for family education. Pt reports some soreness in L thigh. Number not provided. PT provides rest breaks and mobility to manage pain. WC transport to gym for time management. Pt performs sit to stand with minA +1 and cues for body mechanics and sequencing. In standing, pt attempts marching in place and requires modA to maintain balance with poor coordination and inadequate lateral weight shifting. PT provides manual cueing to promote weight shifting, trunk extension to improve posture, and hip extension.  Pt performs training on 6" steps with bilateral hand rails. Steps provide pt with visual cue for foot placement. Pt performs sit to stand with minA and using both arms on handrails to pull himself up. Pt steps up and back on 1st step x3 reps with minA/modA and cues for posture, hand placement, weight shifting and contralateral glute contraction. Following seated rest break, pt performs additional bouts of step training 3x4. PT places cone in middle of step to provide pt with visual cue to promote wider step width, as pt tends to have very narrow BOS.   Pt performs gait training in parallel bars, ambulating forward x5' with minA and multimodal cues for weight shifting and contralateral foot progression. Pt then performs x5' sidestepping to the L and requires modA and increased cueing for sequencing. Following seated rest break, pt perform x10' forward and x10' backward, with PT facilitating lateral and anterior weight shifting, as well as cues at hips and trunk for upright posture.  Pt left seated  in WC with all needs within reach.  Therapy Documentation Precautions:  Precautions Precautions: Fall Precaution Comments: R hemi, diplopia/visual deficits Restrictions Weight Bearing Restrictions: No    Therapy/Group: Individual Therapy  Beau Fanny, PT, DPT 09/02/2021, 11:19 AM

## 2021-09-02 NOTE — Progress Notes (Signed)
Occupational Therapy Weekly Progress Note  Patient Details  Name: Roy [name] MRN: [id number] Date of Birth: 01-Oct-1995  Beginning of progress report period: August 06, 2020 End of progress report period: September 03, 2021  Today's Date: 09/03/2021 OT Individual Time: 7445-1460 OT Individual Time Calculation (min): 60 min    Patient is making steady progress towards OT goals. Patient has demonstrated much improved body awareness, use of B UE's, sit<>stands, and transfers. We have been focusing heavily on family education as well. Patient is able to use  B UE's in much more functional capacity with improved overall strength and coordination to assist with BADL tasks. Sit<>stands can be as little as min A. Transfers are improving, but still requires moderate assistance overall. Plan for patient to dc home next week.   Patient continues to demonstrate the following deficits: muscle weakness, decreased cardiorespiratoy endurance, impaired timing and sequencing, abnormal tone, unbalanced muscle activation, motor apraxia, ataxia, decreased coordination, and decreased motor planning, diploplia, decreased motor planning, decreased initiation, decreased attention, decreased awareness, decreased problem solving, decreased safety awareness, decreased memory, and delayed processing, and decreased sitting balance, decreased standing balance, decreased postural control, hemiplegia, and decreased balance strategies and therefore will continue to benefit from skilled OT intervention to enhance overall performance with BADL and Reduce care partner burden.  Patient progressing toward long term goals..  Continue plan of care.  OT Short Term Goals Week 5:  OT Short Term Goal 1 (Week 5): Continue working on established LTGs  Skilled Therapeutic Interventions/Progress Updates:    Pt greeted seated in wc with girlfriend present. Focus on functional transfer training and BADL tasks. OT reviewed transfer  technqiue and discussed barriers to home. [name] brother already ordere 3-in-1 BSC, tub bench, and rollator walker. OT educated that rollator is not appropriate for pt use at this time. OT then had girlfriend assist with stand-pivot into shower with heavy use of grab bars and min/mod A. OT also discussed pt will likely need 2 people for toileting after BM to ensure safety and cleanliness. Bathing completed with pt able to wash all body parts excpet for bottom. Much improved use of B UE's. Sit<>stand with grab bars and min A while OT washed buttocks. Set-up A to use sponge to wash lower legs. Worked on hand strength with wringing out sponges and was cloths. OT educated on squat-pivot out of shower and drop arm function of wc. Increased time to motor plan but min A for transfer. Forced use of B UE's with all BADL's. Pt able to lean down and assist with threading pant legs and pulling up to knees using B UE's. Min A sit<>stand and attempted to pull pants over hips, but still needed min A to get over buttocks. Grooming tasks from sitting in wc with pt able to apply toothpaste today with set-up A to open. Pt left seated in wc at end of session with alarm belt on, call bel in reach, and needs met.    Therapy Documentation Precautions:  Precautions Precautions: Fall Precaution Comments: R hemi, diplopia/visual deficits Restrictions Weight Bearing Restrictions: No Pain: Pain Assessment Pain Scale: 0-10 Pain Score: 0-No pain   Therapy/Group: Individual Therapy  Roy [name] 09/02/2021, 10:46 PM

## 2021-09-03 DIAGNOSIS — R4189 Other symptoms and signs involving cognitive functions and awareness: Secondary | ICD-10-CM

## 2021-09-03 NOTE — Progress Notes (Signed)
Speech Language Pathology Weekly Progress and Session Note  Patient Details  Name: Foday Cone MRN: [id number] Date of Birth: 1996/07/11  Beginning of progress report period: August 27, 2021 End of progress report period: September 03, 2021  Today's Date: 09/03/2021 SLP Individual Time: 1355-1450 SLP Individual Time Calculation (min): 55 min  Short Term Goals: Week 4: SLP Short Term Goal 1 (Week 4): STGs=LTGs due to ELOS SLP Short Term Goal 1 - Progress (Week 4): Progressing toward goal    New Short Term Goals: Week 5: SLP Short Term Goal 1 (Week 5): STGS=LTGs due to ELOS  Weekly Progress Updates: Patient continues to make excellent gains, therefore, his length of stay was extended to maximize overall function and safety for discharge home. Currently, patient's overall cognitive function appears [facility] for tasks assessed and both the patient and his family report he is at his cognitive baseline. Patient demonstrates improved speech intelligibility and is ~80% intelligible at the sentence level with Min verbal cues needed for use of intelligibility strategies as well as self-monitoring and correcting errors. Patient and family education ongoing. Patient would benefit from continued skilled SLP intervention to maximize his functional communication prior to discharge.      Intensity: Minumum of 1-2 x/day, 30 to 90 minutes Frequency: 3 to 5 out of 7 days Duration/Length of Stay: 09/10/21 Treatment/Interventions: Internal/external aids;Speech/Language facilitation;Cueing hierarchy;Environmental controls;Therapeutic Activities;Functional tasks;Patient/family education   Daily Session  Skilled Therapeutic Interventions:  Skilled treatment session focused on communication goals. SLP facilitated session by providing overall Min A verbal cues for patient to utilize speech intelligibility strategies and to self-monitor and correct phonemic errors during a novel language task. SLP encouraged  patient to attempt treatment session in a more noisy environment, however, patient declined today and reported he did not want to be around a lot of people. Overall, patient was ~80% intelligible at the sentence level. [name]. Continue with current plan of care.      Pain No/Denies Pain   Therapy/Group: Individual Therapy  Joon Pohle 09/03/2021, 6:45 AM

## 2021-09-03 NOTE — Consult Note (Signed)
Neuropsychological Consultation   Patient:   Roy [name]   DOB:   11-Mar-1996  MR Number:  [id number]  Location:  [facility] [address] CENTER A [id number] [place] [address] Dept: [phone] Loc: [phone]           Date of Service:   09/03/2021  Start Time:   10 AM End Time:   11 AM  Provider/Observer:  Arley Phenix, Psy.D.       Clinical Neuropsychologist       Billing Code/Service: [phone]  Chief Complaint:    Roy [name] is a 25 year old male who has a past medical history that is fairly unremarkable with no prescription medications.  Patient presented on 07/26/2021 unresponsive requiring intubation for airway protection and noted for apparent seizure activity.  Patient's girlfriend reported that the patient been having complaints of headaches for the prior 3 or 4 days.  CT angiogram showed intimal irregularity with filling deficits involving the proximal left vertebral artery, V1 segment concerning for acute arterial dissection.  Partially occlusive thrombosis with severe stenosis along the left P1 segment as well as additional distal right P2 embolic occlusions noted.  Patient was initially loaded with Keppra but EEG showed no seizure activity and Keppra was discontinued.  Patient underwent distal embolization to top of basilar status post IR with left LV stenting per IR.  Patient has continued to have decreased functional mobility and was referred to the comprehensive rehabilitation program.  Reason for Service:  Patient was referred for neuropsychological consultation due to coping and dealing with adjustment issues and depression with significant loss in motor and sensory functions.  Below is the HPI for the current admission.  HPI: Roy [name] is a 25 year old right-handed male with unremarkable past medical history on no prescription medications.  He does have a history of marijuana and  alcohol use.  Per chart review he was living at his sister's house working at [employer].  He does have a girlfriend.  Presented 07/26/2021 unresponsive after having intercourse with his girlfriend requiring intubation for airway protection as well as noted seizure.  His fiance/girlfriend reports that patient had been having complaints of headache for 3 to 4 days.  Cranial CT scan negative.  CT angiogram of head and neck showed intimal irregularity with filling defect involving the proximal left vertebral artery, V1 segment concerning for acute arterial dissection.  Subtle irregular nonocclusive filling defect involving the distal basilar artery/basilar tip consistent with associated distal embolic disease.  Adjacent superior cerebellar arteries were not visualized.  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  Negative CT venogram no evidence for dural sinus thrombosis.  Cranial CT scan was again repeated showing no acute pathology.  He was initially loaded with Keppra.  EEG showed no seizure activity and Keppra has since been discontinued.  Admission chemistries potassium 2.7-3.0, glucose 112, WBC 19,000, lactic acid 4.4, alcohol level less than 10, ammonia level 58, urine drug screen positive for marijuana.  Patient did undergo distal embolization to top of the basilar status post IR with left LV stenting per interventional radiology.  Currently maintained on aspirin 81 mg daily as well as Brilinta 90 mg twice daily.  Subcutaneous Lovenox for DVT prophylaxis.  Initially with Cortrak feeding tube diet is since been advanced to regular.  Bouts of restlessness agitation monitor closely on Seroquel nightly.  Therapy evaluations completed due to patient  decreased functional mobility was admitted for a comprehensive rehab program.  Current Status:  The patient was sitting in his wheelchair with his girlfriend/fianc in the  room when I entered the room.  The patient was positive and engaging during our interactions with the girlfriend frequently interjecting her [name] into the discussion to add context and depth to information being gathered and due to describe her thoughts on various issues.  The patient was aware that he had had a vascular event holding the back of his head to describe where.  Expressive language had deficits with regard to word finding, fluency and articulation.  Patient described significant motor deficits, strength and coordination issues and significant fine motor control deficits.  Patient also described somatosensory deficits as well.  We reviewed issues and information regarding what had happened and transpired in the interventions that were done through IR including left LV stenting.  Patient had questions about risk of possibility for repeat events.  Patient described his understanding of what has transpired and motivation to continue to work on therapeutic interventions.  We reviewed issues regarding his frustration and coping with acute loss of functioning and concerns about future limitations.  Behavioral Observation: Tagen Milby  presents as a 25 y.o.-year-old Right handed African American Male who appeared his stated age. his dress was Appropriate and he was Well Groomed and his manners were Appropriate to the situation.  his participation was indicative of Appropriate, Inattentive, and Redirectable behaviors.  There were physical disabilities noted.  he displayed an appropriate level of cooperation and motivation.     Interactions:    Active Appropriate, Inattentive, and Redirectable  Attention:   abnormal and attention span appeared shorter than expected for age  Memory:   abnormal; remote memory intact, recent memory impaired  Visuo-spatial:  not examined  Speech (Volume):  normal  Speech:   Patient was fluent but with clear word finding difficulties and articulation  deficits  Thought Process:  Tangential  Though Content:  WNL; not suicidal and not homicidal  Orientation:   person, place, time/date, and situation  Judgment:   Fair  Planning:   Poor  Affect:    Excited  Mood:    Euphoric  Insight:   Fair  Intelligence:   normal  Substance Use:  Patient did have positive urine drug screen for marijuana and admitted to marijuana use but no other substance abuse.  Medical History:  History reviewed. No pertinent past medical history.       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 No pertinent family history.  Impression/DX:  Dayn Barich is a 25 year old male who has a past medical history that is fairly unremarkable with no prescription medications.  Patient presented on 07/26/2021 unresponsive requiring intubation for airway protection and noted for apparent seizure activity.  Patient's girlfriend reported that the patient been having complaints of headaches for the prior 3 or 4 days.  CT angiogram showed intimal irregularity with filling deficits involving the proximal left vertebral  artery, V1 segment concerning for acute arterial dissection.  Partially occlusive thrombosis with severe stenosis along the left P1 segment as well as additional distal right P2 embolic occlusions noted.  Patient was initially loaded with Keppra but EEG showed no seizure activity and Keppra was discontinued.  Patient underwent distal embolization to top of basilar  status post IR with left LV stenting per IR.  Patient has continued to have decreased functional mobility and was referred to the comprehensive rehabilitation program.  The patient was sitting in his wheelchair with his girlfriend/fianc in the room when I entered the room.  The patient was positive and engaging during our interactions with the girlfriend frequently interjecting her [name] into the discussion to add context and depth to information being gathered and due to describe her thoughts on various issues.  The patient was aware that he had had a vascular event holding the back of his head to describe where.  Expressive language had deficits with regard to word finding, fluency and articulation.  Patient described significant motor deficits, strength and coordination issues and significant fine motor control deficits.  Patient also described somatosensory deficits as well.  We reviewed issues and information regarding what had happened and transpired in the interventions that were done through IR including left LV stenting.  Patient had questions about risk of possibility for repeat events.  Patient described his understanding of what has transpired and motivation to continue to work on therapeutic interventions.  We reviewed issues regarding his frustration and coping with acute loss of functioning and concerns about future limitations.  Disposition/Plan:  Today we worked on coping and adjustment issues following significant vascular event and vertebral artery with resultant embolic impacts downstream.  Patient with residual motor deficits, cognitive deficits, expressive language deficits and changes in somatosensory functioning.  Diagnosis:    Posterior circulation stroke [facility]) - Plan: Ambulatory referral to Neurology, Ambulatory referral to Physical Medicine Rehab         Electronically Signed   _______________________ Arley Phenix, Psy.D. Clinical Neuropsychologist

## 2021-09-03 NOTE — Progress Notes (Signed)
Occupational Therapy Session Note  Patient Details  Name: Roy [name] MRN: [id number] Date of Birth: 21-Jan-1996  Today's Date: 09/03/2021 OT Individual Time: 2330-0762 OT Individual Time Calculation (min): 34 min    Short Term Goals: Week 5:  OT Short Term Goal 1 (Week 5): Continue working on established LTGs  Skilled Therapeutic Interventions/Progress Updates:  Pt greeted seated in w/c agreeable to OT intervention. Session focus on BUE [name].  Pt transported to gym with total A. Pt completed seated FMC task where pt instructed to create leggo structures from visual aid provided with emphasis on incorporating RUE into tasks to facilitate improved BUE [name] for ADL participation. Pt completed x3 level 1 structures with MIN verbal cues to recall incorporating RUE into task. Pt needed intermittent MIN A to assist with stabilizing structure d/t compliant leggos. Pt able to assist OTA with wiping off leggos with wipes with Total A needed to don gloves but pt able to use BUEs to wipe off leggos and place into box. Pt transported back to room with total A where pt left up in w/c with alarm belt activated and all needs within reach.                     Therapy Documentation Precautions:  Precautions Precautions: Fall Precaution Comments: R hemi, diplopia/visual deficits Restrictions Weight Bearing Restrictions: No  Pain: no pain reported during session     Therapy/Group: Individual Therapy  Barron Schmid 09/03/2021, 3:34 PM

## 2021-09-03 NOTE — Progress Notes (Signed)
Physical Therapy Session Note  Patient Details  Name: Roy [name] MRN: [id number] Date of Birth: [birth year]  Today's Date: 09/03/2021 PT Individual Time: 0805-0900 PT Individual Time Calculation (min): 55 min   Short Term Goals: Week 4:  PT Short Term Goal 1 (Week 4): STGs=LTGs   Skilled Therapeutic Interventions/Progress Updates:   Pt received supine in bed and agreeable to PT. Donning pants in supine through bridge with supervision assist for LE position and total A* clothing management. Supine>sit transfer with min assist and cues for proper use of BUE on bed rail. Stand pivot transfer to [facility] with min assist from PT and moderate ues for LE and UE placement.   WC mobility with BUE propulsion and tband on wheel rims x 9f with min assist fading to mod assist with fatigue and max cues for improved ROM on the LUE.   [facility] mod assist x 12 throughout session. Cues for proper LE placement and anterior weight shift. Pregait reciprocal stepping 2 x 4 BLE with cues for posture with emphasis on step width.   Gait training with RW and mod-max assist from PT, 3 x 459fwith yard stick between feet for visual cue for adequate step width. Dynamic balance while engaged in horseshoe toss to target x 4 BUE with min assist for balance and mod-max cues for sequencing of grasp movement to release horseshoe.   [facility] returned to room and [facility] and all needs met.         Therapy Documentation Precautions:  Precautions Precautions: Fall Precaution Comments: R hemi, diplopia/visual deficits Restrictions Weight Bearing Restrictions: No  Pain: denies    Therapy/Group: Individual Therapy  AuLorie Phenix2/16/2022, 9:06 AM

## 2021-09-03 NOTE — Progress Notes (Signed)
PROGRESS NOTE   Subjective/Complaints:  No problems last night. No pain. Happy that he's making progress!  ROS: Patient denies fever, rash, sore throat, blurred vision, nausea, vomiting, diarrhea, cough, shortness of breath or chest pain, joint or back pain, headache, or mood change.    Objective:   No results found. No results for input(s): WBC, HGB, HCT, PLT in the last 72 hours.    Recent Labs    09/02/21 0502  CREATININE 0.92      Intake/Output Summary (Last 24 hours) at 09/03/2021 1125 Last data filed at 09/03/2021 0810 Gross per 24 hour  Intake --  Output 900 ml  Net -900 ml        Physical Exam: Vital Signs Blood pressure 136/89, pulse 91, temperature 98.4 F (36.9 C), temperature source Oral, resp. rate 18, height 5\' 2"  (1.575 m), weight 98 kg, SpO2 95 %.  Constitutional: No distress . Vital signs reviewed. HEENT: NCAT, EOMI, oral membranes moist Neck: supple Cardiovascular: RRR without murmur. No JVD    Respiratory/Chest: CTA Bilaterally without wheezes or rales. Normal effort    GI/Abdomen: BS +, non-tender, non-distended Ext: no clubbing, cyanosis, or edema Psych: pleasant and cooperative Skin: Warm and dry.  Intact.. Musc: No edema in extremities.  No tenderness in extremities. Neuro: Alert Speech still dysarthric but much clearer Motor: RUE 5/5, RLE 4-5/5 LUE and LLE 3+ to 4/5- persistent,   limb ataxia with FTN/HTS- struggles more on left but improving  Assessment/Plan: 1. Functional deficits which require 3+ hours per day of interdisciplinary therapy in a comprehensive inpatient rehab setting. Physiatrist is providing close team supervision and 24 hour management of active medical problems listed below. Physiatrist and rehab team continue to assess barriers to discharge/monitor patient progress toward functional and medical goals  Care Tool:  Bathing    Body parts bathed by patient:  Right arm, Chest, Abdomen, Front perineal area, Right upper leg, Left upper leg, Face   Body parts bathed by helper: Left arm, Buttocks, Right lower leg, Left lower leg     Bathing assist Assist Level: Moderate Assistance - Patient 50 - 74%     Upper Body Dressing/Undressing Upper body dressing   What is the patient wearing?: Pull over shirt    Upper body assist Assist Level: Minimal Assistance - Patient > 75%    Lower Body Dressing/Undressing Lower body dressing      What is the patient wearing?: Incontinence brief, Pants     Lower body assist Assist for lower body dressing: Maximal Assistance - Patient 25 - 49%     Toileting Toileting    Toileting assist Assist for toileting: Total Assistance - Patient < 25%     Transfers Chair/bed transfer  Transfers assist  Chair/bed transfer activity did not occur: Safety/medical concerns  Chair/bed transfer assist level: Maximal Assistance - Patient 25 - 49% (stand pivot)     Locomotion Ambulation   Ambulation assist   Ambulation activity did not occur: Safety/medical concerns  Assist level: 2 helpers (min/mod A of 1 & +2 w/c follow) Assistive device: Parallel bars Max distance: 48ft   Walk 10 feet activity   Assist     Assist  level: 2 helpers Assistive device: Hand held assist   Walk 50 feet activity   Assist Walk 50 feet with 2 turns activity did not occur: Safety/medical concerns         Walk 150 feet activity   Assist Walk 150 feet activity did not occur: Safety/medical concerns         Walk 10 feet on uneven surface  activity   Assist Walk 10 feet on uneven surfaces activity did not occur: Safety/medical concerns         Wheelchair     Assist Is the patient using a wheelchair?: No             Wheelchair 50 feet with 2 turns activity    Assist            Wheelchair 150 feet activity     Assist          Blood pressure 136/89, pulse 91, temperature 98.4  F (36.9 C), temperature source Oral, resp. rate 18, height 5\' 2"  (1.575 m), weight 98 kg, SpO2 95 %.  Medical Problem List and Plan: 1. 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  Impaired mobility: -DVT/anticoagulation:  Pharmaceutical: Continue Lovenox. Monitoring creatinine on Thursdays             -antiplatelet therapy: Aspirin 81 mg daily and Brilinta 90 mg twice daily 3. Pain Management:   Tylenol as needed for vascular headache  12/16 much improved 4. Depression/insomnia/agitation: Continue Zoloft 100 mg daily, melatonin 3 mg nightly             -antipsychotic agents: Seroquel    -mood remains positive  -will wean seroquel beginning Monday 12/12  12/16 seroquel now 25mg  qhs prn 5. Neuropsych: This patient is capable of making decisions on his own behalf. 6. Skin/Wound Care: Routine skin checks 7. Fluids/Electrolytes/Nutrition: encourage PO  -hyponatremia 137 12/12     8.  Hyperlipidemia.  Continue Lipitor 9.  History of alcohol and marijuana use.  Urine drug screen positive marijuana.  Counseling 10. Morbid Obesity- BMI 39.52- counseling and diet education 11.  Transaminitis  12/1 hep panel negative, HIV neg   LFT's stable, still sl elevated---f/u as outpt 12. Mild leukocytosis: Resolved  Afebrile  -no s/s infection 14.  Slow transit constipation  -backed off laxatives, stool more formed today 12/16   LOS: 29 days A FACE TO FACE EVALUATION WAS PERFORMED  14/1 09/03/2021, 11:25 AM

## 2021-09-04 NOTE — Progress Notes (Signed)
Physical Therapy Session Note  Patient Details  Name: Roy [name] MRN: [id number] Date of Birth: [birth year]  Today's Date: 09/04/2021 PT Individual Time: 0930-1025 PT Individual Time Calculation (min): 55 min   Short Term Goals: Week 4:  PT Short Term Goal 1 (Week 4): STGs=LTGs  Skilled Therapeutic Interventions/Progress Updates:     Patient in bed with rehab tech assisting with patient putting on pants at bed level upon PT arrival. Patient alert and agreeable to PT session. Patient denied pain during session.  Therapeutic Activity: Bed Mobility: Patient performed supine to sit with CGA-min A with increased time. Provided verbal cues for slow controlled movements and progressing through R side-lying to come to sitting. Transfers: Patient performed stand pivot bed>w/c with min A using w/c are rests for steadying support. He performed sit to/from stand x3 with min-mod A with posterior bias using weighted RW for increased stability due to decreased postural control and truncal ataxia. Provided verbal cues and facilitation for foot placement, forward weight shift, and controlled trunk and hip extension to reduce posterior bias.  Gait Training:  Patient ambulated 20 feet x2 using a weighted RW and a R anterior strut AFO and L PLS AFO for proprioceptive feedback through the foot ankle and shin or calf for improved foot placement and limb stability in stance with mod A and intermittent max A with LOB due to ataxia with +2 for AD control and +3 for w/c follow for safety. Ambulated with increased R knee flexion in stance, decreased R weight shift, decreased L wrist and hand control for grip and steering of the RW, ataxic gait pattern, and R LOB x3 due to cross stepping. Provided verbal cues for erect posture, weight shift followed by stepping to ensure COG over single leg BOS in stance, and visual target of stepping out rather than across on the R. Utilized a mirror for visual feedback. Noted  significant improvement with reciprocal stepping and sequencing with increased repetition.   Patient reported that the AFO's hurt his feet, may suggest carbon fiber AFOs for a less rigid support. May also benefit from platform support bilaterally to improved upper extremity control with RW.  Neuromuscular Re-ed: Patient performed the following lower extremity motor control activities for carryover into gait training: -w/c propulsion ~100 feet with B lower extremities for reciprocal patterning and increased hamstring motor control activation -sit to stand with min A with RW with B 2.5 lb ankle weights and orange exercise band around thighs to promote abductor activation and increased BOS in standing, progressed to weight shifting and marching with posterior bias due to increased effort with ankle weights -performed as above without ankle weights and with patient's hands on therapist's shoulders assist cues with upper extremity control/awareness in standing, with reduced posterior bias and able to complete 5 repetitions of heel lifts and marching while maintaining balance with mod A -Performed as above with R anterior strut AFO and L PLS AFO, used for purposes stated above in gait training, with improved weight shift and foot clearance during marching  Patient in the w/c with his girlfriend in the room at end of session with breaks locked, seat belt alarm set, and all needs within reach.   Therapy Documentation Precautions:  Precautions Precautions: Fall Precaution Comments: R hemi, diplopia/visual deficits Restrictions Weight Bearing Restrictions: No    Therapy/Group: Individual Therapy  Berle Fitz L Kyrstin Campillo PT, DPT  09/04/2021, 8:22 PM

## 2021-09-04 NOTE — Progress Notes (Signed)
Speech Language Pathology Daily Session Note  Patient Details  Name: Roy [name] MRN: [id number] Date of Birth: [birth year]  Today's Date: 09/04/2021 SLP Individual Time: 1100-1130 SLP Individual Time Calculation (min): 30 min  Short Term Goals: Week 4: SLP Short Term Goal 1 (Week 4): STGs=LTGs due to ELOS SLP Short Term Goal 1 - Progress (Week 4): Progressing toward goal  Skilled Therapeutic Interventions:   Patient seen for skilled ST session focused on speech goals. Patient sitting in [facility] when SLP arrived and very eager for therapy session. SLP directed patient through controlled breathing task (in through nose, out through mouth) and continuous phonation at phoneme level. SLP then used metronome app with visual and auditory cue to work on patient's pacing and consistency with speech production with singing ABC song, Happy [place] Ouch and Beulah Gandy the [employer]. Patient was able to improve his pacing but continues to become more rapid, requiring verbal cues. Patient produced voiced and voiceless pairs (/t, d/ /p, b/) and /g, k/). He struggled the most with bilabial /b, p/ but when instructed to use "soft voice" he was able to cease voicing of voiceless consonant. Patient left in [facility] with all needs in place. He continues to benefit from skilled SLP intervention to maximize speech, voice, cognitive function prior to discharge.  Pain Pain Assessment Pain Scale: 0-10 Pain Score: 0-No pain  Therapy/Group: Individual Therapy  Angela Nevin, MA, CCC-SLP Speech Therapy

## 2021-09-04 NOTE — Progress Notes (Signed)
Patients girlfriend demanding that staff put patient back to bed because she wants to take a nap. Therapist, sports entered room and explained to girlfriend that patient states he does not want to go to bed, this was the patients room, if she wanted to take a nap she could go home. Patients girlfriend yelling that I do not have a ride. Offered to get GF a ride home. Girlfriend declined to leave. Patient call light within reach, safety devices in place.

## 2021-09-05 NOTE — Progress Notes (Signed)
Physical Therapy Session Note  Patient Details  Name: Roy [name] MRN: [id number] Date of Birth: 02-25-1996  Today's Date: 09/05/2021 PT Individual Time: 1045-1200 PT Individual Time Calculation (min): 75 min   Short Term Goals: Week 4:  PT Short Term Goal 1 (Week 4): STGs=LTGs  Skilled Therapeutic Interventions/Progress Updates:    Pt received seated in w/c in room, agreeable to PT session. No complaints of pain. Assisted pt with donning shoes while seated in w/c. Manual w/c propulsion x 100 ft with use of BLE for hamstring strengthening. Squat pivot transfer to mat table with mod A with cues for sequencing of transfer. Session focus on sit to stands to weighted RW and pre-gait training in standing with RW. Standing mini-squats x 5 reps with RW and min A for balance, cues for L knee flexion/extension. Standing alt L/R marches with use of mirror and 4# weight around L ankle for increase feedback/input due to ataxia of LLE and LOB to the L when lifting LLE. Pt exhibits improved control and balance with use of mirror and weight on LLE. Sit to stand 3 x 5 reps with no UE support and CGA from mat table to RW with focus on LE activation. Squat pivot transfer back to w/c with mod A to the L. Manual w/c propulsion x 100 ft with use of BLE at Supervision level. Pt left seated in w/c in room with needs in reach, quick release belt and chair alarm in place at end of session.  Therapy Documentation Precautions:  Precautions Precautions: Fall Precaution Comments: R hemi, diplopia/visual deficits Restrictions Weight Bearing Restrictions: No     Therapy/Group: Individual Therapy   Peter Congo, PT, DPT, CSRS  09/05/2021, 12:38 PM

## 2021-09-05 NOTE — Progress Notes (Signed)
PROGRESS NOTE   Subjective/Complaints:  Appreciate neuropsych consult , pt says he has intermittent tremors   ROS: Patient denies pains or breathing issues    Objective:   No results found. No results for input(s): WBC, HGB, HCT, PLT in the last 72 hours.    No results for input(s): NA, K, CL, CO2, GLUCOSE, BUN, CREATININE, CALCIUM in the last 72 hours.     Intake/Output Summary (Last 24 hours) at 09/05/2021 1016 Last data filed at 09/05/2021 0754 Gross per 24 hour  Intake 1080 ml  Output 300 ml  Net 780 ml         Physical Exam: Vital Signs Blood pressure 103/68, pulse 79, temperature 98 F (36.7 C), resp. rate 19, height 5\' 2"  (1.575 m), weight 98 kg, SpO2 98 %.   General: No acute distress Mood and affect are appropriate Heart: Regular rate and rhythm no rubs murmurs or extra sounds Lungs: Clear to auscultation, breathing unlabored, no rales or wheezes Abdomen: Positive bowel sounds, soft nontender to palpation, nondistended Extremities: No clubbing, cyanosis, or edema Skin: No evidence of breakdown, no evidence of rash    Neuro: Alert Speech still dysarthric but much clearer Motor: RUE 5/5, RLE 4-5/5 LUE and LLE 3+ to 4/5- persistent,   limb ataxia with FTN/HTS- struggles more on left but improving  Assessment/Plan: 1. Functional deficits which require 3+ hours per day of interdisciplinary therapy in a comprehensive inpatient rehab setting. Physiatrist is providing close team supervision and 24 hour management of active medical problems listed below. Physiatrist and rehab team continue to assess barriers to discharge/monitor patient progress toward functional and medical goals  Care Tool:  Bathing    Body parts bathed by patient: Right arm, Chest, Abdomen, Front perineal area, Right upper leg, Left upper leg, Face   Body parts bathed by helper: Left arm, Buttocks, Right lower leg, Left lower  leg     Bathing assist Assist Level: Moderate Assistance - Patient 50 - 74%     Upper Body Dressing/Undressing Upper body dressing   What is the patient wearing?: Pull over shirt    Upper body assist Assist Level: Minimal Assistance - Patient > 75%    Lower Body Dressing/Undressing Lower body dressing      What is the patient wearing?: Incontinence brief, Pants     Lower body assist Assist for lower body dressing: Maximal Assistance - Patient 25 - 49%     Toileting Toileting    Toileting assist Assist for toileting: Total Assistance - Patient < 25%     Transfers Chair/bed transfer  Transfers assist  Chair/bed transfer activity did not occur: Safety/medical concerns  Chair/bed transfer assist level: Maximal Assistance - Patient 25 - 49% (stand pivot)     Locomotion Ambulation   Ambulation assist   Ambulation activity did not occur: Safety/medical concerns  Assist level: 2 helpers (min/mod A of 1 & +2 w/c follow) Assistive device: Parallel bars Max distance: 84ft   Walk 10 feet activity   Assist     Assist level: 2 helpers Assistive device: Hand held assist   Walk 50 feet activity   Assist Walk 50 feet with 2 turns activity  did not occur: Safety/medical concerns         Walk 150 feet activity   Assist Walk 150 feet activity did not occur: Safety/medical concerns         Walk 10 feet on uneven surface  activity   Assist Walk 10 feet on uneven surfaces activity did not occur: Safety/medical concerns         Wheelchair     Assist Is the patient using a wheelchair?: No             Wheelchair 50 feet with 2 turns activity    Assist            Wheelchair 150 feet activity     Assist          Blood pressure 103/68, pulse 79, temperature 98 F (36.7 C), resp. rate 19, height 5\' 2"  (1.575 m), weight 98 kg, SpO2 98 %.  Medical Problem List and Plan: 1. 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  Impaired mobility: -DVT/anticoagulation:  Pharmaceutical: Continue Lovenox. Monitoring creatinine on Thursdays             -antiplatelet therapy: Aspirin 81 mg daily and Brilinta 90 mg twice daily 3. Pain Management:   Tylenol as needed for vascular headache  12/16 much improved 4. Depression/insomnia/agitation: Continue Zoloft 100 mg daily, melatonin 3 mg nightly             -antipsychotic agents: Seroquel    -mood remains positive  -will wean seroquel beginning Monday 12/12  12/16 seroquel now 25mg  qhs prn 5. Neuropsych: This patient is capable of making decisions on his own behalf. 6. Skin/Wound Care: Routine skin checks 7. Fluids/Electrolytes/Nutrition: encourage PO  -hyponatremia 137 12/12     8.  Hyperlipidemia.  Continue Lipitor 9.  History of alcohol and marijuana use.  Urine drug screen positive marijuana.  Counseling 10. Morbid Obesity- BMI 39.52- counseling and diet education 11.  Transaminitis  12/1 hep panel negative, HIV neg   LFT's stable, still sl elevated---f/u as outpt 12. Mild leukocytosis: Resolved  Afebrile  -no s/s infection 14.  Slow transit constipation  -backed off laxatives, stool more formed today 12/16  15.  Trmors - has ataxia but pt's girlfriendstates that his tremors look differnent, ? Spasticity vs tremor will defer to primary team  LOS: 31 days A FACE TO FACE EVALUATION WAS PERFORMED  14/1 09/05/2021, 10:16 AM

## 2021-09-06 NOTE — Progress Notes (Signed)
Patient ID: Calob Baskette, male   DOB: Mar 24, 1996, 25 y.o.   MRN: [id number]  SW left message for pt sister [name] ([phone]) to  inquire if there were any updates on his DME needs and remind her his d/c date is 12/23. SW sent text message as well discussing above. No response at this time.  *SW met with pt and pt sister in room to discuss discharge plan. Confirms the following DME: TTB, 3in1 BSC. SW will confirm about the w/c style needed and if he will get a loaner w/c.  SW received updates from PT indicating [facility] will allow pt to have loaner w/c until MCD approved. SW will update pt sister.   Loralee Pacas, MSW, [place] Office: [phone] Cell: [phone] Fax: [phone]

## 2021-09-06 NOTE — Progress Notes (Signed)
Speech Language Pathology Daily Session Note  Patient Details  Name: Linford Quintela MRN: [id number] Date of Birth: 1996/04/01  Today's Date: 09/06/2021 SLP Individual Time: 3545-6256 SLP Individual Time Calculation (min): 40 min  Short Term Goals: Week 5: SLP Short Term Goal 1 (Week 5): STGS=LTGs due to ELOS  Skilled Therapeutic Interventions: Skilled treatment session focused on communication goals. SLP facilitated session by providing overall Min A multimodal cues for use of speech intelligibility strategies and to self-monitor and correct errors while reading aloud at the paragraph level. Patient was overall 80% intelligible throughout task. Patient left upright in bed with alarm on and all needs within reach. Continue with current plan of care.      Pain No/Denies Pain   Therapy/Group: Individual Therapy  Linkoln Alkire 09/06/2021, 12:40 PM

## 2021-09-06 NOTE — Progress Notes (Signed)
Occupational Therapy Session Note  Patient Details  Name: Roy [name] MRN: [id number] Date of Birth: July 28, 1996  Today's Date: 09/06/2021 OT Individual Time: 2703-5009 OT Individual Time Calculation (min): 60 min   Short Term Goals: Week 5:  OT Short Term Goal 1 (Week 5): Continue working on established LTGs  Skilled Therapeutic Interventions/Progress Updates:    Pt greeted semi-reclined in bed with sister and aunt present for family education. Pt completed bed mobility from flat bed with min A to elevate trunk. OT educated pt's sister on bobath method for squat-pivot transfers. OT demonstrated first and educated on body mechanics, then his sister [name] performed transfer. Pt brought to shower and performed mod A stand-pivot using grab bars to stand and pivot. Bathing completed using B UE's for neuro re-ed. Sit<>stand using grab bars with min A while OT washed buttocks. Long handled sponge to wash lower legs with set-up A. OT educated [name] on sit<>stands at the sink with min A, modifications for BADL task in home environment including sponge baths for now as wc will not fit into bathroom. Dressing tasks completed with mod A to thread pants today, min A to stand with verbal cues for leg positioning. Pt left seated in wc at the sink with his sister braiding his hair and needs met.   Therapy Documentation Precautions:  Precautions Precautions: Fall Precaution Comments: R hemi, diplopia/visual deficits Restrictions Weight Bearing Restrictions: No Pain:  Denies pain  Therapy/Group: Individual Therapy  Valma Cava 09/06/2021, 12:45 PM

## 2021-09-06 NOTE — Progress Notes (Signed)
Patient ID: Roy [name], male   DOB: 06-May-1996, 25 y.o.   MRN: [id number]  *Late entry*   Patient/Family Conference  Patient/family in attendance: [name] (sister), Annice Pih (aunt), [name] (girlfriend), [place] (niece)  Haematologist in attendance: Ivory Broad ( attending), Feliberto Gottron (SLP), Kennyth Arnold (Case Manager)  Main focus: discussing pt care needs at time of discharge. Focusing on support pt will have at discharge, and DME needs.  Synopsis of information shared: Attending explained medical reasons surrounding current hospitalization and improvement that will occur over time (I.e. long recovery period). Discussion on pt cognition and behaviors in which pt requires more time to process information, and requires redirection if perseverating on specific topics. Also reminded family pt insight and judgement is limited at this time. Discussed physical support pt will require, and pt will d/c at w/c level due to overall weakness and challenges with coordination. Pt sister [name] will be primary caregiver at time of discharge, and pt girlfriend [name] will continue to provide assistance. Other family to help PRN due to work schedules.   Barriers/concerns expressed by patient and family: DME needs for patient and additional support.  Patient/family response: Family expressed understanding.   Follow-up/action plans: Family will begin to look for DME: DABSC, TTB, and RW. SW will confirm with family w/c recommended for patient. SW will explore charity HH closer towards d/c, pt set up for f/u appt with PCP, and pt will be set up with [facility] medication assistance program. Karlene Einstein on Dec 13 1pm-3:30pm and Dec 15 9am-2pm with pt girlfriend [place] and sister [name].   Cecile Sheerer, MSW, LCSWA Office: [phone] Cell: [phone] Fax: [phone]

## 2021-09-06 NOTE — Progress Notes (Signed)
Physical Therapy Session Note  Patient Details  Name: Roy [name] MRN: [id number] Date of Birth: 1996-09-10  Today's Date: 09/06/2021 PT Individual Time: 1418-1530 PT Individual Time Calculation (min): 72 min   Short Term Goals: Week 3:  PT Short Term Goal 1 (Week 3): Pt will perform bed mobility consistently with minA. PT Short Term Goal 1 - Progress (Week 3): Progressing toward goal PT Short Term Goal 2 (Week 3): Pt will perform sit to stand consistently with minA. PT Short Term Goal 2 - Progress (Week 3): Progressing toward goal PT Short Term Goal 3 (Week 3): Pt will perform bed to chair consistently with minA. PT Short Term Goal 3 - Progress (Week 3): Progressing toward goal PT Short Term Goal 4 (Week 3): Pt will ambulate x25' with modA and LRAD. PT Short Term Goal 4 - Progress (Week 3): Progressing toward goal Week 4:  PT Short Term Goal 1 (Week 4): STGs=LTGs  Skilled Therapeutic Interventions/Progress Updates: Pt presents sitting n w/c and eager to participate in therapy.  Pt wheeled self to day room x 100' w/ B LEs, difficulty w/ turns and objects on right.  Pt performed multiple sit to stand transfers w/ min A throughout session from w/c and mat table.  Pt performed step-pivot transfer w/c > mat table w/ mod A, verbal cues and facilitation for weight shift for LE advancement, 4# weight to LLE.  Pt performed standing reaching and crossing midline to move cones from low to higher table, alternating UEs.  Pt w/ LOB to right w/ reaching far outside of BOS forward.  Pt performed marching at weighted RW w/ mirror w/ LOB posteriorly 2/2 placement of LLE w/ return to floor.  Pt performedpartial squats 2 x 8 w/ final squat w/ hands on knees and return to sitting w/ min A, no plopping.  Pt performed step-pivot to w/c from mat w/mod A, cueing for sequencing.  Pt wheeled self x 60' before fatiguing.  Pt w/ improved reciprocal motion of LEs during final w/c mobility.  Pt remained sitting in w/c w/  chair alarm on, S.O. present in room.       Therapy Documentation Precautions:  Precautions Precautions: Fall Precaution Comments: R hemi, diplopia/visual deficits Restrictions Weight Bearing Restrictions: No General:   Vital Signs: Therapy Vitals Temp: 98.1 F (36.7 C) Temp Source: Oral Pulse Rate: (!) 107 Resp: 19 BP: (!) 124/46 Patient Position (if appropriate): Sitting Oxygen Therapy SpO2: 100 % O2 Device: Room Air Pain:0/10       Therapy/Group: Individual Therapy  Roy [name] 09/06/2021, 3:35 PM

## 2021-09-06 NOTE — Progress Notes (Signed)
PROGRESS NOTE   Subjective/Complaints: Pt in good spirits. Pleased that he's progressing. Had an uneventful weekend  ROS: Patient denies fever, rash, sore throat, blurred vision, nausea, vomiting, diarrhea, cough, shortness of breath or chest pain, joint or back pain, headache, or mood change.   Objective:   No results found. No results for input(s): WBC, HGB, HCT, PLT in the last 72 hours.    No results for input(s): NA, K, CL, CO2, GLUCOSE, BUN, CREATININE, CALCIUM in the last 72 hours.     Intake/Output Summary (Last 24 hours) at 09/06/2021 1133 Last data filed at 09/06/2021 0900 Gross per 24 hour  Intake 720 ml  Output 900 ml  Net -180 ml        Physical Exam: Vital Signs Blood pressure 119/81, pulse 90, temperature 97.9 F (36.6 C), temperature source Oral, resp. rate 18, height 5\' 2"  (1.575 m), weight 98 kg, SpO2 92 %.   Constitutional: No distress . Vital signs reviewed. HEENT: NCAT, EOMI, oral membranes moist Neck: supple Cardiovascular: RRR without murmur. No JVD    Respiratory/Chest: CTA Bilaterally without wheezes or rales. Normal effort    GI/Abdomen: BS +, non-tender, non-distended Ext: no clubbing, cyanosis, or edema Psych: pleasant and cooperative  Neuro: Alert Speech nasal but  much clearer, syncs up breathing better Motor: RUE 5/5, RLE 4-5/5 LUE and LLE 3+ to 4/5- persistent,   limb ataxia with FTN/HTS- struggles more on left but still improving  Assessment/Plan: 1. Functional deficits which require 3+ hours per day of interdisciplinary therapy in a comprehensive inpatient rehab setting. Physiatrist is providing close team supervision and 24 hour management of active medical problems listed below. Physiatrist and rehab team continue to assess barriers to discharge/monitor patient progress toward functional and medical goals  Care Tool:  Bathing    Body parts bathed by patient: Right arm,  Chest, Abdomen, Front perineal area, Right upper leg, Left upper leg, Face   Body parts bathed by helper: Left arm, Buttocks, Right lower leg, Left lower leg     Bathing assist Assist Level: Moderate Assistance - Patient 50 - 74%     Upper Body Dressing/Undressing Upper body dressing   What is the patient wearing?: Pull over shirt    Upper body assist Assist Level: Minimal Assistance - Patient > 75%    Lower Body Dressing/Undressing Lower body dressing      What is the patient wearing?: Incontinence brief, Pants     Lower body assist Assist for lower body dressing: Maximal Assistance - Patient 25 - 49%     Toileting Toileting    Toileting assist Assist for toileting: Total Assistance - Patient < 25%     Transfers Chair/bed transfer  Transfers assist  Chair/bed transfer activity did not occur: Safety/medical concerns  Chair/bed transfer assist level: Maximal Assistance - Patient 25 - 49% (stand pivot)     Locomotion Ambulation   Ambulation assist   Ambulation activity did not occur: Safety/medical concerns  Assist level: 2 helpers (min/mod A of 1 & +2 w/c follow) Assistive device: Parallel bars Max distance: 12ft   Walk 10 feet activity   Assist     Assist level: 2 helpers  Assistive device: Hand held assist   Walk 50 feet activity   Assist Walk 50 feet with 2 turns activity did not occur: Safety/medical concerns         Walk 150 feet activity   Assist Walk 150 feet activity did not occur: Safety/medical concerns         Walk 10 feet on uneven surface  activity   Assist Walk 10 feet on uneven surfaces activity did not occur: Safety/medical concerns         Wheelchair     Assist Is the patient using a wheelchair?: No             Wheelchair 50 feet with 2 turns activity    Assist            Wheelchair 150 feet activity     Assist          Blood pressure 119/81, pulse 90, temperature 97.9 F (36.6  C), temperature source Oral, resp. rate 18, height 5\' 2"  (1.575 m), weight 98 kg, SpO2 92 %.  Medical Problem List and Plan: 1. 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  Impaired mobility: -DVT/anticoagulation:  Pharmaceutical: Continue Lovenox. Monitoring creatinine on Thursdays             -antiplatelet therapy: Aspirin 81 mg daily and Brilinta 90 mg twice daily 3. Pain Management:   Tylenol as needed for vascular headache  12/16 much improved 4. Depression/insomnia/agitation: Continue Zoloft 100 mg daily, melatonin 3 mg nightly             -antipsychotic agents: Seroquel    -mood remains positive  -will wean seroquel beginning Monday 12/12   seroquel now 25mg  qhs prn  -appreciate neuropsych visit 5. Neuropsych: This patient is capable of making decisions on his own behalf. 6. Skin/Wound Care: Routine skin checks 7. Fluids/Electrolytes/Nutrition: encourage PO  -hyponatremia 137 12/12   -recheck 12/20  8.  Hyperlipidemia.  Continue Lipitor 9.  History of alcohol and marijuana use.  Urine drug screen positive marijuana.  Counseling 10. Morbid Obesity- BMI 39.52- counseling and diet education 11.  Transaminitis  hep panel negative, HIV neg   LFT's stable, still sl elevated---since he's here will recheck tomorrow 12. Mild leukocytosis: Resolved  Afebrile  -no s/s infection 14.  Slow transit constipation  -backed off laxatives, stool more formed today 12/16    LOS: 32 days A FACE TO FACE EVALUATION WAS PERFORMED  1/21 09/06/2021, 11:33 AM

## 2021-09-07 LAB — COMPREHENSIVE METABOLIC PANEL
ALT: 60 U/L — ABNORMAL HIGH (ref 0–44)
AST: 31 U/L (ref 15–41)
Albumin: 3.5 g/dL (ref 3.5–5.0)
Alkaline Phosphatase: 83 U/L (ref 38–126)
Anion gap: 9 (ref 5–15)
BUN: 15 mg/dL (ref 6–20)
CO2: 24 mmol/L (ref 22–32)
Calcium: 9.3 mg/dL (ref 8.9–10.3)
Chloride: 104 mmol/L (ref 98–111)
Creatinine, Ser: 0.82 mg/dL (ref 0.61–1.24)
GFR, Estimated: >60 mL/min (ref >60)
Glucose, Bld: 95 mg/dL (ref 70–99)
Potassium: 4 mmol/L (ref 3.5–5.1)
Sodium: 137 mmol/L (ref 135–145)
Total Bilirubin: 0.6 mg/dL (ref 0.3–1.2)
Total Protein: 7.1 g/dL (ref 6.5–8.1)

## 2021-09-07 NOTE — Progress Notes (Signed)
Patient ID: Roy [name], male   DOB: 12/06/1995, 25 y.o.   MRN: [id number] ° °09/08/21- Pt set up for [facility] medication assistance program.  ° °09/09/2021-  °SW waiting on updates from [facility] about charity referral. ° °SW met with pt and pt girlfriend [name] in room to review discharge. SW informed waiting on confirmation about loaner w/c. SW also informed on transportation will be set up through [facility] [employer]. Requests for 1pm discharge time to leave. SW informed co-workers will follow-up that morning. [name] inquired if transportation was available to caregiver and SW informed it is not. She intends to work on other transportation options home. During PHQ9 questionnaire, pt expressed feeling like he is a burden to family. SW processed with pt thoughts and feelings, and how  to manage depression at this time.  ° °*Charity HH referral declined due to hx of substance abuse. SW informed medical team requesting HEP for pt.  ° °SW spoke with pt sister [name] to provide updates and review pt d/c reporting waiting on updates about loaner w/c, unable to secure charity HH and will be sent home with HEP, transportation will be arranged for his discharge home, and [facility] medication assistance program. SW encouraged her to be prepared for a phone call from [facility] pharmacy to discuss cost. SW reminded about upcoming hospital f/u appt at [facility].  ° °SW sent transportation referral to [facility].  ° ° , MSW, LCSWA °Office: [phone] °Cell: [phone] °Fax: [phone]  °

## 2021-09-07 NOTE — Progress Notes (Signed)
Physical Therapy Session Note  Patient Details  Name: Roy [name] MRN: [id number] Date of Birth: 1996/06/27  Today's Date: 09/07/2021 PT Individual Time: 0932-1000 and 1302-1359 PT Individual Time Calculation (min): 28 min and 57 min  Short Term Goals: Week 4:  PT Short Term Goal 1 (Week 4): STGs=LTGs  Skilled Therapeutic Interventions/Progress Updates:     Pt received seated in WC and agrees to therapy. No complaint of pain. WC transport to gym for time management. Pt performs WC propulsion during session to work on functional mobility as well as bilateral lower extremity coordination. Initially pt propels WC forward, utilizing bilateral dorsiflexors as well as hamstrings to "pull" WC. PT provides verbal cueing for increased knee extension L>R, as well as cueing for navigation of WC as pt has some difficulty maintaining straight line propulsion. Pt propels x100' utilizing this method. Following seated rest break, pt performs x50' WC propulsion, "pushing" WC with bilateral lower extremities going backward. Pt has more difficulty pushing versus pulling, requiring more frequent minA redirection of WC. Following seated rest break, pt "pulls" WC with bilateral lower extremities x150' including 90 degree turns. Left seated in WC with alarm intact and all needs within reach.  2nd Session: Pt received seated in [facility] and agrees to therapy. No complaint of pain. WC transport to gym for time management. Pt performs multiple reps of sit to stand during session with minA overall, requiring cues for anterior weight transition and upright gaze to improve posture and body mechanics. Session focused on gait training, primarily without AD. Pt ambulates x50', initially with modA facilitating trunk control, lateral and anterior weight shifting, and cueing for sequencing, but with fatigue and corresponding decline in motor planning, requiring maxA for stability and trunk control. Following extended seated rest  breaks, pt performs additional bouts of ambulation, 25', 25', and 25'. Again, pt requires modA for majority and occasional instanced of maxA when motor planning and sequencing of steps breaks down, especially during turns and transitional movements. Pt attempts ambulation with RW but has difficulty with managing device and balance and gait pattern suffer with addition of AD. WC transport back to room. Left seated with all needs within reach.  Therapy Documentation Precautions:  Precautions Precautions: Fall Precaution Comments: R hemi, diplopia/visual deficits Restrictions Weight Bearing Restrictions: No   Therapy/Group: Individual Therapy  Beau Fanny, PT, DPT 09/07/2021, 4:19 PM

## 2021-09-07 NOTE — Progress Notes (Signed)
Speech Language Pathology Daily Session Note  Patient Details  Name: Roy [name] MRN: [id number] Date of Birth: 06/02/1996  Today's Date: 09/07/2021 SLP Individual Time: 1100-1130 SLP Individual Time Calculation (min): 30 min  Short Term Goals: Week 5: SLP Short Term Goal 1 (Week 5): STGS=LTGs due to ELOS  Skilled Therapeutic Interventions: Skilled treatment session focused on communication goals. SLP facilitated session by providing Min A verbal cues for patient to self-monitor and correct phonemic errors at the sentence level during a verbal description task. Patient also sequenced 4 and 6 step picture cards prior to describing the actions within the pictures with extra time and supervision level verbal cues. Patient handed off to OT. Continue with current plan of care.      Pain Pain Assessment Pain Scale: 0-10 Pain Score: 0-No pain  Therapy/Group: Individual Therapy  Chrsitopher Wik 09/07/2021, 11:48 AM

## 2021-09-07 NOTE — Progress Notes (Signed)
PROGRESS NOTE   Subjective/Complaints: No new issues. SO again mentioned tremor. Pt doesn't feel that it bothers him that often  ROS: Patient denies fever, rash, sore throat, blurred vision, nausea, vomiting, diarrhea, cough, shortness of breath or chest pain, joint or back pain, headache, or mood change.   Objective:   No results found. No results for input(s): WBC, HGB, HCT, PLT in the last 72 hours.    Recent Labs    09/07/21 0520  NA 137  K 4.0  CL 104  CO2 24  GLUCOSE 95  BUN 15  CREATININE 0.82  CALCIUM 9.3       Intake/Output Summary (Last 24 hours) at 09/07/2021 1119 Last data filed at 09/07/2021 0757 Gross per 24 hour  Intake 720 ml  Output 0 ml  Net 720 ml        Physical Exam: Vital Signs Blood pressure 115/62, pulse 89, temperature (!) 97.3 F (36.3 C), resp. rate 17, height 5\' 2"  (1.575 m), weight 98 kg, SpO2 100 %.   Constitutional: No distress . Vital signs reviewed. HEENT: NCAT, EOMI, oral membranes moist Neck: supple Cardiovascular: RRR without murmur. No JVD    Respiratory/Chest: CTA Bilaterally without wheezes or rales. Normal effort    GI/Abdomen: BS +, non-tender, non-distended Ext: no clubbing, cyanosis, or edema Psych: pleasant and cooperative  Neuro: Alert Speech nasal but  much clearer, syncs up breathing better Motor: RUE 5/5, RLE 4-5/5 LUE and LLE 3+ to 4/5- persistent,   limb ataxia with FTN/HTS- L>R. No tremor seen  Assessment/Plan: 1. Functional deficits which require 3+ hours per day of interdisciplinary therapy in a comprehensive inpatient rehab setting. Physiatrist is providing close team supervision and 24 hour management of active medical problems listed below. Physiatrist and rehab team continue to assess barriers to discharge/monitor patient progress toward functional and medical goals  Care Tool:  Bathing    Body parts bathed by patient: Right arm, Chest,  Abdomen, Front perineal area, Right upper leg, Left upper leg, Face   Body parts bathed by helper: Left arm, Buttocks, Right lower leg, Left lower leg     Bathing assist Assist Level: Moderate Assistance - Patient 50 - 74%     Upper Body Dressing/Undressing Upper body dressing   What is the patient wearing?: Pull over shirt    Upper body assist Assist Level: Minimal Assistance - Patient > 75%    Lower Body Dressing/Undressing Lower body dressing      What is the patient wearing?: Incontinence brief, Pants     Lower body assist Assist for lower body dressing: Maximal Assistance - Patient 25 - 49%     Toileting Toileting    Toileting assist Assist for toileting: Total Assistance - Patient < 25%     Transfers Chair/bed transfer  Transfers assist  Chair/bed transfer activity did not occur: Safety/medical concerns  Chair/bed transfer assist level: Moderate Assistance - Patient 50 - 74%     Locomotion Ambulation   Ambulation assist   Ambulation activity did not occur: Safety/medical concerns  Assist level: 2 helpers (min/mod A of 1 & +2 w/c follow) Assistive device: Parallel bars Max distance: 3ft   Walk 10  feet activity   Assist     Assist level: 2 helpers Assistive device: Hand held assist   Walk 50 feet activity   Assist Walk 50 feet with 2 turns activity did not occur: Safety/medical concerns         Walk 150 feet activity   Assist Walk 150 feet activity did not occur: Safety/medical concerns         Walk 10 feet on uneven surface  activity   Assist Walk 10 feet on uneven surfaces activity did not occur: Safety/medical concerns         Wheelchair     Assist Is the patient using a wheelchair?: Yes Type of Wheelchair: Manual    Wheelchair assist level: Minimal Assistance - Patient > 75% Max wheelchair distance: 100    Wheelchair 50 feet with 2 turns activity    Assist        Assist Level: Minimal Assistance -  Patient > 75%   Wheelchair 150 feet activity     Assist  Wheelchair 150 feet activity did not occur: Safety/medical concerns       Blood pressure 115/62, pulse 89, temperature (!) 97.3 F (36.3 C), resp. rate 17, height 5\' 2"  (1.575 m), weight 98 kg, SpO2 100 %.  Medical Problem List and Plan: 1. 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 Interdisciplinary team conference today to discuss goals, barriers to discharge, and dc planning.      -ELOS 12/23  -tremors seem sporadic at best--any medication to rx may cause more problems than it helps 2.  Impaired mobility: -DVT/anticoagulation:  Pharmaceutical: Continue Lovenox. Monitoring creatinine on Thursdays             -antiplatelet therapy: Aspirin 81 mg daily and Brilinta 90 mg twice daily 3. Pain Management:   Tylenol as needed for vascular headache  12/16 much improved 4. Depression/insomnia/agitation: Continue Zoloft 100 mg daily, melatonin 3 mg nightly             -antipsychotic agents: Seroquel    -mood remains positive  -will wean seroquel beginning Monday 12/12   seroquel now 25mg  qhs prn  -appreciate neuropsych visit 5. Neuropsych: This patient is capable of making decisions on his own behalf. 6. Skin/Wound Care: Routine skin checks 7. Fluids/Electrolytes/Nutrition: encourage PO  -hyponatremia 137 12/12--> stable 12/20  8.  Hyperlipidemia.  Continue Lipitor 9.  History of alcohol and marijuana use.  Urine drug screen positive marijuana.  Counseling 10. Morbid Obesity- BMI 39.52- counseling and diet education 11.  Transaminitis  hep panel negative, HIV neg   LFT's essentially normal today 12/20 12. Mild leukocytosis: Resolved  Afebrile  -no s/s infection 14.  Slow transit constipation  -moving  bowels    LOS: 33 days A FACE TO FACE EVALUATION WAS PERFORMED  1/21 09/07/2021, 11:19 AM

## 2021-09-07 NOTE — Progress Notes (Signed)
Patient ID: Roy [name], male   DOB: [birth year], 25 y.o.   MRN: [id number]  SW sent charity [facility] referral to [facility] and for HHPT/OT/SLP. SW waiting on follow-up.  SW made efforts to make contact with pt sister Roy [name] to provide updates from team conference, and d/c date remains. SW left message providing updates and asked to confirm if pt has RW. SW also informed will confirm if the loaner w/c from [facility] will be provided to him until his insurance is affective.   Cecile Sheerer, MSW, LCSWA Office: [phone] Cell: [phone] Fax: [phone]

## 2021-09-07 NOTE — Progress Notes (Signed)
Occupational Therapy Session Note  Patient Details  Name: Roy [name] MRN: [id number] Date of Birth: November 18, 1995  Today's Date: 09/07/2021 OT Individual Time: 1130-1200 OT Individual Time Calculation (min): 30 min    Short Term Goals: Week 5:  OT Short Term Goal 1 (Week 5): Continue working on established LTGs  Skilled Therapeutic Interventions/Progress Updates:    Pt received from SLP in w/c. Pt agreeable to OT session. No c/o pain. He worked on standing level core stabilization and BUE functional reaching/grasping with weighted balls. Pt then completed BUE weightbearing push ups in standing on an elevated table to challenge scapular stability and standing balance. He required min cueing for proper body positioning/body mechanics, and CGA for balance support. He returned to sitting in his w/c and completed closed chain functional overhead reaching with a 2lb dowel. He was able to initiate and carry through the motor plan with CGA. He returned to his room and was left sitting up with all needs met, chair alarm set .  Therapy Documentation Precautions:  Precautions Precautions: Fall Precaution Comments: R hemi, diplopia/visual deficits Restrictions Weight Bearing Restrictions: No   Therapy/Group: Individual Therapy  Curtis Sites 09/07/2021, 6:28 AM

## 2021-09-07 NOTE — Progress Notes (Signed)
Occupational Therapy Session Note  Patient Details  Name: Roy [name] MRN: [id number] Date of Birth: Aug 24, 1996  Today's Date: 09/07/2021 OT Individual Time: 9233-0076 OT Individual Time Calculation (min): 55 min    Short Term Goals: Week 5:  OT Short Term Goal 1 (Week 5): Continue working on established LTGs  Skilled Therapeutic Interventions/Progress Updates:    Pt greeted semi-reclined in bed and agreeable to OT treatment session. Pt completed bed mobility from flat bed using bed rails and CGA. Pt completed squat-pivot using bobath method and min A. Stand-pivot into shower with heavy use of grab bars and facilitation at hips to elicit pivot, but overall min A with verbal and tactile cues. Bathing completed from tub bench using B UE's for neuro re-ed. Min A sit<>stand while OT washed buttocks. Blocked practice for dressing tasks today. Pt able to reach forward enough to get feet into pants, then pt able to use B hands to grasp pants and help pull up to knees, min A from OT to get feet all the way through as toenails catching on pants. Min cues for LE positioning prior to sit<>stand at the sink, but min A. Forced use of B UE's alternating to pull up pants. OT set-up pt with his shirt and with extended time and effort, pt able to put head through hole and thread B UE's!! Pt finished up grooming tasks at the sink with set-up A. Pt left seated in wc at the sink with girlfriend present and needs met.   Therapy Documentation Precautions:  Precautions Precautions: Fall Precaution Comments: R hemi, diplopia/visual deficits Restrictions Weight Bearing Restrictions: No Pain: Pain Assessment Pain Scale: 0-10 Pain Score: 0-No pain  Therapy/Group: Individual Therapy  Valma Cava 09/07/2021, 9:12 AM

## 2021-09-07 NOTE — Patient Care Conference (Signed)
Inpatient RehabilitationTeam Conference and Plan of Care Update Date: 09/07/2021   Time: 10:53 AM    Patient Name: Roy [name]      Medical Record Number: [id number]  Date of Birth: Jan 10, 1996 Sex: Male         Room/Bed: 4W20C/4W20C-01 Payor Info: Payor: MEDICAID POTENTIAL / Plan: MEDICAID POTENTIAL / Product Type: *No Product type* /    Admit Date/Time:  08/05/2021  2:14 PM  Primary Diagnosis:  Posterior circulation stroke [facility])  Hospital Problems: Principal Problem:   Posterior circulation stroke (HCC) Active Problems:   Leukocytosis   Transaminitis   Dyslipidemia   Hyponatremia   Vascular headache   Sleep disturbance   Slow transit constipation   Cognitive deficits    Expected Discharge Date: Expected Discharge Date: 09/10/21  Team Members Present: Physician leading conference: Dr. Faith Rogue Social Worker Present: Cecile Sheerer, LCSWA Nurse Present: Kennyth Arnold, RN PT Present: Malachi Pro, PT OT Present: Kearney Hard, OT SLP Present: Feliberto Gottron, SLP PPS Coordinator present : Fae Pippin, SLP     Current Status/Progress Goal Weekly Team Focus  Bowel/Bladder   continent b/b  Maintain continence  toilet as needed   Swallow/Nutrition/ Hydration             ADL's             Mobility             Communication   Min A-80-90% intelligibile at sentence level  Min A  Family Education, use of speech strateiges   Safety/Cognition/ Behavioral Observations  N/A  At Baseline level of cognitive functioning  N/A   Pain   no complaints of pain  Remain pain free.  assess q 4 hr and prn   Skin   CDI  Prevent new breakdowns.  assess skin q shift and prn     Discharge Planning:      Team Discussion: Doing great medically. MD did cerebellar exam. Continent B/B, recommend time toileting schedule at home. Has tremors and muscle spasms. Family education completed with sister. Girlfriend signed off to transfer patient. Doing great with WC mobility.  Not ready or safe to ambulate with family. BUE strength improving. 80-90% intelligible speech. Transport home to be [facility] transport.  Patient on target to meet rehab goals: yes, min assist overall.  *See Care Plan and progress notes for long and short-term goals.   Revisions to Treatment Plan:  Adjusting medications and finalizing discharge plans.  Teaching Needs: Family education, medication/pain management, safety awareness, transfer training, etc.  Current Barriers to Discharge: Incontinence and Medication compliance   Possible Resolutions to Barriers: Family education Continue timed toileting schedule at home    Medical Summary Current Status: improving ataxia, speech. improving continence. pain better.  Barriers to Discharge: Medical stability   Possible Resolutions to [employer] Focus: daily assessment of patient data, labs, finalize medication regimen for dc   Continued Need for Acute Rehabilitation Level of Care: The patient requires daily medical management by a physician with specialized training in physical medicine and rehabilitation for the following reasons: Direction of a multidisciplinary physical rehabilitation program to maximize functional independence : Yes Medical management of patient stability for increased activity during participation in an intensive rehabilitation regime.: Yes Analysis of laboratory values and/or radiology reports with any subsequent need for medication adjustment and/or medical intervention. : Yes   I attest that I was present, lead the team conference, and concur with the assessment and plan of the team.   Kennyth Arnold  G 09/07/2021, 3:20 PM

## 2021-09-08 NOTE — Progress Notes (Signed)
Occupational Therapy Session Note  Patient Details  Name: Roy [name] MRN: [id number] Date of Birth: 1996-05-07  Today's Date: 09/08/2021 OT Individual Time: 8469-6295 OT Individual Time Calculation (min): 70 min    Short Term Goals: Week 5:  OT Short Term Goal 1 (Week 5): Continue working on established LTGs  Skilled Therapeutic Interventions/Progress Updates:    Pt in shower with girlfriend assisting with bathing. Stedy used for transfer from TTB to w/c. Dressing with sit<>stand from w/c at sink. UB dressing with CGA and extra time. Donning pants with mod A. Sit<>stand from w/c and standing balance with min A. Pt requires more then a reasonable amount of time to initiate and complete tasks. Pt transitioned to day room for table activities/tasks with colored peg board and large lego blocks. Pt completed two tasks with each modality. Focus on BUE gross/FMC control. Pt required more then a reasonable amount of time to complete tasks. Pt returned to room and remained in w/c with all needs within reach. Girlfriend present. Belt alarm activated.   Therapy Documentation Precautions:  Precautions Precautions: Fall Precaution Comments: R hemi, diplopia/visual deficits Restrictions Weight Bearing Restrictions: No  Pain: Pain Assessment Pain Scale: 0-10 Pain Score: 0-No pain    Therapy/Group: Individual Therapy  Rich Brave 09/08/2021, 9:29 AM
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
## 2021-09-08 NOTE — Progress Notes (Signed)
Inpatient Rehabilitation Care Coordinator Assessment and Plan Patient Details  Name: Roy [name] MRN: [id number] Date of Birth: [birth year]  Today's Date: 09/08/2021  Hospital Problems: Principal Problem:   Posterior circulation stroke [facility]) Active Problems:   Leukocytosis   Transaminitis   Dyslipidemia   Hyponatremia   Vascular headache   Sleep disturbance   Slow transit constipation   Cognitive deficits  Past Medical History: History reviewed. No pertinent past medical history. Past Surgical History:  Past Surgical History:  Procedure Laterality Date   IR ANGIO EXTRACRAN SEL COM CAROTID INNOMINATE UNI R MOD SED  07/27/2021   IR CT HEAD LTD  07/27/2021   IR INTRA CRAN STENT  07/27/2021   IR PERCUTANEOUS ART THROMBECTOMY/INFUSION INTRACRANIAL INC DIAG ANGIO  07/27/2021   RADIOLOGY WITH ANESTHESIA N/A 07/27/2021   Procedure: IR WITH ANESTHESIA;  Surgeon: Julieanne Cotton, MD;  Location: [facility] OR;  Service: Radiology;  Laterality: N/A;   Social History:  reports that he has never smoked. He has never used smokeless tobacco. He reports that he does not currently use alcohol. He reports that he does not currently use drugs.  Family / Support Systems Marital Status: Single Patient Roles: Partner Spouse/Significant Other: Chemical engineer (girlfriend) Children: N/A Other Supports: various family members Anticipated Caregiver: sister Roy [name] Ability/Limitations of Caregiver: Pt sister does not have transportation. 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 - How often do you need to have someone help you when you read instructions, pamphlets, or other written material from your doctor or pharmacy?: Never Writes: Yes Employment Status: Unemployed Investment banker, operational Issues: Denies Guardian/Conservator: N/A   Abuse/Neglect Abuse/Neglect Assessment Can Be Completed: Yes Physical Abuse: Denies Verbal Abuse: Denies Sexual Abuse: Denies Exploitation of patient/patient's resources: Denies Self-Neglect: Denies  Patient response to: Social Isolation - How often do you feel lonely or isolated from those around you?: Never  Emotional Status [name] affect, behavior and adjustment status: Pt in good spirits at time of visit. Pt admits to being depressed about current physical condition and not being able to help himself and being a burden to others Recent Psychosocial Issues: Pt admits to depression Psychiatric History: Pt admits to depression sometimes, but no diagnosis. Substance Abuse History: Pt denies. per EMR, marijuana and alcohol use.  Patient / Family Perceptions, Expectations & Goals Pt/Family understanding of illness & functional limitations: PT and famly have a general understanding of pt care needs Premorbid pt/family roles/activities: INdependent Anticipated changes in roles/activities/participation: Assistance with ADLs/IADLs  Recruitment consultant Agencies: None Premorbid Home Care/DME Agencies: None Transportation available at discharge: [facility] transportation Is the patient able to respond to transportation needs?: No In the past 12 months, has lack of transportation kept you from medical appointments or from getting medications?: No In the past 12 months, has lack of transportation kept you from meetings, work, or from getting things needed for daily living?: No Resource referrals recommended: Neuropsychology  Discharge Planning Living Arrangements: Other relatives Support Systems: Other relatives Type of Residence: Private residence Insurance Resources: Customer service manager Resources: Family Support Financial Screen Referred: Yes Living Expenses: Lives with family Money Management: Family Does the patient have any  problems obtaining your medications?: No Home Management: Pt helped manage home care needs Patient/Family Preliminary Plans: Sister and family to assume role Care Coordinator Barriers to Discharge: Decreased caregiver support,  Insurance for SNF coverage, Lack of/limited family support Care Coordinator Anticipated Follow Up Needs: HH/OP  Clinical Impression SW completed assessment with pt now that he is able to better communicate. Pt is not a Cytogeneticist. No HCPOA. No DME.  Aylah Yeary A Anahita Cua 09/08/2021, 11:29 PM

## 2021-09-08 NOTE — Progress Notes (Signed)
Speech Language Pathology Daily Session Note  Patient Details  Name: Roy [name] MRN: [id number] Date of Birth: [birth year]  Today's Date: 09/08/2021 SLP Individual Time: 1415-1500 SLP Individual Time Calculation (min): 45 min  Short Term Goals: Week 5: SLP Short Term Goal 1 (Week 5): STGS=LTGs due to ELOS  Skilled Therapeutic Interventions: Skilled treatment session focused on communication goals. Patient expressing frustration with difficulty controlling voice volume at times which can convey the wrong message to an unfamiliar listener. SLP facilitated session by implementing a rating scale to subjectively assess voice volume in which patient could consistently speak at the "normal" volume rate of 2 vs a 5. SLP also provided strategies to implement to minimize communication breakdowns with both familiar and unfamiliar listening partners. [name]. Continue with current plan of care.    Pain No/Denies Pain   Therapy/Group: Individual Therapy  Meah Jiron 09/08/2021, 3:03 PM

## 2021-09-08 NOTE — Progress Notes (Signed)
Physical Therapy Session Note  Patient Details  Name: Roy [name] MRN: [id number] Date of Birth: 1996-07-01  Today's Date: 09/08/2021 PT Individual Time: 1120-1200 and 6301-6010 PT Individual Time Calculation (min): 40 min and 56 min  Short Term Goals: Week 4:  PT Short Term Goal 1 (Week 4): STGs=LTGs  Skilled Therapeutic Interventions/Progress Updates:     Pt received seated in WC and agrees to therapy. No complaint of pain. WC transport to gym for time management. PT provides education and demonstration of bumping WC up 7 inch step. [name] significant other then performs WC management and bump up onto step with cues from PT for body mechanics and sequencing. Pt then performs sit to stand with minA and cues for anterior weight shifting and sequencing. Pt ambulates x25' with no AD and modA to facilitate stability, lateral weight shifting, and tactile cueing at hips for increased glute contraction. Pt requires maxA toward end of ambulation due to deterioration of motor planning and inadequate weight shifting. +2 required to bring [facility] for seated rest break.  Pt performs 3-4 reps of sit to stand in parallel bars without physical assistance required. Pt turns to face mirror with CGA and holds onto bar with both hands, performing x10 reps heel raises for strengthening and NMR. Pt also performs sidestepping with CGA/minA and cues for posture and weight shifting.   Pt left seated in WC with alarm intact and all needs within reach.  2nd Session: Pt received seated in [facility] and agrees to therapy. No complaint of pain. Pt is very emotional about current situation regarding functional deficits, discharge disposition, and feeling that he is alone and has nobody who understands his situation. PT provides active listening and discusses coping mechanisms with pt. Rehab tech also present and providing emotional support for pt. PT educates pt's significant other on importance of patience and understanding as pt is  going through very difficulty period in life. Pt transported outside via [facility] for change in environment. PT discusses safety with mobility at home including safe exercises for pt to perform himself and with assistance from family. Pt and girlfriend verbalize understanding. Pt transported back to room. Left seated in WC with alarm intact and all needs within reach.  Therapy Documentation Precautions:  Precautions Precautions: Fall Precaution Comments: R hemi, diplopia/visual deficits Restrictions Weight Bearing Restrictions: No    Therapy/Group: Individual Therapy  Beau Fanny, PT, DPT 09/08/2021, 12:48 PM
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
## 2021-09-08 NOTE — Progress Notes (Signed)
Inpatient Rehabilitation Discharge Medication Review by a Pharmacist  A complete drug regimen review was completed for this patient to identify any potential clinically significant medication issues.  High Risk Drug Classes Is patient taking? Indication by Medication  Antipsychotic Yes PRN Seroquel for agitation/sleep  Anticoagulant No   Antibiotic No   Opioid No   Antiplatelet Yes Brilinta/Aspirin s/p stent  Hypoglycemics/insulin No   Vasoactive Medication No   Chemotherapy No   Other Yes Zoloft for anxiety     Type of Medication Issue Identified Description of Issue Recommendation(s)  Drug Interaction(s) (clinically significant)     Duplicate Therapy     Allergy     No Medication Administration End Date     Incorrect Dose     Additional Drug Therapy Needed     Significant med changes from prior encounter (inform family/care partners about these prior to discharge).    Other       Clinically significant medication issues were identified that warrant physician communication and completion of prescribed/recommended actions by midnight of the next day:  No  Pharmacist comments: None  Time spent performing this drug regimen review (minutes):  20 minutes   Elwin Sleight 09/08/2021 9:23 AM

## 2021-09-09 ENCOUNTER — Other Ambulatory Visit (HOSPITAL_COMMUNITY): Payer: Self-pay

## 2021-09-09 MED ORDER — ATORVASTATIN CALCIUM 40 MG PO TABS
40.0000 mg | ORAL_TABLET | Freq: Every day | ORAL | 0 refills | Status: DC
  Filled 2021-09-09: qty 30, 30d supply, fill #0

## 2021-09-09 MED ORDER — TICAGRELOR 90 MG PO TABS
90.0000 mg | ORAL_TABLET | Freq: Two times a day (BID) | ORAL | 0 refills | Status: DC
  Filled 2021-09-09: qty 60, 30d supply, fill #0

## 2021-09-09 MED ORDER — SERTRALINE HCL 100 MG PO TABS
100.0000 mg | ORAL_TABLET | Freq: Every day | ORAL | 0 refills | Status: DC
  Filled 2021-09-09: qty 30, 30d supply, fill #0

## 2021-09-09 MED ORDER — QUETIAPINE FUMARATE 25 MG PO TABS
25.0000 mg | ORAL_TABLET | Freq: Every evening | ORAL | 0 refills | Status: DC | PRN
  Filled 2021-09-09: qty 20, 20d supply, fill #0

## 2021-09-09 MED ORDER — MELATONIN 5 MG PO TABS
5.0000 mg | ORAL_TABLET | Freq: Every day | ORAL | 0 refills | Status: DC
  Filled 2021-09-09: qty 30, 30d supply, fill #0

## 2021-09-09 MED ORDER — FOLIC ACID 1 MG PO TABS
1.0000 mg | ORAL_TABLET | Freq: Every day | ORAL | 0 refills | Status: DC
  Filled 2021-09-09: qty 30, 30d supply, fill #0

## 2021-09-09 MED ORDER — ACETAMINOPHEN 325 MG PO TABS: 650.0000 mg | ORAL_TABLET | ORAL | Status: DC | PRN

## 2021-09-09 MED ORDER — POLYETHYLENE GLYCOL 3350 17 G PO PACK: 17.0000 g | PACK | Freq: Two times a day (BID) | ORAL | 0 refills | Status: DC
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
## 2021-09-09 NOTE — Progress Notes (Signed)
PROGRESS NOTE   Subjective/Complaints: Pt in good spirits. No new problems.   ROS: Patient denies fever, rash, sore throat, blurred vision, nausea, vomiting, diarrhea, cough, shortness of breath or chest pain, joint or back pain, headache, or mood change.   Objective:   No results found. No results for input(s): WBC, HGB, HCT, PLT in the last 72 hours.    Recent Labs    09/07/21 0520  NA 137  K 4.0  CL 104  CO2 24  GLUCOSE 95  BUN 15  CREATININE 0.82  CALCIUM 9.3       Intake/Output Summary (Last 24 hours) at 09/09/2021 1120 Last data filed at 09/09/2021 0700 Gross per 24 hour  Intake 480 ml  Output --  Net 480 ml        Physical Exam: Vital Signs Blood pressure 105/64, pulse 91, temperature 98.7 F (37.1 C), resp. rate 20, height 5\' 2"  (1.575 m), weight 98 kg, SpO2 100 %.   Constitutional: No distress . Vital signs reviewed. HEENT: NCAT, EOMI, oral membranes moist Neck: supple Cardiovascular: RRR without murmur. No JVD    Respiratory/Chest: CTA Bilaterally without wheezes or rales. 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 No tremor seen  Assessment/Plan: 1. Functional deficits which require 3+ hours per day of interdisciplinary therapy in a comprehensive inpatient rehab setting. Physiatrist is providing close team supervision and 24 hour management of active medical problems listed below. Physiatrist and rehab team continue to assess barriers to discharge/monitor patient progress toward functional and medical goals  Care Tool:  Bathing    Body parts bathed by patient: Right arm, Chest, Abdomen, Front perineal area, Right upper leg, Left upper leg, Face, Left arm, Right lower leg, Left lower leg   Body parts  bathed by helper: Buttocks     Bathing assist Assist Level: Minimal Assistance - Patient > 75%     Upper Body Dressing/Undressing Upper body dressing   What is the patient wearing?: Pull over shirt    Upper body assist Assist Level: Contact Guard/Touching assist    Lower Body Dressing/Undressing Lower body dressing      What is the patient wearing?: Pants     Lower body assist Assist for lower body dressing: Moderate Assistance - Patient 50 - 74%     Toileting Toileting    Toileting assist Assist for toileting: Maximal Assistance - Patient 25 - 49%     Transfers Chair/bed transfer  Transfers assist  Chair/bed transfer activity did not occur: Safety/medical concerns  Chair/bed transfer assist level: Moderate Assistance - Patient 50 - 74%     Locomotion Ambulation   Ambulation assist   Ambulation activity did not occur: Safety/medical concerns  Assist level: 2 helpers (min/mod A of 1 & +2 w/c follow) Assistive device: Parallel bars Max distance: 29ft   Walk 10 feet activity   Assist     Assist level: 2 helpers Assistive device: Hand held assist   Walk 50 feet activity   Assist  Walk 50 feet with 2 turns activity did not occur: Safety/medical concerns         Walk 150 feet activity   Assist Walk 150 feet activity did not occur: Safety/medical concerns         Walk 10 feet on uneven surface  activity   Assist Walk 10 feet on uneven surfaces activity did not occur: Safety/medical concerns         Wheelchair     Assist Is the patient using a wheelchair?: Yes Type of Wheelchair: Manual    Wheelchair assist level: Minimal Assistance - Patient > 75% Max wheelchair distance: 100    Wheelchair 50 feet with 2 turns activity    Assist        Assist Level: Minimal Assistance - Patient > 75%   Wheelchair 150 feet activity     Assist  Wheelchair 150 feet activity did not occur: Safety/medical concerns       Blood  pressure 105/64, pulse 91, temperature 98.7 F (37.1 C), resp. rate 20, height 5\' 2"  (1.575 m), weight 98 kg, SpO2 100 %.  Medical Problem List and Plan: 1. 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  Impaired mobility: -DVT/anticoagulation:  Pharmaceutical: Continue Lovenox. Monitoring creatinine on Thursdays             -antiplatelet therapy: Aspirin 81 mg daily and Brilinta 90 mg twice daily 3. Pain Management:   Tylenol as needed for vascular headache  12/16 much improved 4. Depression/insomnia/agitation: Continue Zoloft 100 mg daily, melatonin 3 mg nightly             -antipsychotic agents: Seroquel    -mood remains positive  - seroquel now 25mg  qhs prn  -appreciate neuropsych visit 5. Neuropsych: This patient is capable of making decisions on his own behalf. 6. Skin/Wound Care: Routine skin checks 7. Fluids/Electrolytes/Nutrition: encourage PO  -hyponatremia 137 12/12--> stable 12/20  8.  Hyperlipidemia.  Continue Lipitor 9.  History of alcohol and marijuana use.  Urine drug screen positive marijuana.  Counseling 10. Morbid Obesity- BMI 39.52- counseling and diet education 11.  Transaminitis  hep panel negative, HIV neg   LFT's essentially normal  12/20 12. Mild leukocytosis: Resolved  Afebrile  -no s/s infection 14.  Slow transit constipation  -moving bowels    LOS: 35 days A FACE TO FACE EVALUATION WAS PERFORMED  1/21 09/09/2021, 11:20 AM

## 2021-09-09 NOTE — Progress Notes (Signed)
Occupational Therapy Session Note  Patient Details  Name: Roy [name] MRN: [id number] Date of Birth: 08-Sep-1996  Today's Date: 09/09/2021 OT Individual Time: 1100-1154 OT Individual Time Calculation (min): 54 min    Short Term Goals: Week 3:  OT Short Term Goal 1 (Week 3): Patient will complete 1 step of UB dressing task OT Short Term Goal 1 - Progress (Week 3): Met OT Short Term Goal 2 (Week 3): Pt will stand at the sink with mod A of 1 person in preparation for BADL task OT Short Term Goal 2 - Progress (Week 3): Not met OT Short Term Goal 3 (Week 3): Patient will use R UE to grasp toothbrush while applying toothpaste with L hand and overall mod A OT Short Term Goal 3 - Progress (Week 3): Not met Week 4:  OT Short Term Goal 1 (Week 4): Continue working on established LTGs downgraded to mod assist overall.  Skilled Therapeutic Interventions/Progress Updates:    Pt greeted at time of session up in wheelchair in day room with rehab tech attending Christmas musical, pt actively participating  in singing/dancing and gross motor movements throughout. At end of program, pt transported to ortho gym and pt self propelling short distances throughout session. Set up at [facility] for 3 rounds of dynamic sitting, reaching out of BOS, crossing midline, and using BUEs for hitting targets. 3 rounds included the following: single target round for 3:00 and 81% accuracy, second round letters A-Z in order with 49% accuracy and no cues for corrections, third round with red/white/blue rotation for 2:20. Switching to BUE strengthening and dynamic sitting challenge with 2# dowel hitting large therapy ball for 3x15 hits to improve GMC and reaction speed. Pt offered several times to use toilet, politely declined. Transported back to room and set up alarm on call bell in reach.   Therapy Documentation Precautions:  Precautions Precautions: Fall Precaution Comments: R hemi, diplopia/visual  deficits Restrictions Weight Bearing Restrictions: No    Therapy/Group: Individual Therapy  Viona Gilmore 09/09/2021, 7:25 AM

## 2021-09-09 NOTE — Progress Notes (Signed)
Speech Language Pathology Discharge Summary  Patient Details  Name: Roy [name] MRN: [id number] Date of Birth: 26-Dec-1995  Today's Date: 09/09/2021 SLP Individual Time: 1435-1500 SLP Individual Time Calculation (min): 25 min   Skilled Therapeutic Interventions:  Skilled treatment session focused on speech goals. SLP facilitated session by providing Min A verbal cues for patient to self-monitor and correct phonemic errors at the sentence level. Throughout conversation, patient was ~80-90% intelligible. Conversation focused on overall safety awareness and importance of having assistance with all transfers. Patient verbalized understanding. Patient left upright in wheelchair with girlfriend present and all needs within reach.    Patient has met 4 of 4 long term goals.  Patient to discharge at overall Supervision;Min level.   Reasons goals not met: N/A   Clinical Impression/Discharge Summary: Patient has made excellent gains and has met 4 of 4 LTGs this admission. Currently, patient is at his baseline level of cognitive functioning but requires extra time to complete functional and familiar tasks safely in regards to problem solving, attention, and awareness. Patient demonstrates a significant improvement in speech intelligibility at the sentence level but continues to require Min verbal cues for use of speech intelligibility strategies and to self-monitor and correct errors. Patient and family education is complete and patient will discharge home with 24 hour supervision from family. Patient would benefit from f/u SLP services to maximize his functional communication and reduce caregiver burden.   Care Partner:  Caregiver Able to Provide Assistance: Yes  Type of Caregiver Assistance: Physical;Cognitive  Recommendation:  24 hour supervision/assistance;Home Health SLP  Rationale for SLP Follow Up: Reduce caregiver burden;Maximize functional communication   Equipment: N/A   Reasons for  discharge: Discharged from hospital;Treatment goals met   Patient/Family Agrees with Progress Made and Goals Achieved: Yes    Gregor Dershem, [place] 09/09/2021, 6:16 AM

## 2021-09-09 NOTE — Progress Notes (Addendum)
Patient ID: Roy [name], male   DOB: 03-31-1996, 25 y.o.   MRN: [id number] [name] transportation has been set up for tomorrow at 1:30 pm via [facility]. Will let pt, girlfriend and staff know. Have confirmed address with pt and girlfriend

## 2021-09-09 NOTE — Progress Notes (Signed)
Physical Therapy Discharge Summary  Patient Details  Name: Roy [name] MRN: [id number] Date of Birth: 1996/05/11  Today's Date: 09/09/2021 PT Individual Time: 0902-1015 and 1401-1430 PT Individual Time Calculation (min): 73 min and 29 min   Patient has met 5 of 8 long term goals due to improved activity tolerance, improved balance, improved postural control, increased strength, and improved coordination.  Patient to discharge at a wheelchair level Supervision, though is able to ambulate short distances with modA. [name] girlfriend attended family education and is able to provide the necessary physical assistance at discharge.  Reasons goals not met: Pt continues to require at least modA for ambulation and car transfer due to ongoing deficit in motor planning, coordination, and proprioception.   Recommendation:  Patient will benefit from ongoing skilled PT services in home health setting to continue to advance safe functional mobility, address ongoing impairments in coordination, strength, balance, bed mobility, transfers ambulation, and minimize fall risk.  Equipment: Manual WC, RW  Reasons for discharge: treatment goals met and discharge from hospital  Patient/family agrees with progress made and goals achieved: Yes  Skilled Therapeutic Interventions: Pt received supine in bed and agrees to therapy. No complaint of pain. Pt performs supine to sit using bed features with verbal cues for body mechanics and sequencing. Squat pivot transfer to [facility] with minA and cues for head hips relationship, positioning hand/foot placement, and sequencing. WC transport to gym for time management. Pt performs stand pivot transfer to mat table with minA and same cueing. [name] girlfriend then provides assistance for pt while performing squat pivot back to [facility], with PT cueing for hand placement and body mechanics. Pt practices performing sit to stand transfers with RW to simulate toileting with girlfriend  providing assistance. Pt then self propels WC x150' with bilateral lower extremities and cues for optimal mechanics efficiency. Pt performs car transfer with modA and cues for weight shifting, hand placement, positioning, and sequencing. [name] girlfriend then performs WC management to bump WC up 7 inch step, with cues for safety and body mechanics. WC transport back to room. Left seated in WC with alarm intact and all needs within reach.  2nd Session Pt received seated in [facility] and agrees to therapy. No complaint of pain. WC transport to gym for time management. Pt performs stand pivot transfer to mat table with minA. Pt ambulates x50' with modA and multimodal cueing for lateral and anterior weight shifting, neutral trunk posture, step placement to maintain COG over BOS, sequencing of turns, and safe transition back to sitting. Pt is very fatigued following ambulation and takes extended seated rest break. Pt then performs x4 reps of sit to stand with close supervision, with cues for anterior weights shift, foot placement, and sequencing. Pt progresses to performing lateral weight shifting in standing and is able to perform with minA for stability.   Pt handed off to SLP.  PT Discharge Precautions/Restrictions Precautions Precautions: Fall Restrictions Weight Bearing Restrictions: No Pain Interference Pain Interference Pain Effect on Sleep: 1. Rarely or not at all Pain Interference with Therapy Activities: 1. Rarely or not at all Pain Interference with Day-to-Day Activities: 1. Rarely or not at all Vision/Perception  Perception Perception: Within Functional Limits Praxis Praxis: Impaired Praxis Impairment Details: Initiation;Motor planning;Perseveration  Cognition Overall Cognitive Status: Within Functional Limits for tasks assessed Arousal/Alertness: Awake/alert Orientation Level: Oriented X4 Memory: Appears intact Safety/Judgment: Appears intact Sensation Sensation Light Touch: Impaired  Detail Peripheral sensation comments: R side intact Light Touch Impaired Details: Impaired LLE;Impaired LUE Proprioception  Impaired Details: Impaired LUE;Impaired LLE Coordination Gross Motor Movements are Fluid and Coordinated: No Fine Motor Movements are Fluid and Coordinated: No Coordination and Movement Description: Decreased proprioception and smoothness of movement with L hemibody Motor  Motor Motor: Hemiplegia;Ataxia;Motor apraxia;Abnormal postural alignment and control Motor - Discharge Observations: Improved from eval  Mobility Bed Mobility Bed Mobility: Sit to Supine;Supine to Sit Supine to Sit: Supervision/Verbal cueing Sit to Supine: Supervision/Verbal cueing Transfers Transfers: Stand to Sit;Sit to Stand;Stand Pivot Transfers Sit to Stand: Supervision/Verbal cueing Stand to Sit: Supervision/Verbal cueing Stand Pivot Transfers: Minimal Assistance - Patient > 75% Stand Pivot Transfer Details: Tactile cues for initiation;Verbal cues for sequencing;Verbal cues for technique;Tactile cues for weight shifting;Tactile cues for sequencing;Manual facilitation for weight shifting Transfer (Assistive device): 1 person hand held assist Locomotion  Gait Ambulation: Yes Gait Assistance: Maximal Assistance - Patient 25-49% Gait Distance (Feet): 50 Feet Assistive device: 1 person hand held assist Gait Assistance Details: Tactile cues for posture;Verbal cues for sequencing;Verbal cues for gait pattern;Verbal cues for technique;Manual facilitation for weight shifting Gait Gait: Yes Gait Pattern: Impaired Gait Pattern: Ataxic Stairs / Additional Locomotion Stairs: Yes Stairs Assistance: Minimal Assistance - Patient > 75% Stair Management Technique: Two rails Number of Stairs: 4 Height of Stairs: 6 Curb: Minimal Assistance - Patient >75% Wheelchair Mobility Wheelchair Mobility: Yes Wheelchair Assistance: Chartered loss adjuster: Both lower  extermities Wheelchair Parts Management: Needs assistance Distance: 150'  Trunk/Postural Assessment  Cervical Assessment Cervical Assessment: Within Functional Limits Thoracic Assessment Thoracic Assessment: Within Functional Limits Lumbar Assessment Lumbar Assessment:  (posterior pelvic tilt) Postural Control Postural Control: Deficits on evaluation Trunk Control: Imapired Righting Reactions: Delayed and inadequate, improved from eval Protective Responses: Delayed and inadequate, improved from eval  Balance Balance Balance Assessed: Yes Static Sitting Balance Static Sitting - Balance Support: Feet supported;Bilateral upper extremity supported Static Sitting - Level of Assistance: 5: Stand by assistance Dynamic Sitting Balance Dynamic Sitting - Balance Support: Bilateral upper extremity supported;Feet supported Dynamic Sitting - Level of Assistance: 5: Stand by assistance Static Standing Balance Static Standing - Balance Support: During functional activity Static Standing - Level of Assistance: 4: Min assist Dynamic Standing Balance Dynamic Standing - Balance Support: Bilateral upper extremity supported;During functional activity Dynamic Standing - Level of Assistance: 3: Mod assist Extremity Assessment  RLE Assessment General Strength Comments: Grossly 4/5 LLE Assessment General Strength Comments: Grossly 4+/5    Breck Coons, PT, DPT 09/09/2021, 4:04 PM

## 2021-09-10 DIAGNOSIS — D72823 Leukemoid reaction: Secondary | ICD-10-CM
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
## 2021-09-10 NOTE — Progress Notes (Addendum)
Occupational Therapy Discharge Summary  Patient Details  Name: Roy [name] MRN: [id number] Date of Birth: 12-Aug-1996   Patient has met 12 of 13 long term goals due to improved activity tolerance, improved balance, postural control, ability to compensate for deficits, functional use of  RIGHT upper, RIGHT lower, LEFT upper, and LEFT lower extremity, improved attention, improved awareness, and improved coordination.  Patient to discharge at overall Min Assist/Mod A level.  Patient's care partner is independent to provide the necessary physical assistance at discharge.    Reasons goals not met: Patient still requires Max A for toileting tasks.  Recommendation:  Patient will benefit from ongoing skilled OT services in home health setting to continue to advance functional skills in the area of BADL, Reduce care partner burden, and Functional use of B UE's .  Equipment: RW, tub transfer bench, 3-in1 BSC, [place] wc  Reasons for discharge: treatment goals met and discharge from hospital  Patient/family agrees with progress made and goals achieved: Yes  OT Discharge Precautions/Restrictions  Precautions Precautions: Fall Precaution Comments: R hemi Restrictions Weight Bearing Restrictions: No Pain Pain Assessment Pain Scale: 0-10 Pain Score: 0-No pain ADL ADL Eating: Supervision/safety, Set up Grooming: Supervision/safety, Setup Upper Body Bathing: Contact guard Lower Body Bathing: Minimal assistance Upper Body Dressing: Minimal assistance Lower Body Dressing: Moderate assistance Toileting: Maximal assistance Toilet Transfer: Minimal assistance, Moderate assistance Charlaine Dalton) Vision Additional Comments: diploplia improved since eval Perception  Perception: Within Functional Limits Praxis Praxis: Impaired Praxis Impairment Details: Initiation;Motor planning;Perseveration Cognition Overall Cognitive Status: Within Functional Limits for tasks assessed Arousal/Alertness:  Awake/alert Orientation Level: Oriented X4 Year: 2023 Month: December Day of Week: Correct Attention: Sustained Sustained Attention: Appears intact Memory: Appears intact Immediate Memory Recall: Sock;Bed;Blue Memory Recall Sock: Without Cue Memory Recall Blue: Without Cue Memory Recall Bed: Without Cue Awareness: Appears intact Problem Solving: Appears intact Self Correcting: Appears intact Safety/Judgment: Appears intact Sensation Sensation Light Touch: Impaired Detail Light Touch Impaired Details: Impaired LLE;Impaired LUE Proprioception: Impaired Detail Proprioception Impaired Details: Impaired LUE;Impaired LLE Stereognosis: Impaired Detail Stereognosis Impaired Details: Impaired LLE;Impaired LUE Additional Comments: Improved sensation since eval Coordination Gross Motor Movements are Fluid and Coordinated: No Fine Motor Movements are Fluid and Coordinated: No Coordination and Movement Description: Decreased proprioception and smoothness of movement with L hemibody Finger Nose Finger Test: L dysmetria, R UE strength greatly improved Motor  Motor Motor: Hemiplegia;Ataxia;Motor apraxia;Abnormal postural alignment and control Motor - Discharge Observations: Improved from eval Mobility  Bed Mobility Bed Mobility: Sit to Supine;Supine to Sit Supine to Sit: Supervision/Verbal cueing Sit to Supine: Supervision/Verbal cueing Transfers Sit to Stand: Supervision/Verbal cueing Stand to Sit: Supervision/Verbal cueing  Trunk/Postural Assessment  Cervical Assessment Cervical Assessment: Within Functional Limits Thoracic Assessment Thoracic Assessment: Within Functional Limits Lumbar Assessment Lumbar Assessment:  (posterior pelvic tilt)  Balance Balance Balance Assessed: Yes Static Sitting Balance Static Sitting - Balance Support: Feet supported;Bilateral upper extremity supported Static Sitting - Level of Assistance: 5: Stand by assistance Dynamic Sitting  Balance Dynamic Sitting - Balance Support: Bilateral upper extremity supported;Feet supported Dynamic Sitting - Level of Assistance: 5: Stand by assistance Static Standing Balance Static Standing - Balance Support: During functional activity Static Standing - Level of Assistance: 4: Min assist Dynamic Standing Balance Dynamic Standing - Balance Support: Bilateral upper extremity supported;During functional activity Dynamic Standing - Level of Assistance: 3: Mod assist Extremity/Trunk Assessment RUE Assessment RUE Assessment: Exceptions to [facility] General Strength Comments: R UE 3+/5 overall, ableto lift overhead LUE Assessment LUE Assessment: Exceptions to [place]  Strength Comments: 4-/5,still ataxic but greatly improved from eval   Daneen Schick Claiborne Stroble 09/10/2021, 10:03 AM

## 2021-09-10 NOTE — Plan of Care (Signed)
Problem: RH Balance Goal: LTG: Patient will maintain dynamic sitting balance (OT) Description: LTG:  Patient will maintain dynamic sitting balance with assistance during activities of daily living (OT) Outcome: Completed/Met Goal: LTG Patient will maintain dynamic standing with ADLs (OT) Description: LTG:  Patient will maintain dynamic standing balance with assist during activities of daily living (OT)  Outcome: Completed/Met   Problem: Sit to Stand Goal: LTG:  Patient will perform sit to stand in prep for activites of daily living with assistance level (OT) Description: LTG:  Patient will perform sit to stand in prep for activites of daily living with assistance level (OT) Outcome: Completed/Met   Problem: RH Eating Goal: LTG Patient will perform eating w/assist, cues/equip (OT) Description: LTG: Patient will perform eating with assist, with/without cues using equipment (OT) Outcome: Completed/Met   Problem: RH Grooming Goal: LTG Patient will perform grooming w/assist,cues/equip (OT) Description: LTG: Patient will perform grooming with assist, with/without cues using equipment (OT) Outcome: Completed/Met   Problem: RH Bathing Goal: LTG Patient will bathe all body parts with assist levels (OT) Description: LTG: Patient will bathe all body parts with assist levels (OT) Outcome: Completed/Met   Problem: RH Dressing Goal: LTG Patient will perform upper body dressing (OT) Description: LTG Patient will perform upper body dressing with assist, with/without cues (OT). Outcome: Completed/Met Goal: LTG Patient will perform lower body dressing w/assist (OT) Description: LTG: Patient will perform lower body dressing with assist, with/without cues in positioning using equipment (OT) Outcome: Completed/Met   Problem: RH Toileting Goal: LTG Patient will perform toileting task (3/3 steps) with assistance level (OT) Description: LTG: Patient will perform toileting task (3/3 steps) with  assistance level (OT)  Outcome: Not Met (add Reason)   Problem: RH Functional Use of Upper Extremity Goal: LTG Patient will use RT/LT upper extremity as a (OT) Description: LTG: Patient will use right/left upper extremity as a stabilizer/gross assist/diminished/nondominant/dominant level with assist, with/without cues during functional activity (OT) Outcome: Completed/Met   Problem: RH Toilet Transfers Goal: LTG Patient will perform toilet transfers w/assist (OT) Description: LTG: Patient will perform toilet transfers with assist, with/without cues using equipment (OT) 09/10/2021 1001 by Adem Costlow, Daneen Schick, OT Outcome: Completed/Met 09/10/2021 0025 by Valma Cava, OT Outcome: Not Met (add Reason) Note: Patient still requires max A for toileting tasks   Problem: RH Tub/Shower Transfers Goal: LTG Patient will perform tub/shower transfers w/assist (OT) Description: LTG: Patient will perform tub/shower transfers with assist, with/without cues using equipment (OT) Outcome: Completed/Met   Problem: RH Attention Goal: LTG Patient will demonstrate this level of attention during functional activites (OT) Description: LTG:  Patient will demonstrate this level of attention during functional activites  (OT) Outcome: Completed/Met

## 2021-09-16 ENCOUNTER — Telehealth: Payer: Self-pay

## 2021-09-17 NOTE — Telephone Encounter (Signed)
09/16/2021- SW received updates from therapy team related to concerns family has with regard to pt not havign any therapies and depression. SW informed therapy team pt can be set up for outpatient but will be self-pay.   *SW spoke with pt sister [name] to discuss above. SW explained self-pay process with outpatient therapies, and encouraged her to ask about financial assistance at time of appointment. SW also discussed pt depression. She confirms pt is depressed and has been "talking crazy." SW informed will explore therapy options that may not cost.   09/17/2021- SW sent email to [facility] explaining pt medical condition and waiting on if they feel he is appropriate for their community based 8wk therapy program. SW also waiting on clarity with [facility] program for therapy services.   SW faxed outpatient PT/OT/SLP referral to [facility] (p:[phone]/f:[phone]).

## 2021-09-21 ENCOUNTER — Emergency Department (HOSPITAL_COMMUNITY): Payer: Medicaid Other

## 2021-09-21 ENCOUNTER — Encounter (HOSPITAL_COMMUNITY): Payer: Self-pay

## 2021-09-21 ENCOUNTER — Observation Stay (HOSPITAL_COMMUNITY)
Admission: EM | Admit: 2021-09-21 | Discharge: 2021-09-22 | DRG: 064 | Disposition: A | Discharge status: Home Health | Payer: Medicaid Other | Attending: Neurology | Admitting: Neurology

## 2021-09-21 ENCOUNTER — Other Ambulatory Visit: Payer: Self-pay

## 2021-09-21 DIAGNOSIS — Z8673 Personal history of transient ischemic attack (TIA), and cerebral infarction without residual deficits: Secondary | ICD-10-CM | POA: Diagnosis not present

## 2021-09-21 DIAGNOSIS — E785 Hyperlipidemia, unspecified: Secondary | ICD-10-CM | POA: Diagnosis not present

## 2021-09-21 DIAGNOSIS — Z20822 Contact with and (suspected) exposure to covid-19: Secondary | ICD-10-CM | POA: Diagnosis present

## 2021-09-21 DIAGNOSIS — R471 Dysarthria and anarthria: Secondary | ICD-10-CM | POA: Diagnosis present

## 2021-09-21 DIAGNOSIS — Z79899 Other long term (current) drug therapy: Secondary | ICD-10-CM

## 2021-09-21 DIAGNOSIS — F1210 Cannabis abuse, uncomplicated: Secondary | ICD-10-CM | POA: Diagnosis not present

## 2021-09-21 DIAGNOSIS — Z7982 Long term (current) use of aspirin: Secondary | ICD-10-CM

## 2021-09-21 DIAGNOSIS — T45525A Adverse effect of antithrombotic drugs, initial encounter: Secondary | ICD-10-CM | POA: Diagnosis not present

## 2021-09-21 DIAGNOSIS — I7774 Dissection of vertebral artery: Secondary | ICD-10-CM | POA: Diagnosis not present

## 2021-09-21 DIAGNOSIS — Z7902 Long term (current) use of antithrombotics/antiplatelets: Secondary | ICD-10-CM | POA: Diagnosis not present

## 2021-09-21 DIAGNOSIS — F32A Depression, unspecified: Secondary | ICD-10-CM | POA: Diagnosis present

## 2021-09-21 DIAGNOSIS — T45615A Adverse effect of thrombolytic drugs, initial encounter: Secondary | ICD-10-CM | POA: Diagnosis present

## 2021-09-21 DIAGNOSIS — I10 Essential (primary) hypertension: Secondary | ICD-10-CM | POA: Diagnosis present

## 2021-09-21 DIAGNOSIS — E669 Obesity, unspecified: Secondary | ICD-10-CM | POA: Diagnosis not present

## 2021-09-21 DIAGNOSIS — I619 Nontraumatic intracerebral hemorrhage, unspecified: Secondary | ICD-10-CM | POA: Diagnosis present

## 2021-09-21 DIAGNOSIS — Y929 Unspecified place or not applicable: Secondary | ICD-10-CM

## 2021-09-21 DIAGNOSIS — R233 Spontaneous ecchymoses: Secondary | ICD-10-CM

## 2021-09-21 DIAGNOSIS — I611 Nontraumatic intracerebral hemorrhage in hemisphere, cortical: Principal | ICD-10-CM | POA: Diagnosis present

## 2021-09-21 DIAGNOSIS — Z6836 Body mass index (BMI) 36.0-36.9, adult: Secondary | ICD-10-CM | POA: Diagnosis not present

## 2021-09-21 HISTORY — DX: Cerebral infarction, unspecified: I63.9

## 2021-09-21 LAB — CBC WITH DIFFERENTIAL/PLATELET
Abs Immature Granulocytes: 0.03 10*3/uL (ref 0.00–0.07)
Basophils Absolute: 0.1 10*3/uL (ref 0.0–0.1)
Basophils Relative: 1 %
Eosinophils Absolute: 0.2 10*3/uL (ref 0.0–0.5)
Eosinophils Relative: 2 %
HCT: 37.6 % — ABNORMAL LOW (ref 39.0–52.0)
Hemoglobin: 12.7 g/dL — ABNORMAL LOW (ref 13.0–17.0)
Immature Granulocytes: 0 %
Lymphocytes Relative: 15 %
Lymphs Abs: 1.6 10*3/uL (ref 0.7–4.0)
MCH: 30.6 pg (ref 26.0–34.0)
MCHC: 33.8 g/dL (ref 30.0–36.0)
MCV: 90.6 fL (ref 80.0–100.0)
Monocytes Absolute: 1 10*3/uL (ref 0.1–1.0)
Monocytes Relative: 9 %
Neutro Abs: 7.6 10*3/uL (ref 1.7–7.7)
Neutrophils Relative %: 73 %
Platelets: 403 10*3/uL — ABNORMAL HIGH (ref 150–400)
RBC: 4.15 MIL/uL — ABNORMAL LOW (ref 4.22–5.81)
RDW: 13 % (ref 11.5–15.5)
WBC: 10.5 10*3/uL (ref 4.0–10.5)
nRBC: 0 % (ref 0.0–0.2)

## 2021-09-21 LAB — COMPREHENSIVE METABOLIC PANEL
ALT: 26 U/L (ref 0–44)
AST: 22 U/L (ref 15–41)
Albumin: 3.5 g/dL (ref 3.5–5.0)
Alkaline Phosphatase: 98 U/L (ref 38–126)
Anion gap: 8 (ref 5–15)
BUN: 7 mg/dL (ref 6–20)
CO2: 25 mmol/L (ref 22–32)
Calcium: 9 mg/dL (ref 8.9–10.3)
Chloride: 106 mmol/L (ref 98–111)
Creatinine, Ser: 0.98 mg/dL (ref 0.61–1.24)
GFR, Estimated: >60 mL/min (ref >60)
Glucose, Bld: 90 mg/dL (ref 70–99)
Potassium: 3.3 mmol/L — ABNORMAL LOW (ref 3.5–5.1)
Sodium: 139 mmol/L (ref 135–145)
Total Bilirubin: 0.6 mg/dL (ref 0.3–1.2)
Total Protein: 6.9 g/dL (ref 6.5–8.1)

## 2021-09-21 IMAGING — MR MR HEAD W/O CM
6 of 10 series · 27 of 48 positions shown · non-contrast
Comparison: MRI from [DATE].
COMPARISON: MRI from [DATE].

Addendum:
CLINICAL DATA: Initial evaluation for acute dizziness.

EXAM:
MRI HEAD WITHOUT CONTRAST
TECHNIQUE: Multiplanar, multiecho pulse sequences of the brain and surrounding
structures were obtained without intravenous contrast.

[Series 2: DWI · axial · 3.0mm · 0.94mm/px · z∈[-83,+63]mm · 8 of 100 slices shown (1 of 2)]
[im 1/100]
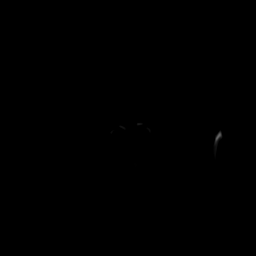
[im 12/100]
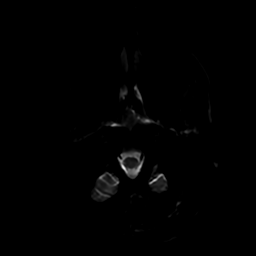
[im 34/100]
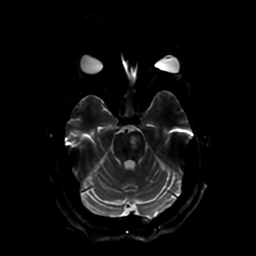
[im 45/100]
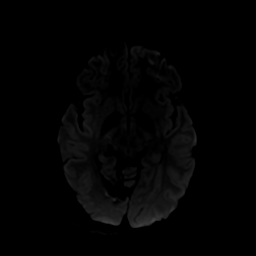
[im 56/100]
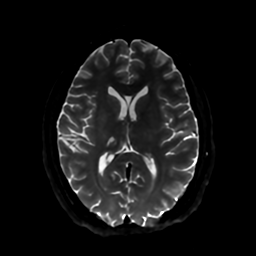
[im 67/100]
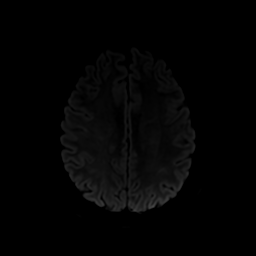
[im 89/100]
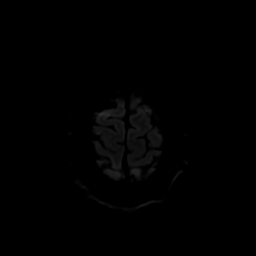
[im 100/100]
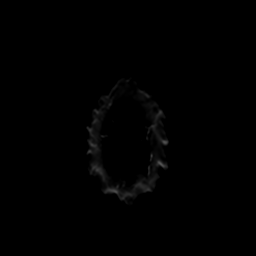

[Series 3: DWI · coronal · 4.0mm · 0.94mm/px · 7 of 78 slices shown (2 of 2)]
[im 1/78]
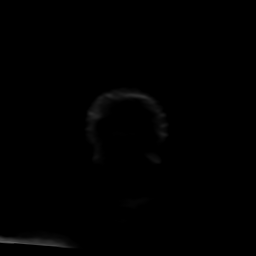
[im 13/78]
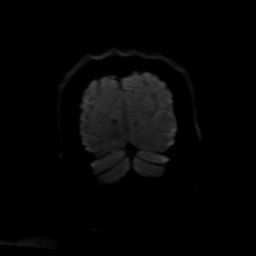
[im 26/78]
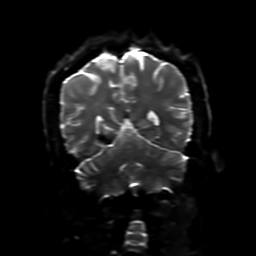
[im 39/78]
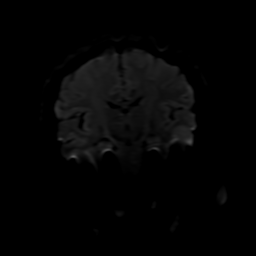
[im 52/78]
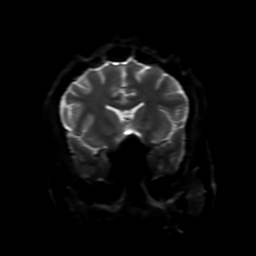
[im 65/78]
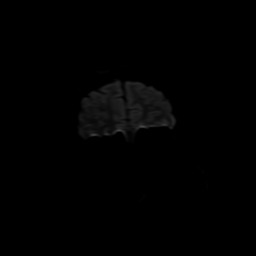
[im 78/78]
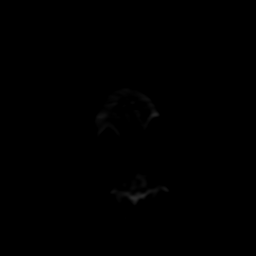

[Series 4: FLAIR · sagittal · 5.0mm · 0.23mm/px · 2 of 26 slices shown (1 of 2)]
[im 1/26]
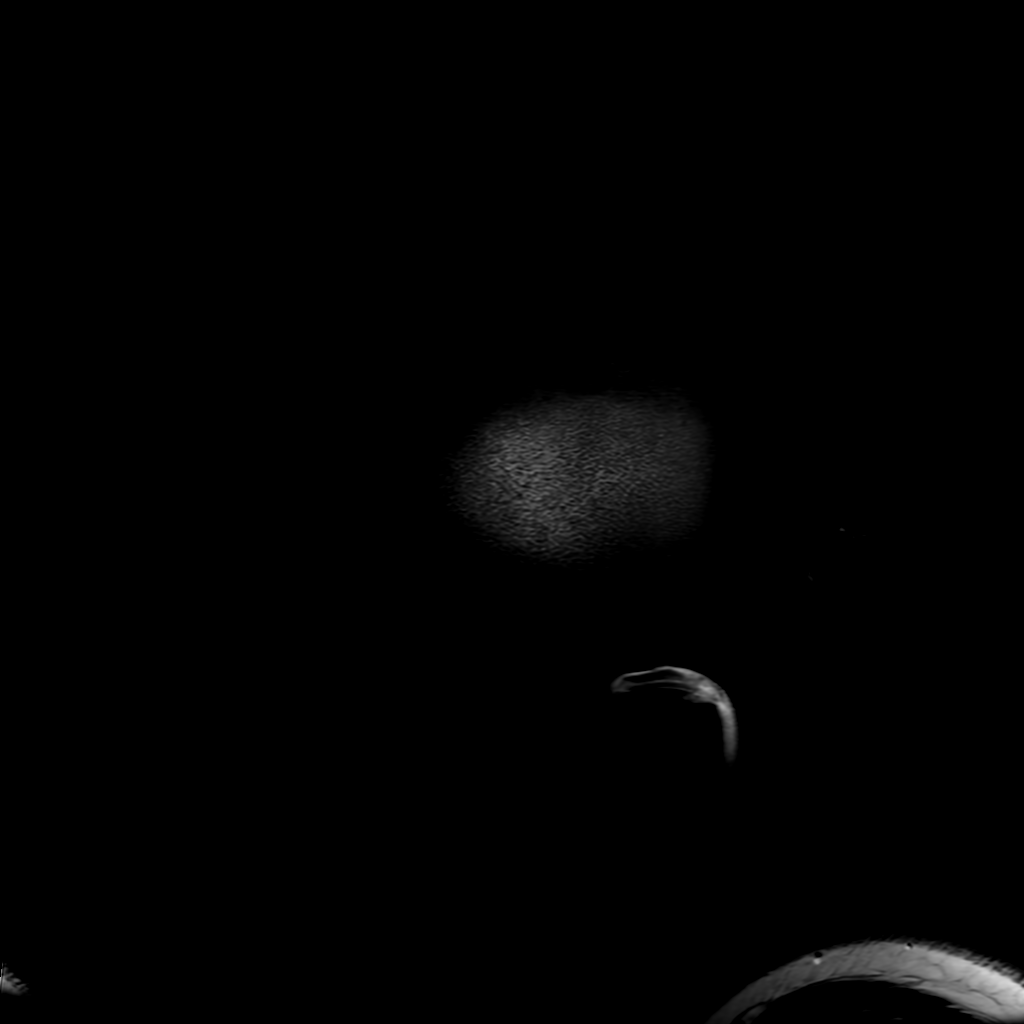
[im 26/26]
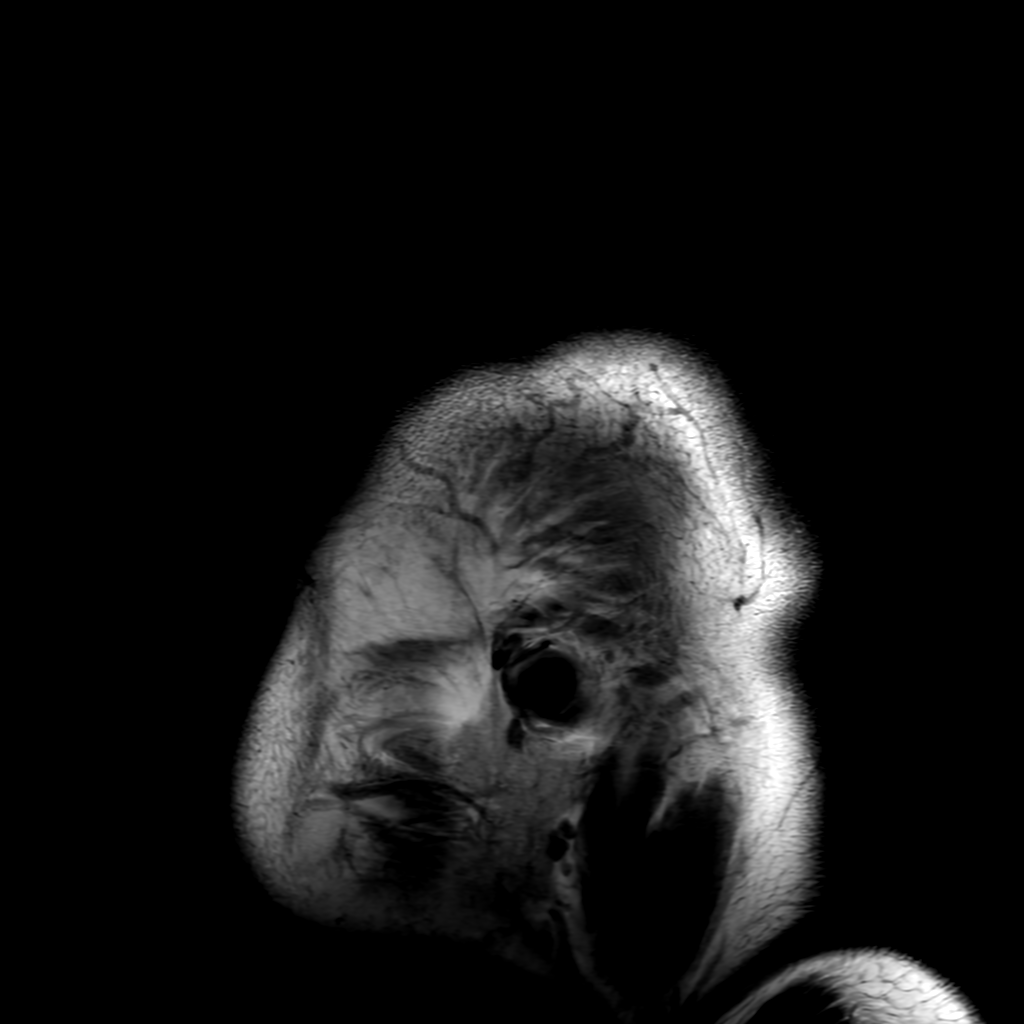

[Series 6: FLAIR · axial · 4.0mm · 0.45mm/px · z∈[-85,+63]mm · 3 of 35 slices shown (2 of 2)]
[im 1/35]
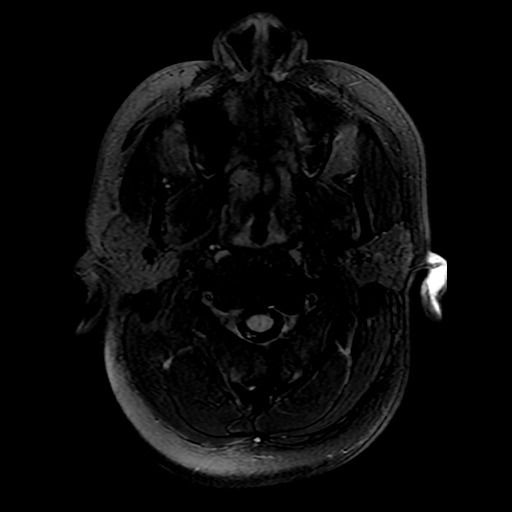
[im 18/35]
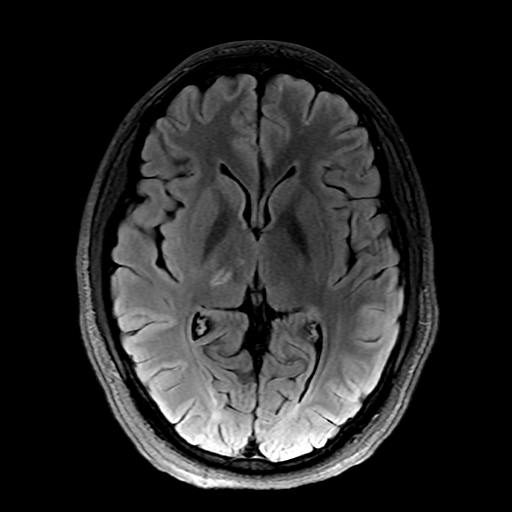
[im 35/35]
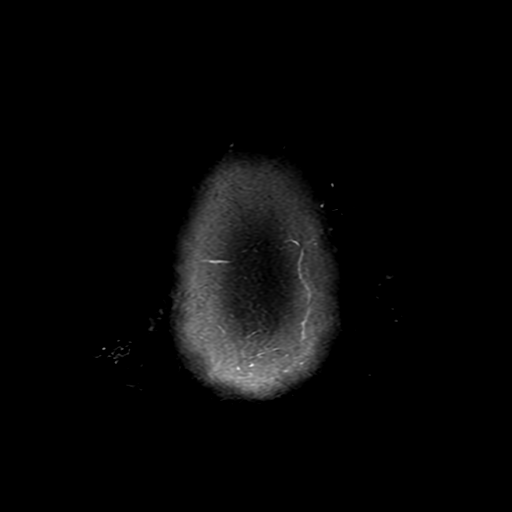

[Series 250: ADC · axial · 3.0mm · 0.94mm/px · z∈[-83,+63]mm · 4 of 50 slices shown (1 of 2)]
[im 1/50]
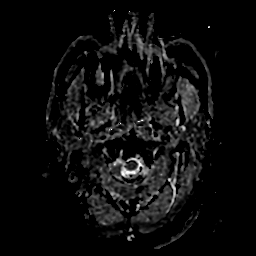
[im 17/50]
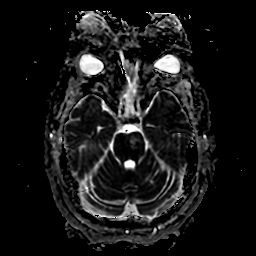
[im 33/50]
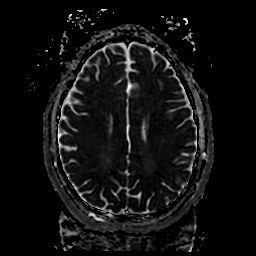
[im 50/50]
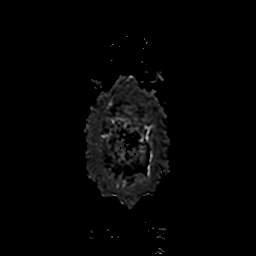

[Series 350: ADC · coronal · 4.0mm · 0.94mm/px · 3 of 35 slices shown (2 of 2)]
[im 1/35]
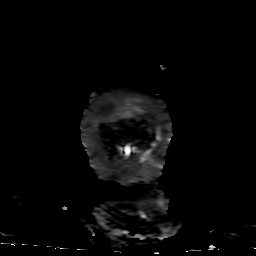
[im 18/35]
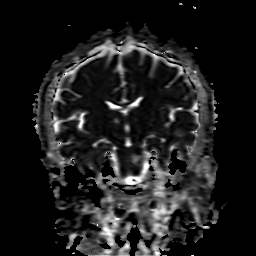
[im 35/35]
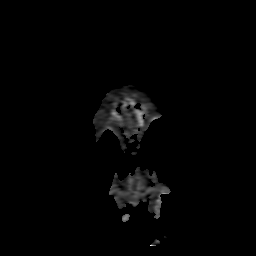

[27 of 48 positions shown; findings below may reference images not displayed]

FINDINGS: Brain: There has been normal expected interval evolution of
previously identified multiple scattered posterior circulation
infarcts. Mild residual diffusion signal abnormality present at the
midbrain and pons. Associated scattered chronic hemosiderin staining
present about several of these infarcts. No significant mass effect.

No other evidence for new or interval ischemia. Gray-white matter
differentiation maintained. No acute intracranial hemorrhage.

No mass lesion, midline shift or mass effect. No hydrocephalus or
extra-axial fluid collection. Pituitary gland suprasellar region
normal. Midline structures intact.

Vascular: Major intracranial vascular flow voids are maintained.

Skull and upper cervical spine: Craniocervical junction within
normal limits. Bone marrow signal intensity normal. No scalp soft
tissue abnormality.

Sinuses/Orbits: Globes and orbital soft tissues within normal
limits. Paranasal sinuses are largely clear. No mastoid effusion.

Other: None.
IMPRESSION: 1. Normal expected interval evolution of previously identified
scattered posterior circulation infarcts.
2. No other new acute or interval intracranial abnormality.

ADDENDUM:
Upon further review, associated susceptibility artifact at the level
of the evolving right occipital infarct appears to reflect a small
focus of acute to subacute hemorrhage, better seen on corresponding
CTA performed on the same day. While some petechial hemorrhage with
seen at this location on previous MRI from [DATE], the
parenchymal hemorrhage seen on today's exam appears to be new. No
significant mass effect.

*** End of Addendum ***
FINDINGS: Brain: There has been normal expected interval evolution of
previously identified multiple scattered posterior circulation
infarcts. Mild residual diffusion signal abnormality present at the
midbrain and pons. Associated scattered chronic hemosiderin staining
present about several of these infarcts. No significant mass effect.

No other evidence for new or interval ischemia. Gray-white matter
differentiation maintained. No acute intracranial hemorrhage.

No mass lesion, midline shift or mass effect. No hydrocephalus or
extra-axial fluid collection. Pituitary gland suprasellar region
normal. Midline structures intact.

Vascular: Major intracranial vascular flow voids are maintained.

Skull and upper cervical spine: Craniocervical junction within
normal limits. Bone marrow signal intensity normal. No scalp soft
tissue abnormality.

Sinuses/Orbits: Globes and orbital soft tissues within normal
limits. Paranasal sinuses are largely clear. No mastoid effusion.

Other: None.
IMPRESSION: 1. Normal expected interval evolution of previously identified
scattered posterior circulation infarcts.
2. No other new acute or interval intracranial abnormality.

## 2021-09-21 NOTE — ED Provider Notes (Signed)
[facility] EMERGENCY DEPARTMENT Provider Note   CSN: [id number] Arrival date & time: 09/21/21  2200     History  Chief Complaint  Patient presents with   Dizziness    Kingjames Coury is a 26 y.o. male with PMH vertebral dissection, posterior circulation stroke, HLD, obesity who presents to the emergency department for evaluation of dizziness.  Patient states that this is how he felt during his last stroke.  He states that he had an acute episode that lasted approximately 60 seconds of vertigo but the symptoms have since resolved.  He currently denies chest pain, shortness of breath, Donnell pain, nausea, vomiting, numbness, tingling, weakness or any neurologic or systemic complaints.   Dizziness     Home Medications Prior to Admission medications   Medication Sig Start Date End Date Taking? Authorizing Provider  acetaminophen (TYLENOL) 325 MG tablet Take 2 tablets (650 mg total) by mouth every 4 (four) hours as needed for mild pain (or temp > 37.5 C (99.5 F)). 09/09/21   Angiulli, Mcarthur Rossetti, PA-C  aspirin 81 MG chewable tablet Chew 1 tablet (81 mg total) by mouth daily. 08/06/21   Suzan Garibaldi, PA-C  atorvastatin (LIPITOR) 40 MG tablet Take 1 tablet (40 mg total) by mouth at bedtime. 09/09/21   Angiulli, Mcarthur Rossetti, PA-C  folic acid (FOLVITE) 1 MG tablet Take 1 tablet (1 mg total) by mouth daily. 09/09/21   Angiulli, Mcarthur Rossetti, PA-C  melatonin 5 MG TABS Take 1 tablet (5 mg total) by mouth at bedtime. 09/09/21   Angiulli, Mcarthur Rossetti, PA-C  Multiple Vitamin (MULTIVITAMIN WITH MINERALS) TABS tablet Take 1 tablet by mouth daily. 08/06/21   Suzan Garibaldi, PA-C  polyethylene glycol (MIRALAX / GLYCOLAX) 17 g packet Take 17 g by mouth 2 (two) times daily. 09/09/21   Angiulli, Mcarthur Rossetti, PA-C  QUEtiapine (SEROQUEL) 25 MG tablet Take 1 tablet (25 mg total) by mouth at bedtime as needed (insomnia). 09/09/21   Angiulli, Mcarthur Rossetti, PA-C  sertraline (ZOLOFT) 100 MG tablet Take 1  tablet (100 mg total) by mouth daily. 09/09/21   Angiulli, Mcarthur Rossetti, PA-C  ticagrelor (BRILINTA) 90 MG TABS tablet Take 1 tablet (90 mg total) by mouth 2 (two) times daily. 09/09/21   Angiulli, Mcarthur Rossetti, PA-C      Allergies    Patient has no known allergies.    Review of Systems   Review of Systems  Neurological:  Positive for dizziness.   Physical Exam Updated Vital Signs BP 113/64    Pulse 77    Temp 98.4 F (36.9 C) (Oral)    Resp 19    Ht 5\' 2"  (1.575 m)    Wt 98 kg    SpO2 100%    BMI 39.52 kg/m  Physical Exam Vitals and nursing note reviewed.  Constitutional:      General: He is not in acute distress.    Appearance: He is well-developed.  HENT:     Head: Normocephalic and atraumatic.  Eyes:     Conjunctiva/sclera: Conjunctivae normal.  Cardiovascular:     Rate and Rhythm: Normal rate and regular rhythm.     Heart sounds: No murmur heard. Pulmonary:     Effort: Pulmonary effort is normal. No respiratory distress.     Breath sounds: Normal breath sounds.  Abdominal:     Palpations: Abdomen is soft.     Tenderness: There is no abdominal tenderness.  Musculoskeletal:        General: No  swelling.     Cervical back: Neck supple.  Skin:    General: Skin is warm and dry.     Capillary Refill: Capillary refill takes less than 2 seconds.  Neurological:     Mental Status: He is alert. Mental status is at baseline.     Cranial Nerves: No cranial nerve deficit.     Sensory: No sensory deficit.     Motor: No weakness.  Psychiatric:        Mood and Affect: Mood normal.    ED Results / Procedures / Treatments   Labs (all labs ordered are listed, but only abnormal results are displayed) Labs Reviewed  COMPREHENSIVE METABOLIC PANEL - Abnormal; Notable for the following components:      Result Value   Potassium 3.3 (*)    All other components within normal limits  CBC WITH DIFFERENTIAL/PLATELET - Abnormal; Notable for the following components:   RBC 4.15 (*)     Hemoglobin 12.7 (*)    HCT 37.6 (*)    Platelets 403 (*)    All other components within normal limits    EKG None  Radiology No results found.  Procedures Procedures  I reviewed the cardiac monitor.  Normal sinus rhythm, normal rate with no arrhythmia.  Medications Ordered in ED Medications - No data to display  ED Course/ Medical Decision Making/ A&P                           Medical Decision Making  Patient seen emergency department for evaluation of vertigo.  Physical exam unremarkable outside of patient's known baseline.  No new nystagmus or cranial nerve deficits.  Laboratory evaluation with a hypokalemia 3.3, hemoglobin 12.7 but is otherwise unremarkable.  Neurology was consulted and I spoke directly with neurology who is recommending CT angio brain and neck as well as MRI.  Patient then signed out to oncoming provider.  Please see provider signout for continuation of work-up.        Final Clinical Impression(s) / ED Diagnoses Final diagnoses:  None    Rx / DC Orders ED Discharge Orders     None         Senetra Dillin, Wyn Forster, MD 09/22/21 [id number]

## 2021-09-21 NOTE — ED Triage Notes (Signed)
Pt presents to the ED from home via [facility] with complaints of dizziness onset today with nausea and vomiting x2. Pt states he feels like the room is spinning. Pt had CVA in Nov, with right sided weakness, slurred speech. Pt denies change in medications or recent injury.

## 2021-09-21 NOTE — ED Notes (Signed)
Pt transported to imaging.

## 2021-09-22 ENCOUNTER — Emergency Department (HOSPITAL_COMMUNITY): Payer: Medicaid Other

## 2021-09-22 ENCOUNTER — Inpatient Hospital Stay (HOSPITAL_COMMUNITY): Payer: Medicaid Other

## 2021-09-22 DIAGNOSIS — I7774 Dissection of vertebral artery: Secondary | ICD-10-CM

## 2021-09-22 DIAGNOSIS — E785 Hyperlipidemia, unspecified: Secondary | ICD-10-CM

## 2021-09-22 DIAGNOSIS — I611 Nontraumatic intracerebral hemorrhage in hemisphere, cortical: Secondary | ICD-10-CM

## 2021-09-22 DIAGNOSIS — I6302 Cerebral infarction due to thrombosis of basilar artery: Secondary | ICD-10-CM | POA: Diagnosis not present

## 2021-09-22 DIAGNOSIS — R233 Spontaneous ecchymoses: Secondary | ICD-10-CM

## 2021-09-22 DIAGNOSIS — I619 Nontraumatic intracerebral hemorrhage, unspecified: Secondary | ICD-10-CM | POA: Insufficient documentation

## 2021-09-22 DIAGNOSIS — I10 Essential (primary) hypertension: Secondary | ICD-10-CM | POA: Diagnosis not present

## 2021-09-22 LAB — MRSA NEXT GEN BY PCR, NASAL: MRSA by PCR Next Gen: NOT DETECTED

## 2021-09-22 LAB — RESP PANEL BY RT-PCR (FLU A&B, COVID) ARPGX2
Influenza A by PCR: NEGATIVE
Influenza B by PCR: NEGATIVE
SARS Coronavirus 2 by RT PCR: NEGATIVE

## 2021-09-22 IMAGING — CT CT ANGIO HEAD-NECK (W OR W/O PERF)
1 of 11 series · 14 of 47 positions shown · IV contrast (omnipaque)
Comparison: MRI from [DATE].
COMPARISON: MRI from [DATE].

Addendum:
CLINICAL DATA: Initial evaluation for acute dizziness.

EXAM:
CT ANGIOGRAPHY HEAD AND NECK
TECHNIQUE: Multidetector CT imaging of the head and neck was performed using
the standard protocol during bolus administration of intravenous
contrast. Multiplanar CT image reconstructions and MIPs were
obtained to evaluate the vascular anatomy. Carotid stenosis
measurements (when applicable) are obtained utilizing NASCET
criteria, using the distal internal carotid diameter as the
denominator.
CONTRAST:  75mL OMNIPAQUE IOHEXOL 350 MG/ML SOLN

[Series 8: thin · axial · 0.58mm/px · z∈[-304,+6]mm · 14 of 716 slices shown]
[im 48/716  brain]
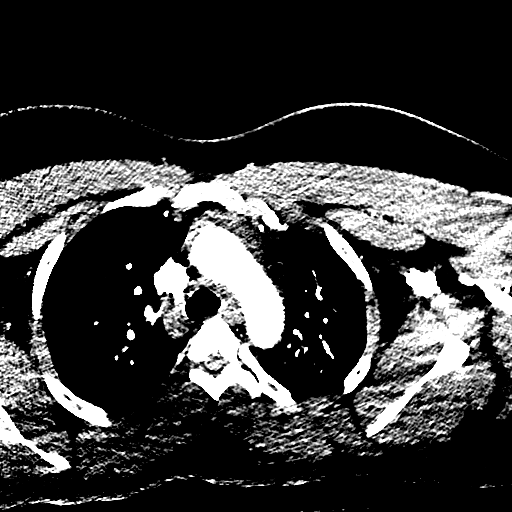
[im 96/716  bone]
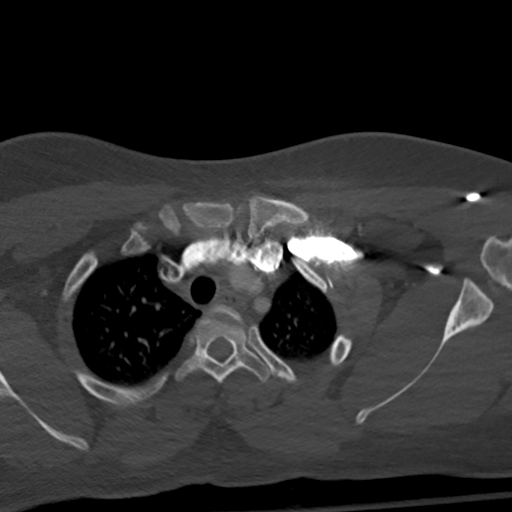
[im 144/716  brain]
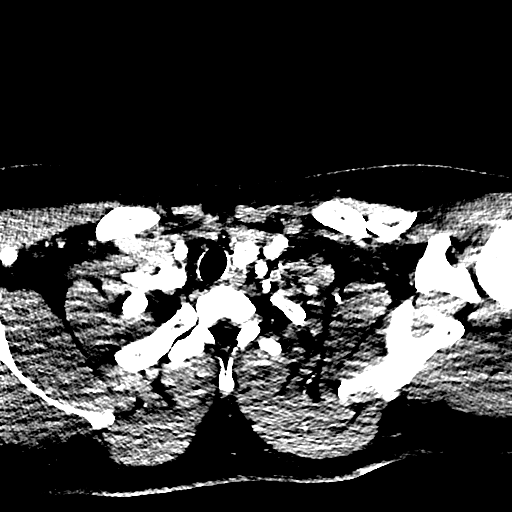
[im 191/716  bone]
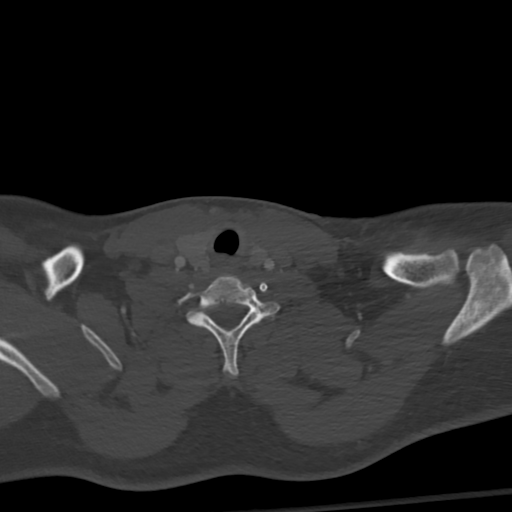
[im 239/716  brain]
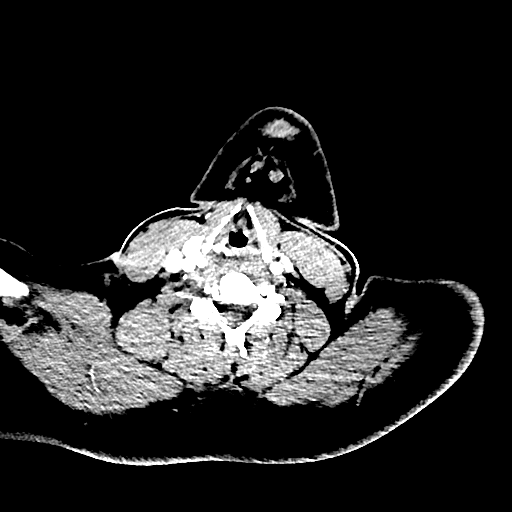
[im 287/716  bone]
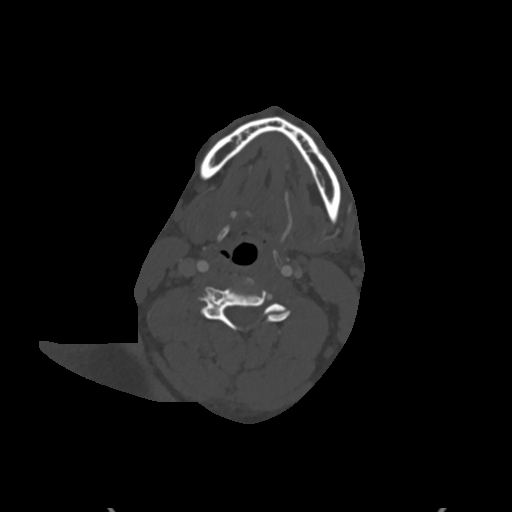
[im 334/716  brain]
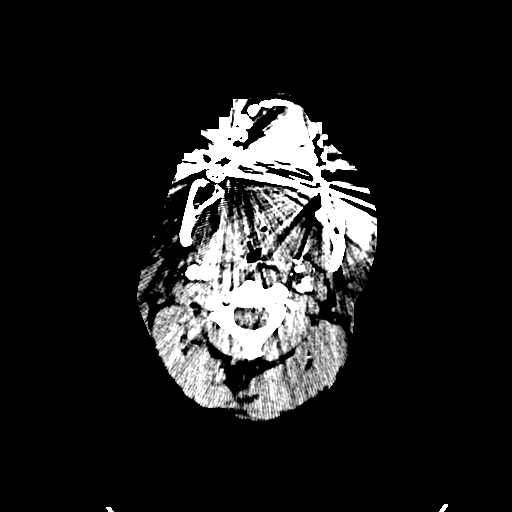
[im 382/716  bone]
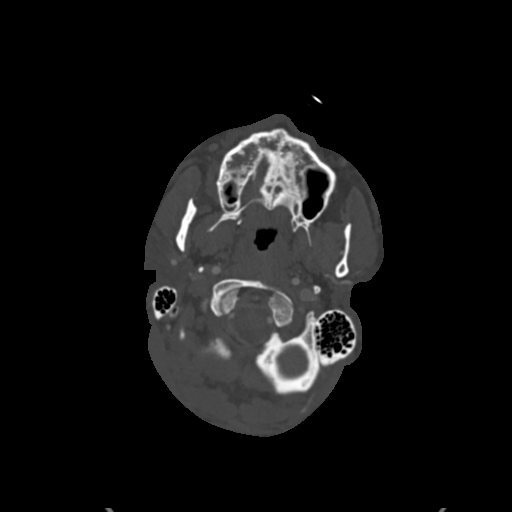
[im 430/716  brain]
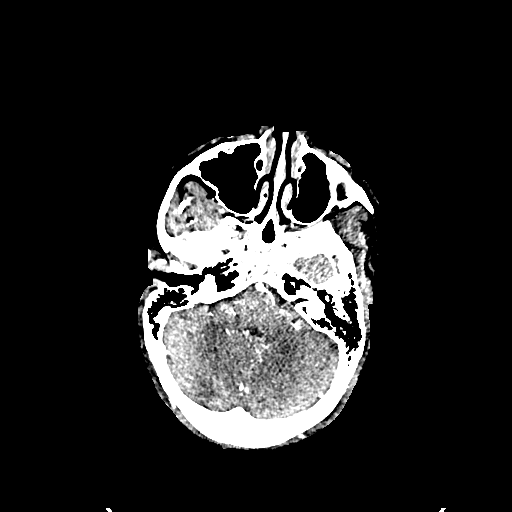
[im 477/716  bone]
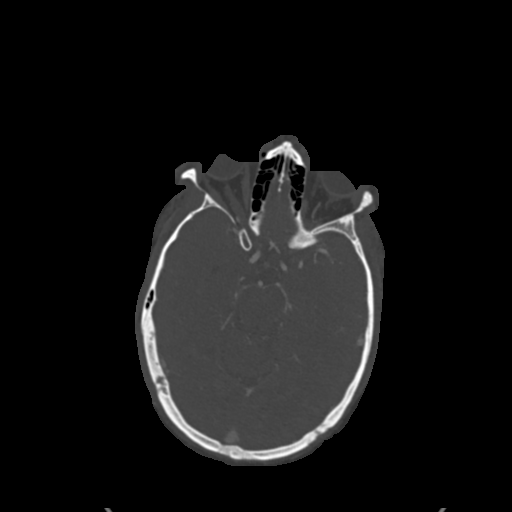
[im 525/716  brain]
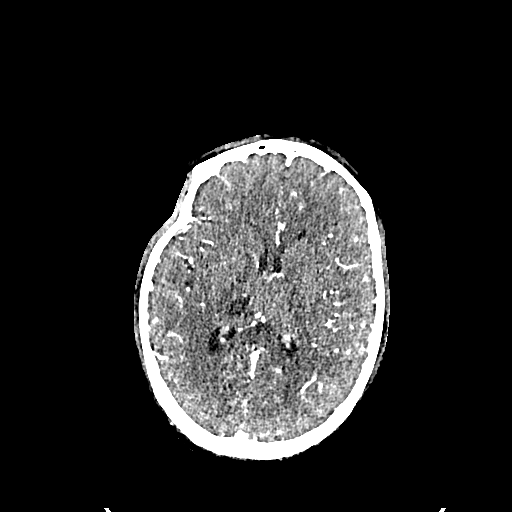
[im 573/716  bone]
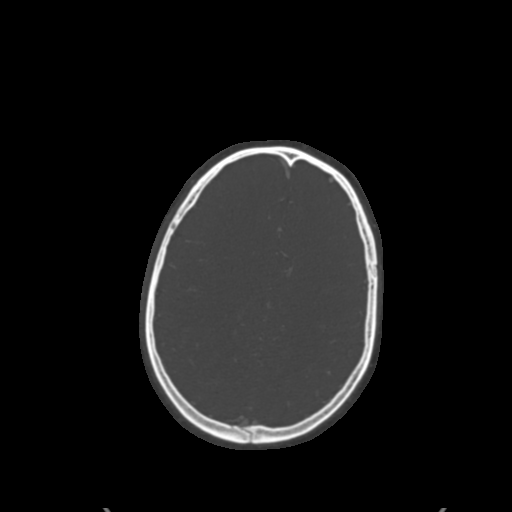
[im 620/716  brain]
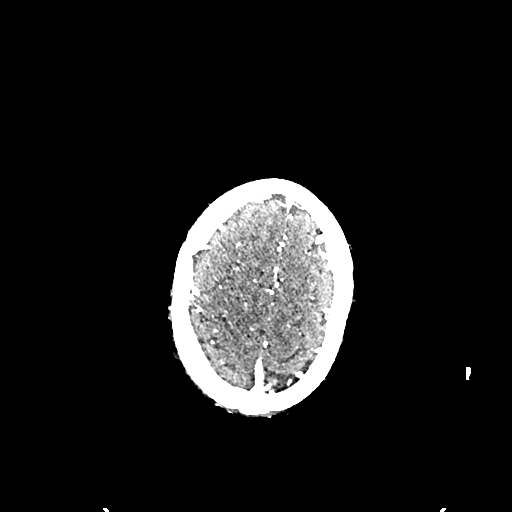
[im 668/716  bone]
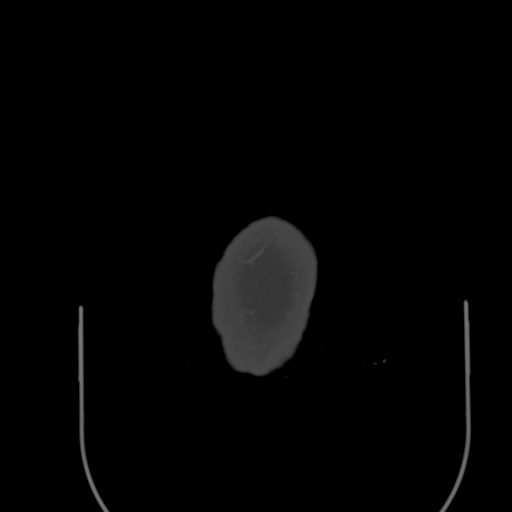

[14 of 47 positions shown; findings below may reference images not displayed]

FINDINGS: CT HEAD FINDINGS

Brain: Continued interval evolution of previously identified
posterior circulation infarcts involving the right thalamus, right
occipital lobe, brainstem, and cerebellum. Overall distribution is
similar to previous. Now seen is a small amount of hemorrhage at the
level of the right occipital lobe (series 4, image 13). Area of
measures 2.3 cm in size. Petechial blood products were present at
this location on prior MRI. No significant regional mass effect. No
other visible hemorrhage by CT.

No other acute large vessel territory infarct. No midline shift or
significant mass effect. No visible mass lesion. No hydrocephalus.
No extra-axial fluid collection.

Vascular: No hyperdense vessel.

Skull: Scalp soft tissues and calvarium within normal limits.

Sinuses: Clear.

Orbits: Unremarkable.

Review of the MIP images confirms the above findings

CTA NECK FINDINGS

Aortic arch: Visualized aortic arch normal caliber with normal
branch pattern. No stenosis about the origin of the great vessels.

Right carotid system: Right common and internal carotid arteries
widely patent without stenosis, dissection or occlusion.

Left carotid system: Left common and internal carotid arteries
widely patent without stenosis, dissection or occlusion.

Vertebral arteries: Both vertebral arteries arise from the
subclavian arteries. No proximal subclavian artery stenosis.
Interval stenting at the proximal left V1 segment at site of
previously identified dissection. Grossly patent flow through the
stent. No visible residual dissection by CT. Vertebral arteries
otherwise patent without stenosis or other new finding.

Skeleton: Unremarkable.

Other neck: No other acute soft tissue abnormality within the neck.

Upper chest: Visualized upper chest demonstrates no acute finding.

Review of the MIP images confirms the above findings

CTA HEAD FINDINGS

Anterior circulation: Both internal carotid arteries widely patent
to the termini without stenosis. A1 segments widely patent. Normal
anterior communicating artery complex. Both anterior cerebral
arteries widely patent to their distal aspects without stenosis. No
M1 stenosis or occlusion. Normal MCA bifurcations. Distal MCA
branches well perfused and symmetric.

Posterior circulation: Both vertebral arteries patent to the
vertebrobasilar junction without stenosis. Left vertebral artery
slightly dominant. Both PICA patent. Basilar patent to its distal
aspect. Superior cerebral arteries patent bilaterally. Both PCAs
primarily supplied via the basilar. PCAs now patent to their distal
aspects without stenosis or other abnormality.

Venous sinuses: Patent allowing for timing the contrast bolus.

Anatomic variants: None significant.  No aneurysm.

Review of the MIP images confirms the above findings
IMPRESSION: 1. Negative CTA for large vessel occlusion.
2. Interval stenting at the proximal left V1 segment at site of
previously identified dissection. Grossly patent flow through the
stent. No visible residual dissection by CT.
3. Continued interval evolution of previously identified posterior
circulation infarcts, with new 2.3 cm focus of hemorrhage at the
level of the right occipital lobe. No significant regional mass
effect.
4. No other new acute intracranial abnormality.

These results were communicated to Dr. JACOBO at [DATE] on
[DATE] by text page via the [employer] messaging system.

ADDENDUM:
Findings also communicated to Dr. JACOBO of the emergency
department by telephone at approximately 3 a.m. on [DATE].

*** End of Addendum ***
FINDINGS: CT HEAD FINDINGS

Brain: Continued interval evolution of previously identified
posterior circulation infarcts involving the right thalamus, right
occipital lobe, brainstem, and cerebellum. Overall distribution is
similar to previous. Now seen is a small amount of hemorrhage at the
level of the right occipital lobe (series 4, image 13). Area of
measures 2.3 cm in size. Petechial blood products were present at
this location on prior MRI. No significant regional mass effect. No
other visible hemorrhage by CT.

No other acute large vessel territory infarct. No midline shift or
significant mass effect. No visible mass lesion. No hydrocephalus.
No extra-axial fluid collection.

Vascular: No hyperdense vessel.

Skull: Scalp soft tissues and calvarium within normal limits.

Sinuses: Clear.

Orbits: Unremarkable.

Review of the MIP images confirms the above findings

CTA NECK FINDINGS

Aortic arch: Visualized aortic arch normal caliber with normal
branch pattern. No stenosis about the origin of the great vessels.

Right carotid system: Right common and internal carotid arteries
widely patent without stenosis, dissection or occlusion.

Left carotid system: Left common and internal carotid arteries
widely patent without stenosis, dissection or occlusion.

Vertebral arteries: Both vertebral arteries arise from the
subclavian arteries. No proximal subclavian artery stenosis.
Interval stenting at the proximal left V1 segment at site of
previously identified dissection. Grossly patent flow through the
stent. No visible residual dissection by CT. Vertebral arteries
otherwise patent without stenosis or other new finding.

Skeleton: Unremarkable.

Other neck: No other acute soft tissue abnormality within the neck.

Upper chest: Visualized upper chest demonstrates no acute finding.

Review of the MIP images confirms the above findings

CTA HEAD FINDINGS

Anterior circulation: Both internal carotid arteries widely patent
to the termini without stenosis. A1 segments widely patent. Normal
anterior communicating artery complex. Both anterior cerebral
arteries widely patent to their distal aspects without stenosis. No
M1 stenosis or occlusion. Normal MCA bifurcations. Distal MCA
branches well perfused and symmetric.

Posterior circulation: Both vertebral arteries patent to the
vertebrobasilar junction without stenosis. Left vertebral artery
slightly dominant. Both PICA patent. Basilar patent to its distal
aspect. Superior cerebral arteries patent bilaterally. Both PCAs
primarily supplied via the basilar. PCAs now patent to their distal
aspects without stenosis or other abnormality.

Venous sinuses: Patent allowing for timing the contrast bolus.

Anatomic variants: None significant.  No aneurysm.

Review of the MIP images confirms the above findings
IMPRESSION: 1. Negative CTA for large vessel occlusion.
2. Interval stenting at the proximal left V1 segment at site of
previously identified dissection. Grossly patent flow through the
stent. No visible residual dissection by CT.
3. Continued interval evolution of previously identified posterior
circulation infarcts, with new 2.3 cm focus of hemorrhage at the
level of the right occipital lobe. No significant regional mass
effect.
4. No other new acute intracranial abnormality.

These results were communicated to Dr. JACOBO at [DATE] on
[DATE] by text page via the [employer] messaging system.

## 2021-09-22 IMAGING — CT CT HEAD W/O CM
3 of 4 series · 14 of 47 positions shown, 16 images · non-contrast
Comparison: CTA [4W] hours today.  Brain MRI [DATE].

CLINICAL DATA: 25-year-old male with dizziness. Vertebral
dissection, basilar artery thromboembolism, posterior circulation
infarcts in [REDACTED]. Right occipital lobe petechial hemorrhage that
month with more conspicuous blood products there on MRI and CTA
earlier today.

EXAM:
CT HEAD WITHOUT CONTRAST
TECHNIQUE: Contiguous axial images were obtained from the base of the skull
through the vertex without intravenous contrast.

[Series 4: head 2.0 h70h · axial · 0.49mm/px · z∈[-138,-10]mm · 8 of 80 slices shown, 10 images]
[im 8/80  brain]
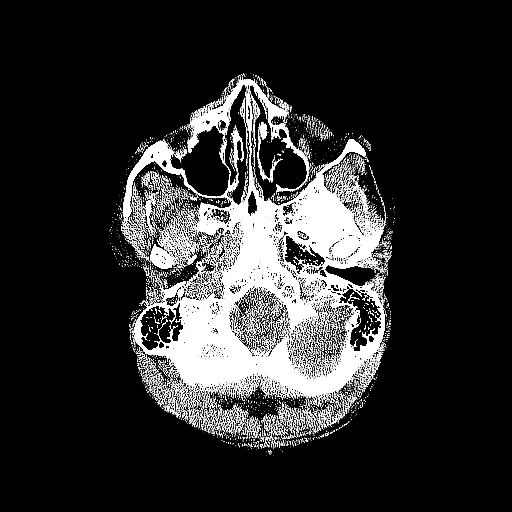
[im 8/80  bone]
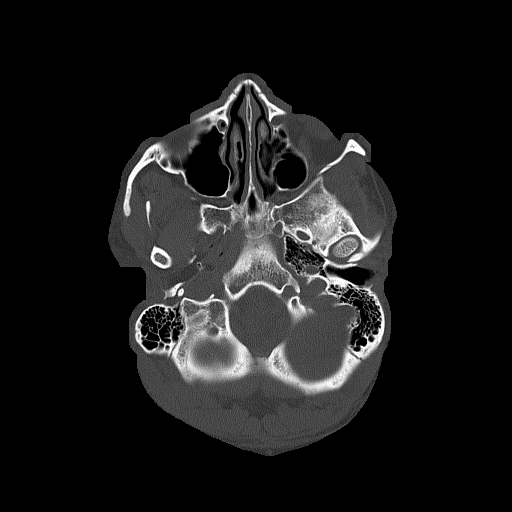
[im 16/80  brain]
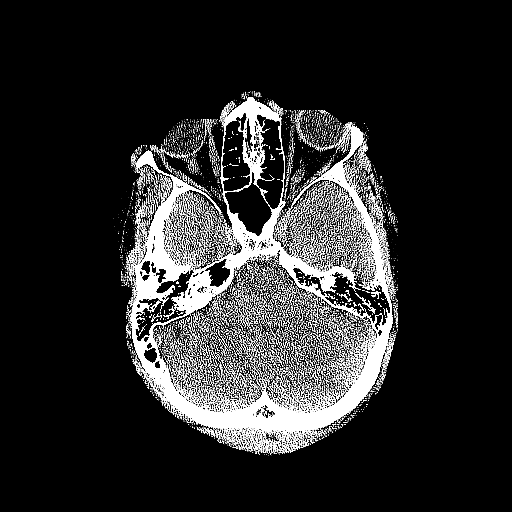
[im 24/80  brain]
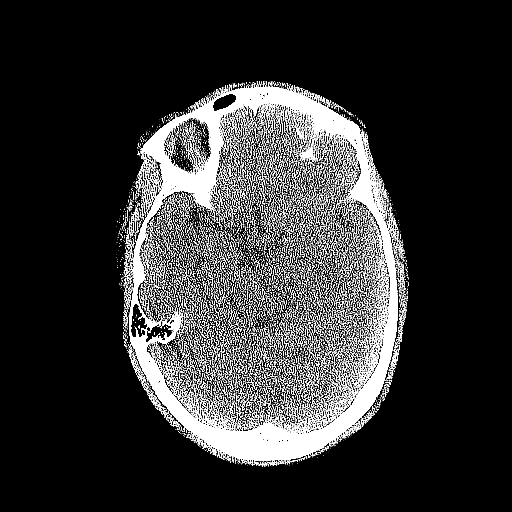
[im 36/80  brain]
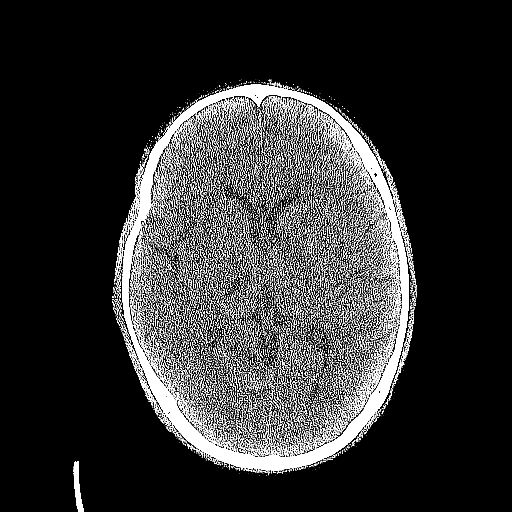
[im 44/80  brain]
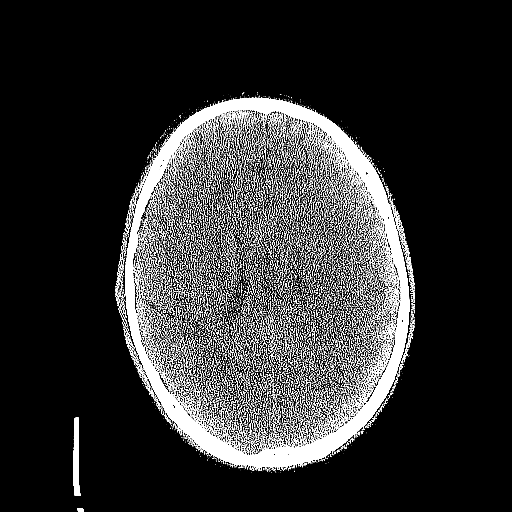
[im 44/80  bone]
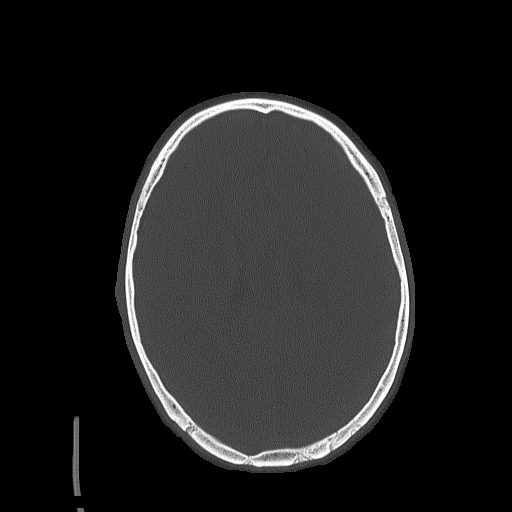
[im 56/80  brain]
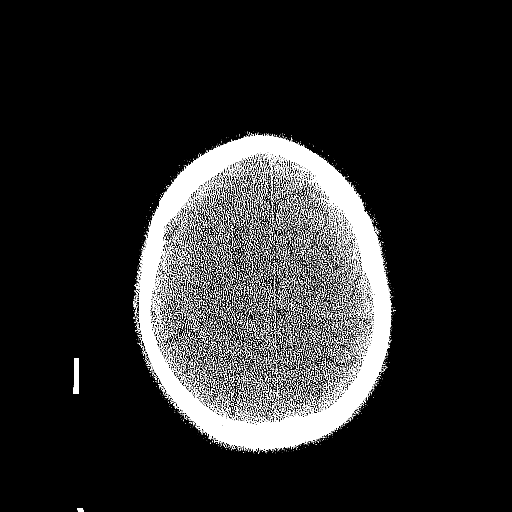
[im 64/80  brain]
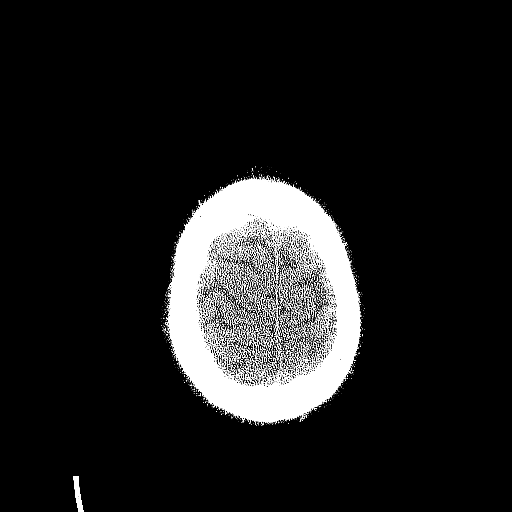
[im 72/80  brain]
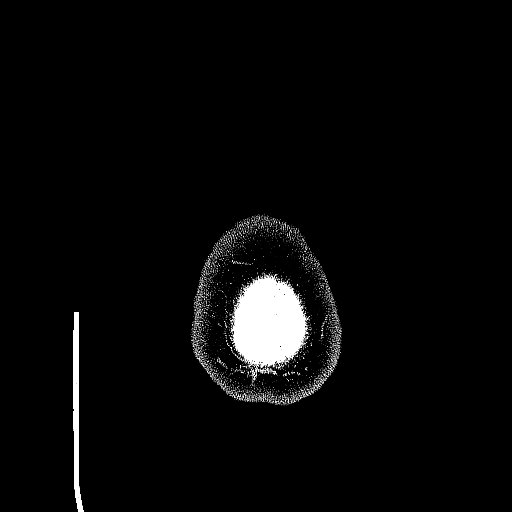

[Series 5: head 3.0 mpr cor · coronal · 0.30mm/px · 3 of 82 slices shown]
[im 28/82  brain]
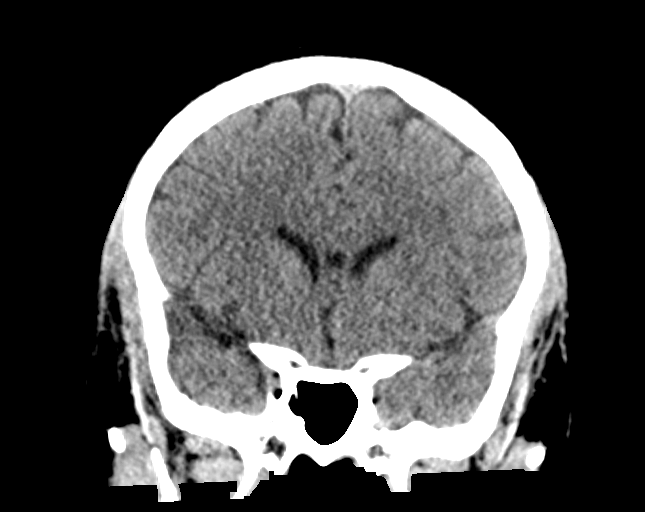
[im 37/82  brain]
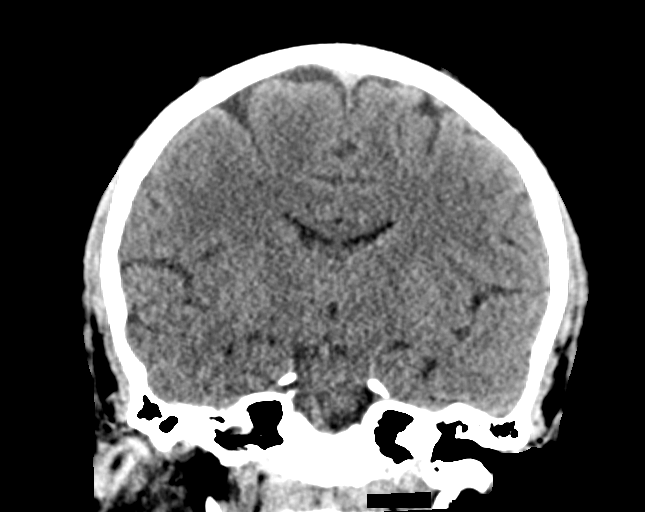
[im 46/82  brain]
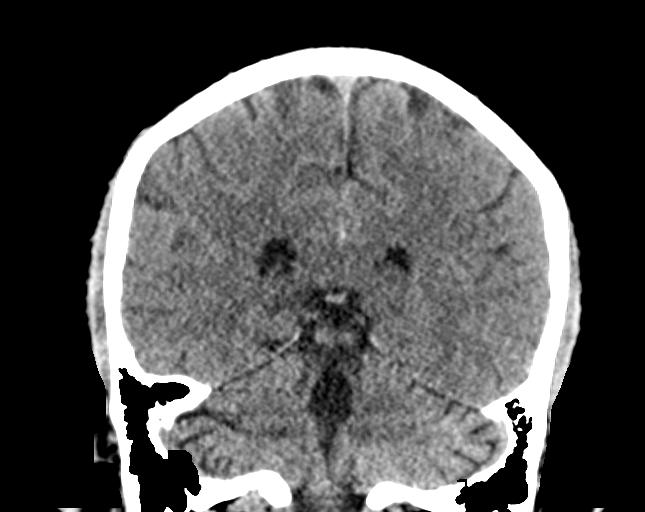

[Series 6: head 3.0 mpr sag · sagittal · 0.31mm/px · 3 of 67 slices shown]
[im 23/67  brain]
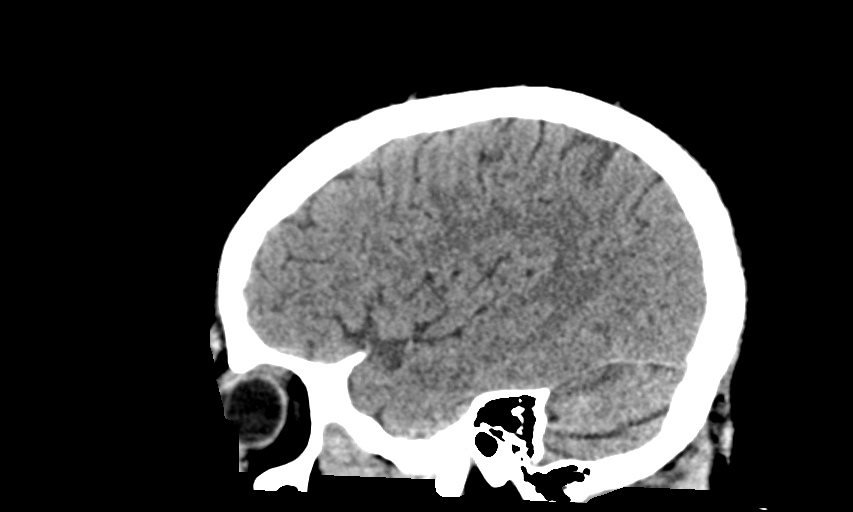
[im 34/67  brain]
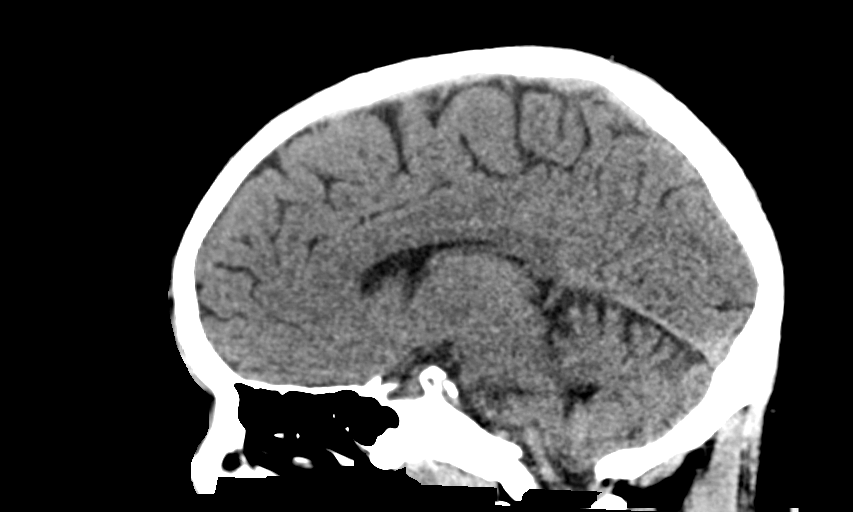
[im 45/67  brain]
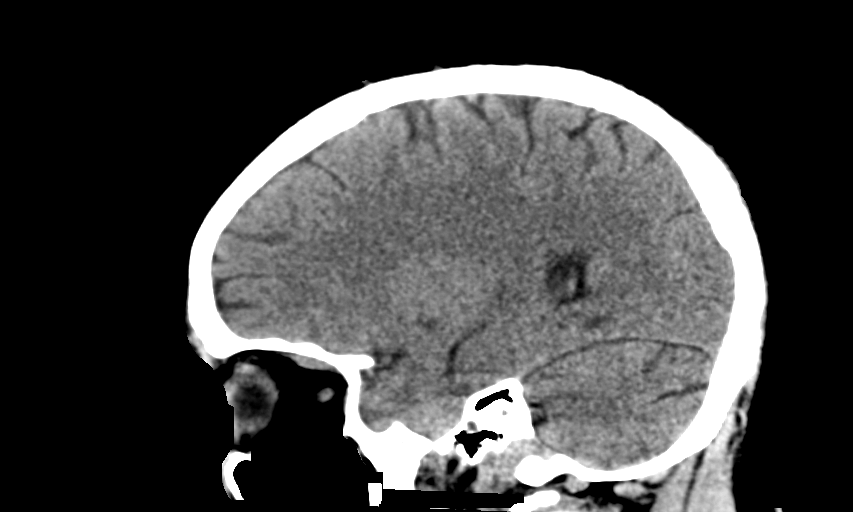

[14 of 47 positions shown; findings below may reference images not displayed]

FINDINGS: Brain: Small area of parenchymal hemorrhage in the medial right
occipital lobe (coronal image 57) appears unchanged from the CTA
earlier today, measuring up to 19 mm long axis, with no regional
mass effect. Regional developing encephalomalacia.

No other intracranial hemorrhage identified. No ventriculomegaly.
Prominent right thalamic encephalomalacia. Scattered cerebellar and
left brainstem encephalomalacia. Stable gray-white matter
differentiation throughout the brain. No cortically based acute
infarct identified. No midline shift, mass effect, or evidence of
intracranial mass lesion.

Vascular: No suspicious intracranial vascular hyperdensity.

Skull: Negative.

Sinuses/Orbits: Visualized paranasal sinuses and mastoids are stable
and well aerated.

Other: Visualized orbits and scalp soft tissues are within normal
limits.
IMPRESSION: 1. Small 1.9 cm area of right occipital lobe blood appears stable
since [4W] hours today and is most compatible with post ischemic
petechial hemorrhage.
2. Otherwise stable multifocal posterior circulation developing
encephalomalacia. No new intracranial abnormality.

## 2021-09-22 MED ORDER — FOLIC ACID 1 MG PO TABS
1.0000 mg | ORAL_TABLET | Freq: Every day | ORAL | Status: DC
  Administered 2021-09-22: 1 mg via ORAL
  Filled 2021-09-22: qty 1

## 2021-09-22 MED ORDER — SENNOSIDES-DOCUSATE SODIUM 8.6-50 MG PO TABS
1.0000 | ORAL_TABLET | Freq: Two times a day (BID) | ORAL | Status: DC
  Administered 2021-09-22: 1 via ORAL
  Filled 2021-09-22: qty 1

## 2021-09-22 MED ORDER — ADULT MULTIVITAMIN W/MINERALS CH
1.0000 | ORAL_TABLET | Freq: Every day | ORAL | Status: DC
  Administered 2021-09-22: 1 via ORAL
  Filled 2021-09-22: qty 1

## 2021-09-22 MED ORDER — PANTOPRAZOLE SODIUM 40 MG PO TBEC: 40.0000 mg | DELAYED_RELEASE_TABLET | Freq: Every day | ORAL | Status: DC

## 2021-09-22 MED ORDER — ORAL CARE MOUTH RINSE: 15.0000 mL | Freq: Two times a day (BID) | OROMUCOSAL | Status: DC

## 2021-09-22 MED ORDER — ATORVASTATIN CALCIUM 40 MG PO TABS: 40.0000 mg | ORAL_TABLET | Freq: Every day | ORAL | Status: DC

## 2021-09-22 MED ORDER — IOHEXOL 350 MG/ML SOLN
75.0000 mL | Freq: Once | INTRAVENOUS | Status: AC | PRN
  Administered 2021-09-22: 75 mL via INTRAVENOUS

## 2021-09-22 MED ORDER — PANTOPRAZOLE SODIUM 40 MG IV SOLR: 40.0000 mg | Freq: Every day | INTRAVENOUS | Status: DC

## 2021-09-22 MED ORDER — CHLORHEXIDINE GLUCONATE CLOTH 2 % EX PADS: 6.0000 | MEDICATED_PAD | Freq: Every day | CUTANEOUS | Status: DC

## 2021-09-22 MED ORDER — POLYETHYLENE GLYCOL 3350 17 G PO PACK
17.0000 g | PACK | Freq: Two times a day (BID) | ORAL | Status: DC
  Administered 2021-09-22: 17 g via ORAL
  Filled 2021-09-22: qty 1

## 2021-09-22 MED ORDER — ACETAMINOPHEN 325 MG PO TABS: 650.0000 mg | ORAL_TABLET | ORAL | Status: DC | PRN

## 2021-09-22 MED ORDER — STROKE: EARLY STAGES OF RECOVERY BOOK
Freq: Once | Status: DC
  Filled 2021-09-22 (×2): qty 1

## 2021-09-22 MED ORDER — MELATONIN 5 MG PO TABS: 5.0000 mg | ORAL_TABLET | Freq: Every day | ORAL | Status: DC

## 2021-09-22 MED ORDER — ACETAMINOPHEN 650 MG RE SUPP: 650.0000 mg | RECTAL | Status: DC | PRN

## 2021-09-22 MED ORDER — SERTRALINE HCL 50 MG PO TABS
100.0000 mg | ORAL_TABLET | Freq: Every day | ORAL | Status: DC
  Administered 2021-09-22: 100 mg via ORAL
  Filled 2021-09-22: qty 2
  Filled 2021-09-22: qty 1

## 2021-09-22 MED ORDER — ACETAMINOPHEN 160 MG/5ML PO SOLN: 650.0000 mg | ORAL | Status: DC | PRN

## 2021-09-22 NOTE — Plan of Care (Signed)
  Problem: Education: Goal: Knowledge of General Education information will improve Description: Including pain rating scale, medication(s)/side effects and non-pharmacologic comfort measures Outcome: Progressing   Problem: Activity: Goal: Risk for activity intolerance will decrease Outcome: Progressing   Problem: Pain Managment: Goal: General experience of comfort will improve Outcome: Progressing   

## 2021-09-22 NOTE — Progress Notes (Signed)
OT Cancellation Note  Patient Details Name: Roy [name] MRN: [id number] DOB: April 20, 1996   Cancelled Treatment:    Reason Eval/Treat Not Completed: Active bedrest order. Will assess when activity orders updated. thanks  [facility] Roy [name], OT/L   Acute OT Clinical Specialist Acute Rehabilitation Services Pager [phone] Office [phone]  09/22/2021, 7:38 AM

## 2021-09-22 NOTE — Plan of Care (Signed)
°  Problem: Education: Goal: Knowledge of General Education information will improve Description: Including pain rating scale, medication(s)/side effects and non-pharmacologic comfort measures 09/22/2021 1417 by Toma Copier, RN Outcome: Adequate for Discharge 09/22/2021 [id number] by Toma Copier, RN Outcome: Progressing   Problem: Health Behavior/Discharge Planning: Goal: Ability to manage health-related needs will improve Outcome: Adequate for Discharge   Problem: Clinical Measurements: Goal: Ability to maintain clinical measurements within normal limits will improve Outcome: Adequate for Discharge Goal: Will remain free from infection Outcome: Adequate for Discharge Goal: Diagnostic test results will improve Outcome: Adequate for Discharge Goal: Respiratory complications will improve Outcome: Adequate for Discharge Goal: Cardiovascular complication will be avoided Outcome: Adequate for Discharge   Problem: Activity: Goal: Risk for activity intolerance will decrease 09/22/2021 1417 by Toma Copier, RN Outcome: Adequate for Discharge 09/22/2021 [id number] by Toma Copier, RN Outcome: Progressing   Problem: Nutrition: Goal: Adequate nutrition will be maintained Outcome: Adequate for Discharge   Problem: Coping: Goal: Level of anxiety will decrease Outcome: Adequate for Discharge   Problem: Elimination: Goal: Will not experience complications related to bowel motility Outcome: Adequate for Discharge Goal: Will not experience complications related to urinary retention Outcome: Adequate for Discharge   Problem: Pain Managment: Goal: General experience of comfort will improve 09/22/2021 1417 by Toma Copier, RN Outcome: Adequate for Discharge 09/22/2021 [id number] by Toma Copier, RN Outcome: Progressing   Problem: Safety: Goal: Ability to remain free from injury will improve Outcome: Adequate for Discharge   Problem: Skin Integrity: Goal: Risk for impaired skin  integrity will decrease Outcome: Adequate for Discharge   Problem: Education: Goal: Knowledge of disease or condition will improve Outcome: Adequate for Discharge Goal: Knowledge of secondary prevention will improve (SELECT ALL) Outcome: Adequate for Discharge Goal: Knowledge of patient specific risk factors will improve (INDIVIDUALIZE FOR PATIENT) Outcome: Adequate for Discharge Goal: Individualized Educational Video(s) Outcome: Adequate for Discharge   Problem: Coping: Goal: Will verbalize positive feelings about self Outcome: Adequate for Discharge   Problem: Health Behavior/Discharge Planning: Goal: Ability to manage health-related needs will improve Outcome: Adequate for Discharge   Problem: Self-Care: Goal: Ability to participate in self-care as condition permits will improve Outcome: Adequate for Discharge Goal: Ability to communicate needs accurately will improve Outcome: Adequate for Discharge   Problem: Intracerebral Hemorrhage Tissue Perfusion: Goal: Complications of Intracerebral Hemorrhage will be minimized Outcome: Adequate for Discharge

## 2021-09-22 NOTE — Progress Notes (Signed)
CSW received request for patient and significant other to get a ride home. CSW confirmed address on Facesheet with patient; rider waiver emailed. CSW scheduled ride through [place]. RNCM following up on potential charity home health. No other needs identified at this time.   Joaquin Courts, MSW, [facility]

## 2021-09-22 NOTE — Evaluation (Signed)
Physical Therapy Evaluation Patient Details Name: Roy [name] MRN: [id number] DOB: 12/27/1995 Today's Date: 09/22/2021  History of Present Illness  26 y.o. 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 CT demosntrated acute/subacute 2.5cm R occipital lobe hemorrhage.   Clinical Impression  Pt recently d/c'd from CIR and living with sister. Pt reports staying in hospital bed or w/c most of the day and receiving little help from sister for ADLs and mobility. Pt with noted ataxia and fine motor deficits requiring modA for safe amb with RW and assist for ADLs. Pt to benefit from outpt PT if transportation could be set up to advance mobility towards indep. Acute PT to cont to follow.       Recommendations for follow up therapy are one component of a multi-disciplinary discharge planning process, led by the attending physician.  Recommendations may be updated based on patient status, additional functional criteria and insurance authorization.  Follow Up Recommendations Outpatient PT (will need transportation set up)    Assistance Recommended at Discharge Frequent or constant Supervision/Assistance  Patient can return home with the following  A little help with bathing/dressing/bathroom;Assistance with cooking/housework;Direct supervision/assist for medications management;Direct supervision/assist for financial management;Assist for transportation    Equipment Recommendations  (has all DME)  Recommendations for Other Services       Functional Status Assessment Patient has had a recent decline in their functional status and/or demonstrates limited ability to make significant improvements in function in a reasonable and  predictable amount of time     Precautions / Restrictions Precautions Precautions: Fall Precaution Comments: ataxic Restrictions Weight Bearing Restrictions: No      Mobility  Bed Mobility Overal bed mobility: Needs Assistance Bed Mobility: Supine to Sit     Supine to sit: Min guard     General bed mobility comments: HOB elevated, pt able to bring self to EOB with increased time (Simultaneous filing. User may not have seen previous data.)    Transfers Overall transfer level: Needs assistance Equipment used: Rolling walker (2 wheels) Transfers: Sit to/from Stand Sit to Stand: Min assist           General transfer comment: minA to power up and steady during transition of hands from bed to RW, incrased time, shaky/tremoring,  R LE with noted clonus type movement (Simultaneous filing. User may not have seen previous data.)    Ambulation/Gait Ambulation/Gait assistance: Mod assist Gait Distance (Feet): 15 Feet Assistive device: Rolling walker (2 wheels) Gait Pattern/deviations: Step-to pattern;Decreased stride length;Decreased dorsiflexion - right;Decreased dorsiflexion - left;Ataxic;Wide base of support Gait velocity: slow Gait velocity interpretation: <1.8 ft/sec, indicate of risk for recurrent falls   General Gait Details: pt with impaired co-ordination/ataxia, difficulty sequencing appropriate step length and noted instability with falling to the L x2 requiring modA to prevent fall, modA for walker management to keep close to pt and during turning as pt unable to manage  Stairs            Wheelchair Mobility    Modified Rankin (Stroke Patients Only) Modified Rankin (Stroke Patients Only) Pre-Morbid Rankin Score: Moderately severe disability Modified Rankin: Moderately severe disability     Balance Overall balance assessment: Needs assistance Sitting-balance support: Feet supported;No upper extremity supported Sitting balance-Leahy Scale: Fair  Standing balance support: Bilateral upper extremity supported;During functional activity Standing balance-Leahy Scale: Poor Standing balance comment: dependent on RW                             Pertinent Vitals/Pain Pain Assessment: No/denies pain (Simultaneous filing. User may not have seen previous data.)    Home Living Family/patient expects to be discharged to:: Private residence (Simultaneous filing. User may not have seen previous data.) Living Arrangements: Other relatives (sister, and her 5 kids  Simultaneous filing. User may not have seen previous data.) Available Help at Discharge: Family;Available 24 hours/day (Simultaneous filing. User may not have seen previous data.) Type of Home: House (Simultaneous filing. User may not have seen previous data.) Home Access: Stairs to enter (Simultaneous filing. User may not have seen previous data.)   Entrance Stairs-Number of Steps: 2   Home Layout: One level Home Equipment: Agricultural consultant (2 wheels);Tub bench;Wheelchair - manual;Hospital bed Additional Comments: pt lives at sisters house however reports she doesn't assist him at all, that he lays in bed all day or stays in w/c (Simultaneous filing. User may not have seen previous data.)    Prior Function Prior Level of Function : Needs assist (Simultaneous filing. User may not have seen previous data.)       Physical Assist : Mobility (physical);ADLs (physical) Mobility (physical): Transfers;Gait;Stairs;Bed mobility ADLs (physical): Feeding;Grooming;Bathing;Dressing;Toileting;IADLs Mobility Comments: uses w/c primarily, uses RW occasionally, reports staying in hospital bed most of time (Simultaneous filing. User may not have seen previous data.) ADLs Comments: reports wearing a diaper in which his sister doesn't help change, pt reports "my sister doesn't help me bath and I can't do it all on my own"     Hand Dominance   Dominant Hand: Right (Simultaneous filing. User  may not have seen previous data.)    Extremity/Trunk Assessment   Upper Extremity Assessment Upper Extremity Assessment: Defer to OT evaluation (Simultaneous filing. User may not have seen previous data.) RUE Deficits / Details: using functionally however frequently drops items; decreased in-hand manipulation/fine motor skills; general AROM WFL RUE Coordination: decreased fine motor LUE Deficits / Details: unablet o "feel "LUE however attempting to use; aprxic LUE Sensation: decreased light touch;decreased proprioception LUE Coordination: decreased fine motor;decreased gross motor    Lower Extremity Assessment Lower Extremity Assessment: Generalized weakness (bilat LE ataxia  Simultaneous filing. User may not have seen previous data.)    Cervical / Trunk Assessment Cervical / Trunk Assessment: Normal (Simultaneous filing. User may not have seen previous data.)  Communication   Communication: Expressive difficulties (Simultaneous filing. User may not have seen previous data.)  Cognition Arousal/Alertness: Awake/alert (Simultaneous filing. User may not have seen previous data.) Behavior During Therapy: [facility] for tasks assessed/performed (Simultaneous filing. User may not have seen previous data.) Overall Cognitive Status: Within Functional Limits for tasks assessed (Simultaneous filing. User may not have seen previous data.)                                          General Comments General comments (skin integrity, edema, etc.): VSS    Exercises     Assessment/Plan    PT Assessment Patient needs continued PT services  PT Problem List Decreased strength;Decreased activity tolerance;Decreased balance;Decreased range of motion;Decreased mobility;Decreased coordination;Decreased cognition;Decreased knowledge of use of DME       PT Treatment  Interventions DME instruction;Gait training;Therapeutic activities;Functional mobility training;Stair training;Therapeutic  exercise;Balance training    PT Goals (Current goals can be found in the Care Plan section)  Acute Rehab PT Goals Patient Stated Goal: walk better PT Goal Formulation: With patient Time For Goal Achievement: 10/06/21 Potential to Achieve Goals: Good    Frequency Min 4X/week     Co-evaluation               AM-PAC PT "6 Clicks" Mobility  Outcome Measure Help needed turning from your back to your side while in a flat bed without using bedrails?: None Help needed moving from lying on your back to sitting on the side of a flat bed without using bedrails?: None Help needed moving to and from a bed to a chair (including a wheelchair)?: A Little Help needed standing up from a chair using your arms (e.g., wheelchair or bedside chair)?: A Little Help needed to walk in hospital room?: A Lot Help needed climbing 3-5 steps with a railing? : A Lot 6 Click Score: 18    End of Session Equipment Utilized During Treatment: Gait belt Activity Tolerance: Patient tolerated treatment well Patient left: in chair;with call bell/phone within reach;with chair alarm set;with family/visitor present Nurse Communication: Mobility status PT Visit Diagnosis: Unsteadiness on feet (R26.81);Muscle weakness (generalized) (M62.81);Difficulty in walking, not elsewhere classified (R26.2)    Time: 5993-5701 PT Time Calculation (min) (ACUTE ONLY): 25 min   Charges:   PT Evaluation $PT Eval Moderate Complexity: 1 Mod PT Treatments $Gait Training: 8-22 mins        Roy [name], PT, DPT Acute Rehabilitation Services Pager #: [phone] Office #: [phone]   Iona Hansen 09/22/2021, 12:50 PM

## 2021-09-22 NOTE — Progress Notes (Signed)
Occupational Therapy Treatment Note    09/22/21 1248  OT Visit Information  Last OT Received On 09/22/21  Assistance Needed +1  History of Present Illness 26 y.o. 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 CT demosntrated acute/subacute 2.5cm R occipital lobe hemorrhage.  Precautions  Precautions Fall  Precaution Comments ataxic  Pain Assessment  Pain Assessment No/denies pain  ADL  General ADL Comments Pt issued built up fork, spoon and red tubing to build up handles. Also issued non-slip table material in addition to lidded/handle cup to use with L hand. Pt ablet o self feed after appropriate set up and with use of AE. Pt/girlfreind educated on proper use of AE adn set up. Pt does best with "finger foods", however able to use adapted utensils to self feed.  Bed Mobility  General bed mobility comments OOB in chair  Exercises  Exercises Other exercises  Other Exercises  Other Exercises squeeze ball  Other Exercises fine motor/coordination  Other Exercises saccades adn pursuits  OT - End of Session  Activity Tolerance Patient tolerated treatment well  Patient left in chair;with call bell/phone within reach  Nurse Communication Other (comment) (DC needs)  OT Assessment/Plan  OT Plan Discharge plan remains appropriate  OT Visit Diagnosis Unsteadiness on feet (R26.81);Other abnormalities of gait and mobility (R26.89);Muscle weakness (generalized) (M62.81);Apraxia (R48.2);Ataxia, unspecified (R27.0);Other symptoms and signs involving cognitive function  Follow Up Recommendations Outpatient OT  Assistance recommended at discharge Frequent or constant Supervision/Assistance  Patient can return home with the  following A lot of help with walking and/or transfers;A lot of help with bathing/dressing/bathroom;Assistance with feeding;Direct supervision/assist for medications management;Direct supervision/assist for financial management;Assist for transportation;Help with stairs or ramp for entrance  OT Equipment None recommended by OT  AM-PAC OT "6 Clicks" Daily Activity Outcome Measure (Version 2)  Help from another person eating meals? 2  Help from another person taking care of personal grooming? 3  Help from another person toileting, which includes using toliet, bedpan, or urinal? 2  Help from another person bathing (including washing, rinsing, drying)? 2  Help from another person to put on and taking off regular upper body clothing? 3  Help from another person to put on and taking off regular lower body clothing? 2  6 Click Score 14  Progressive Mobility  What is the highest level of mobility based on the progressive mobility assessment? Level 4 (Walks with assist in room) - Balance while marching in place and cannot step forward and back - Complete  Mobility Out of bed for toileting;Out of bed to chair with meals  OT Goal Progression  Progress towards OT goals Progressing toward goals  Acute Rehab OT Goals  Patient Stated Goal to get better  OT Goal Formulation With patient/family  OT Time Calculation  OT Start Time (ACUTE ONLY) 1135  OT Stop Time (ACUTE ONLY) 1151  OT Time Calculation (min) 16 min  OT General Charges  $OT Visit 1 Visit  OT Treatments  $Self Care/Home Management  8-22 mins   Luisa Dago, OT/L   Acute OT Clinical Specialist Acute Rehabilitation Services Pager [phone] Office [phone]

## 2021-09-22 NOTE — Evaluation (Signed)
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   Cerebral thrombosis with cerebral infarction 07/27/2021   Cerebral embolism with cerebral infarction 07/27/2021   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   Seizure (HCC) 07/26/2021   Past Medical History:  Past Medical History:  Diagnosis Date   CVA (cerebral vascular accident) [facility])    Past Surgical History:  Past Surgical History:  Procedure Laterality Date   IR ANGIO EXTRACRAN SEL COM CAROTID INNOMINATE UNI R MOD SED  07/27/2021   IR CT HEAD LTD  07/27/2021   IR INTRA CRAN STENT  07/27/2021   IR PERCUTANEOUS ART THROMBECTOMY/INFUSION INTRACRANIAL INC DIAG ANGIO  07/27/2021   RADIOLOGY WITH ANESTHESIA N/A 07/27/2021   Procedure: IR WITH ANESTHESIA;  Surgeon: Julieanne Cotton, MD;  Location: [facility] OR;  Service: Radiology;  Laterality: N/A;   HPI:  Roy [name] is a 26 y.o. 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 Workup with CTH, CTA and MRI with a 2.5cm subacute R occipital lobe Intraparenchymal hemorrhage. Pt was D/Cd from inpt rehab on 12/22 with mild dysarthria.   Assessment / Plan / Recommendation Clinical Impression  Roy [name] presents with mild deficits in higher level attention and a  persisting mild dysarthria of speech, mild dysfluency which he describes as consistent with baseline and improving overall since Nov CVA. He demonstrates recognition and self-correction of speech inaccuracies. He performed well on tasks of word and paragraph recall, divergent naming, orientation, mental calculations, problem solving. Expressive/receptive language are WNL.   Pt reports no HH services post-discharge from [facility] - unsure if this was due to scheduling delays vs insurance issues. Recommend resuming SLP f/u for dysarthria while here.    SLP Assessment  SLP Recommendation/Assessment: Patient needs continued Speech Lanaguage Pathology Services SLP Visit Diagnosis: Dysarthria and anarthria (R47.1)    Recommendations for follow up therapy are one component of a multi-disciplinary discharge planning process, led by the attending physician.  Recommendations may be updated based on patient status, additional functional criteria and insurance authorization.    Follow Up Recommendations  Other (comment) (Pending OT/PT evals)            Frequency and Duration min 2x/week  1 week      SLP Evaluation Cognition  Overall Cognitive Status: Within Functional Limits for tasks assessed Arousal/Alertness: Awake/alert Orientation Level: Oriented X4 Attention: Selective Sustained Attention: Appears intact Selective Attention: Impaired Selective Attention Impairment: Verbal complex Memory: Appears intact Awareness: Appears intact Problem Solving: Appears intact       Comprehension  Auditory Comprehension Overall Auditory Comprehension: Appears within functional limits for tasks assessed    Expression Expression Primary Mode of Expression: Verbal Verbal Expression Overall Verbal Expression: Appears within functional limits for tasks assessed Written Expression Dominant Hand: Right   Oral /  Motor  Oral Motor/Sensory Function Overall Oral Motor/Sensory Function: Within functional  limits Motor Speech Overall Motor Speech: Impaired Respiration: Within functional limits Level of Impairment: Sentence Phonation: Normal Resonance: Hypernasality Articulation: Impaired Level of Impairment: Phrase Intelligibility: Intelligibility reduced Conversation: 75-100% accurate Motor Speech Errors: Aware            Blenda Mounts Laurice 09/22/2021, 9:33 AM Marchelle Folks L. Samson Frederic, MA CCC/SLP Acute Rehabilitation Services Office number [phone] Pager [phone]

## 2021-09-22 NOTE — ED Notes (Signed)
Pt passed swallow screen and given food with approval of neurologist

## 2021-09-22 NOTE — Discharge Summary (Addendum)
Stroke Discharge Summary  Patient ID: Roy [name]    l   MRN: [id number]      DOB: 04/23/1996  Date of Admission: 09/21/2021 Date of Discharge: 09/22/2021  Attending Physician:  Erick BlinksKhaliqdina, Salman, MD, Stroke MD Consultant(s):    Dr. 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   aspirin 81 MG chewable tablet Chew 1 tablet (81 mg total) by mouth daily.   atorvastatin 40 MG tablet Commonly known as: LIPITOR Take 1 tablet (40 mg total) by mouth at bedtime.   folic acid 1 MG tablet Commonly known as: FOLVITE Take 1 tablet (1 mg total) by mouth daily.   melatonin 5 MG Tabs Take 1 tablet (5 mg total) by mouth at bedtime.   multivitamin with minerals Tabs tablet Take 1 tablet by mouth daily.   polyethylene glycol 17 g packet Commonly known as: MIRALAX / GLYCOLAX Take 17 g by mouth 2 (two) times daily.   QUEtiapine 25 MG tablet Commonly known as: SEROQUEL Take 1 tablet (25 mg total) by mouth at bedtime as needed (insomnia).   sertraline 100 MG tablet Commonly known as: ZOLOFT Take 1 tablet (100 mg total) by mouth daily.        LABORATORY STUDIES CBC    Component Value Date/Time   WBC 10.5 09/21/2021 2241   RBC 4.15 (L) 09/21/2021 2241   HGB 12.7  (L) 09/21/2021 2241   HCT 37.6 (L) 09/21/2021 2241   PLT 403 (H) 09/21/2021 2241   MCV 90.6 09/21/2021 2241   MCH 30.6 09/21/2021 2241   MCHC 33.8 09/21/2021 2241   RDW 13.0 09/21/2021 2241   LYMPHSABS 1.6 09/21/2021 2241   MONOABS 1.0 09/21/2021 2241   EOSABS 0.2 09/21/2021 2241   BASOSABS 0.1 09/21/2021 2241   CMP    Component Value Date/Time   NA 139 09/21/2021 2241   K 3.3 (L) 09/21/2021 2241   CL 106 09/21/2021 2241   CO2 25 09/21/2021 2241   GLUCOSE 90 09/21/2021 2241   BUN 7 09/21/2021 2241   CREATININE 0.98 09/21/2021 2241   CALCIUM 9.0 09/21/2021 2241   PROT 6.9 09/21/2021 2241   ALBUMIN 3.5 09/21/2021 2241   AST 22 09/21/2021 2241   ALT 26 09/21/2021 2241   ALKPHOS 98 09/21/2021 2241   BILITOT 0.6 09/21/2021 2241   GFRNONAA >60 09/21/2021 2241   COAGSNo results found for: INR, PROTIME Lipid Panel    Component Value Date/Time   CHOL 199 07/28/2021 0519   TRIG 230 (H) 07/28/2021 0519   HDL 37 (L) 07/28/2021 0519   CHOLHDL 5.4 07/28/2021 0519   VLDL 46 (H) 07/28/2021 0519   LDLCALC 116 (H) 07/28/2021 0519   HgbA1C  Lab Results  Component Value Date   HGBA1C 5.7 (H) 07/27/2021  Urinalysis    Component Value Date/Time   COLORURINE AMBER (A) 08/03/2021 1654   APPEARANCEUR HAZY (A) 08/03/2021 1654   LABSPEC 1.035 (H) 08/03/2021 1654   PHURINE 5.0 08/03/2021 1654   GLUCOSEU NEGATIVE 08/03/2021 1654   HGBUR NEGATIVE 08/03/2021 1654   BILIRUBINUR NEGATIVE 08/03/2021 1654   KETONESUR NEGATIVE 08/03/2021 1654   PROTEINUR NEGATIVE 08/03/2021 1654   NITRITE NEGATIVE 08/03/2021 1654   LEUKOCYTESUR NEGATIVE 08/03/2021 1654   Urine Drug Screen     Component Value Date/Time   LABOPIA NONE DETECTED 07/27/2021 0606   COCAINSCRNUR NONE DETECTED 07/27/2021 0606   LABBENZ NONE DETECTED 07/27/2021 0606   AMPHETMU NONE DETECTED 07/27/2021 0606   THCU POSITIVE (A) 07/27/2021 0606   LABBARB NONE DETECTED 07/27/2021 0606    Alcohol Level    Component Value  Date/Time   ETH <10 07/27/2021 0031     SIGNIFICANT DIAGNOSTIC STUDIES CT ANGIO HEAD NECK W WO CM  Addendum Date: 09/22/2021   ADDENDUM REPORT: 09/22/2021 05:38 ADDENDUM: Findings also communicated to Dr. Eudelia Bunch of the emergency department by telephone at approximately 3 a.m. on 09/22/2021. Electronically Signed   By: Rise Mu M.D.   On: 09/22/2021 05:38   Result Date: 09/22/2021 CLINICAL DATA:  Initial evaluation for acute dizziness. EXAM: CT ANGIOGRAPHY HEAD AND NECK TECHNIQUE: Multidetector CT imaging of the head and neck was performed using the standard protocol during bolus administration of intravenous contrast. Multiplanar CT image reconstructions and MIPs were obtained to evaluate the vascular anatomy. Carotid stenosis measurements (when applicable) are obtained utilizing NASCET criteria, using the distal internal carotid diameter as the denominator. CONTRAST:  75mL OMNIPAQUE IOHEXOL 350 MG/ML SOLN COMPARISON:  MRI from 07/27/2021. FINDINGS: CT HEAD FINDINGS Brain: Continued interval evolution of previously identified posterior circulation infarcts involving the right thalamus, right occipital lobe, brainstem, and cerebellum. Overall distribution is similar to previous. Now seen is a small amount of hemorrhage at the level of the right occipital lobe (series 4, image 13). Area of measures 2.3 cm in size. Petechial blood products were present at this location on prior MRI. No significant regional mass effect. No other visible hemorrhage by CT. No other acute large vessel territory infarct. No midline shift or significant mass effect. No visible mass lesion. No hydrocephalus. No extra-axial fluid collection. Vascular: No hyperdense vessel. Skull: Scalp soft tissues and calvarium within normal limits. Sinuses: Clear. Orbits: Unremarkable. Review of the MIP images confirms the above findings CTA NECK FINDINGS Aortic arch: Visualized aortic arch normal caliber with normal branch pattern. No  stenosis about the origin of the great vessels. Right carotid system: Right common and internal carotid arteries widely patent without stenosis, dissection or occlusion. Left carotid system: Left common and internal carotid arteries widely patent without stenosis, dissection or occlusion. Vertebral arteries: Both vertebral arteries arise from the subclavian arteries. No proximal subclavian artery stenosis. Interval stenting at the proximal left V1 segment at site of previously identified dissection. Grossly patent flow through the stent. No visible residual dissection by CT. Vertebral arteries otherwise patent without stenosis or other new finding. Skeleton: Unremarkable. Other neck: No other acute soft tissue abnormality within the neck. Upper chest: Visualized upper chest demonstrates no acute finding. Review of the MIP images confirms the above findings CTA HEAD FINDINGS Anterior circulation: Both internal carotid arteries widely patent to the termini without stenosis. A1 segments widely patent. Normal anterior communicating artery complex. Both anterior cerebral arteries widely patent to their distal aspects without stenosis. No M1 stenosis or occlusion. Normal  MCA bifurcations. Distal MCA branches well perfused and symmetric. Posterior circulation: Both vertebral arteries patent to the vertebrobasilar junction without stenosis. Left vertebral artery slightly dominant. Both PICA patent. Basilar patent to its distal aspect. Superior cerebral arteries patent bilaterally. Both PCAs primarily supplied via the basilar. PCAs now patent to their distal aspects without stenosis or other abnormality. Venous sinuses: Patent allowing for timing the contrast bolus. Anatomic variants: None significant.  No aneurysm. Review of the MIP images confirms the above findings IMPRESSION: 1. Negative CTA for large vessel occlusion. 2. Interval stenting at the proximal left V1 segment at site of previously identified dissection.  Grossly patent flow through the stent. No visible residual dissection by CT. 3. Continued interval evolution of previously identified posterior circulation infarcts, with new 2.3 cm focus of hemorrhage at the level of the right occipital lobe. No significant regional mass effect. 4. No other new acute intracranial abnormality. These results were communicated to Dr. Derry Lory at 2:51 am on 09/22/2021 by text page via the [facility] messaging system. Electronically Signed: By: Rise Mu M.D. On: 09/22/2021 02:53   CT HEAD WO CONTRAST  Result Date: 09/22/2021 CLINICAL DATA:  26 year old male with dizziness. Vertebral dissection, basilar artery thromboembolism, posterior circulation infarcts in November. Right occipital lobe petechial hemorrhage that month with more conspicuous blood products there on MRI and CTA earlier today. EXAM: CT HEAD WITHOUT CONTRAST TECHNIQUE: Contiguous axial images were obtained from the base of the skull through the vertex without intravenous contrast. COMPARISON:  CTA 0037 hours today.  Brain MRI 09/21/2021. FINDINGS: Brain: Small area of parenchymal hemorrhage in the medial right occipital lobe (coronal image 57) appears unchanged from the CTA earlier today, measuring up to 19 mm long axis, with no regional mass effect. Regional developing encephalomalacia. No other intracranial hemorrhage identified. No ventriculomegaly. Prominent right thalamic encephalomalacia. Scattered cerebellar and left brainstem encephalomalacia. Stable gray-white matter differentiation throughout the brain. No cortically based acute infarct identified. No midline shift, mass effect, or evidence of intracranial mass lesion. Vascular: No suspicious intracranial vascular hyperdensity. Skull: Negative. Sinuses/Orbits: Visualized paranasal sinuses and mastoids are stable and well aerated. Other: Visualized orbits and scalp soft tissues are within normal limits. IMPRESSION: 1. Small 1.9 cm area of right  occipital lobe blood appears stable since 0038 hours today and is most compatible with post ischemic petechial hemorrhage. 2. Otherwise stable multifocal posterior circulation developing encephalomalacia. No new intracranial abnormality. Electronically Signed   By: Odessa Fleming M.D.   On: 09/22/2021 07:12   MR BRAIN WO CONTRAST  Addendum Date: 09/22/2021   ADDENDUM REPORT: 09/22/2021 02:55 ADDENDUM: Upon further review, associated susceptibility artifact at the level of the evolving right occipital infarct appears to reflect a small focus of acute to subacute hemorrhage, better seen on corresponding CTA performed on the same day. While some petechial hemorrhage with seen at this location on previous MRI from 07/27/2021, the parenchymal hemorrhage seen on today's exam appears to be new. No significant mass effect. Electronically Signed   By: Rise Mu M.D.   On: 09/22/2021 02:55   Result Date: 09/22/2021 CLINICAL DATA:  Initial evaluation for acute dizziness. EXAM: MRI HEAD WITHOUT CONTRAST TECHNIQUE: Multiplanar, multiecho pulse sequences of the brain and surrounding structures were obtained without intravenous contrast. COMPARISON:  MRI from 07/27/2021. FINDINGS: Brain: There has been normal expected interval evolution of previously identified multiple scattered posterior circulation infarcts. Mild residual diffusion signal abnormality present at the midbrain and pons. Associated scattered chronic hemosiderin staining present about several of  these infarcts. No significant mass effect. No other evidence for new or interval ischemia. Gray-white matter differentiation maintained. No acute intracranial hemorrhage. No mass lesion, midline shift or mass effect. No hydrocephalus or extra-axial fluid collection. Pituitary gland suprasellar region normal. Midline structures intact. Vascular: Major intracranial vascular flow voids are maintained. Skull and upper cervical spine: Craniocervical junction within  normal limits. Bone marrow signal intensity normal. No scalp soft tissue abnormality. Sinuses/Orbits: Globes and orbital soft tissues within normal limits. Paranasal sinuses are largely clear. No mastoid effusion. Other: None. IMPRESSION: 1. Normal expected interval evolution of previously identified scattered posterior circulation infarcts. 2. No other new acute or interval intracranial abnormality. Electronically Signed: By: Rise Mu M.D. On: 09/22/2021 01:42      HISTORY OF PRESENT ILLNESS Roy [name] is a 26 y.o. 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 He came to the ED where he had CTH which demosntrated acute/subacute 2.5cm R occipital lobe hemorrhage. CTA with stable patent stent in left vertebral artery. He denies any other complaints at this time. Has been taking aspirin and Brilinta. No fall, no trauma. PT/OT/ST evaluation while inpatient.   HOSPITAL COURSE Stroke:  petechial HT of previous right occipital infarct infarct on dual antiplatelet therapy for recent VA stenting.  CT head - 1.9cm post ischemic petechial hemorrhage in right occipital lobe- stable from previous CTA CTA head & neck- Interval stenting at the proximal left V1 segment at site of previous dissection. Continued interval evolution of previous posterior infarct, new 2.3cm petechial hemorrhage at right occipital lobe.  MRI  expected evolution of scattered posterior circulation infarcts, right occipital HT LDL 116 HgbA1c 5.7 aspirin 81 mg daily and Brilinta (ticagrelor) 90 mg bid prior to admission, now on aspirin 81 mg daily. Per Dr. Corliss Skains discontinue Brilinta and continue ASA 81mg   Therapy  recommendations:  Home with outpatient PT Disposition:  Home   Hyperlipidemia Home meds:  Atorvastatin 40mg , resumed in hospital LDL 116, goal < 70 High intensity statin indicated Continue statin at discharge   Other Stroke Risk Factors Substance abuse - UDS:  THC POSITIVE, Cocaine NONE DETECTED. Patient advised to stop using due to stroke risk. Obesity, Body mass index is 36.05 kg/m., BMI >/= 30 associated with increased stroke risk, recommend weight loss, diet and exercise as appropriate  Hx stroke/TIA 07/26/2021- scattered posterior circulation infarcts secondary to left VA dissection with distal embolization to top of the basilar s/p IR with TICI2c and left VA stenting Tobacco abuse Current smoker Smoking cessation counseling provided Pt is willing to quit   Other Active Problems Depression Zoloft  DISCHARGE EXAM Blood pressure 115/70, pulse 86, temperature 98.1 F (36.7 C), temperature source Oral, resp. rate (!) 23, height 5\' 2"  (1.575 m), weight 89.4 kg, SpO2 97 %. General: Laying comfortably in bed; in no acute distress.  CV: No JVD. No peripheral edema.  Pulmonary: Symmetric Chest rise. Normal respiratory effort.   Neuro:  Mental status/Cognition: Alert, oriented to self, place, month and year, good attention.  Speech/language: Mildly dysarthric speech, fluent, comprehension intact, object naming intact, repetition intact.  Cranial nerves: PERRLA, no VF deficits, EOMI, no sensation deficits, facial symmetry intact, tongue is midline, head is midline Motor: Muscle  bulk: normal, tone normal, pronator drift none tremor none Coordination: HTK-  ataxia in left worse than right. FTN intact bilaterally  Sensation:Decreaed to light touch in Left face, L arm and L leg.   DISCHARGE PLAN Disposition:  Home Ongoing stroke risk factor control by Primary Care Physician at time of discharge Follow-up PCP in 2 weeks. Follow-up in [facility] in 4  weeks, office to schedule an appointment.   35 minutes were spent preparing discharge.  Patient seen and examined by NP/APP with MD. MD to update note as needed.   Elmer Pickerevon Shafer, DNP, FNP-BC [facility] Pager: [phone]  I have personally obtained history,examined this patient, reviewed notes, independently viewed imaging studies, participated in medical decision making and plan of care.ROS completed by me personally and pertinent positives fully documented  I have made any additions or clarifications directly to the above note. Agree with note above.    Delia HeadyPramod Devory Mckinzie, MD Medical Director [facility] Pager: [phone] 09/22/2021 4:41 PM

## 2021-09-22 NOTE — Evaluation (Deleted)
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   Cerebral thrombosis with cerebral infarction 07/27/2021   Cerebral embolism with cerebral infarction 07/27/2021   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   Seizure (HCC) 07/26/2021   Past Medical History:  Past Medical History:  Diagnosis Date   CVA (cerebral vascular accident) [facility])    Past Surgical History:  Past Surgical History:  Procedure Laterality Date   IR ANGIO EXTRACRAN SEL COM CAROTID INNOMINATE UNI R MOD SED  07/27/2021   IR CT HEAD LTD  07/27/2021   IR INTRA CRAN STENT  07/27/2021   IR PERCUTANEOUS ART THROMBECTOMY/INFUSION INTRACRANIAL INC DIAG ANGIO  07/27/2021   RADIOLOGY WITH ANESTHESIA N/A 07/27/2021   Procedure: IR WITH ANESTHESIA;  Surgeon: Julieanne Cotton, MD;  Location: [facility] OR;  Service: Radiology;  Laterality: N/A;   HPI:  Roy [name] is a 26 y.o. 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 Workup with CTH, CTA and MRI with a 2.5cm subacute R occipital lobe Intraparenchymal hemorrhage. Pt was D/Cd from inpt rehab on 12/22 with mild dysarthria.   Assessment / Plan / Recommendation Clinical Impression  Roy [name] presents with mild deficits in higher level attention and a  persisting mild dysarthria of speech, mild dysfluency which he describes as consistent with baseline and improving overall since Nov CVA. He demonstrates recognition and self-correction of speech inaccuracies. He performed well on tasks of word and paragraph recall, divergent naming, orientation, mental calculations, problem solving. Expressive/receptive language are WNL.   Pt reports no HH services post-discharge from [facility] - unsure if this was due to scheduling delays vs insurance issues. Recommend resuming SLP f/u for dysarthria while here.    SLP Assessment  SLP Recommendation/Assessment: Patient needs continued Speech Lanaguage Pathology Services SLP Visit Diagnosis: Dysarthria and anarthria (R47.1)    Recommendations for follow up therapy are one component of a multi-disciplinary discharge planning process, led by the attending physician.  Recommendations may be updated based on patient status, additional functional criteria and insurance authorization.    Follow Up Recommendations  Other (comment) (Pending OT/PT evals)            Frequency and Duration min 2x/week  1 week      SLP Evaluation Cognition  Overall Cognitive Status: Within Functional Limits for tasks assessed Arousal/Alertness: Awake/alert Orientation Level: Oriented X4 Attention: Selective Sustained Attention: Appears intact Selective Attention: Impaired Selective Attention Impairment: Verbal complex Memory: Appears intact Awareness: Appears intact Problem Solving: Appears intact       Comprehension  Auditory Comprehension Overall Auditory Comprehension: Appears within functional limits for tasks assessed    Expression Expression Primary Mode of Expression: Verbal Verbal Expression Overall Verbal Expression: Appears within functional limits for tasks assessed Written Expression Dominant Hand: Right   Oral /  Motor  Oral Motor/Sensory Function Overall Oral Motor/Sensory Function: Within functional  limits Motor Speech Overall Motor Speech: Impaired Respiration: Within functional limits Level of Impairment: Sentence Phonation: Normal Resonance: Hypernasality Articulation: Impaired Level of Impairment: Phrase Intelligibility: Intelligibility reduced Conversation: 75-100% accurate Motor Speech Errors: Aware            Roy [name] Laurice 09/22/2021, 10:01 AM Marchelle Folks L. Samson Frederic, MA CCC/SLP Acute Rehabilitation Services Office number [phone] Pager [phone]

## 2021-09-22 NOTE — Progress Notes (Signed)
Patient arrived from ED to 4N ICU room 25 GCS 15. Vital signs stable. Room air. Oliver Barre, RN

## 2021-09-22 NOTE — Evaluation (Addendum)
Occupational Therapy Evaluation Patient Details Name: Roy [name] MRN: [id number] DOB: May 08, 1996 Today's Date: 09/22/2021   History of Present Illness 26 y.o. 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 CT demosntrated acute/subacute 2.5cm R occipital lobe hemorrhage.   Clinical Impression   Pt recently discharged from [facility] and at that time [facility] follow up was recommended, however Pt has not been followed by [facility] services. Pt is at his "new" baseline since his CVA. Family/girlfriend assisting with mobility and ADL at  home. Feel pt would benefit from follow up with OT at the [facility]uo outpt center if transportation could be set up . Pt/girlfriend in agreement.  Will return to educate pt/girlfriend on AE and set up to help with self feeding.       Recommendations for follow up therapy are one component of a multi-disciplinary discharge planning process, led by the attending physician.  Recommendations may be updated based on patient status, additional functional criteria and insurance authorization.   Follow Up Recommendations  Outpatient OT (neuro)    Assistance Recommended at Discharge Frequent or constant Supervision/Assistance  Patient can return home with the following A lot of help with walking and/or transfers;A lot of help with bathing/dressing/bathroom;Assistance with feeding;Direct supervision/assist for medications management;Direct supervision/assist for financial management;Assist for transportation;Help with stairs or ramp for entrance    Functional Status Assessment  Patient has not had a recent decline in their functional status  Equipment Recommendations  None recommended by OT    Recommendations  for Other Services       Precautions / Restrictions Precautions Precautions: Fall Precaution Comments: ataxic      Mobility Bed Mobility               General bed mobility comments: OOB in chair    Transfers                   General transfer comment: mod A      Balance                                           ADL either performed or assessed with clinical judgement   ADL Overall ADL's : Needs assistance/impaired                                       General ADL Comments: at baseline of PTA; difficulty with self feeding     Vision   Vision Assessment?: Yes Additional Comments: diplopia in sidegaze; +nystagmus; not wearing patial occlusion glasses     Perception     Praxis      Pertinent Vitals/Pain Pain Assessment: No/denies pain     Hand Dominance Right   Extremity/Trunk Assessment Upper Extremity Assessment Upper Extremity Assessment: RUE deficits/detail;LUE deficits/detail RUE Deficits / Details: using functionally however frequently drops items; decreased in-hand manipulation/fine motor skills; general AROM WFL RUE Coordination: decreased fine motor LUE Deficits / Details: unablet o "feel "LUE however attempting to use; aprxic LUE Sensation: decreased light touch;decreased proprioception LUE Coordination: decreased fine motor;decreased gross motor  Lower Extremity Assessment Lower Extremity Assessment: Defer to PT evaluation   Cervical / Trunk Assessment Cervical / Trunk Assessment: Normal   Communication Communication Communication: Expressive difficulties   Cognition Arousal/Alertness: Awake/alert Behavior During Therapy: WFL for tasks assessed/performed Overall Cognitive Status: Within Functional Limits for tasks assessed                                       General Comments       Exercises     Shoulder Instructions      Home Living Family/patient expects to be  discharged to:: Private residence Living Arrangements: Other relatives;Other (Comment) Available Help at Discharge: Family Type of Home: House Home Access: Level entry           Bathroom Shower/Tub: Chief Strategy Officer: Standard         Additional Comments: sister/family assisting at home      Prior Functioning/Environment Prior Level of Function : Needs assist       Physical Assist : Mobility (physical);ADLs (physical) Mobility (physical): Transfers;Gait;Stairs;Bed mobility ADLs (physical): Feeding;Grooming;Bathing;Dressing;Toileting;IADLs Mobility Comments: girlfriend/sister assist with mobility          OT Problem List: Decreased strength;Decreased activity tolerance;Impaired balance (sitting and/or standing);Impaired vision/perception;Decreased coordination;Decreased cognition;Decreased safety awareness;Decreased knowledge of use of DME or AE;Impaired sensation;Impaired tone;Obesity;Impaired UE functional use;Pain      OT Treatment/Interventions:      OT Goals(Current goals can be found in the care plan section) Acute Rehab OT Goals Patient Stated Goal: to get better OT Goal Formulation: With patient/family  OT Frequency:      Co-evaluation              AM-PAC OT "6 Clicks" Daily Activity     Outcome Measure Help from another person eating meals?: A Lot Help from another person taking care of personal grooming?: A Little Help from another person toileting, which includes using toliet, bedpan, or urinal?: A Lot Help from another person bathing (including washing, rinsing, drying)?: A Lot Help from another person to put on and taking off regular upper body clothing?: A Little Help from another person to put on and taking off regular lower body clothing?: A Lot 6 Click Score: 14   End of Session Nurse Communication: Other (comment) (DC needs)  Activity Tolerance: Patient tolerated treatment well Patient left: in chair;with call bell/phone  within reach;with chair alarm set;with family/visitor present  OT Visit Diagnosis: Unsteadiness on feet (R26.81);Other abnormalities of gait and mobility (R26.89);Muscle weakness (generalized) (M62.81);Apraxia (R48.2);Ataxia, unspecified (R27.0);Other symptoms and signs involving cognitive function                Time: 1224-8250 OT Time Calculation (min): 18 min Charges:  OT General Charges $OT Visit: 1 Visit OT Evaluation $OT Eval Moderate Complexity: 1 Mod  Alvina Strother, OT/L   Acute OT Clinical Specialist Acute Rehabilitation Services Pager [phone] Office [phone]   [facility] 09/22/2021, 12:45 PM

## 2021-09-22 NOTE — TOC Transition Note (Signed)
Transition of Care [facility]) - CM/SW Discharge Note   Patient Details  Name: Roy [name] MRN: [id number] Date of Birth: [birth year]  [facility]) CM/SW Contact:  Glennon Mac, RN Phone Number: 09/22/2021, 3:09 PM   Clinical Narrative:    26 y.o. 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 CT demosntrated acute/subacute 2.5cm R occipital lobe hemorrhage PTA, pt requires assistance at home; lives with sister. Girlfriend at bedside; states she stays with patient at night.  OP PT/OT recommended, but patient does not have insurance or transportation to appointments.  Able to secure [facility] services with [facility] for HHPT/OT and CSW.  HH CSW to follow up with pending Medicaid application and social issues in the home.  Unit CSW to arrange transportation for pt to discharge home.  Pt/GF appreciative of assistance.    Final next level of care: Home w Home Health Services Barriers to Discharge: Barriers Resolved   Patient Goals and CMS Choice Patient states their goals for this hospitalization and ongoing recovery are:: to go home CMS Medicare.gov Compare Post Acute Care list provided to:: Patient Choice offered to / list presented to : Patient (significant other)                        Discharge Plan and Services   Discharge Planning Services: CM Consult Post Acute Care Choice: Home Health                    HH Arranged: PT, OT, Nurse's Aide HH Agency: [facility] Date [facility] Agency Contacted: 09/22/21 Time HH Agency Contacted: 1508 Representative spoke with at [facility] Agency: Ollen Barges  Social Determinants of Health (SDOH) Interventions     Readmission Risk Interventions No  flowsheet data found.  Quintella Baton, RN, BSN  Trauma/Neuro ICU Case Manager [phone]

## 2021-09-22 NOTE — H&P (Addendum)
NEUROLOGY CONSULTATION NOTE   Date of service: September 22, 2021 Patient Name: Roy [name] MRN:  [id number] DOB:  Jan 10, 1996  _ _ _   _ __   _ __ _ _  __ __   _ __   __ _  History of Present Illness  Roy [name] is a 26 y.o. 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  He came to the ED where he had CTH which demosntrated acute/subacute 2.5cm R occipital lobe hemorrhage. CTA with stable patent stent in left vertebral artery.  He denies any other complaints at this time. Has been taking aspirin and Brilinta. No fall, no trauma.  LKW: unclear, symptoms only lasted a minute, but happened around 2200 on 09/21/20. ICH score: 0 mRS: 0 tNKASe/thrombectomy: not offered 2/2 R occipital ICH NIHSS components Score: Comment  1a Level of Conscious 0[x]  1[]  2[]  3[]      1b LOC Questions 0[x]  1[]  2[]       1c LOC Commands 0[x]  1[]  2[]       2 Best Gaze 0[x]  1[]  2[]       3 Visual 0[x]  1[]  2[]  3[]      4 Facial Palsy 0[x]  1[]  2[]  3[]      5a Motor Arm - left 0[x]  1[]  2[]  3[]  4[]  UN[]    5b Motor Arm - Right 0[x]  1[]  2[]  3[]  4[]  UN[]    6a Motor Leg - Left 0[x]  1[]  2[]  3[]  4[]  UN[]    6b Motor Leg - Right 0[x]  1[]  2[]  3[]  4[]  UN[]    7 Limb Ataxia 0[x]  1[]  2[]  3[]  UN[]     8 Sensory 0[]  1[x]  2[]  UN[]      9 Best Language 0[x]  1[]  2[]  3[]      10 Dysarthria 0[]  1[x]  2[]  UN[]      11 Extinct. and Inattention 0[x]  1[]  2[]       TOTAL: 2       ROS   Constitutional Denies weight loss, fever and chills.   HEENT Denies changes in vision and hearing.   Respiratory Denies SOB and cough.   CV Denies palpitations and CP   GI Denies abdominal pain, nausea, vomiting and diarrhea.   GU Denies dysuria and  urinary frequency.   MSK Denies myalgia and joint pain.   Skin Denies rash and pruritus.   Neurological Denies headache and syncope.   Psychiatric Denies recent changes in mood. Denies anxiety and depression.    Past History   Past Medical History:  Diagnosis Date   CVA (cerebral vascular accident) [facility])    Past Surgical History:  Procedure Laterality Date   IR ANGIO EXTRACRAN SEL COM CAROTID INNOMINATE UNI R MOD SED  07/27/2021   IR CT HEAD LTD  07/27/2021   IR INTRA CRAN STENT  07/27/2021   IR PERCUTANEOUS ART THROMBECTOMY/INFUSION INTRACRANIAL INC DIAG ANGIO  07/27/2021   RADIOLOGY WITH ANESTHESIA N/A 07/27/2021   Procedure: IR WITH ANESTHESIA;  Surgeon: Luanne Bras, MD;  Location: [place];  Service: Radiology;  Laterality: N/A;   No family history on file. Social History   Socioeconomic History   Marital status: Single    Spouse name: Not on file   Number of children: Not on file   Years of education: Not on  file   Highest education level: Not on file  Occupational History   Not on file  Tobacco Use   Smoking status: Never   Smokeless tobacco: Never  Vaping Use   Vaping Use: Never used  Substance and Sexual Activity   Alcohol use: Not Currently   Drug use: Not Currently   Sexual activity: Not Currently  Other Topics Concern   Not on file  Social History Narrative   Not on file   Social Determinants of Health   Financial Resource Strain: Not on file  Food Insecurity: Not on file  Transportation Needs: Not on file  Physical Activity: Not on file  Stress: Not on file  Social Connections: Not on file   No Known Allergies  Medications  (Not in a hospital admission)    Vitals   Vitals:   09/22/21 0117 09/22/21 0118 09/22/21 0215 09/22/21 0245  BP: 130/77  112/71 111/82  Pulse: 77 75 69 71  Resp: 19  18 19   Temp:      TempSrc:      SpO2: 99% 99% 99% 100%  Weight:      Height:         Body mass index is 39.52 kg/m.  Physical Exam   General:  Laying comfortably in bed; in no acute distress.  HENT: Normal oropharynx and mucosa. Normal external appearance of ears and nose.  Neck: Supple, no pain or tenderness  CV: No JVD. No peripheral edema.  Pulmonary: Symmetric Chest rise. Normal respiratory effort.  Abdomen: Soft to touch, non-tender.  Ext: No cyanosis, edema, or deformity  Skin: No rash. Normal palpation of skin.   Musculoskeletal: Normal digits and nails by inspection. No clubbing.   Neurologic Examination  Mental status/Cognition: Alert, oriented to self, place, month and year, good attention.  Speech/language: Mildly dysarthric speech, fluent, comprehension intact, object naming intact, repetition intact.  Cranial nerves:   CN II Pupils equal and reactive to light, no VF deficits    CN III,IV,VI EOM intact, no gaze preference or deviation, nystagmus with horizontal gaze.   CN V normal sensation in V1, V2, and V3 segments bilaterally   CN VII no asymmetry, no nasolabial fold flattening   CN VIII normal hearing to speech   CN IX & X normal palatal elevation, no uvular deviation   CN XI 5/5 head turn and 5/5 shoulder shrug bilaterally   CN XII midline tongue protrusion   Motor:  Muscle bulk: normal, tone normal, pronator drift none tremor none Mvmt Root Nerve  Muscle Right Left Comments  SA C5/6 Ax Deltoid 5 5   EF C5/6 Mc Biceps 5 5   EE C6/7/8 Rad Triceps 5 5   WF C6/7 Med FCR     WE C7/8 PIN ECU     F Ab C8/T1 U ADM/FDI 4+ 4   HF L1/2/3 Fem Illopsoas 5 4+   KE L2/3/4 Fem Quad 5 5   DF L4/5 D Peron Tib Ant 5 5   PF S1/2 Tibial Grc/Sol 5 5    Reflexes:  Right Left Comments  Pectoralis      Biceps (C5/6) 2+ 2+   Brachioradialis (C5/6) 2+ 2+    Triceps (C6/7) 2+ 2+    Patellar (L3/4) 3 3    Achilles (S1) 4 4 Sustained clonus BL   Hoffman      Plantar withdraws withdraws   Jaw jerk    Sensation:  Light touch Decreaed to light touch in Left face, L  arm and L leg.   Pin prick    Temperature    Vibration    Proprioception    Coordination/Complex Motor:  - Finger to Nose intact BL - Heel to shin with ataxia BL, left worse than right. - Rapid alternating movement are slowed BL - Gait: Deferred for patient safety given his R occipital ICH  Labs   CBC:  Recent Labs  Lab 09/21/21 2241  WBC 10.5  NEUTROABS 7.6  HGB 12.7*  HCT 37.6*  MCV 90.6  PLT 403*    Basic Metabolic Panel:  Lab Results  Component Value Date   NA 139 09/21/2021   K 3.3 (L) 09/21/2021   CO2 25 09/21/2021   GLUCOSE 90 09/21/2021   BUN 7 09/21/2021   CREATININE 0.98 09/21/2021   CALCIUM 9.0 09/21/2021   GFRNONAA >60 09/21/2021   Lipid Panel:  Lab Results  Component Value Date   LDLCALC 116 (H) 07/28/2021   HgbA1c:  Lab Results  Component Value Date   HGBA1C 5.7 (H) 07/27/2021   Urine Drug Screen:     Component Value Date/Time   LABOPIA NONE DETECTED 07/27/2021 0606   COCAINSCRNUR NONE DETECTED 07/27/2021 0606   LABBENZ NONE DETECTED 07/27/2021 0606   AMPHETMU NONE DETECTED 07/27/2021 0606   THCU POSITIVE (A) 07/27/2021 0606   LABBARB NONE DETECTED 07/27/2021 0606    Alcohol Level     Component Value Date/Time   ETH <10 07/27/2021 0031    CT angio Head and Neck with and without contrast(Personally reviewed):  Negative CTA for large vessel occlusion.  Interval stenting at the proximal left V1 segment at site of previously identified dissection. Grossly patent flow through the stent. No visible residual dissection by CT.  Continued interval evolution of previously identified posterior circulation infarcts, with new 2.3 cm focus of hemorrhage at the level of the right occipital lobe. No significant regional mass effect.   MRI Brain(Personally reviewed): 1. Normal expected interval evolution of previously identified scattered posterior circulation infarcts. 2. Susceptibility artifact at the level of the evolving right occipital infarct appears to reflect a small focus of acute to  subacute hemorrhage, better seen on corresponding CTA performed on the same day.  Impression   Roy [name] is a 26 y.o. 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 Workup with CTH, CTA and MRI with a 2.5cm subacute R occipital lobe Intraparenchymal hemorrhage which is likely incidental and would not explain his 1 min episode of vertigo with nausea.  Primary Diagnosis:   R occipital Intracerebral Hemorrhage with no intraventricular extension, likely hemorrhagic transformation of his prior R occipital stroke.  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 - Stroke team to  follow  HTN: - hold home meds for now - Will use Cleviprex or Labetalol as needed.  HLD: - Continue home Atorvastatin 40mg  daily.  Patient is full code, would like his girlfriend Ms. [name] to make health care decision on his behalf if he is unable to make decisions by himself. ______________________________________________________________  This patient is critically ill and at significant risk of neurological worsening, death and care requires constant  monitoring of vital signs, hemodynamics,respiratory and cardiac monitoring, neurological assessment, discussion with family, other specialists and medical decision making of high complexity. I spent 35 minutes of neurocritical care time  in the care of  this patient. This was time spent independent of any time provided by nurse practitioner or PA.  Donnetta Simpers [facility] Pager Number [phone] 09/22/2021  3:42 AM  Thank you for the opportunity to take part in the care of this patient. If you have any further questions, please contact the neurology consultation attending.  Signed,  [place] Pager Number [phone] _ _ _   _ __   _ __ _ _  __ __   _ __   __ _

## 2021-09-28 ENCOUNTER — Encounter (HOSPITAL_COMMUNITY): Payer: Self-pay

## 2021-09-28 ENCOUNTER — Other Ambulatory Visit: Payer: Self-pay

## 2021-09-28 ENCOUNTER — Emergency Department (HOSPITAL_COMMUNITY)
Admission: EM | Admit: 2021-09-28 | Discharge: 2021-09-30 | Disposition: A | Discharge status: Home / Self Care | Payer: Medicaid Other | Attending: Emergency Medicine | Admitting: Emergency Medicine

## 2021-09-28 DIAGNOSIS — Z20822 Contact with and (suspected) exposure to covid-19: Secondary | ICD-10-CM | POA: Diagnosis not present

## 2021-09-28 DIAGNOSIS — R479 Unspecified speech disturbances: Secondary | ICD-10-CM | POA: Diagnosis not present

## 2021-09-28 DIAGNOSIS — Z7982 Long term (current) use of aspirin: Secondary | ICD-10-CM | POA: Diagnosis not present

## 2021-09-28 DIAGNOSIS — R45851 Suicidal ideations: Secondary | ICD-10-CM | POA: Diagnosis not present

## 2021-09-28 DIAGNOSIS — F329 Major depressive disorder, single episode, unspecified: Secondary | ICD-10-CM | POA: Diagnosis present

## 2021-09-28 DIAGNOSIS — F331 Major depressive disorder, recurrent, moderate: Secondary | ICD-10-CM

## 2021-09-28 LAB — CBC WITH DIFFERENTIAL/PLATELET
Abs Immature Granulocytes: 0.02 10*3/uL (ref 0.00–0.07)
Basophils Absolute: 0.1 10*3/uL (ref 0.0–0.1)
Basophils Relative: 1 %
Eosinophils Absolute: 0.2 10*3/uL (ref 0.0–0.5)
Eosinophils Relative: 3 %
HCT: 38.8 % — ABNORMAL LOW (ref 39.0–52.0)
Hemoglobin: 13.3 g/dL (ref 13.0–17.0)
Immature Granulocytes: 0 %
Lymphocytes Relative: 30 %
Lymphs Abs: 2.5 10*3/uL (ref 0.7–4.0)
MCH: 30.9 pg (ref 26.0–34.0)
MCHC: 34.3 g/dL (ref 30.0–36.0)
MCV: 90.2 fL (ref 80.0–100.0)
Monocytes Absolute: 0.7 10*3/uL (ref 0.1–1.0)
Monocytes Relative: 8 %
Neutro Abs: 5 10*3/uL (ref 1.7–7.7)
Neutrophils Relative %: 58 %
Platelets: 446 10*3/uL — ABNORMAL HIGH (ref 150–400)
RBC: 4.3 MIL/uL (ref 4.22–5.81)
RDW: 12.9 % (ref 11.5–15.5)
WBC: 8.5 10*3/uL (ref 4.0–10.5)
nRBC: 0 % (ref 0.0–0.2)

## 2021-09-28 LAB — RESP PANEL BY RT-PCR (FLU A&B, COVID) ARPGX2
Influenza A by PCR: NEGATIVE
Influenza B by PCR: NEGATIVE
SARS Coronavirus 2 by RT PCR: NEGATIVE

## 2021-09-28 LAB — BASIC METABOLIC PANEL
Anion gap: 6 (ref 5–15)
BUN: 10 mg/dL (ref 6–20)
CO2: 23 mmol/L (ref 22–32)
Calcium: 9 mg/dL (ref 8.9–10.3)
Chloride: 106 mmol/L (ref 98–111)
Creatinine, Ser: 0.67 mg/dL (ref 0.61–1.24)
GFR, Estimated: >60 mL/min (ref >60)
Glucose, Bld: 85 mg/dL (ref 70–99)
Potassium: 3.5 mmol/L (ref 3.5–5.1)
Sodium: 135 mmol/L (ref 135–145)

## 2021-09-28 LAB — ETHANOL: Alcohol, Ethyl (B): <10 mg/dL (ref <10)

## 2021-09-28 MED ORDER — QUETIAPINE FUMARATE 50 MG PO TABS
25.0000 mg | ORAL_TABLET | Freq: Every evening | ORAL | Status: DC | PRN
  Administered 2021-09-29 (×2): 25 mg via ORAL
  Filled 2021-09-28 (×2): qty 1

## 2021-09-28 MED ORDER — MELATONIN 5 MG PO TABS
5.0000 mg | ORAL_TABLET | Freq: Every day | ORAL | Status: DC
  Administered 2021-09-29 (×2): 5 mg via ORAL
  Filled 2021-09-28 (×2): qty 1

## 2021-09-28 MED ORDER — ASPIRIN 81 MG PO CHEW
81.0000 mg | CHEWABLE_TABLET | Freq: Every day | ORAL | Status: DC
  Administered 2021-09-29 – 2021-09-30 (×2): 81 mg via ORAL
  Filled 2021-09-28 (×2): qty 1

## 2021-09-28 MED ORDER — ATORVASTATIN CALCIUM 40 MG PO TABS
40.0000 mg | ORAL_TABLET | Freq: Every day | ORAL | Status: DC
  Administered 2021-09-29 (×2): 40 mg via ORAL
  Filled 2021-09-28 (×2): qty 1

## 2021-09-28 MED ORDER — SERTRALINE HCL 50 MG PO TABS
100.0000 mg | ORAL_TABLET | Freq: Every day | ORAL | Status: DC
  Administered 2021-09-29 – 2021-09-30 (×2): 100 mg via ORAL
  Filled 2021-09-28 (×2): qty 2

## 2021-09-28 NOTE — ED Provider Notes (Addendum)
[facility] DEPT Provider Note   CSN: [id number] Arrival date & time: 09/28/21  1935     History  Chief Complaint  Patient presents with   Suicidal    Roy [name] is a 26 y.o. male.  Patient is a 26 year old male who presents with suicidal ideations.  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  He was noted to be in status epilepticus.  He was discharged to a rehab.  He was then readmitted about a week ago for dizziness in which time he had a CT scan which found he had hemorrhagic conversion of his stroke.  He was admitted for period of observation discharge.  He complains now of suicidal ideations.  He says that he does not see any point in living and feels like the world would be better without him minute.  He was noted to be banging his head on the bed rail by his family.  He does not report any headaches.  No dizziness currently.  He has some chronic speech deficits but says it is at baseline.  He walks with a walker but says he does not have any change and actually his symptoms have been improving.  He denies any alcohol or drug use.      Home Medications Prior to Admission medications   Medication Sig Start Date End Date Taking? Authorizing Provider  acetaminophen (TYLENOL) 325 MG tablet Take 2 tablets (650 mg total) by mouth every 4 (four) hours as needed for mild pain (or temp > 37.5 C (99.5 F)). 09/09/21   Angiulli, Lavon Paganini, PA-C  aspirin 81 MG chewable tablet Chew 1 tablet (81 mg total) by mouth daily. Patient not taking: Reported on 09/22/2021 08/06/21   Dennison Mascot, PA-C  atorvastatin (LIPITOR) 40 MG tablet Take 1 tablet (40 mg total) by mouth at bedtime. 09/09/21   Angiulli, Lavon Paganini, PA-C  folic acid (FOLVITE) 1 MG tablet Take 1 tablet (1 mg total) by mouth daily. 09/09/21   Angiulli, Lavon Paganini, PA-C  melatonin 5 MG TABS Take 1 tablet (5 mg total) by mouth at bedtime. 09/09/21    Angiulli, Lavon Paganini, PA-C  Multiple Vitamin (MULTIVITAMIN WITH MINERALS) TABS tablet Take 1 tablet by mouth daily. Patient not taking: Reported on 09/22/2021 08/06/21   Dennison Mascot, PA-C  polyethylene glycol (MIRALAX / GLYCOLAX) 17 g packet Take 17 g by mouth 2 (two) times daily. Patient not taking: Reported on 09/22/2021 09/09/21   Angiulli, Lavon Paganini, PA-C  QUEtiapine (SEROQUEL) 25 MG tablet Take 1 tablet (25 mg total) by mouth at bedtime as needed (insomnia). 09/09/21   Angiulli, Lavon Paganini, PA-C  sertraline (ZOLOFT) 100 MG tablet Take 1 tablet (100 mg total) by mouth daily. 09/09/21   Angiulli, Lavon Paganini, PA-C      Allergies    Patient has no known allergies.    Review of Systems   Review of Systems  Constitutional:  Negative for chills, diaphoresis, fatigue and fever.  HENT:  Negative for congestion, rhinorrhea and sneezing.   Eyes: Negative.   Respiratory:  Negative for cough, chest tightness and shortness of breath.   Cardiovascular:  Negative for chest pain and leg swelling.  Gastrointestinal:  Negative for abdominal pain, blood in stool, diarrhea, nausea and vomiting.  Genitourinary:  Negative for difficulty urinating, flank pain, frequency and hematuria.  Musculoskeletal:  Negative for arthralgias and back pain.  Skin:  Negative for rash.  Neurological:  Positive  for speech difficulty (At baseline). Negative for dizziness, weakness, numbness and headaches.  Psychiatric/Behavioral:  Positive for suicidal ideas.    Physical Exam Updated Vital Signs BP (!) 128/100    Pulse 97    Resp 19    SpO2 99%  Physical Exam Constitutional:      Appearance: He is well-developed.  HENT:     Head: Normocephalic and atraumatic.  Eyes:     Pupils: Pupils are equal, round, and reactive to light.  Cardiovascular:     Rate and Rhythm: Normal rate and regular rhythm.     Heart sounds: Normal heart sounds.  Pulmonary:     Effort: Pulmonary effort is normal. No respiratory distress.     Breath  sounds: Normal breath sounds. No wheezing or rales.  Chest:     Chest wall: No tenderness.  Abdominal:     General: Bowel sounds are normal.     Palpations: Abdomen is soft.     Tenderness: There is no abdominal tenderness. There is no guarding or rebound.  Musculoskeletal:        General: Normal range of motion.     Cervical back: Normal range of motion and neck supple.  Lymphadenopathy:     Cervical: No cervical adenopathy.  Skin:    General: Skin is warm and dry.     Findings: No rash.  Neurological:     Mental Status: He is alert and oriented to person, place, and time.     Comments: Patient has some difficulty with his speech but says this is baseline for him.  Motor 5 out of 5 all extremities, sensation grossly intact to light touch all extremities, no pronator drift    ED Results / Procedures / Treatments   Labs (all labs ordered are listed, but only abnormal results are displayed) Labs Reviewed  CBC WITH DIFFERENTIAL/PLATELET - Abnormal; Notable for the following components:      Result Value   HCT 38.8 (*)    Platelets 446 (*)    All other components within normal limits  RESP PANEL BY RT-PCR (FLU A&B, COVID) ARPGX2  BASIC METABOLIC PANEL  ETHANOL  RAPID URINE DRUG SCREEN, HOSP PERFORMED    EKG EKG Interpretation  Date/Time:  Tuesday September 28 2021 20:01:26 EST Ventricular Rate:  79 PR Interval:  152 QRS Duration: 83 QT Interval:  399 QTC Calculation: 458 R Axis:   41 Text Interpretation: Sinus rhythm Anteroseptal infarct, old Confirmed by Malvin Johns [id number]) on 09/28/2021 11:00:44 PM  Radiology No results found.  Procedures Procedures   Medications Ordered in ED Medications  aspirin chewable tablet 81 mg (has no administration in time range)  atorvastatin (LIPITOR) tablet 40 mg (has no administration in time range)  melatonin tablet 5 mg (has no administration in time range)  QUEtiapine (SEROQUEL) tablet 25 mg (has no administration in time  range)  sertraline (ZOLOFT) tablet 100 mg (has no administration in time range)    ED Course/ Medical Decision Making/ A&P Clinical Course as of 09/28/21 2318  Tue Sep 28, 2021  2317 Temp checked 98.3 [MB]    Clinical Course User Index [MB] Malvin Johns, MD                           Medical Decision Making  Patient is a 26 year old who presents with suicidal ideations.  He had a recent embolic stroke due to vertebral dissection and November.  There was some hemorrhagic transformation that  he was admitted for about a week ago.  He he says that his symptoms have resolved.  He does not complain of any new headaches, dizziness, other stroke symptoms.  His labs are reviewed by me and are nonconcerning.  He is medically cleared and awaiting TTS evaluation.  Final Clinical Impression(s) / ED Diagnoses Final diagnoses:  Suicidal ideation    Rx / DC Orders ED Discharge Orders     None         Malvin Johns, MD 09/28/21 [id number]    Malvin Johns, MD 09/28/21 2318

## 2021-09-28 NOTE — ED Triage Notes (Signed)
Pt. BIB [facility] c/o SI. Family reports that pt has been throwing himself out of bed, banging his head on the bed rail and stating that he didn't want to live like this. He has hx of recent stroke and seizure that he was seen for about 2 months ago. Since then he has experienced depression.  EMS VS:  BP:140/90 HR:86 O2: 98%

## 2021-09-28 NOTE — ED Notes (Signed)
Patients sister would like a call back with an update: [phone] [place]

## 2021-09-29 LAB — RAPID URINE DRUG SCREEN, HOSP PERFORMED
Amphetamines: NOT DETECTED
Barbiturates: NOT DETECTED
Benzodiazepines: NOT DETECTED
Cocaine: NOT DETECTED
Opiates: NOT DETECTED
Tetrahydrocannabinol: POSITIVE — AB

## 2021-09-29 LAB — URINALYSIS, ROUTINE W REFLEX MICROSCOPIC
Bilirubin Urine: NEGATIVE
Glucose, UA: NEGATIVE mg/dL
Hgb urine dipstick: NEGATIVE
Ketones, ur: NEGATIVE mg/dL
Leukocytes,Ua: NEGATIVE
Nitrite: NEGATIVE
Protein, ur: NEGATIVE mg/dL
Specific Gravity, Urine: 1.019 (ref 1.005–1.030)
pH: 5 (ref 5.0–8.0)

## 2021-09-29 NOTE — BH Assessment (Addendum)
Comprehensive Clinical Assessment (CCA) Note  09/29/2021 Efram Roath [id number]  Disposition: Lindon Romp, NP, patient meets inpatient criteria. Per Larose Kells, no available beds. Disposition SW to secure placement in the AM. [place], RN, informed of disposition.  The patient demonstrates the following risk factors for suicide: Chronic risk factors for suicide include: psychiatric disorder of depression and medical illness yes . Acute risk factors for suicide include: unemployment, social withdrawal/isolation, and loss (financial, interpersonal, professional). Protective factors for this patient include: positive social support, responsibility to others (children, family), coping skills, and hope for the future. Considering these factors, the overall suicide risk at this point appears to be moderate. Patient is not appropriate for outpatient follow up.  [place] ED from 09/28/2021 in [facility] DEPT ED to Hosp-Admission (Discharged) from 09/21/2021 in [facility] NEURO/TRAUMA/SURGICAL ICU Admission (Discharged) from 08/05/2021 in [facility] High Risk No Risk No Risk      Dermont Quarterman is a 26 year old male presenting voluntary to [facility] due to [place] and worsening depression. Patient reported onset of SI with no plan was today. Patient was brought in by police, after family called and reported patient was throwing himself out of bed, banging his head on the bed rail and stating that he didn't want to live like this. Patient had a stroke and seizure within the past 2 months and did receive medical attention for medical condition. Since then he has experienced depression. Patient reported stressors/triggers include, having no medicaid, no money and no job. Patient stated "I hate it everyday, I don't want to live anymore". Patient stated "I can't even provide for my on self". Patient reported "I want to be some ones  priority, my sister has her kids so she can't prioritize what I have going on and my girlfriend tries to help, but then when she goes to work, I am lonely". Patient denied prior psych hospitalizations, suicide attempts and self-harming behaviors. Patient is not receiving outpatient mental health services. Patient reports being prescribed psych medications and stated "some days they work and some days they don't".   Patient is currently living with sister and her children. Patient reported no family discord. Patient is not employed. Patient denied access to guns. At times during TTS assessment, TTS clinician unable to understand due to chronic speech deficits. Patient was cooperative during assessment.   Chief Complaint:  Chief Complaint  Patient presents with   Suicidal   Visit Diagnosis:  Major depressive disorder   CCA Screening, Triage and Referral (STR)  Patient Reported Information How did you hear about [place]? Family/Friend  What Is the Reason for Your Visit/Call Today? SI and depression.  How Long Has This Been Causing You Problems? No data recorded What Do You Feel Would Help You the Most Today? Treatment for Depression or other mood problem   Have You Recently Had Any Thoughts About Hurting Yourself? Yes  Are You Planning to Commit Suicide/Harm Yourself At This time? No   Have you Recently Had Thoughts About [place]? No data recorded Are You Planning to Harm Someone at This Time? No  Explanation: No data recorded  Have You Used Any Alcohol or Drugs in the Past 24 Hours? No  How Long Ago Did You Use Drugs or Alcohol? No data recorded What Did You Use and How Much? No data recorded  Do You Currently Have a Therapist/Psychiatrist? No  Name of Therapist/Psychiatrist: No data recorded  Have You Been Recently Discharged From Any Office Practice or Programs? No  Explanation of Discharge From Practice/Program: No data recorded    CCA Screening Triage  Referral Assessment Type of Contact: Tele-Assessment  Telemedicine Service Delivery:   Is this Initial or Reassessment? Initial Assessment  Date Telepsych consult ordered in [facility]:  09/28/21  Time Telepsych consult ordered in [facility]:  2033  Location of Assessment: [facility] ED  Provider Location: [facility] Assessment Services   Collateral Involvement: No data recorded  Does Patient Have a Court Appointed Legal Guardian? No data recorded Name and Contact of Legal Guardian: No data recorded If Minor and Not Living with Parent(s), Who has Custody? No data recorded Is CPS involved or ever been involved? Never  Is APS involved or ever been involved? Never   Patient Determined To Be At Risk for Harm To Self or Others Based on Review of Patient Reported Information or Presenting Complaint? No data recorded Method: No data recorded Availability of Means: No data recorded Intent: No data recorded Notification Required: No data recorded Additional Information for Danger to Others Potential: No data recorded Additional Comments for Danger to Others Potential: No data recorded Are There Guns or Other Weapons in Your Home? No data recorded Types of Guns/Weapons: No data recorded Are These Weapons Safely Secured?                            No data recorded Who Could Verify You Are Able To Have These Secured: No data recorded Do You Have any Outstanding Charges, Pending Court Dates, Parole/Probation? No data recorded Contacted To Inform of Risk of Harm To Self or Others: Family/Significant Other:    Does Patient Present under Involuntary Commitment? No data recorded IVC Papers Initial File Date: No data recorded  [place] of Residence: [place]   Patient Currently Receiving the Following Services: Medication Management   Determination of Need: Urgent (48 hours)   Options For Referral: Medication Management; Inpatient Hospitalization; Outpatient Therapy     CCA Biopsychosocial Patient  Reported Schizophrenia/Schizoaffective Diagnosis in Past: No data recorded  Strengths: self-awareness   Mental Health Symptoms Depression:   Worthlessness; Hopelessness; Tearfulness; Change in energy/activity; Increase/decrease in appetite   Duration of Depressive symptoms:  Duration of Depressive Symptoms: Less than two weeks   Mania:   None   Anxiety:    Worrying; Tension; Restlessness; Irritability   Psychosis:   None   Duration of Psychotic symptoms:    Trauma:   None   Obsessions:   None   Compulsions:   None   Inattention:   None   Hyperactivity/Impulsivity:   None   Oppositional/Defiant Behaviors:   None   Emotional Irregularity:   None   Other Mood/Personality Symptoms:  No data recorded   Mental Status Exam Appearance and self-care  Stature:   Average   Weight:   Average weight   Clothing:   Casual   Grooming:   Normal   Cosmetic use:   None   Posture/gait:   Normal   Motor activity:   Not Remarkable   Sensorium  Attention:   Normal   Concentration:   Normal   Orientation:   X5   Recall/memory:   Normal   Affect and Mood  Affect:   Anxious; Appropriate   Mood:   Depressed; Hopeless   Relating  Eye contact:   Normal   Facial expression:   Anxious; Depressed; Sad; Responsive; Tense  Attitude toward examiner:  No data recorded  Thought and Language  Speech flow:  Garbled   Thought content:   Appropriate to Mood and Circumstances   Preoccupation:   None   Hallucinations:   None   Organization:  No data recorded  [employer] of Knowledge:   Average   Intelligence:   Average   Abstraction:   Normal   Judgement:   Dangerous   Reality Testing:   Distorted   Insight:   Fair   Decision Making:   Impulsive; Confused   Social Functioning  Social Maturity:   Impulsive   Social Judgement:   Heedless   Stress  Stressors:   Transitions; Housing; Teacher, music  Ability:   Exhausted; Overwhelmed   Skill Deficits:   Decision making; Self-control   Supports:   Family     Religion: Religion/Spirituality Are 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)  Leisure/Recreation: Leisure / Recreation Do You Have Hobbies?: Yes Leisure and Hobbies: baskeball, video games and watching tv  Exercise/Diet: Exercise/Diet Do You Exercise?:  (uta) Do You Follow a Special Diet?:  (uta) Do You Have Any Trouble Sleeping?: No   CCA Employment/Education Employment/Work Situation: Employment / Work Technical sales engineer: On disability Why is Patient on Disability: uta  Education: Education Is Patient Currently Attending School?: No Last Grade Completed:  [employer]) Did You Attend College?:  (uta) Did You Have An Individualized Education Program (IIEP):  [employer]) Did You Have Any Difficulty At School?:  [employer]) Patient's Education Has Been Impacted by Current Illness:  (uta)   CCA Family/Childhood History Family and Relationship History: Family history Marital status: Single Does patient have children?: No  Childhood History:  Childhood History Did patient suffer any verbal/emotional/physical/sexual abuse as a child?: No Did patient suffer from severe childhood neglect?: No Has patient ever been sexually abused/assaulted/raped as an adolescent or adult?: No Was the patient ever a victim of a crime or a disaster?: No Witnessed domestic violence?: No  Child/Adolescent Assessment:     CCA Substance Use Alcohol/Drug Use: Alcohol / Drug Use Pain Medications: see MAR Prescriptions: see MAR Over the Counter: see MAR History of alcohol / drug use?: No history of alcohol / drug abuse                         ASAM's:  Six Dimensions of Multidimensional Assessment  Dimension 1:  Acute Intoxication and/or Withdrawal Potential:      Dimension 2:  Biomedical Conditions and Complications:      Dimension 3:  Emotional, Behavioral, or Cognitive  Conditions and Complications:     Dimension 4:  Readiness to Change:     Dimension 5:  Relapse, Continued use, or Continued Problem Potential:     Dimension 6:  Recovery/Living Environment:     ASAM Severity Score:    ASAM Recommended Level of Treatment:     Substance use Disorder (SUD)    Recommendations for Services/Supports/Treatments: Recommendations for Services/Supports/Treatments Recommendations For Services/Supports/Treatments: Individual Therapy, Inpatient Hospitalization, Medication Management  Discharge Disposition:    DSM5 Diagnoses: Patient Active Problem List   Diagnosis Date Noted   ICH (intracerebral hemorrhage) ([place]) 09/22/2021   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/22/2021   Cognitive deficits    Sleep disturbance    Slow transit constipation    Hyponatremia    Vascular headache    Leukocytosis    Transaminitis    Dyslipidemia    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

## 2021-09-29 NOTE — Consult Note (Signed)
°  Patient is a 26 year old male who presents with suicidal ideations.  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  In which she ultimately recover and was discharged to rehab, he has some speech deficits although it is comprehensible and linear no aphasia noted.  He is able to ambulate with a walker independently.  Patient seen and reassessed by this nurse practitioner.  Patient was also recently seen and assessed by a therapeutic triage service in which a comprehensive clinical assessment was completed with disposition for inpatient.  Reassessment today determines patient continues to meet inpatient psychiatric criteria as he continues to endorse active suicidal ideations with no plan.  Patient continues to endorse multiple depressive symptoms to include hopelessness, anhedonia, guilty, worthless, anxiety, and suicidal thoughts.  Patient is also high risk for suicide completion due to multiple risk factors. -Patient was previously on Zoloft 50 mg, has been increased his sertraline 100 mg p.o. daily.  First dose received today.  Patient also has as needed order for quetiapine 25 mg as needed for insomnia. -Home medications have also been resumed which include atorvastatin 40 mg, aspirin 81 mg, and melatonin 5 mg p.o. nightly. -Patient continues to meet inpatient psychiatric criteria at this time.  If no appropriate beds are available at call behavioral health Hospital will need to refer patient out of system.

## 2021-09-29 NOTE — ED Provider Notes (Signed)
Emergency Medicine Observation Re-evaluation Note  Roy [name] is a 26 y.o. male, seen on rounds today at 0700.  Pt initially presented to the ED for complaints of Suicidal Currently, the patient is resting comfortably.  Physical Exam  BP 132/71    Pulse 76    Temp 98 F (36.7 C)    Resp 16    SpO2 98%  Physical Exam General: NAD   ED Course / MDM  EKG:EKG Interpretation  Date/Time:  Tuesday September 28 2021 20:01:26 EST Ventricular Rate:  79 PR Interval:  152 QRS Duration: 83 QT Interval:  399 QTC Calculation: 458 R Axis:   41 Text Interpretation: Sinus rhythm Anteroseptal infarct, old Confirmed by Rolan Bucco [phone]) on 09/28/2021 11:00:44 PM  I have reviewed the labs performed to date as well as medications administered while in observation.  Recent changes in the last 24 hours include no acute events reported.  Plan  Current plan is for placement.  Roy [name] is not under involuntary commitment.     Wynetta Fines, MD 09/29/21 [phone]

## 2021-09-29 NOTE — ED Notes (Signed)
Fiancee Molly Maduro) called and ask about patient. She was told patient was alright and talking. She was also told he had a sitter because she is concerned no one was watching or caring for him. Patient notified she called to check in on him.

## 2021-09-29 NOTE — ED Notes (Signed)
Pt given morning meds. Pt reports feeling "tired" this morning. Pt denies pain at this time.  Up in bed eating breakfast and watching cartoons.

## 2021-09-29 NOTE — BH Assessment (Addendum)
[facility] Assessment Progress Note     Per Sheran Fava, NP, this voluntary pt requires psychiatric hospitalization at a facility providing med-psych services at this time.  At this time he does not meet criteria for IVC.  The following facilities have been contacted to seek placement for this pt, with results as noted:   Beds available, information sent, decision pending: [facility]  If this voluntary pt is accepted to a facility, please discuss disposition with pt to be sure that she agrees to the plan.  If a facility agrees to accept pt and the plan changes in any way please call the facility to inform them of the change.  Final disposition is pending as of this writing.   Jalene Mullet, [place] Coordinator [phone]

## 2021-09-29 NOTE — Progress Notes (Signed)
Inpatient Behavioral Health Placement  Meets inpatient criteria per Caryn Bee, NP. There are no appropriate beds at [facility] Referral was sent to the following facilities;   Destination Service Provider Address Phone [facility]  [address] [place], [place] [address] [phone]  [facility]  [address] [place] [address] [phone]  [facility]  [address] [place] [address] [phone]  [facility] [address]  [address], [place] [address] [phone]  [facility]  [address], [place] [address] [phone]  [facility]  [address] [place], [place] [address] [phone]  [facility] [address] [place] [address] [phone]  [facility]  [address], [place] [address] [phone]  [facility]  [address] [place], [place] [address] [phone]  [facility]  [address], [place] [address] [phone]  [facility]  [address] [place] [address] [phone]  [facility]  [address], [place] [address] [phone]  [facility]  [address], [place] [address] [phone]  [facility]  [address] [place] [address] [phone]  [facility]  [address] [place] [address] [phone]  [facility]  [address] [place] [address] [phone]  [facility]  [address] [place], [place] [address] [phone]   [facility]  [address], [place] [address] [phone]  [facility]  [address] [place], [place] [address] [phone]  [facility]  [address] [place] [address] [phone]     Situation ongoing,  CSW will follow up.   Maryjean Ka, MSW, [facility] 09/29/2021  @ 10:34 PM

## 2021-09-30 ENCOUNTER — Ambulatory Visit: Payer: Self-pay | Attending: Internal Medicine | Admitting: Internal Medicine

## 2021-09-30 DIAGNOSIS — Z538 Procedure and treatment not carried out for other reasons: Secondary | ICD-10-CM

## 2021-09-30 DIAGNOSIS — F331 Major depressive disorder, recurrent, moderate: Secondary | ICD-10-CM

## 2021-09-30 MED ORDER — ALUM & MAG HYDROXIDE-SIMETH 200-200-20 MG/5ML PO SUSP
30.0000 mL | Freq: Once | ORAL | Status: AC
  Administered 2021-09-30: 30 mL via ORAL
  Filled 2021-09-30: qty 30

## 2021-09-30 NOTE — Progress Notes (Signed)
.  Transition of Care [facility]) - Emergency Department Mini Assessment   Patient Details  Name: Roy [name] MRN: [id number] Date of Birth: 03-13-1996  [facility]) CM/SW Contact:    Illene Regulus, LCSW Phone Number: 09/30/2021, 2:25 PM   Clinical Narrative: TOC CSW spoke with pt's sister [name] , she stated she will cal her aunt to assist with picking pt up from the ED upon d/c.TOC sign off.   ED Mini Assessment: What brought you to the Emergency Department? : SI psych Eval  Barriers to Discharge: No Barriers Identified     Means of departure: Car  Interventions which prevented an admission or readmission: Transportation Screening    Patient Contact and Communications        ,                 Admission diagnosis:  SI  Patient Active Problem List   Diagnosis Date Noted   MDD (major depressive disorder), recurrent episode, moderate ([place]) 09/30/2021   ICH (intracerebral hemorrhage) ([place]) 09/22/2021   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/22/2021   Cognitive deficits    Sleep disturbance    Slow transit constipation    Hyponatremia    Vascular headache    Leukocytosis    Transaminitis    Dyslipidemia    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 [place] [address] Phone: [phone] Fax: [phone]

## 2021-09-30 NOTE — ED Provider Notes (Signed)
Emergency Medicine Observation Re-evaluation Note  Roy [name] is a 26 y.o. male, seen on rounds today at 0700.  Pt initially presented to the ED for complaints of Suicidal Currently, the patient is resting comfortably.  Physical Exam  BP 130/68    Pulse 78    Temp 98 F (36.7 C)    Resp 18    SpO2 99%  Physical Exam General: NAD  ED Course / MDM  EKG:EKG Interpretation  Date/Time:  Tuesday September 28 2021 20:01:26 EST Ventricular Rate:  79 PR Interval:  152 QRS Duration: 83 QT Interval:  399 QTC Calculation: 458 R Axis:   41 Text Interpretation: Sinus rhythm Anteroseptal infarct, old Confirmed by Rolan Bucco [phone]) on 09/28/2021 11:00:44 PM  I have reviewed the labs performed to date as well as medications administered while in observation.  Recent changes in the last 24 hours include no acute events reported.  Plan  Current plan is for placement.  Roy [name] is not under involuntary commitment.     Wynetta Fines, MD 09/30/21 [phone]

## 2021-09-30 NOTE — BH Assessment (Signed)
[facility] Assessment Progress Note   Per Caryn Bee, NP on 09/29/2021 this voluntary pt requires psychiatric hospitalization at a facility providing med-psych services.  Facilities offering such services in this area are limited.  Yesterday this Clinical research associate referred pt to several such facilities.  Today I have made follow up calls with results noted below:  At 09:59 I called [facility] and spoke to [facility].  She reports that their system is current stressed by [place] quarantine.  Nonetheless, she reports that they have received the referral and that pt is under review with final decision pending.  I made sure that she has my phone number for follow up.  Decision is pending as of this writing.  At 10:01 I called [facility].  Call rolled to voice mail, and I left a message inquiring whether they have received the referral and asking if pt has been staffed with their provider.  Return call is pending as of this writing.  At 10:04 I called [facility].  Clinician reports that she is currently seeing patients in their Emergency Department, but she agrees to check on the status of the referral when she has time.  Return call is pending as of this writing.  Doylene Canning, [place] Behavioral Health Coordinator [phone]

## 2021-09-30 NOTE — Discharge Instructions (Addendum)
For your behavioral health needs, you are advised to follow up with the Partial Hospitalization Program (PHP) offered by [facility].  This program meets Monday - Friday from 9:00 am - 1:00 pm.  Due to Covid-19 this program is currently virtual.  To participate, you will need to have Internet connectivity, an e-mail address and a device that you can use for virtual programming.  If you have any questions or to schedule a virtual intake appointment, contact Milana Na, [facility] at the phone number indicated below:       [facility]      [address]      [place], [place] [address]      Contact person: Milana Na, [facility]      [phone]

## 2021-09-30 NOTE — Progress Notes (Signed)
Patient had telephone visit scheduled with me today for 9:30 AM.  I was told by his fiance Ms. Steffanie Dunn that he is currently at the hospital awaiting psychiatric admission.  She request that his appointment be rescheduled.  I do see that the patient has been in the ER since 09/28/2021 awaiting placement for suicidal ideation.  I will have his appointment rescheduled.

## 2021-09-30 NOTE — BH Assessment (Signed)
[facility] Assessment Progress Note   Per Caryn Bee, NP, this voluntary pt does not require psychiatric hospitalization at this time.  Pt is psychiatrically cleared.  Pt is recommended for the Partial Hospitalization Program (PHP) offered by [facility].  Due to Covid-19 this program is currently virtual.  This Clinical research associate spoke to pt about the program.  He is interested and reports that he has most of the IT support necessary for virtual programming, but he does not currently have an e-mail address.  I instructed pt to sign up for one as soon as possible, then to contact Milana Na, [facility], whose contact information I have included in pt's discharge instructions, to which he agrees.  EDP Kristine Royal, MD and pt's nurse, Chrissie Noa, have been notified.  Doylene Canning, MA Triage Specialist [phone]

## 2021-09-30 NOTE — Consult Note (Signed)
[facility] Psych ED Discharge  09/30/2021 12:23 PM Akhil Piscopo  MRN:  [id number]  Method of visit?: Face to Face   Principal Problem: MDD (major depressive disorder), recurrent episode, moderate (HCC) Discharge Diagnoses: Principal Problem:   MDD (major depressive disorder), recurrent episode, moderate (HCC)   Subjective: Imaad Reuss is a  26 year old male with a history of major depressive disorder, cognitive deficits, history of a cerebral embolism and cerebral thrombosis who presented to the ED for evaluation of depression and suicidal thoughts.  On evaluation today patient appears to be at his baseline after overnight observation.  Patient currently denies suicidal ideations at this time.  He is currently not psychotic and he is not currently manic.  He denies thoughts of self-harm or suicide, and remains future oriented.  He is also able to contract for safety, and provide coping skills for suicidal thoughts to include" calm down, take my medicine, and talk to somebody about it, and call 911. "  He does report compliance with his psychotropic medication. He does appear to be stable for discharge at this time and is open to following up with outpatient psychiatric resources.  Patient no longer has any immediate safety concerns, and does not appear to be acutely psychotic at this time.  On evaluation today patient is awake, alert, oriented x4, pleasant, smiling, playing good behavioral control in no acute distress.  In regards to his suicidal thoughts" I am good now.  I feel much better.  I am ready to go home."  He acknowledged that sometimes he does  just do so so he can come into the hospital, as he knows people care about him, and feels like he is not a first priority when he is at home.  He does not appear to be internally preoccupied, delusional, paranoid, manic, or otherwise psychotic. He denies any auditory visual hallucinations, suicidal thoughts, or self harming behaviors. He is  psychiatrically cleared for discharge with outpatient.  Per chart review patient did receive a phone call from his fiance, which he also appeared to be bright and and happy about.  Total Time spent with patient: 30 minutes  Past Psychiatric History: Depression and cognitive deficit.  Upon admission patient was taking Zoloft 50 mg.  No recent inpatient psychiatric admission.  No current outpatient psychiatric services.  No history of suicide attempt, suicidal thoughts, and or nonsuicidal self-injurious behavior.   Past Medical History:  Past Medical History:  Diagnosis Date   CVA (cerebral vascular accident) [facility])     Past Surgical History:  Procedure Laterality Date   IR ANGIO EXTRACRAN SEL COM CAROTID INNOMINATE UNI R MOD SED  07/27/2021   IR CT HEAD LTD  07/27/2021   IR INTRA CRAN STENT  07/27/2021   IR PERCUTANEOUS ART THROMBECTOMY/INFUSION INTRACRANIAL INC DIAG ANGIO  07/27/2021   RADIOLOGY WITH ANESTHESIA N/A 07/27/2021   Procedure: IR WITH ANESTHESIA;  Surgeon: Julieanne Cotton, MD;  Location: [facility] OR;  Service: Radiology;  Laterality: N/A;   Family History: History reviewed. No pertinent family history. Family Psychiatric  History: Denies Social History:  Social History   Substance and Sexual Activity  Alcohol Use Not Currently     Social History   Substance and Sexual Activity  Drug Use Not Currently    Social History   Socioeconomic History   Marital status: Single    Spouse name: Not on file   Number of children: Not on file   Years of education: Not on file   Highest  education level: Not on file  Occupational History   Not on file  Tobacco Use   Smoking status: Never   Smokeless tobacco: Never  Vaping Use   Vaping Use: Never used  Substance and Sexual Activity   Alcohol use: Not Currently   Drug use: Not Currently   Sexual activity: Not Currently  Other Topics Concern   Not on file  Social History Narrative   Not on file   Social Determinants of Health    Financial Resource Strain: Not on file  Food Insecurity: Not on file  Transportation Needs: Not on file  Physical Activity: Not on file  Stress: Not on file  Social Connections: Not on file    Tobacco Cessation:  N/A, patient does not currently use tobacco products  Current Medications: Current Facility-Administered Medications  Medication Dose Route Frequency Provider Last Rate Last Admin   aspirin chewable tablet 81 mg  81 mg Oral Daily Rolan Bucco, MD   81 mg at 09/30/21 0928   atorvastatin (LIPITOR) tablet 40 mg  40 mg Oral QHS Rolan Bucco, MD   40 mg at 09/29/21 2146   melatonin tablet 5 mg  5 mg Oral QHS Rolan Bucco, MD   5 mg at 09/29/21 2147   QUEtiapine (SEROQUEL) tablet 25 mg  25 mg Oral QHS PRN Rolan Bucco, MD   25 mg at 09/29/21 2147   sertraline (ZOLOFT) tablet 100 mg  100 mg Oral Daily Rolan Bucco, MD   100 mg at 09/30/21 6294   Current Outpatient Medications  Medication Sig Dispense Refill   acetaminophen (TYLENOL) 325 MG tablet Take 2 tablets (650 mg total) by mouth every 4 (four) hours as needed for mild pain (or temp > 37.5 C (99.5 F)).     aspirin 81 MG chewable tablet Chew 1 tablet (81 mg total) by mouth daily. (Patient taking differently: Chew 81 mg by mouth at bedtime.) 60 tablet 0   atorvastatin (LIPITOR) 40 MG tablet Take 1 tablet (40 mg total) by mouth at bedtime. 30 tablet 0   folic acid (FOLVITE) 1 MG tablet Take 1 tablet (1 mg total) by mouth daily. 30 tablet 0   melatonin 5 MG TABS Take 1 tablet (5 mg total) by mouth at bedtime. 30 tablet 0   Multiple Vitamin (MULTIVITAMIN WITH MINERALS) TABS tablet Take 1 tablet by mouth daily. 30 tablet 0   polyethylene glycol (MIRALAX / GLYCOLAX) 17 g packet Take 17 g by mouth 2 (two) times daily. (Patient taking differently: Take 17 g by mouth daily as needed for mild constipation.) 14 each 0   QUEtiapine (SEROQUEL) 25 MG tablet Take 1 tablet (25 mg total) by mouth at bedtime as needed (insomnia). 20  tablet 0   sertraline (ZOLOFT) 100 MG tablet Take 1 tablet (100 mg total) by mouth daily. 30 tablet 0   PTA Medications: (Not in a hospital admission)   Musculoskeletal: Strength & Muscle Tone: within normal limits Gait & Station: normal Patient leans: N/A  Psychiatric Specialty Exam:  Presentation  General Appearance: Appropriate for Environment; Casual  Eye Contact:Fair  Speech:Clear and Coherent; Normal Rate  Speech Volume:Normal  Handedness:Right   Mood and Affect  Mood:Euthymic  Affect:Appropriate; Congruent   Thought Process  Thought Processes:Coherent; Linear; Goal Directed  Descriptions of Associations:Intact  Orientation:Full (Time, Place and Person)  Thought Content:Logical  History of Schizophrenia/Schizoaffective disorder:No data recorded Duration of Psychotic Symptoms:No data recorded Hallucinations:Hallucinations: None  Ideas of Reference:None  Suicidal Thoughts:Suicidal Thoughts: No  Homicidal  Thoughts:Homicidal Thoughts: No   Sensorium  Memory:Immediate Good; Recent Good; Remote Good  Judgment:Fair  Insight:Fair   Executive Functions  Concentration:Fair  Attention Span:Fair  Recall:Fair  Fund of Knowledge:Fair  Language:Fair   Psychomotor Activity  Psychomotor Activity:Psychomotor Activity: Normal   Assets  Assets:Desire for Improvement; Manufacturing systems engineerCommunication Skills; Physical Health; Resilience; Social Support; Financial Resources/Insurance   Sleep  Sleep:Sleep: Fair    Physical Exam: Physical Exam Vitals and nursing note reviewed.  Constitutional:      Appearance: Normal appearance. He is normal weight.  Neurological:     General: No focal deficit present.     Mental Status: He is alert and oriented to person, place, and time. Mental status is at baseline.  Psychiatric:        Mood and Affect: Mood normal.        Behavior: Behavior normal.        Thought Content: Thought content normal.        Judgment: Judgment  normal.   Review of Systems  Psychiatric/Behavioral:  Positive for depression and substance abuse. Negative for hallucinations, memory loss and suicidal ideas. The patient is not nervous/anxious and does not have insomnia.   All other systems reviewed and are negative. Blood pressure 130/68, pulse 78, temperature 98 F (36.7 C), resp. rate 18, SpO2 99 %. There is no height or weight on file to calculate BMI.   Demographic Factors:  Male, Adolescent or young adult, Low socioeconomic status, and Unemployed  Loss Factors: Decrease in vocational status, Decline in physical health, and Financial problems/change in socioeconomic status  Historical Factors: Impulsivity  Risk Reduction Factors:   Living with another person, especially a relative, Positive social support, Positive therapeutic relationship, and Positive coping skills or problem solving skills  Continued Clinical Symptoms:  Unstable or Poor Therapeutic Relationship Previous Psychiatric Diagnoses and Treatments Medical Diagnoses and Treatments/Surgeries  Cognitive Features That Contribute To Risk:  None    Suicide Risk:  Minimal: No identifiable suicidal ideation.  Patients presenting with no risk factors but with morbid ruminations; may be classified as minimal risk based on the severity of the depressive symptoms    Plan Of Care/Follow-up recommendations:  Other:  Psych cleared at this time. He denies any suicidal ideations at this time, is able to contract for safety at this time as well.   Disposition: Psych cleared Maryagnes Amosakia S Starkes-Perry, FNP 09/30/2021, 12:23 PM

## 2021-10-18 ENCOUNTER — Ambulatory Visit (HOSPITAL_COMMUNITY): Admission: RE | Admit: 2021-10-18 | Payer: Self-pay | Source: Ambulatory Visit

## 2021-10-22 ENCOUNTER — Ambulatory Visit: Payer: Self-pay | Attending: Internal Medicine | Admitting: Internal Medicine

## 2021-11-15 ENCOUNTER — Other Ambulatory Visit: Payer: Self-pay

## 2021-11-15 NOTE — Patient Outreach (Signed)
[facility]) Care Management  11/15/2021  Roy [name] Feb 12, 1996 [id number]   First telephone outreach attempt to obtain mRS. Phone number no longer associated with patient, unable to leave a message for returned call.  Philmore Pal[facility] Management Assistant [phone]

## 2021-11-17 ENCOUNTER — Other Ambulatory Visit: Payer: Self-pay

## 2021-11-17 NOTE — Patient Outreach (Signed)
[facility]) Care Management ? ?11/17/2021 ? ?Roy [name] ?[birth year] ?[id number] ? ? ?Second telephone outreach attempt to obtain mRS. No answer. Unable to leave message for returned call. ? ?[name] ?[facility]-Care Management Assistant ?[phone] ? ?

## 2021-11-18 ENCOUNTER — Other Ambulatory Visit: Payer: Self-pay

## 2021-11-18 NOTE — Patient Outreach (Signed)
[facility]) Care Management ? ?11/18/2021 ? ?Roy [name] ?02-22-1996 ?[id number] ? ? ?3 outreach attempts were completed to obtain mRs due to patient not having an active phone number and no response from emergency contacts. mRs could not be obtained because patient never returned my calls. mRs=7 ?  ? ?[name] ?Care Management Assistant ?[phone] ? ?

## 2021-11-23 ENCOUNTER — Telehealth: Payer: Self-pay

## 2021-11-23 NOTE — Progress Notes (Unsigned)
[facility] [address] [place]. [place] [address] [phone]       HOSPITAL FOLLOW UP NOTE  [name] Date of Birth:  09-24-1995 Medical Record Number:  [id number]   Reason for Referral:  hospital stroke follow up    SUBJECTIVE:   CHIEF COMPLAINT:  No chief complaint on file.   HPI:   Roy [name] is a 26 year old male with history of obesity who presented on 07/26/2021 after episode unresponsiveness and shaking jerking movements. This symptoms happened after THC and alcohol abuse, and during sexual activity he fell, had left-sided numbness, diaphoresis, headache, nausea vomiting and seizure-like activity.  Personally reviewed hospitalization pertinent progress notes, lab work and imaging.  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  MRI showed multifocal infarcts involving bilateral superior cerebellum, left pons, central midbrain, right thalamus, medial temporal and occipital lobes.  EF 60 to 65%.  LDL 116.  A1c 5.7.  Placed on aspirin and Brilinta post stenting.  EEG showed excessive beta activity but no seizures. Per Dr. Leonie Man, involuntary movements likely myoclonus vs tremors or possibly related to basilar artery occlusion.  Low suspicion for seizures therefore no indication for AED.  Initiate atorvastatin 40 mg daily for HLD.  Tobacco cessation counseling provided.  Other stroke risk factors include THC use, alcohol abuse and obesity.  Residual deficits of moderate dysarthria, left INO, right gaze right eye nystagmus, bilateral upward gaze nystagmus, right and left sided weakness, and decreased sensory on the left.  Per therapy evaluations, discharged to CIR on 08/05/2021.  Patient returned to ED on 09/21/2021 1 minute episode of sudden onset vertigo and nausea.  CTH showed petechial hemorrhagic transformation  of prior right occipital infarct on DAPT s/p recent VA stenting.  Evaluated by Dr. Estanislado Pandy recommended discontinuing Brilinta and continue on aspirin alone.  Continued atorvastatin 40 mg daily.  Evaluated by therapies who recommended outpatient PT and discharged back home on 1/4.           PERTINENT IMAGING  Per hospitalization 09/25/2020 CT head No acute abnormality. CTA head & neck Intimal irregularity with filling defect involving the proximal left vertebral artery, V1 segment, concerning for acute arterial dissection. Subtle irregular nonocclusive filling defect involving the distal basilar artery/basilar tip, consistent with associated distal embolic disease. Adjacent superior cerebellar arteries are not visualized at their origins, and could be occluded. Associated partially occlusive thrombus with severe stenosis involving the left P1 segment. Additional distal right P2 embolic occlusion  IR S/P revascularization of occuded distal basilar artery and both PCAs and with stent assisted angioplasty for symptomatic prox LT VA dissection  Post IR CT  No acute intracranial pathology.  MRI multifocal infarcts involving bilateral superior cerebellum, left pons, central midbrain, right thalamus, medial temporal and occipital lobes  2D Echo EF 60-65%, normal LA, no IA shunt LDL 116 HgbA1c 5.7   Per hospitalization 09/21/2021 CT head - 1.9cm post ischemic petechial hemorrhage in right occipital lobe- stable from previous CTA CTA head & neck- Interval stenting at the proximal left V1 segment at site of previous dissection. Continued interval evolution of previous posterior infarct, new 2.3cm petechial hemorrhage at right occipital lobe.  MRI  expected evolution of scattered posterior circulation infarcts, right occipital HT LDL 116 HgbA1c 5.7       ROS:   14 system review of systems performed and negative with exception of ***  PMH:  Past Medical History:  Diagnosis Date   CVA  (cerebral vascular accident) ([place])     [place]:  Past Surgical History:  Procedure Laterality Date   IR ANGIO EXTRACRAN SEL COM CAROTID INNOMINATE UNI R MOD SED  07/27/2021   IR CT HEAD LTD  07/27/2021   IR INTRA CRAN STENT  07/27/2021   IR PERCUTANEOUS ART THROMBECTOMY/INFUSION INTRACRANIAL INC DIAG ANGIO  07/27/2021   RADIOLOGY WITH ANESTHESIA N/A 07/27/2021   Procedure: IR WITH ANESTHESIA;  Surgeon: Luanne Bras, MD;  Location: [facility];  Service: Radiology;  Laterality: N/A;    Social History:  Social History   Socioeconomic History   Marital status: Single    Spouse name: Not on file   Number of children: Not on file   Years of education: Not on file   Highest education level: Not on file  Occupational History   Not on file  Tobacco Use   Smoking status: Never   Smokeless tobacco: Never  Vaping Use   Vaping Use: Never used  Substance and Sexual Activity   Alcohol use: Not Currently   Drug use: Not Currently   Sexual activity: Not Currently  Other Topics Concern   Not on file  Social History Narrative   Not on file   Social Determinants of Health   Financial Resource Strain: Not on file  Food Insecurity: Not on file  Transportation Needs: Not on file  Physical Activity: Not on file  Stress: Not on file  Social Connections: Not on file  Intimate Partner Violence: Not on file    Family History: No family history on file.  Medications:   Current Outpatient Medications on File Prior to Visit  Medication Sig Dispense Refill   acetaminophen (TYLENOL) 325 MG tablet Take 2 tablets (650 mg total) by mouth every 4 (four) hours as needed for mild pain (or temp > 37.5 C (99.5 F)).     aspirin 81 MG chewable tablet Chew 1 tablet (81 mg total) by mouth daily. (Patient taking differently: Chew 81 mg by mouth at bedtime.) 60 tablet 0   atorvastatin (LIPITOR) 40 MG tablet Take 1 tablet (40 mg total) by mouth at bedtime. 30 tablet 0   folic acid (FOLVITE) 1 MG tablet Take 1  tablet (1 mg total) by mouth daily. 30 tablet 0   melatonin 5 MG TABS Take 1 tablet (5 mg total) by mouth at bedtime. 30 tablet 0   Multiple Vitamin (MULTIVITAMIN WITH MINERALS) TABS tablet Take 1 tablet by mouth daily. 30 tablet 0   polyethylene glycol (MIRALAX / GLYCOLAX) 17 g packet Take 17 g by mouth 2 (two) times daily. (Patient taking differently: Take 17 g by mouth daily as needed for mild constipation.) 14 each 0   QUEtiapine (SEROQUEL) 25 MG tablet Take 1 tablet (25 mg total) by mouth at bedtime as needed (insomnia). 20 tablet 0   sertraline (ZOLOFT) 100 MG tablet Take 1 tablet (100 mg total) by mouth daily. 30 tablet 0   No current facility-administered medications on file prior to visit.    Allergies:  No Known Allergies    OBJECTIVE:  Physical Exam  There were no vitals filed for this visit. There is no height or weight on file to calculate BMI. No results found.  No flowsheet data found.   General: well developed, well nourished, seated, in no evident distress Head: head normocephalic and atraumatic.   Neck: supple with no carotid or supraclavicular bruits Cardiovascular: regular rate and  rhythm, no murmurs Musculoskeletal: no deformity Skin:  no rash/petichiae Vascular:  Normal pulses all extremities   Neurologic Exam Mental Status: Awake and fully alert. Oriented to place and time. Recent and remote memory intact. Attention span, concentration and fund of knowledge appropriate. Mood and affect appropriate.  Cranial Nerves: Fundoscopic exam reveals sharp disc margins. Pupils equal, briskly reactive to light. Extraocular movements full without nystagmus. Visual fields full to confrontation. Hearing intact. Facial sensation intact. Face, tongue, palate moves normally and symmetrically.  Motor: Normal bulk and tone. Normal strength in all tested extremity muscles Sensory.: intact to touch , pinprick , position and vibratory sensation.  Coordination: Rapid alternating  movements normal in all extremities. Finger-to-nose and heel-to-shin performed accurately bilaterally. Gait and Station: Arises from chair without difficulty. Stance is normal. Gait demonstrates normal stride length and balance with ***. Tandem walk and heel toe ***.  Reflexes: 1+ and symmetric. Toes downgoing.     NIHSS  *** Modified Rankin  ***      ASSESSMENT: Roy [name] is a 26 y.o. year old male with scattered posterior circulation infarcts secondary to L VA dissection on 07/26/2021 s/p IR with revascularization and left VA stenting and hemorrhagic transformation of prior right occipital infarct on DAPT (s/p stenting) on 09/21/2021. Vascular risk factors include HLD, tobacco use, THC use and obesity.      PLAN:  Bilateral PCA infarcts R Occipital HT: Residual deficit: ***. Continue aspirin 81 mg daily  and atorvastatin 40 mg daily for secondary stroke prevention.  Discussed secondary stroke prevention measures and importance of close PCP follow up for aggressive stroke risk factor management. I have gone over the pathophysiology of stroke, warning signs and symptoms, risk factors and their management in some detail with instructions to go to the closest emergency room for symptoms of concern. L VA dissection: s/p stenting. Followed by IR HLD: LDL goal <70. Recent LDL 116.  Continue atorvastatin 40 mg daily     Follow up in *** or call earlier if needed   CC:  [facility] provider: Dr. Leonie Man PCP: Pcp, No    I spent *** minutes of face-to-face and non-face-to-face time with patient.  This included previsit chart review including review of recent hospitalization, lab review, study review, order entry, electronic health record documentation, patient education regarding recent stroke including etiology, secondary stroke prevention measures and importance of managing stroke risk factors, residual deficits and typical recovery time and answered all other questions to patient  satisfaction   Frann Rider, AGNP-BC  [facility] [address] [place], [place] [address]  Phone [phone] Fax [phone] Note: This document was prepared with digital dictation and possible smart phrase technology. Any transcriptional errors that result from this process are unintentional.

## 2021-11-23 NOTE — Telephone Encounter (Signed)
Entered in error

## 2021-11-24 ENCOUNTER — Encounter: Payer: Self-pay | Admitting: Adult Health

## 2021-11-24 ENCOUNTER — Inpatient Hospital Stay: Payer: MEDICAID | Admitting: Adult Health

## 2022-01-13 ENCOUNTER — Other Ambulatory Visit (HOSPITAL_COMMUNITY): Payer: Self-pay

## 2022-06-03 ENCOUNTER — Other Ambulatory Visit (HOSPITAL_COMMUNITY): Payer: Self-pay

## 2022-08-08 ENCOUNTER — Other Ambulatory Visit (HOSPITAL_COMMUNITY): Payer: Self-pay

## 2022-10-06 ENCOUNTER — Other Ambulatory Visit (HOSPITAL_COMMUNITY): Payer: Self-pay

## 2023-08-23 ENCOUNTER — Ambulatory Visit
Admission: EM | Admit: 2023-08-23 | Discharge: 2023-08-23 | Disposition: A | Discharge status: Home / Self Care | Payer: Medicaid Other | Attending: Family Medicine | Admitting: Family Medicine

## 2023-08-23 ENCOUNTER — Encounter: Payer: Self-pay | Admitting: *Deleted

## 2023-08-23 ENCOUNTER — Other Ambulatory Visit: Payer: Self-pay

## 2023-08-23 DIAGNOSIS — H00014 Hordeolum externum left upper eyelid: Secondary | ICD-10-CM | POA: Diagnosis not present

## 2023-08-23 MED ORDER — ERYTHROMYCIN 5 MG/GM OP OINT: TOPICAL_OINTMENT | OPHTHALMIC | 0 refills | Status: AC

## 2023-08-23 NOTE — ED Notes (Signed)
Pt reports sty on left upper eyelid x 2-3 weeks. States it has drained some "but not all the way"

## 2023-08-23 NOTE — ED Provider Notes (Signed)
  [facility]   [id number] 08/23/23 Arrival Time: 1914  ASSESSMENT & PLAN:  1. Hordeolum externum of left upper eyelid    Begin: Meds ordered this encounter  Medications   erythromycin ophthalmic ointment    Sig: Place a 1/2 inch ribbon of ointment into the left lower eyelid three times daily for up to one week.    Dispense:  3.5 g    Refill:  0   Ophthalmic ointment per orders. Warm compress to eye(s). Local eye care discussed.   Follow-up Information     [facility]).   Specialty: Urgent Care Why: If worsening or failing to improve as anticipated. Contact information: [address] [place] [place] [address] [phone]                Reviewed expectations re: course of current medical issues. Questions answered. Outlined signs and symptoms indicating need for more acute intervention. Patient verbalized understanding. After Visit Summary given.   SUBJECTIVE:  Roy [name] is a 27 y.o. male who presents with complaint of possible stye to upper L eyelid; gradual onset; x 2 weeks. Warm compresses without relief. Denies eye pain. Denies visual changes. H/O styes.  OBJECTIVE:  Vitals:   08/23/23 1112  BP: 106/68  Pulse: 72  Resp: 18  Temp: 98.1 F (36.7 C)  TempSrc: Oral  SpO2: 99%    General appearance: alert; no distress HEENT: [place]; AT; PERRLA; no restriction of the extraocular movements OS: {without reported pain; without conjunctival injection; without drainage; without corneal opacities; without limbal flush; without periorbital swelling or erythema; does have hordeolum of left upper lid Neck: supple without LAD Psychological: alert and cooperative; normal mood and affect   No Known Allergies  Past Medical History:  Diagnosis Date   CVA (cerebral vascular accident) ([facility])    Social History   Socioeconomic History   Marital status: Single    Spouse name: Not on  file   Number of children: Not on file   Years of education: Not on file   Highest education level: Not on file  Occupational History   Not on file  Tobacco Use   Smoking status: Some Days    Types: Cigarettes   Smokeless tobacco: Never  Vaping Use   Vaping status: Never Used  Substance and Sexual Activity   Alcohol use: Not Currently   Drug use: Not Currently   Sexual activity: Not Currently  Other Topics Concern   Not on file  Social History Narrative   Not on file   Social Determinants of Health   Financial Resource Strain: Not on file  Food Insecurity: Not on file  Transportation Needs: Not on file  Physical Activity: Not on file  Stress: Not on file  Social Connections: Not on file  Intimate Partner Violence: Not on file   History reviewed. No pertinent family history. Past Surgical History:  Procedure Laterality Date   IR ANGIO EXTRACRAN SEL COM CAROTID INNOMINATE UNI R MOD SED  07/27/2021   IR CT HEAD LTD  07/27/2021   IR INTRA CRAN STENT  07/27/2021   IR PERCUTANEOUS ART THROMBECTOMY/INFUSION INTRACRANIAL INC DIAG ANGIO  07/27/2021   RADIOLOGY WITH ANESTHESIA N/A 07/27/2021   Procedure: IR WITH ANESTHESIA;  Surgeon: Julieanne Cotton, MD;  Location: [facility];  Service: Radiology;  Laterality: N/AMardella Layman, MD 08/23/23 1217

## 2023-08-23 NOTE — ED Triage Notes (Signed)
Pt reports sty on left upper eyelid x 2-3 weeks. States it has drained some "but not all the way"

## 2024-03-15 ENCOUNTER — Encounter (HOSPITAL_COMMUNITY): Payer: Self-pay | Admitting: Interventional Radiology
